# Patient Record
Sex: Male | Born: 1965
Health system: Southern US, Community
[De-identification: ages and names within clinical notes are randomized; demographics above are authoritative.]

## PROBLEM LIST (undated history)

## (undated) ENCOUNTER — Emergency Department (HOSPITAL_BASED_OUTPATIENT_CLINIC_OR_DEPARTMENT_OTHER): Admission: EM | Payer: Self-pay | Source: Home / Self Care

## (undated) DIAGNOSIS — K449 Diaphragmatic hernia without obstruction or gangrene: Secondary | ICD-10-CM

## (undated) DIAGNOSIS — K760 Fatty (change of) liver, not elsewhere classified: Secondary | ICD-10-CM

## (undated) DIAGNOSIS — M722 Plantar fascial fibromatosis: Secondary | ICD-10-CM

## (undated) DIAGNOSIS — E559 Vitamin D deficiency, unspecified: Secondary | ICD-10-CM

## (undated) DIAGNOSIS — U071 COVID-19: Secondary | ICD-10-CM

## (undated) DIAGNOSIS — R600 Localized edema: Secondary | ICD-10-CM

## (undated) DIAGNOSIS — R011 Cardiac murmur, unspecified: Secondary | ICD-10-CM

## (undated) DIAGNOSIS — K219 Gastro-esophageal reflux disease without esophagitis: Secondary | ICD-10-CM

## (undated) DIAGNOSIS — N529 Male erectile dysfunction, unspecified: Secondary | ICD-10-CM

## (undated) DIAGNOSIS — E782 Mixed hyperlipidemia: Secondary | ICD-10-CM

## (undated) DIAGNOSIS — R079 Chest pain, unspecified: Secondary | ICD-10-CM

## (undated) DIAGNOSIS — E669 Obesity, unspecified: Secondary | ICD-10-CM

## (undated) DIAGNOSIS — K635 Polyp of colon: Secondary | ICD-10-CM

## (undated) DIAGNOSIS — M779 Enthesopathy, unspecified: Secondary | ICD-10-CM

## (undated) DIAGNOSIS — G4733 Obstructive sleep apnea (adult) (pediatric): Secondary | ICD-10-CM

## (undated) DIAGNOSIS — R739 Hyperglycemia, unspecified: Secondary | ICD-10-CM

## (undated) DIAGNOSIS — R7303 Prediabetes: Secondary | ICD-10-CM

## (undated) DIAGNOSIS — K2100 Gastro-esophageal reflux disease with esophagitis, without bleeding: Secondary | ICD-10-CM

## (undated) DIAGNOSIS — K5790 Diverticulosis of intestine, part unspecified, without perforation or abscess without bleeding: Secondary | ICD-10-CM

## (undated) DIAGNOSIS — R0600 Dyspnea, unspecified: Secondary | ICD-10-CM

## (undated) DIAGNOSIS — I10 Essential (primary) hypertension: Secondary | ICD-10-CM

## (undated) DIAGNOSIS — K259 Gastric ulcer, unspecified as acute or chronic, without hemorrhage or perforation: Secondary | ICD-10-CM

## (undated) DIAGNOSIS — K5900 Constipation, unspecified: Secondary | ICD-10-CM

## (undated) DIAGNOSIS — E1169 Type 2 diabetes mellitus with other specified complication: Secondary | ICD-10-CM

## (undated) DIAGNOSIS — J1282 Pneumonia due to coronavirus disease 2019: Secondary | ICD-10-CM

## (undated) DIAGNOSIS — I509 Heart failure, unspecified: Secondary | ICD-10-CM

## (undated) DIAGNOSIS — M25519 Pain in unspecified shoulder: Secondary | ICD-10-CM

## (undated) DIAGNOSIS — R3911 Hesitancy of micturition: Secondary | ICD-10-CM

## (undated) DIAGNOSIS — M549 Dorsalgia, unspecified: Secondary | ICD-10-CM

## (undated) DIAGNOSIS — K648 Other hemorrhoids: Secondary | ICD-10-CM

## (undated) HISTORY — DX: Hyperglycemia, unspecified: R73.9

## (undated) HISTORY — DX: Dyspnea, unspecified: R06.00

## (undated) HISTORY — DX: Diaphragmatic hernia without obstruction or gangrene: K44.9

## (undated) HISTORY — DX: Polyp of colon: K63.5

## (undated) HISTORY — DX: Obstructive sleep apnea (adult) (pediatric): G47.33

## (undated) HISTORY — DX: Chest pain, unspecified: R07.9

## (undated) HISTORY — DX: Gastro-esophageal reflux disease without esophagitis: K21.9

## (undated) HISTORY — DX: Hesitancy of micturition: R39.11

## (undated) HISTORY — PX: UPPER GASTROINTESTINAL ENDOSCOPY: SHX188

## (undated) HISTORY — DX: Dorsalgia, unspecified: M54.9

## (undated) HISTORY — DX: Type 2 diabetes mellitus with other specified complication: E11.69

## (undated) HISTORY — DX: Prediabetes: R73.03

## (undated) HISTORY — DX: Fatty (change of) liver, not elsewhere classified: K76.0

## (undated) HISTORY — DX: Essential (primary) hypertension: I10

## (undated) HISTORY — DX: Constipation, unspecified: K59.00

## (undated) HISTORY — DX: Plantar fascial fibromatosis: M72.2

## (undated) HISTORY — DX: Pneumonia due to coronavirus disease 2019: J12.82

## (undated) HISTORY — PX: TONSILLECTOMY: SHX5217

## (undated) HISTORY — PX: FOOT TENDON TRANSFER: SHX1671

## (undated) HISTORY — DX: Vitamin D deficiency, unspecified: E55.9

## (undated) HISTORY — DX: Diverticulosis of intestine, part unspecified, without perforation or abscess without bleeding: K57.90

## (undated) HISTORY — DX: Gastro-esophageal reflux disease with esophagitis: K21.0

## (undated) HISTORY — DX: Localized edema: R60.0

## (undated) HISTORY — DX: COVID-19: U07.1

## (undated) HISTORY — DX: Male erectile dysfunction, unspecified: N52.9

## (undated) HISTORY — DX: Enthesopathy, unspecified: M77.9

## (undated) HISTORY — DX: Mixed hyperlipidemia: E78.2

## (undated) HISTORY — PX: UVULECTOMY: SHX2631

## (undated) HISTORY — PX: UMBILICAL HERNIA REPAIR: SHX196

## (undated) HISTORY — DX: Other hemorrhoids: K64.8

## (undated) HISTORY — DX: Obesity, unspecified: E66.9

## (undated) HISTORY — DX: Gastro-esophageal reflux disease with esophagitis, without bleeding: K21.00

## (undated) HISTORY — DX: Pain in unspecified shoulder: M25.519

## (undated) HISTORY — PX: INGUINAL HERNIA REPAIR: SUR1180

---

## 2005-02-01 ENCOUNTER — Ambulatory Visit: Payer: Self-pay | Admitting: Cardiology

## 2005-03-21 ENCOUNTER — Ambulatory Visit: Payer: Self-pay | Admitting: Cardiology

## 2008-01-13 ENCOUNTER — Ambulatory Visit (HOSPITAL_COMMUNITY): Admission: RE | Admit: 2008-01-13 | Discharge: 2008-01-13 | Payer: Self-pay | Admitting: Internal Medicine

## 2008-07-22 ENCOUNTER — Emergency Department (HOSPITAL_COMMUNITY): Admission: EM | Admit: 2008-07-22 | Discharge: 2008-07-22 | Payer: Self-pay | Admitting: Family Medicine

## 2009-10-04 DIAGNOSIS — I10 Essential (primary) hypertension: Secondary | ICD-10-CM | POA: Insufficient documentation

## 2009-10-04 DIAGNOSIS — E785 Hyperlipidemia, unspecified: Secondary | ICD-10-CM | POA: Insufficient documentation

## 2009-10-08 ENCOUNTER — Ambulatory Visit: Payer: Self-pay

## 2009-10-08 ENCOUNTER — Ambulatory Visit (HOSPITAL_COMMUNITY): Admission: RE | Admit: 2009-10-08 | Discharge: 2009-10-08 | Payer: Self-pay | Admitting: Internal Medicine

## 2009-10-08 ENCOUNTER — Ambulatory Visit: Payer: Self-pay | Admitting: Internal Medicine

## 2009-10-08 ENCOUNTER — Encounter: Payer: Self-pay | Admitting: Internal Medicine

## 2009-10-08 DIAGNOSIS — I10 Essential (primary) hypertension: Secondary | ICD-10-CM | POA: Insufficient documentation

## 2009-11-02 ENCOUNTER — Ambulatory Visit: Payer: Self-pay

## 2009-11-02 ENCOUNTER — Ambulatory Visit: Payer: Self-pay | Admitting: Internal Medicine

## 2010-01-05 ENCOUNTER — Ambulatory Visit: Payer: Self-pay | Admitting: Internal Medicine

## 2010-01-05 DIAGNOSIS — E669 Obesity, unspecified: Secondary | ICD-10-CM | POA: Insufficient documentation

## 2010-01-05 DIAGNOSIS — R0789 Other chest pain: Secondary | ICD-10-CM | POA: Insufficient documentation

## 2010-04-25 ENCOUNTER — Ambulatory Visit: Payer: Self-pay | Admitting: Internal Medicine

## 2010-04-25 DIAGNOSIS — M79671 Pain in right foot: Secondary | ICD-10-CM | POA: Insufficient documentation

## 2010-04-25 DIAGNOSIS — M79672 Pain in left foot: Secondary | ICD-10-CM

## 2010-05-06 ENCOUNTER — Telehealth: Payer: Self-pay | Admitting: Internal Medicine

## 2010-06-21 ENCOUNTER — Telehealth: Payer: Self-pay | Admitting: Internal Medicine

## 2010-10-09 LAB — CONVERTED CEMR LAB
ALT: 30 units/L (ref 0–53)
AST: 27 units/L (ref 0–37)
Alkaline Phosphatase: 61 units/L (ref 39–117)
Bilirubin, Direct: 0.1 mg/dL (ref 0.0–0.3)
Calcium: 9.5 mg/dL (ref 8.4–10.5)
GFR calc non Af Amer: 94.43 mL/min (ref >60)
Glucose, Bld: 92 mg/dL (ref 70–99)
LDL Cholesterol: 92 mg/dL (ref 0–99)
Sodium: 141 meq/L (ref 135–145)
Total CHOL/HDL Ratio: 3
VLDL: 12.6 mg/dL (ref 0.0–40.0)

## 2010-10-11 NOTE — Assessment & Plan Note (Signed)
Summary: new to be est, npx- jr   Vital Signs:  Patient profile:   45 year old male Height:      72 inches Weight:      229.50 pounds BMI:     31.24 O2 Sat:      98 % on Room air Temp:     98.5 degrees F oral Pulse rate:   76 / minute Pulse rhythm:   regular Resp:     16 per minute BP sitting:   136 / 90  (right arm) Cuff size:   large  Vitals Entered By: Glendell Docker CMA (January 05, 2010 9:48 AM)  O2 Flow:  Room air CC: Rm 2- New Patient Is Patient Diabetic? No Pain Assessment Patient in pain? yes     Location: chest Intensity: 6 Type: dull Onset of pain  Intermittent Comments Referral from Dr Anne Hahn, previous pcp out of Dr Carollee Massed of [facility]), co intermittent chest pain comes goes,last ov with cardiology about 2 weeks ago- EKG with Treadmill stress test   Primary Care Provider:  Dondra Spry DO  CC:  Rm 2- New Patient.  History of Present Illness: 45 y/o AA male to est.  he was prev followed by  - Dr. Lyn Hollingshead in [place]. he has hx of atypical chest pain he was seen by Dr. Gala Romney cardiac studies reviewed  Left ventricle: The cavity size was normal. Wall thickness was     normal. Systolic function was normal. The estimated ejection     fraction was in the range of 60% to 65%. Wall motion was normal;     there were no regional wall motion abnormalities. Features are     consistent with a pseudonormal left ventricular filling pattern,     with concomitant abnormal relaxation and increased filling pressure     (grade 2 diastolic dysfunction).      exercise stress test - negative  Preventive Screening-Counseling & Management  Alcohol-Tobacco     Alcohol drinks/day: 0     Smoking Status: quit     Packs/Day: <0.25     Year Started: 1987     Year Quit: 1996  Caffeine-Diet-Exercise     Caffeine use/day: 1 cup coffee every other day     Does Patient Exercise: no  Allergies (verified): No Known Drug  Allergies  Past History:  Past Surgical History: Tonsillectomy with uvula removal 2003  Family History: Mother dementia, DM2, HTN, CHF Father unknown Family History of Diabetes: Family History of Hypertension:  Siblings: Hypertension, GERD  No premature CAD colon ca, breast ca, prostate ca - no  Social History: Tobacco Use - No.  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   Head:  normocephalic and atraumatic.   Eyes:  pupils equal, pupils round, and pupils reactive to light.   Ears:  R ear normal and L ear normal.   Mouth:  prev UPPP,  Neck:  supple, no masses, and no carotid bruits.   Lungs:  normal respiratory effort, normal breath sounds, no crackles, and no wheezes.  Heart:  normal rate, regular rhythm, no murmur, and no gallop.   Abdomen:  soft, non-tender, normal bowel sounds, no masses, no hepatomegaly, and no splenomegaly.   Pulses:  dorsalis pedis and posterior tibial pulses are full and equal bilaterally Extremities:  No lower extremity edema  Neurologic:  cranial nerves II-XII intact and gait normal.   Psych:  normally interactive, good eye contact, not anxious appearing, and not depressed appearing.     Impression & Recommendations:  Problem # 1:  CHEST PAIN, ATYPICAL (ICD-786.59) Pt with atypical chest pain.  2 D Echo and treadmill stress test reassuring.  continue risk factor mgt  Problem # 2:  HYPERTENSION, BENIGN (ICD-401.1) Maintain current medication regimen.  screen for OSA  His updated medication list for this problem includes:    Lisinopril-hydrochlorothiazide 20-12.5 Mg Tabs  (Lisinopril-hydrochlorothiazide) .[name]... Take 1 tablet by mouth once a day    Amlodipine Besylate 10 Mg Tabs (Amlodipine besylate) .[name]... Take one tablet by mouth daily  BP today: 136/90 Prior BP: 133/84 (11/02/2009)  Prior 10 Yr Risk Heart Disease: 4 % (11/02/2009)  Labs Reviewed: K+: 3.6 (10/08/2009) Creat: : 1.1 (10/08/2009)   Chol: 149 (10/08/2009)   HDL: 44.30 (10/08/2009)   LDL: 92 (10/08/2009)   TG: 63.0 (10/08/2009)  Problem # 3:  HYPERLIPIDEMIA-MIXED (ICD-272.4) continue statin.  work on wt loss  His updated medication list for this problem includes:    Simvastatin 40 Mg Tabs (Simvastatin) .[name]... Take one tablet by mouth daily at bedtime  Labs Reviewed: SGOT: 27 (10/08/2009)   SGPT: 30 (10/08/2009)  Prior 10 Yr Risk Heart Disease: 4 % (11/02/2009)   HDL:44.30 (10/08/2009)  LDL:92 (10/08/2009)  Chol:149 (10/08/2009)  Trig:63.0 (10/08/2009)  Complete Medication List: 1)  Simvastatin 40 Mg Tabs (Simvastatin) .... Take one tablet by mouth daily at bedtime 2)  Nexium 40 Mg Cpdr (Esomeprazole magnesium) .... Take 1 capsule by mouth once a day 3)  Lisinopril-hydrochlorothiazide 20-12.5 Mg Tabs (Lisinopril-hydrochlorothiazide) .... Take 1 tablet by mouth once a day 4)  Amlodipine Besylate 10 Mg Tabs (Amlodipine besylate) .... Take one tablet by mouth daily 5)  Cialis 20 Mg Tabs (Tadalafil) .... Take 1 tablet by mouth once a day as needed 6)  Pennsaid 1.5 % Soln (Diclofenac sodium) .... Apply 10 drops two times a day  Patient Instructions: 1)  Please schedule a follow-up appointment in 3 months. 2)  Avoid concentrated sweets 3)  Avoid fruit juices and soft drinks 4)  Limit your carbohydrate intake to 30 grams per meal (100 grams per day) 5)  Take fish oil (omega 3 fatty acid) supplement daily (stop if it makes your heartburn worse) 6)  Start daily walking program if you can 7)  BMP prior to visit, ICD-9: 401.9 8)  HbgA1C prior to visit, ICD-9: 278.00 9)  Please return for lab  work one (1) week before your next appointment.  Prescriptions: CIALIS 20 MG TABS (TADALAFIL) Take 1 tablet by mouth once a day as needed  #10 x 2   Entered and Authorized by:   D. Thomos Lemons DO   Signed by:   D. Thomos Lemons DO on 01/05/2010   Method used:   Electronically to        [facility]* (retail)       [address]       [address] Shipping/mailing       [place], [place]  [address]       Ph: [phone]  Fax: [phone]   RxID:   [id number] AMLODIPINE BESYLATE 10 MG TABS (AMLODIPINE BESYLATE) Take one tablet by mouth daily  #90 x 1   Entered and Authorized by:   D. Thomos Lemons DO   Signed by:   D. Thomos Lemons DO on 01/05/2010   Method used:   Electronically to        [facility]* (retail)       [address]       [address] [place] Shipping/mailing       [place], [place]  [address]       Ph: [phone]       Fax: [phone]   RxID:   [id number] LISINOPRIL-HYDROCHLOROTHIAZIDE 20-12.5 MG TABS (LISINOPRIL-HYDROCHLOROTHIAZIDE) Take 1 tablet by mouth once a day  #90 x 1   Entered and Authorized by:   D. Thomos Lemons DO   Signed by:   D. Thomos Lemons DO on 01/05/2010   Method used:   Electronically to        [facility]* (retail)       [address]       [address] Shipping/mailing       [place], [place]  [address]       Ph: [phone]       Fax: [phone]   RxID:   [id number] NEXIUM 40 MG CPDR (ESOMEPRAZOLE MAGNESIUM) Take 1 capsule by mouth once a day  #90 x 1   Entered and Authorized by:   D. Thomos Lemons DO   Signed by:   D. Thomos Lemons DO on 01/05/2010   Method used:   Electronically to        [facility]* (retail)       [address]       [address] Shipping/mailing       [place], [place]  [address]       Ph: [phone]       Fax: [phone]   RxID:   [id number] SIMVASTATIN 40 MG TABS (SIMVASTATIN) Take one tablet by mouth daily at bedtime  #90 x 1    Entered and Authorized by:   D. Thomos Lemons DO   Signed by:   D. Thomos Lemons DO on 01/05/2010   Method used:   Electronically to        [facility]* (retail)       [address]       [address] [place] Shipping/mailing       [place], [place]  [address]       Ph: [phone]       Fax: [phone]   RxID:   [id number]    Immunization History:  Tetanus/Td Immunization History:    Tetanus/Td:  historical (01/01/2007)  Influenza Immunization History:    Influenza:  declined (01/05/2010)     Current Allergies (reviewed today): No known allergies

## 2010-10-11 NOTE — Progress Notes (Signed)
Summary: Voltaren Clarification  Phone Note From Pharmacy   Caller: [facility]* Call For: Dr Artist Pais  Summary of Call: fax received from [facility] pharmacy for clarification on Voltaren Gel. They are wanting to know if a solution was meant to be prescribed for the patient Initial call taken by: Glendell Docker CMA,  May 06, 2010 8:17 AM  Follow-up for Phone Call        rx was suppose to be for voltaren gel Follow-up by: D. Thomos Lemons DO,  May 06, 2010 5:10 PM  Additional Follow-up for Phone Call Additional follow up Details #1::        Notified pharmacist rx should have been for the gel. Nicki Guadalajara Fergerson CMA Duncan Dull)  May 09, 2010 1:26 PM

## 2010-10-11 NOTE — Progress Notes (Signed)
Summary: Ibuprofen Refill  Phone Note Refill Request Message from:  Fax from Pharmacy on June 21, 2010 11:05 AM  Refills Requested: Medication #1:  ibuprofen 800 mg tablet   Brand Name Necessary? No   Supply Requested: 3 months   Last Refilled: 09/24/2008  Method Requested: Electronic Next Appointment Scheduled: none Initial call taken by: Roselle Locus,  June 21, 2010 11:05 AM  Follow-up for Phone Call        call placed to patient at [phone], voice recording reached stating" the person you are trying to reach has exceeded their voice message boxcapacity, please try your call again later" Glendell Docker [facility]  June 21, 2010 11:33 AM   Additional Follow-up for Phone Call Additional follow up Details #1::        attempted to contact patient at  [phone], no answer voice recording reached with previous message repeated. call placed to pharmacy [phone] at [facility], spoke with pharmacist Thayer Ohm he was advised the rx has been denied to have patient contact office. Additional Follow-up by: Glendell Docker CMA,  June 22, 2010 8:07 AM    Additional Follow-up for Phone Call Additional follow up Details #2::    no return call from patient regardigng rx request Follow-up by: Glendell Docker CMA,  June 23, 2010 8:32 AM

## 2010-10-11 NOTE — Assessment & Plan Note (Signed)
Summary: OK PER HEATHER/D.MILLER   Primary Provider:  Carollee Massed, [place]   History of Present Illness: 45 y/o male with HTN, HL, OSA s/p UPPP. Presents for evaluation of CP.  Very active. No known h/o heart disease. No previous stress test or cath, Over past month has had episdoes of central chest pain/pressure. mostly while driving. no associated symptoms. Not worse with exercise or movement. Has also noticed lump under his left breast but pain not associated with this.  No orthopnea, PND or HF symptoms. Continues to snore. not exercising routinely currently.  Had echo today EF 60% no RWMA. grade 2 diastolic dysfunction. (which I read personally)  Current Medications (verified): 1)  Metoprolol Succinate 50 Mg Xr24h-Tab (Metoprolol Succinate) .... Take One Tablet By Mouth Daily 2)  Vytorin 10-20 Mg Tabs (Ezetimibe-Simvastatin) .... Take One Tablet By Mouth Daily At Bedtime 3)  Nexium 40 Mg Cpdr (Esomeprazole Magnesium) .... Once Daily 4)  Lisinopril-Hydrochlorothiazide 20-12.5 Mg Tabs (Lisinopril-Hydrochlorothiazide) .... Once Daily 5)  Phentermine Hcl 37.5 Mg Tabs (Phentermine Hcl) .... As Needed  Allergies (verified): No Known Drug Allergies  Past History:  Family History: Last updated: 10/08/2009 Mother dementia, DM2, HTN, CHF Father unknown Family History of Diabetes:  Family History of Hypertension:  Siblings: Hypertension, GERD  No premature CAD  Social History: Last updated: 10/04/2009 Tobacco Use - No.  Full Time Married  Alcohol Use - no Regular Exercise - yes Drug Use - no  Risk Factors: Exercise: yes (10/04/2009)  Risk Factors: Smoking Status: never (10/04/2009)  Past Medical History: 1. HYPERLIPIDEMIA-MIXED   2. HYPERTENSION, UNSPECIFIED  3. hernia 4. bone spurs (L foot) 5. OSA s/p UPPP  Family History: Reviewed history from 10/04/2009 and no changes required. Mother dementia, DM2, HTN, CHF Father unknown Family History of  Diabetes:  Family History of Hypertension:  Siblings: Hypertension, GERD  No premature CAD  Social History: Reviewed history from 10/04/2009 and no changes required. Tobacco Use - No.  Full Time Married  Alcohol Use - no Regular Exercise - yes Drug Use - no  Review of Systems       As per HPI and past medical history; otherwise all systems negative.   Vital Signs:  Patient profile:   45 year old male Height:      72 inches Weight:      234 pounds BMI:     31.85 Pulse rate:   54 / minute BP sitting:   162 / 98  (right arm) Cuff size:   large  Vitals Entered By: Hardin Negus, RMA (October 08, 2009 9:15 AM)  Physical Exam  General:  Gen: well appearing. no resp difficulty HEENT: normal Neck: supple. no JVD. Carotids 2+ bilat; no bruits. No lymphadenopathy or thryomegaly appreciated. Cor: PMI nondisplaced. Regular rate & rhythm. No rubs, gallops, murmur. Chest wall: subcutaneous linear mobile nodule on left breast Lungs: clear Abdomen: soft, nontender, nondistended. No hepatosplenomegaly. No bruits or masses. Good bowel sounds. Extremities: no cyanosis, clubbing, rash, edema Neuro: alert & orientedx3, cranial nerves grossly intact. moves all 4 extremities w/o difficulty. affect pleasant    Impression & Recommendations:  Problem # 1:  CHEST TIGHTNESS-PRESSURE-OTHER ([id number]) Typical and atypical features. + risk factors. will proceed with treadmill test,  Problem # 2:  HYPERTENSION, BENIGN (ICD-401.1) Significantly elevated Will add norvasc 5mg  per day. Titrate to 10 in 2 weeks. Keep BP log. Weight loss with diet and exercise. Eventually would like to d/c metoprolol and HCTZ in favor of spironolactone.   Problem #  3:  HYPERLIPIDEMIA-MIXED (ICD-272.4) Wants to stop vytorin due to cost. Will switch to simva 40. Check lipids today.  Problem # 4:  Chest nodule Have discussed with radiology, they suggest CT to further evlauate.  Other Orders: EKG w/  Interpretation (93000) TLB-BMP (Basic Metabolic Panel-BMET) (80048-METABOL) TLB-Hepatic/Liver Function Pnl (80076-HEPATIC) TLB-Lipid Panel (80061-LIPID) CT Scan  (CT Scan) Treadmill (Treadmill)  Patient Instructions: 1)  Stop Vytorin 2)  Start Simvastatin 40mg  daily 3)  Start Amlodipine 5mg  daily 4)  Labs today 5)  Your physician has requested that you have an exercise tolerance test.  For further information please visit [email or web address]  Please also follow instruction sheet, as given. 6)  Non-Cardiac CT scanning, (CAT scanning), is a noninvasive, special x-ray that produces cross-sectional images of the body using x-rays and a computer. CT scans help physicians diagnose and treat medical conditions. For some CT exams, a contrast material is used to enhance visibility in the area of the body being studied. CT scans provide greater clarity and reveal more details than regular x-ray exams. 7)  Follow up in 1 month Prescriptions: AMLODIPINE BESYLATE 5 MG TABS (AMLODIPINE BESYLATE) Take one tablet by mouth daily  #30 x 6   Entered by:   Meredith Staggers, RN   Authorized by:   Dolores Patty, MD, [facility]   Signed by:   Meredith Staggers, RN on 10/08/2009   Method used:   Electronically to        [facility]* (retail)       [address]       [address] Shipping/mailing       [place], [place]  [address]       Ph: [phone]       Fax: [phone]   RxID:   [id number] SIMVASTATIN 40 MG TABS (SIMVASTATIN) Take one tablet by mouth daily at bedtime  #30 x 6   Entered by:   Meredith Staggers, RN   Authorized by:   Dolores Patty, MD, [facility]   Signed by:   Meredith Staggers, RN on 10/08/2009   Method used:   Electronically to        [facility]* (retail)       [address]       [address] Shipping/mailing       [place], [place]  [address]       Ph: [phone]       Fax: [phone]   RxID:   [id number]

## 2010-10-11 NOTE — Assessment & Plan Note (Signed)
Summary: foot pain/mhf   Vital Signs:  Patient profile:   46 year old male Weight:      228.25 pounds BMI:     31.07 O2 Sat:      98 % on Room air Temp:     98.3 degrees F oral Pulse rate:   59 / minute Pulse rhythm:   regular Resp:     16 per minute BP sitting:   122 / 82  (right arm) Cuff size:   large  Vitals Entered By: Glendell Docker CMA (April 25, 2010 1:08 PM)  O2 Flow:  Room air CC: Rt Foot Pain Is Patient Diabetic? No Pain Assessment Patient in pain? yes     Location: foot Intensity: 8 Type: sharp &=Stabbing Comments c/ o rogh tfoot sharp stabbing sensation for the past 2 weeks, requesting a rx for 800 mg Ibuprofen   Primary Care Andra Matsuo:  Dondra Spry DO  CC:  Rt Foot Pain.  History of Present Illness: 45 y/o AA male c/o significant right heel pain 2 yrs of on and off right heel pain.   prev eval at urgent care. no improvement with PT / stretching no recent injury pain with walking  Preventive Screening-Counseling & Management  Alcohol-Tobacco     Smoking Status: quit  Allergies (verified): No Known Drug Allergies  Past History:  Past Medical History: 1. HYPERLIPIDEMIA-MIXED   2. HYPERTENSION, UNSPECIFIED  3. hernia 4. bone spurs (L foot)  5. OSA s/p UPPP  Physical Exam  General:  alert, well-developed, and well-nourished.   Lungs:  normal respiratory effort, normal breath sounds, no crackles, and no wheezes.   Heart:  normal rate, regular rhythm, no murmur, and no gallop.   Msk:  tenderness of achilles tendon - calcaneus no redness   Impression & Recommendations:  Problem # 1:  HEEL PAIN, RIGHT (ICD-729.5) 45 y/o with exacerbation of right heel pain.  prob achilles tendinitis vs partial tear.  refer to Dr Victorino Dike for further eval and tx pt advised to avoid strenous activity use voltaren gel Orders: Orthopedic Referral (Ortho)  Complete Medication List: 1)  Simvastatin 40 Mg Tabs (Simvastatin) .... Take one tablet by mouth  daily at bedtime 2)  Nexium 40 Mg Cpdr (Esomeprazole magnesium) .... Take 1 capsule by mouth once a day 3)  Lisinopril-hydrochlorothiazide 20-12.5 Mg Tabs (Lisinopril-hydrochlorothiazide) .... Take 1 tablet by mouth once a day 4)  Amlodipine Besylate 10 Mg Tabs (Amlodipine besylate) .... Take one tablet by mouth daily 5)  Cialis 20 Mg Tabs (Tadalafil) .... Take 1 tablet by mouth once a day as needed 6)  Voltaren 0.1 % Soln (Diclofenac sodium) .... Apply 2 grams to right ankle qid  Patient Instructions: 1)  Our office will contact you re:  orthopedic referral Prescriptions: VOLTAREN 0.1 % SOLN (DICLOFENAC SODIUM) apply 2 grams to right ankle qid  #3 pk x 1   Entered and Authorized by:   D. Thomos Lemons DO   Signed by:   D. Thomos Lemons DO on 04/25/2010   Method used:   Electronically to        [facility]* (retail)       [address]       [address] Shipping/mailing       [place], [place]  [address]       Ph: [phone]       Fax: [phone]   RxID:   [id number]   Current Allergies (reviewed today): No known allergies

## 2010-12-02 ENCOUNTER — Telehealth: Payer: Self-pay | Admitting: Internal Medicine

## 2010-12-02 MED ORDER — SIMVASTATIN 40 MG PO TABS: 40.0000 mg | ORAL_TABLET | Freq: Every day | ORAL | Status: DC

## 2010-12-02 MED ORDER — LISINOPRIL-HYDROCHLOROTHIAZIDE 20-12.5 MG PO TABS: 1.0000 | ORAL_TABLET | Freq: Every day | ORAL | Status: DC

## 2010-12-02 MED ORDER — ESOMEPRAZOLE MAGNESIUM 40 MG PO CPDR: 40.0000 mg | DELAYED_RELEASE_CAPSULE | Freq: Every day | ORAL | Status: DC

## 2010-12-02 NOTE — Telephone Encounter (Addendum)
OK for 1 refill.   Needs following labs before OV BMET 401.9  FLP, LFTs 272.4

## 2010-12-02 NOTE — Telephone Encounter (Signed)
Attempted to reach pt and schedule appointment but voicemail is full. Unable to leave message. 30 day supply only, sent to pharmacy.

## 2010-12-02 NOTE — Telephone Encounter (Signed)
Pt wife called stating that pt needs refills on nexium, lisinopril, and simvastatin. Please assist.

## 2010-12-02 NOTE — Telephone Encounter (Signed)
Previous refills printed in office and did not go through electronically. Called pharmacy and left refills on voicemail.

## 2010-12-05 ENCOUNTER — Encounter: Payer: Self-pay | Admitting: *Deleted

## 2010-12-05 NOTE — Telephone Encounter (Signed)
Attempted to reach pt re: need for labs and appt. Voice mailbox is full, unable to leave message. Contact letter mailed to pt.

## 2010-12-31 ENCOUNTER — Encounter: Payer: Self-pay | Admitting: Internal Medicine

## 2011-01-04 ENCOUNTER — Encounter: Payer: Self-pay | Admitting: Internal Medicine

## 2011-01-04 ENCOUNTER — Ambulatory Visit (INDEPENDENT_AMBULATORY_CARE_PROVIDER_SITE_OTHER): Payer: 59 | Admitting: Internal Medicine

## 2011-01-04 VITALS — BP 132/82 | HR 74 | Resp 18 | Ht 72.0 in | Wt 240.0 lb

## 2011-01-04 DIAGNOSIS — E785 Hyperlipidemia, unspecified: Secondary | ICD-10-CM

## 2011-01-04 DIAGNOSIS — Z125 Encounter for screening for malignant neoplasm of prostate: Secondary | ICD-10-CM

## 2011-01-04 DIAGNOSIS — Z0000 Encounter for general adult medical examination without abnormal findings: Secondary | ICD-10-CM

## 2011-01-04 DIAGNOSIS — I10 Essential (primary) hypertension: Secondary | ICD-10-CM

## 2011-01-04 MED ORDER — AMLODIPINE BESYLATE 10 MG PO TABS: 10.0000 mg | ORAL_TABLET | Freq: Every day | ORAL | Status: DC

## 2011-01-04 MED ORDER — LISINOPRIL-HYDROCHLOROTHIAZIDE 20-12.5 MG PO TABS: 1.0000 | ORAL_TABLET | Freq: Every day | ORAL | Status: DC

## 2011-01-04 MED ORDER — SIMVASTATIN 10 MG PO TABS: 10.0000 mg | ORAL_TABLET | Freq: Every day | ORAL | Status: DC

## 2011-01-04 NOTE — Assessment & Plan Note (Signed)
BP looks good. If trending up can double lisinopril/HCTZ. Continue exercise program. Agree with weight loss efforts. Discussed [facility] Diet and prtion control.

## 2011-01-04 NOTE — Patient Instructions (Signed)
Goal weight loss 20-30 lbs  [email or web address] Our office will contact you re: blood test results Avoid concentrated sweets and decrease your intake of carbohydrates

## 2011-01-04 NOTE — Progress Notes (Signed)
Subjective:    Patient ID: [name] Foster, male    DOB: 1966-07-09, 45 y.o.   MRN: [id number]  HPI  45 y/o male for routine cpx and follow up No significant interval hx  Hyperlipidemia - pt ran out of cholesterol medication  htn - stable   Review of Systems  Constitutional: weight gain  Eyes: Negative for visual disturbance.  Respiratory: Negative for cough, chest tightness and shortness of breath.   Cardiovascular: Negative for chest pain.  Genitourinary: Negative for difficulty urinating.  Neurological: Negative for headaches.  Gastrointestinal: Negative for abdominal pain, heartburn melena or hematochezia Psych: Negative for depression or anxiety Endo:  No polyuria or polydypsia        Past Medical History  Diagnosis Date  . Mixed hyperlipidemia   . Hypertension   . Bone spur     left foot  . OSA (obstructive sleep apnea)     s/p UPPP    History   Social History  . Marital Status: Married    Spouse Name: N/A    Number of Children: N/A  . Years of Education: N/A   Occupational History  . Not on file.   Social History Main Topics  . Smoking status: Former Games developer  . Smokeless tobacco: Not on file  . Alcohol Use: No  . Drug Use: Not on file  . Sexually Active: Not on file   Other Topics Concern  . Not on file   Social History Narrative   Tobacco Use - No. 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 Tonsillectomy     Family History  Problem Relation Age of Onset  . Dementia Mother   . Diabetes Mother   . Hypertension Mother   . Heart failure Mother   . Other Father     unknown  . Hypertension      siblings  . Other      no premature cad, colon ca, breast ca, or prostate ca    No  Known Allergies  Current Outpatient Prescriptions on File Prior to Visit  Medication Sig Dispense Refill  . amLODipine (NORVASC) 10 MG tablet Take 10 mg by mouth daily.        . diclofenac sodium (VOLTAREN) 1 % GEL Apply 1 application topically. Apply 2 grams to right ankle four times daily       . esomeprazole (NEXIUM) 40 MG capsule Take 1 capsule (40 mg total) by mouth daily.  30 capsule  0  . lisinopril-hydrochlorothiazide (ZESTORETIC) 20-12.5 MG per tablet Take 1 tablet by mouth daily.  30 tablet  0  . simvastatin (ZOCOR) 40 MG tablet Take 1 tablet (40 mg total) by mouth daily.  30 tablet  0  . tadalafil (CIALIS) 20 MG tablet Take 20 mg by mouth daily as needed.          BP 124/90  Pulse 67  Temp(Src) 97.9 F (36.6 C) (Oral)  Resp 18  Ht 6' (1.829 m)  Wt 239 lb (108.41 kg)  BMI 32.41 kg/m2  SpO2 100%    Objective:   Physical Exam     Constitutional: Appears well-developed and well-nourished. No distress.  HENT:  Head: Normocephalic and atraumatic.  Right Ear: External  ear normal.  Left Ear: External ear normal.  Mouth/Throat: Oropharynx is clear and moist. prev UPPP Eyes: Conjunctivae are normal. Pupils are equal, round, and reactive to light.  Neck: Normal range of motion. Neck supple. No thyromegaly present.       No carotid bruit  Cardiovascular: Normal rate, regular rhythm and normal heart sounds.  Exam reveals no gallop and no friction rub.   No murmur heard. Pulmonary/Chest: Effort normal and breath sounds normal.  No wheezes. No rales.  Abdominal: Soft. Bowel sounds are normal. No mass. There is no tenderness.  Neurological: Alert. No cranial nerve deficit.  Skin: Skin is warm and dry.  Psychiatric: Normal mood and affect. Behavior is normal.      Assessment & Plan:

## 2011-01-04 NOTE — Progress Notes (Signed)
HPI:  [name] Foster is a 45 y/o male (husband of Sharonda in our office) with HTN, HL, OSA s/p UPPP.   We saw him last year for evaluation for CP. Echo showed EF 60%. No RWMA. grade 2 diastolic dysfunction. ETT was normal 2/11 (12:00 on Bruce)  Returns for routine f/u.  Doing well. Going to gym regularly 4x/week. Doing 30-60 mins. @ 7-12% grade. HR 145-150. No CP or undue dyspnea.   Saw Dr. Artist Pais this am and weight loss recommended. Lipids checked today.    ROS: All systems negative except as listed in HPI, PMH and Problem List.  Past Medical History  Diagnosis Date  . Mixed hyperlipidemia   . Hypertension   . Bone spur     left foot  . OSA (obstructive sleep apnea)     s/p UPPP    Current Outpatient Prescriptions  Medication Sig Dispense Refill  . amLODipine (NORVASC) 10 MG tablet Take 1 tablet (10 mg total) by mouth daily.  90 tablet  1  . esomeprazole (NEXIUM) 40 MG capsule Take 1 capsule (40 mg total) by mouth daily.  30 capsule  0  . lisinopril-hydrochlorothiazide (ZESTORETIC) 20-12.5 MG per tablet Take 1 tablet by mouth daily.  90 tablet  1  . simvastatin (ZOCOR) 10 MG tablet Take 1 tablet (10 mg total) by mouth daily.  90 tablet  1  . tadalafil (CIALIS) 20 MG tablet Take 20 mg by mouth daily as needed.        [name] DISCONTD: amLODipine (NORVASC) 10 MG tablet Take 10 mg by mouth daily.        [name] DISCONTD: diclofenac sodium (VOLTAREN) 1 % GEL Apply 1 application topically. Apply 2 grams to right ankle four times daily       . DISCONTD: lisinopril-hydrochlorothiazide (ZESTORETIC) 20-12.5 MG per tablet Take 1 tablet by mouth daily.  30 tablet  0  . DISCONTD: simvastatin (ZOCOR) 40 MG tablet Take 1 tablet (40 mg total) by mouth daily.  30 tablet  0     PHYSICAL EXAM: Filed Vitals:   01/04/11 1126  BP: 138/88  Pulse: 74  Resp: 18   General:  Well appearing. Muscular No resp difficulty HEENT: normal Neck: supple. JVP flat. Carotids 2+ bilaterally; no bruits. No lymphadenopathy or  thryomegaly appreciated. Cor: PMI normal. Regular rate & rhythm. No rubs, gallops or murmurs. Lungs: clear Abdomen: soft, nontender, nondistended. No hepatosplenomegaly. No bruits or masses. Good bowel sounds. Extremities: no cyanosis, clubbing, rash, edema Neuro: alert & orientedx3, cranial nerves grossly intact. Moves all 4 extremities w/o difficulty. Affect pleasant.    ECG: Sinus arrhythmia 65 No ST-T wave abnormalities. 1AVB.     ASSESSMENT & PLAN:

## 2011-01-04 NOTE — Assessment & Plan Note (Signed)
Followed by Dr. Artist Pais. Continue simvastatin. Goal LDL < 100.

## 2011-01-05 LAB — BASIC METABOLIC PANEL WITH GFR
Calcium: 10.2 mg/dL (ref 8.4–10.5)
Creat: 0.98 mg/dL (ref 0.40–1.50)
GFR, Est African American: >60 mL/min (ref >60)
GFR, Est Non African American: >60 mL/min (ref >60)

## 2011-01-05 LAB — LIPID PANEL
HDL: 40 mg/dL (ref >39)
LDL Cholesterol: 181 mg/dL — ABNORMAL HIGH (ref 0–99)

## 2011-01-05 LAB — HEMOGLOBIN A1C: Hgb A1c MFr Bld: 5.7 % — ABNORMAL HIGH (ref <5.7)

## 2011-01-05 LAB — PSA: PSA: 2.21 ng/mL (ref <=4.00)

## 2011-01-05 LAB — HIGH SENSITIVITY CRP: CRP, High Sensitivity: 2.6 mg/L

## 2011-01-09 ENCOUNTER — Encounter: Payer: Self-pay | Admitting: *Deleted

## 2011-01-25 DIAGNOSIS — Z0000 Encounter for general adult medical examination without abnormal findings: Secondary | ICD-10-CM | POA: Insufficient documentation

## 2011-01-25 NOTE — Assessment & Plan Note (Signed)
Reviewed adult health maintenance protocols.  We discussed colon cancer screening starting at age 43. Pt uptodate with Tdap

## 2011-01-25 NOTE — Assessment & Plan Note (Signed)
BP: 124/90 mmHg  Stable.  Continue current medication regimen. Lab Results  Component Value Date   CREATININE 0.98 01/04/2011

## 2011-01-25 NOTE — Assessment & Plan Note (Signed)
Deteriorated. I urged medication and dietary compliance Lab Results  Component Value Date   CHOL 247* 01/04/2011   CHOL 149 10/08/2009   Lab Results  Component Value Date   HDL 40 01/04/2011   HDL 44.30 10/08/2009   Lab Results  Component Value Date   LDLCALC 181* 01/04/2011   LDLCALC 92 10/08/2009   Lab Results  Component Value Date   TRIG 131 01/04/2011   TRIG 63.0 10/08/2009   Lab Results  Component Value Date   CHOLHDL 6.2 01/04/2011   CHOLHDL 3 10/08/2009   No results found for this basename: LDLDIRECT

## 2011-04-05 ENCOUNTER — Ambulatory Visit: Payer: 59 | Admitting: Internal Medicine

## 2011-04-29 ENCOUNTER — Emergency Department (HOSPITAL_BASED_OUTPATIENT_CLINIC_OR_DEPARTMENT_OTHER)
Admission: EM | Admit: 2011-04-29 | Discharge: 2011-04-29 | Disposition: A | Discharge status: Home / Self Care | Payer: 59 | Attending: Emergency Medicine | Admitting: Emergency Medicine

## 2011-04-29 ENCOUNTER — Other Ambulatory Visit: Payer: Self-pay

## 2011-04-29 ENCOUNTER — Encounter (HOSPITAL_BASED_OUTPATIENT_CLINIC_OR_DEPARTMENT_OTHER): Payer: Self-pay | Admitting: *Deleted

## 2011-04-29 ENCOUNTER — Emergency Department (INDEPENDENT_AMBULATORY_CARE_PROVIDER_SITE_OTHER): Payer: 59

## 2011-04-29 DIAGNOSIS — I10 Essential (primary) hypertension: Secondary | ICD-10-CM | POA: Insufficient documentation

## 2011-04-29 DIAGNOSIS — M546 Pain in thoracic spine: Secondary | ICD-10-CM | POA: Insufficient documentation

## 2011-04-29 DIAGNOSIS — R079 Chest pain, unspecified: Secondary | ICD-10-CM | POA: Insufficient documentation

## 2011-04-29 DIAGNOSIS — S30860A Insect bite (nonvenomous) of lower back and pelvis, initial encounter: Secondary | ICD-10-CM | POA: Insufficient documentation

## 2011-04-29 DIAGNOSIS — W57XXXA Bitten or stung by nonvenomous insect and other nonvenomous arthropods, initial encounter: Secondary | ICD-10-CM

## 2011-04-29 DIAGNOSIS — K219 Gastro-esophageal reflux disease without esophagitis: Secondary | ICD-10-CM | POA: Insufficient documentation

## 2011-04-29 DIAGNOSIS — E785 Hyperlipidemia, unspecified: Secondary | ICD-10-CM | POA: Insufficient documentation

## 2011-04-29 DIAGNOSIS — T148 Other injury of unspecified body region: Secondary | ICD-10-CM

## 2011-04-29 DIAGNOSIS — G4733 Obstructive sleep apnea (adult) (pediatric): Secondary | ICD-10-CM | POA: Insufficient documentation

## 2011-04-29 HISTORY — DX: Gastric ulcer, unspecified as acute or chronic, without hemorrhage or perforation: K25.9

## 2011-04-29 HISTORY — DX: Cardiac murmur, unspecified: R01.1

## 2011-04-29 LAB — BASIC METABOLIC PANEL
Calcium: 10.4 mg/dL (ref 8.4–10.5)
Chloride: 102 mEq/L (ref 96–112)
Creatinine, Ser: 1 mg/dL (ref 0.50–1.35)
GFR calc Af Amer: >60 mL/min (ref >60)
GFR calc non Af Amer: >60 mL/min (ref >60)

## 2011-04-29 LAB — DIFFERENTIAL
Basophils Absolute: 0 10*3/uL (ref 0.0–0.1)
Basophils Relative: 0 % (ref 0–1)
Eosinophils Absolute: 0 10*3/uL (ref 0.0–0.7)
Monocytes Absolute: 0.4 10*3/uL (ref 0.1–1.0)
Neutro Abs: 2.1 10*3/uL (ref 1.7–7.7)

## 2011-04-29 LAB — CBC
HCT: 43.7 % (ref 39.0–52.0)
MCH: 28.8 pg (ref 26.0–34.0)
MCHC: 35.2 g/dL (ref 30.0–36.0)
RDW: 13.5 % (ref 11.5–15.5)

## 2011-04-29 LAB — TROPONIN I: Troponin I: <0.3 ng/mL (ref <0.30)

## 2011-04-29 LAB — CK TOTAL AND CKMB (NOT AT ARMC): Relative Index: 0.6 (ref 0.0–2.5)

## 2011-04-29 NOTE — ED Notes (Signed)
No old EKG on the patient. NP Rubin Payor was told of this when shown the new EKG.

## 2011-04-29 NOTE — ED Notes (Signed)
Patient states he was bitten by an unknown insect approximately one week ago.  Has treated with rubbing alcohol.  States bites were initially the size of a dime.  Minimal redness at site, itchy.  States approximately 3 days ago he developed chest aching , feels like a chest cold, denies any recent illness except insect bites.

## 2011-04-29 NOTE — ED Provider Notes (Signed)
History     CSN: [id number] Arrival date & time: 04/29/2011 11:27 AM  Chief Complaint  Patient presents with  . Insect Bite    Chest discomfort after being bite by insects.     HPI Comments: Pt state that it all started after he was laying on the ground working on a car and got bitten multiple times by some insect:pt state that the bites were swollen and red, but have gotten better after using rubbing etoh  Patient is a 45 y.o. male presenting with chest pain. The history is provided by the patient. No language interpreter was used.  Chest Pain The chest pain began 3 - 5 days ago. Chest pain occurs constantly. The chest pain is unchanged. At its most intense, the pain is at 4/10. The pain is currently at 1/10. The severity of the pain is mild. The quality of the pain is described as aching. The pain radiates to the upper back. Primary symptoms include shortness of breath and dizziness. Pertinent negatives for primary symptoms include no fever, no fatigue, no syncope, no wheezing, no palpitations, no abdominal pain, no nausea and no vomiting.  Dizziness does not occur with nausea, vomiting, weakness or diaphoresis.   Pertinent negatives for associated symptoms include no diaphoresis, no lower extremity edema, no near-syncope, no orthopnea, no paroxysmal nocturnal dyspnea and no weakness. He tried nothing for the symptoms. Risk factors include male gender.  Pertinent negatives for past medical history include no hyperlipidemia and no hypertension.     Past Medical History  Diagnosis Date  . Mixed hyperlipidemia   . Hypertension   . Bone spur     left foot  . OSA (obstructive sleep apnea)     s/p UPPP  . Murmur   . Acid reflux   . Stomach ulcer     Past Surgical History  Procedure Date  . Tonsillectomy     Family History  Problem Relation Age of Onset  . Dementia Mother   . Diabetes Mother   . Hypertension Mother   . Heart failure Mother   . Other Father     unknown  .  Hypertension      siblings  . Other      no premature cad, colon ca, breast ca, or prostate ca    History  Substance Use Topics  . Smoking status: Never Smoker   . Smokeless tobacco: Not on file  . Alcohol Use: No      Review of Systems  Constitutional: Negative for fever, diaphoresis and fatigue.  Respiratory: Positive for shortness of breath. Negative for wheezing.   Cardiovascular: Positive for chest pain. Negative for palpitations, orthopnea, syncope and near-syncope.  Gastrointestinal: Negative for nausea, vomiting and abdominal pain.  Neurological: Positive for dizziness. Negative for weakness.  All other systems reviewed and are negative.    Physical Exam  BP 126/80  Pulse 57  Temp(Src) 98.3 F (36.8 C) (Oral)  Resp 18  Ht 6' (1.829 m)  Wt 240 lb (108.863 kg)  BMI 32.55 kg/m2  SpO2 100%  Physical Exam  Nursing note and vitals reviewed. Constitutional: He appears well-developed and well-nourished.  Cardiovascular: Normal rate and regular rhythm.   Pulmonary/Chest: Effort normal and breath sounds normal.  Abdominal: Soft. Bowel sounds are normal.  Musculoskeletal: Normal range of motion.  Neurological: He is alert.  Skin:       Pt has multiple small raised areas to back without any vesicles or drainage to the area  ED Course  Procedures Results for orders placed during the hospital encounter of 04/29/11  CBC      Component Value Range   WBC 4.4  4.0 - 10.5 (K/uL)   RBC 5.35  4.22 - 5.81 (MIL/uL)   Hemoglobin 15.4  13.0 - 17.0 (g/dL)   HCT 40.9  81.1 - 91.4 (%)   MCV 81.7  78.0 - 100.0 (fL)   MCH 28.8  26.0 - 34.0 (pg)   MCHC 35.2  30.0 - 36.0 (g/dL)   RDW 78.2  95.6 - 21.3 (%)   Platelets 211  150 - 400 (K/uL)  DIFFERENTIAL      Component Value Range   Neutrophils Relative 48  43 - 77 (%)   Neutro Abs 2.1  1.7 - 7.7 (K/uL)   Lymphocytes Relative 42  12 - 46 (%)   Lymphs Abs 1.9  0.7 - 4.0 (K/uL)   Monocytes Relative 9  3 - 12 (%)   Monocytes  Absolute 0.4  0.1 - 1.0 (K/uL)   Eosinophils Relative 1  0 - 5 (%)   Eosinophils Absolute 0.0  0.0 - 0.7 (K/uL)   Basophils Relative 0  0 - 1 (%)   Basophils Absolute 0.0  0.0 - 0.1 (K/uL)  BASIC METABOLIC PANEL      Component Value Range   Sodium 139  135 - 145 (mEq/L)   Potassium 4.1  3.5 - 5.1 (mEq/L)   Chloride 102  96 - 112 (mEq/L)   CO2 27  19 - 32 (mEq/L)   Glucose, Bld 96  70 - 99 (mg/dL)   BUN 14  6 - 23 (mg/dL)   Creatinine, Ser 0.86  0.50 - 1.35 (mg/dL)   Calcium 57.8  8.4 - 10.5 (mg/dL)   GFR calc non Af Amer >60  >60 (mL/min)   GFR calc Af Amer >60  >60 (mL/min)  TROPONIN I      Component Value Range   Troponin I <0.30  <0.30 (ng/mL)  CK TOTAL AND CKMB      Component Value Range   Total CK 903 (*) 7 - 232 (U/L)   CK, MB 5.3 (*) 0.3 - 4.0 (ng/mL)   Relative Index 0.6  0.0 - 2.5    Dg Chest 2 View  04/29/2011  *RADIOLOGY REPORT*  Clinical Data: Chest pain, insect bite  CHEST - 2 VIEW  Comparison: Chest CT 10/08/2009  Findings: Cardiomediastinal silhouette is within normal limits. The lungs are clear. No pleural effusion.  No pneumothorax.  No acute osseous abnormality.  IMPRESSION: Normal chest.  Original Report Authenticated By: Harrel Lemon, M.D.     Date: 04/29/2011  Rate: 59  Rhythm: sinus bradycardia  QRS Axis: normal  Intervals: normal  ST/T Wave abnormalities: early repolarization  Conduction Disutrbances:none  Narrative Interpretation:   Old EKG Reviewed: none available   MDM Pt has been having constant pain for 3 days since the insect bites which I think are unrelated:insite bites are not inflamed:pt is okay to go home and follow up with Dr. Artist Pais for continued symptoms      Teressa Lower, NP 04/29/11 1441

## 2011-04-30 NOTE — ED Provider Notes (Signed)
History/physical exam/procedure(s) were performed by non-physician practitioner and as supervising physician I was immediately available for consultation/collaboration. I have reviewed all notes and am in agreement with care and plan.   Hilario Quarry, MD 04/30/11 [phone]

## 2011-04-30 NOTE — ED Provider Notes (Addendum)
History     CSN: [id number] Arrival date & time: 04/29/2011 11:27 AM  Chief Complaint  Patient presents with  . Insect Bite    Chest discomfort after being bite by insects.     HPI  Past Medical History  Diagnosis Date  . Mixed hyperlipidemia   . Hypertension   . Bone spur     left foot  . OSA (obstructive sleep apnea)     s/p UPPP  . Murmur   . Acid reflux   . Stomach ulcer     Past Surgical History  Procedure Date  . Tonsillectomy     Family History  Problem Relation Age of Onset  . Dementia Mother   . Diabetes Mother   . Hypertension Mother   . Heart failure Mother   . Other Father     unknown  . Hypertension      siblings  . Other      no premature cad, colon ca, breast ca, or prostate ca    History  Substance Use Topics  . Smoking status: Never Smoker   . Smokeless tobacco: Not on file  . Alcohol Use: No      Review of Systems  Physical Exam  BP 126/80  Pulse 57  Temp(Src) 98.3 F (36.8 C) (Oral)  Resp 18  Ht 6' (1.829 m)  Wt 240 lb (108.863 kg)  BMI 32.55 kg/m2  SpO2 100%  Physical Exam  ED Course  Procedures  MDM     Hilario Quarry, MD 04/30/11 [id number]  Hilario Quarry, MD 04/30/11 1191  Hilario Quarry, MD 04/30/11 [phone]

## 2011-06-21 ENCOUNTER — Ambulatory Visit: Payer: 59 | Admitting: Internal Medicine

## 2011-09-15 ENCOUNTER — Ambulatory Visit (HOSPITAL_BASED_OUTPATIENT_CLINIC_OR_DEPARTMENT_OTHER)
Admission: RE | Admit: 2011-09-15 | Discharge: 2011-09-15 | Disposition: A | Discharge status: Home / Self Care | Payer: 59 | Source: Ambulatory Visit | Attending: Internal Medicine | Admitting: Internal Medicine

## 2011-09-15 ENCOUNTER — Ambulatory Visit (INDEPENDENT_AMBULATORY_CARE_PROVIDER_SITE_OTHER): Payer: 59 | Admitting: Internal Medicine

## 2011-09-15 ENCOUNTER — Encounter: Payer: Self-pay | Admitting: Internal Medicine

## 2011-09-15 DIAGNOSIS — M79609 Pain in unspecified limb: Secondary | ICD-10-CM | POA: Insufficient documentation

## 2011-09-15 DIAGNOSIS — R079 Chest pain, unspecified: Secondary | ICD-10-CM

## 2011-09-15 DIAGNOSIS — M25579 Pain in unspecified ankle and joints of unspecified foot: Secondary | ICD-10-CM

## 2011-09-15 DIAGNOSIS — M79673 Pain in unspecified foot: Secondary | ICD-10-CM

## 2011-09-15 DIAGNOSIS — Z79899 Other long term (current) drug therapy: Secondary | ICD-10-CM

## 2011-09-15 DIAGNOSIS — M79676 Pain in unspecified toe(s): Secondary | ICD-10-CM

## 2011-09-15 DIAGNOSIS — E785 Hyperlipidemia, unspecified: Secondary | ICD-10-CM

## 2011-09-15 DIAGNOSIS — Z23 Encounter for immunization: Secondary | ICD-10-CM

## 2011-09-15 DIAGNOSIS — M948X9 Other specified disorders of cartilage, unspecified sites: Secondary | ICD-10-CM

## 2011-09-15 DIAGNOSIS — R0789 Other chest pain: Secondary | ICD-10-CM

## 2011-09-15 LAB — HEPATIC FUNCTION PANEL
AST: 25 U/L (ref 0–37)
Alkaline Phosphatase: 66 U/L (ref 39–117)
Bilirubin, Direct: 0.1 mg/dL (ref 0.0–0.3)
Indirect Bilirubin: 0.4 mg/dL (ref 0.0–0.9)
Total Bilirubin: 0.5 mg/dL (ref 0.3–1.2)

## 2011-09-15 LAB — LIPID PANEL: LDL Cholesterol: 166 mg/dL — ABNORMAL HIGH (ref 0–99)

## 2011-09-15 LAB — BASIC METABOLIC PANEL
BUN: 12 mg/dL (ref 6–23)
CO2: 23 mEq/L (ref 19–32)
Calcium: 10 mg/dL (ref 8.4–10.5)
Chloride: 100 mEq/L (ref 96–112)
Creat: 1.17 mg/dL (ref 0.50–1.35)
Glucose, Bld: 87 mg/dL (ref 70–99)

## 2011-09-15 MED ORDER — DICLOFENAC SODIUM 75 MG PO TBEC: DELAYED_RELEASE_TABLET | ORAL | Status: AC

## 2011-09-16 DIAGNOSIS — M79673 Pain in unspecified foot: Secondary | ICD-10-CM | POA: Insufficient documentation

## 2011-09-16 NOTE — Assessment & Plan Note (Signed)
EKG obtained demonstrates sb 56 with nl intervals and axis. No evidence of acute ischemic change. Strong suspicion for GERD etiology. Take nexium qd. Followup if no improvement or worsening.

## 2011-09-16 NOTE — Assessment & Plan Note (Signed)
Obtain lipid/lft. 

## 2011-09-16 NOTE — Assessment & Plan Note (Signed)
Obtain plain radiograph of foot. Obtain uric acid and esr. Attempt course of voltaren with food and no other nsaids. Followup if no improvement or worsening.

## 2011-09-16 NOTE — Progress Notes (Signed)
  Subjective:    Patient ID: [name] Foster, male    DOB: 08/03/1966, 46 y.o.   MRN: [id number]  HPI Pt presents to clinic for evaluation of foot pain. Notes one month h/o left foot pain located plantar base of first toe. No  Injury or trauma. Pain has been worse with wt bearing but recently improved. Attempted ibuprofen for 2 days with improvement. No h/o gout, redness, warmth or swelling of the toe or foot. Also notes sternal chest discomfort that he relates to GERD sx's recently worse. Pain is nonexertional and non radiating. No associated dyspnea or diaphoresis. Taking nexium only 5-6 d/wk. No other complaints.  Past Medical History  Diagnosis Date  . Mixed hyperlipidemia   . Hypertension   . Bone spur     left foot  . OSA (obstructive sleep apnea)     s/p UPPP  . Murmur   . Acid reflux   . Stomach ulcer    Past Surgical History  Procedure Date  . Tonsillectomy     reports that he has never smoked. He has never used smokeless tobacco. He reports that he does not drink alcohol or use illicit drugs. family history includes Dementia in his mother; Diabetes in his mother; Heart failure in his mother; Hypertension in his mother and unspecified family member; and Other in his father and unspecified family member. No Known Allergies   Review of Systems see hpi     Objective:   Physical Exam  Nursing note and vitals reviewed. Constitutional: He appears well-developed and well-nourished. No distress.  HENT:  Head: Normocephalic and atraumatic.  Right Ear: External ear normal.  Left Ear: External ear normal.  Eyes: Conjunctivae are normal. No scleral icterus.  Cardiovascular: Normal rate, regular rhythm and normal heart sounds.   Pulmonary/Chest: Effort normal and breath sounds normal.  Musculoskeletal:       FROM left foot and anke. No erythema, warmth or effusion. NT plantar aspect first toe.able to wt bear and ambulate with difficulty  Neurological: He is alert.  Skin: Skin is  warm and dry. He is not diaphoretic.  Psychiatric: He has a normal mood and affect.          Assessment & Plan:

## 2011-09-22 ENCOUNTER — Other Ambulatory Visit: Payer: Self-pay | Admitting: Internal Medicine

## 2011-09-22 DIAGNOSIS — E785 Hyperlipidemia, unspecified: Secondary | ICD-10-CM

## 2011-09-22 NOTE — Telephone Encounter (Signed)
Rx refill sent to pharmacy. 

## 2011-10-25 ENCOUNTER — Encounter: Payer: Self-pay | Admitting: Family

## 2011-10-25 ENCOUNTER — Ambulatory Visit (INDEPENDENT_AMBULATORY_CARE_PROVIDER_SITE_OTHER): Payer: 59 | Admitting: Family

## 2011-10-25 ENCOUNTER — Ambulatory Visit: Payer: 59 | Admitting: Family

## 2011-10-25 DIAGNOSIS — M79673 Pain in unspecified foot: Secondary | ICD-10-CM

## 2011-10-25 DIAGNOSIS — M79609 Pain in unspecified limb: Secondary | ICD-10-CM

## 2011-10-25 MED ORDER — COLCHICINE 0.6 MG PO TABS: ORAL_TABLET | ORAL | Status: DC

## 2011-10-25 MED ORDER — DICLOFENAC SODIUM 75 MG PO TBEC: 75.0000 mg | DELAYED_RELEASE_TABLET | Freq: Two times a day (BID) | ORAL | Status: DC

## 2011-10-25 NOTE — Progress Notes (Signed)
Subjective:    Patient ID: [name] Foster, male    DOB: 1966-05-09, 46 y.o.   MRN: [id number]  HPI  [name] Foster is a 46 yr old male who presents today with chief complaint of foot pain.  Pain is located at the base of the left great toe on the plantar surface.  Notes that he saw Dr. Rodena Medin for same symptoms on 1/4 and was treated with diclofenac.  He noted improvement in his pain with diclofenac.  After diclofenac ran out he noted recurrence of pain.  Notes that his pain is not as severe today as it was on 1/4.  He did have a uric acid level drawn at that time which was normal and an x-ray which noted bony erosion of the plantar aspect at the base of the distal phalanx. This was felt to be due may be due to inflammatory osteoarthritis such as gout or degenerative osteoarthritis.   Review of Systems See HPI  Past Medical History  Diagnosis Date  . Mixed hyperlipidemia   . Hypertension   . Bone spur     left foot  . OSA (obstructive sleep apnea)     s/p UPPP  . Murmur   . Acid reflux   . Stomach ulcer     History   Social History  . Marital Status: Married    Spouse Name: N/A    Number of Children: N/A  . Years of Education: N/A   Occupational History  . Not on file.   Social History Main Topics  . Smoking status: Never Smoker   . Smokeless tobacco: Never Used  . Alcohol Use: No  . Drug Use: No  . Sexually Active: Yes   Other Topics Concern  . Not on file   Social History Narrative   Tobacco Use - No. 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 Tonsillectomy     Family History  Problem Relation Age of Onset  . Dementia Mother   . Diabetes Mother   . Hypertension Mother   . Heart failure Mother   .  Other Father     unknown  . Hypertension      siblings  . Other      no premature cad, colon ca, breast ca, or prostate ca    No Known Allergies  Current Outpatient Prescriptions on File Prior to Visit  Medication Sig Dispense Refill  . amLODipine (NORVASC) 10 MG tablet TAKE 1 TABLET BY MOUTH ONCE DAILY  90 tablet  1  . esomeprazole (NEXIUM) 40 MG capsule Take 1 capsule (40 mg total) by mouth daily.  30 capsule  0  . lisinopril-hydrochlorothiazide (ZESTORETIC) 20-12.5 MG per tablet Take 1 tablet by mouth daily.  90 tablet  1  . simvastatin (ZOCOR) 20 MG tablet Take 1 tablet (20 mg total) by mouth at bedtime.  90 tablet  1  . tadalafil (CIALIS) 20 MG tablet Take 20 mg by mouth daily as needed.          BP 148/90  Pulse 74  Temp(Src) 98.1 F (36.7 C) (Oral)  Resp 16  Wt 242 lb 1.3 oz (109.807 kg)  SpO2 97%       Objective:  Physical Exam  Constitutional: He appears well-developed and well-nourished.  Pulmonary/Chest: Effort normal and breath sounds normal. No respiratory distress. He has no wheezes. He has no rales. He exhibits no tenderness.  Musculoskeletal:       + tenderness to palpation of plantar aspect of left great toe. No erythema or swelling.           Assessment & Plan:

## 2011-10-25 NOTE — Patient Instructions (Signed)
Please call if your symptoms worsen, or if you are not feeling better in 2-3 days.  

## 2011-10-25 NOTE — Assessment & Plan Note (Signed)
Will plan to send refill on voltaren.  Treat empirically with colcrys.  If symptoms worsen or don't improve, plan referral to orthopedics.

## 2011-12-20 ENCOUNTER — Encounter: Payer: Self-pay | Admitting: Family

## 2011-12-20 ENCOUNTER — Ambulatory Visit (INDEPENDENT_AMBULATORY_CARE_PROVIDER_SITE_OTHER): Payer: 59 | Admitting: Family

## 2011-12-20 VITALS — BP 134/94 | HR 67 | Temp 97.9°F | Resp 16 | Ht 72.01 in | Wt 238.1 lb

## 2011-12-20 DIAGNOSIS — M549 Dorsalgia, unspecified: Secondary | ICD-10-CM

## 2011-12-20 DIAGNOSIS — T148XXA Other injury of unspecified body region, initial encounter: Secondary | ICD-10-CM

## 2011-12-20 DIAGNOSIS — M79673 Pain in unspecified foot: Secondary | ICD-10-CM

## 2011-12-20 DIAGNOSIS — M79609 Pain in unspecified limb: Secondary | ICD-10-CM

## 2011-12-20 LAB — POCT URINALYSIS DIPSTICK
Glucose, UA: NEGATIVE
Ketones, UA: NEGATIVE
Leukocytes, UA: NEGATIVE
Spec Grav, UA: 1.02

## 2011-12-20 MED ORDER — MELOXICAM 7.5 MG PO TABS: 7.5000 mg | ORAL_TABLET | Freq: Every day | ORAL | Status: DC

## 2011-12-20 NOTE — Progress Notes (Signed)
Subjective:    Patient ID: [name] Foster, male    DOB: Sep 17, 1965, 46 y.o.   MRN: [id number]  HPI  Mr.  Foster is a 46 yr old male who presents today with chief complaint of back pain.  Pain originates from the left lower back and into the left lower abdomen.  Symptoms started on Sunday.  Reports that symptoms worse with movement.  Denies associated dysuria, frequency or hematuria.  Denies known injury.  L foot pain- notes that he continues to have pain in the left foot intermittently.  Tried colchicine without improvement.     Review of Systems See HPI  Past Medical History  Diagnosis Date  . Mixed hyperlipidemia   . Hypertension   . Bone spur     left foot  . OSA (obstructive sleep apnea)     s/p UPPP  . Murmur   . Acid reflux   . Stomach ulcer     History   Social History  . Marital Status: Married    Spouse Name: N/A    Number of Children: N/A  . Years of Education: N/A   Occupational History  . Not on file.   Social History Main Topics  . Smoking status: Never Smoker   . Smokeless tobacco: Never Used  . Alcohol Use: No  . Drug Use: No  . Sexually Active: Yes   Other Topics Concern  . Not on file   Social History Narrative   Tobacco Use - No. Full Time- Midwife [employer] areaMarried - 13 yearsAlcohol Use - noRegular Exercise - yesDrug Use - no3 girls 3 boysSmoking Status:  quitPacks/Day:  <0.25Caffeine use/day:  1 cup coffee every other dayDoes Patient Exercise:  no    Past Surgical History  Procedure Date  . Tonsillectomy     Family History  Problem Relation Age of Onset  . Dementia Mother   . Diabetes Mother   . Hypertension Mother   . Heart failure Mother   . Other Father     unknown  . Hypertension      siblings  . Other      no premature cad, colon ca, breast ca, or prostate ca    No Known Allergies  Current Outpatient Prescriptions on File Prior to Visit  Medication Sig Dispense Refill  . amLODipine  (NORVASC) 10 MG tablet TAKE 1 TABLET BY MOUTH ONCE DAILY  90 tablet  1  . colchicine 0.6 MG tablet 2 tabs now followed by 1 tablet one hour later.  6 tablet  0  . diclofenac (VOLTAREN) 75 MG EC tablet Take 1 tablet (75 mg total) by mouth 2 (two) times daily.  20 tablet  0  . esomeprazole (NEXIUM) 40 MG capsule Take 40 mg by mouth daily.      [name] lisinopril-hydrochlorothiazide (ZESTORETIC) 20-12.5 MG per tablet Take 1 tablet by mouth daily.  90 tablet  1  . simvastatin (ZOCOR) 20 MG tablet Take 1 tablet (20 mg total) by mouth at bedtime.  90 tablet  1  . tadalafil (CIALIS) 20 MG tablet Take 20 mg by mouth daily as needed.          BP 134/94  Pulse 67  Temp(Src) 97.9 F (36.6 C) (Oral)  Resp 16  Ht 6' 0.01" (1.829 m)  Wt 238 lb 1.9 oz (108.011 kg)  BMI 32.29 kg/m2  SpO2 97%        Objective:   Physical Exam  Constitutional: He appears well-developed and well-nourished. No distress.  Cardiovascular: Normal rate and regular rhythm.   No murmur heard. Pulmonary/Chest: Effort normal and breath sounds normal. No respiratory distress. He has no wheezes. He has no rales. He exhibits no tenderness.  Abdominal: Soft. Bowel sounds are normal. He exhibits no distension. There is no tenderness. There is no rebound and no guarding.  Genitourinary:       Neg cvat bilaterally  Musculoskeletal: He exhibits no edema.  Psychiatric: He has a normal mood and affect. His behavior is normal. Judgment and thought content normal.          Assessment & Plan:

## 2011-12-20 NOTE — Patient Instructions (Signed)
Please call if your symptoms worsen, if you develop nausea, vomitting, fever, constipation/diarrhea, urinary problems, or if you pain does not improve.

## 2011-12-21 DIAGNOSIS — R109 Unspecified abdominal pain: Secondary | ICD-10-CM | POA: Insufficient documentation

## 2011-12-21 NOTE — Assessment & Plan Note (Signed)
Pt had a uric acid level drawn during an episode of foot pain and the level was <5.  X-ray of left foot suggested that joint changes could be due to degenerative osteoarthritis vs gout.  No improvement with colcrys.  Intermittent.  I recommended tylenol prn.  I suspect that his symptoms may be due to osteoarthritis.

## 2011-12-21 NOTE — Assessment & Plan Note (Signed)
I suspect that his back pain is musculoskeletal in nature.  UA is reviewed today is negative for blood- therefore doubt kidney stone. No abdominal tenderness is noted. I have asked him to keep an eye on the skin for any rashes/lesions as early zoster is a possibility as well.  Trial of mobic.  Pt is instructed to contact us if symptoms worsen, or if no improvement. Pt verbalizes understanding.

## 2012-01-05 ENCOUNTER — Other Ambulatory Visit: Payer: Self-pay | Admitting: Internal Medicine

## 2012-01-05 NOTE — Telephone Encounter (Signed)
Rx refill sent to pharmacy. 

## 2012-01-08 ENCOUNTER — Telehealth: Payer: Self-pay | Admitting: Internal Medicine

## 2012-01-08 MED ORDER — LISINOPRIL-HYDROCHLOROTHIAZIDE 20-12.5 MG PO TABS: 1.0000 | ORAL_TABLET | Freq: Every day | ORAL | Status: DC

## 2012-01-08 NOTE — Telephone Encounter (Signed)
Call returned to [facility] [phone] . She was informed of medication refill to pharmacy and appointment clarification of scheduled appointments. She was informed patient is scheduled for follow up on 5/1 and 6/6, and that patient did not need to keep both appointments. The appointment for June was kept to keep on track with scheduled blood work. No further action is required at this time.

## 2012-01-08 NOTE — Telephone Encounter (Signed)
Patients wife Merita Norton wants to know why patient has to be seen before further refills of Lisinopril can be filled. I did tell her that we refilled lisinopril last Friday. She states that patient was just seen a few months ago.

## 2012-01-10 ENCOUNTER — Ambulatory Visit: Payer: 59 | Admitting: Internal Medicine

## 2012-02-06 ENCOUNTER — Encounter: Payer: Self-pay | Admitting: Family

## 2012-02-06 ENCOUNTER — Ambulatory Visit (HOSPITAL_BASED_OUTPATIENT_CLINIC_OR_DEPARTMENT_OTHER)
Admission: RE | Admit: 2012-02-06 | Discharge: 2012-02-06 | Disposition: A | Discharge status: Home / Self Care | Payer: 59 | Source: Ambulatory Visit | Attending: Family | Admitting: Family

## 2012-02-06 ENCOUNTER — Ambulatory Visit (INDEPENDENT_AMBULATORY_CARE_PROVIDER_SITE_OTHER): Payer: 59 | Admitting: Family

## 2012-02-06 ENCOUNTER — Telehealth: Payer: Self-pay | Admitting: Family

## 2012-02-06 VITALS — BP 148/92 | HR 64 | Temp 98.0°F | Resp 16 | Ht 72.0 in | Wt 241.1 lb

## 2012-02-06 DIAGNOSIS — R109 Unspecified abdominal pain: Secondary | ICD-10-CM

## 2012-02-06 DIAGNOSIS — K654 Sclerosing mesenteritis: Secondary | ICD-10-CM | POA: Insufficient documentation

## 2012-02-06 DIAGNOSIS — K5730 Diverticulosis of large intestine without perforation or abscess without bleeding: Secondary | ICD-10-CM | POA: Insufficient documentation

## 2012-02-06 DIAGNOSIS — R3129 Other microscopic hematuria: Secondary | ICD-10-CM | POA: Insufficient documentation

## 2012-02-06 LAB — POCT URINALYSIS DIPSTICK
Bilirubin, UA: NEGATIVE
Ketones, UA: NEGATIVE
Leukocytes, UA: NEGATIVE
Nitrite, UA: NEGATIVE
Protein, UA: NEGATIVE
pH, UA: 6.5

## 2012-02-06 MED ORDER — CEPHALEXIN 500 MG PO CAPS: 500.0000 mg | ORAL_CAPSULE | Freq: Four times a day (QID) | ORAL | Status: AC

## 2012-02-06 NOTE — Progress Notes (Signed)
Addended by: Mervin Kung A on: 02/06/2012 04:41 PM   Modules accepted: Orders

## 2012-02-06 NOTE — Telephone Encounter (Signed)
Message left for pt to return our call.  When he calls back, please let him know that his CT is negative for kidney stone.  I suspect that his flank pain is musculoskeletal.  There was an incidental finding of some mild  inflammation of the abdominal fat which I would like for him to start keflex.  If symptoms worsen, or if not improved in 1 week, pt will need to be seen back in the office.

## 2012-02-06 NOTE — Patient Instructions (Signed)
Please complete your CT scan on the first floor.    

## 2012-02-06 NOTE — Assessment & Plan Note (Signed)
Deteriorated.  Pt has trace blood on urine dip today.  Will refer for CT abd/pelvis to exclude kidney stone.

## 2012-02-06 NOTE — Progress Notes (Signed)
Subjective:    Patient ID: [name] Foster, male    DOB: 1966-05-07, 46 y.o.   MRN: [id number]  HPI  [name] Foster is a 46 yr old male who presents today with chief complaint of left sided back pain.  He was first evaluated for this complaint back in early April and was treated with meloxicam for probable musculoskeletal pain.  He reports that he completed the meloxicam without improvement.  He reports that the left flank pain also radiates anteriorly. He denies associated fever dysuria or gross hematuria.  He does reports some associated urinary frequency. Pain is worsened when laying down and with sitting.  Pain is constant.     Review of Systems See HPI  Past Medical History  Diagnosis Date  . Mixed hyperlipidemia   . Hypertension   . Bone spur     left foot  . OSA (obstructive sleep apnea)     s/p UPPP  . Murmur   . Acid reflux   . Stomach ulcer     History   Social History  . Marital Status: Married    Spouse Name: N/A    Number of Children: N/A  . Years of Education: N/A   Occupational History  . Not on file.   Social History Main Topics  . Smoking status: Never Smoker   . Smokeless tobacco: Never Used  . Alcohol Use: No  . Drug Use: No  . Sexually Active: Yes   Other Topics Concern  . Not on file   Social History Narrative   Tobacco Use - No. 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 Tonsillectomy     Family History  Problem Relation Age of Onset  . Dementia Mother   . Diabetes Mother   . Hypertension Mother   . Heart failure Mother   . Other Father     unknown  . Hypertension      siblings  . Other      no premature cad, colon ca, breast ca, or prostate ca    No Known  Allergies  Current Outpatient Prescriptions on File Prior to Visit  Medication Sig Dispense Refill  . amLODipine (NORVASC) 10 MG tablet TAKE 1 TABLET BY MOUTH ONCE DAILY  90 tablet  1  . esomeprazole (NEXIUM) 40 MG capsule Take 40 mg by mouth daily.      [name] lisinopril-hydrochlorothiazide (PRINZIDE,ZESTORETIC) 20-12.5 MG per tablet Take 1 tablet by mouth daily.  90 tablet  1  . simvastatin (ZOCOR) 20 MG tablet Take 1 tablet (20 mg total) by mouth at bedtime.  90 tablet  1  . tadalafil (CIALIS) 20 MG tablet Take 20 mg by mouth daily as needed.        . colchicine 0.6 MG tablet 2 tabs now followed by 1 tablet one hour later.  6 tablet  0  . diclofenac (VOLTAREN) 75 MG EC tablet Take 1 tablet (75 mg total) by mouth 2 (two) times daily.  20 tablet  0  . meloxicam (MOBIC) 7.5 MG tablet Take 1 tablet (7.5 mg total) by mouth daily.  10 tablet  0    BP 148/92  Pulse 64  Temp(Src) 98 F (36.7 C) (Oral)  Resp 16  Ht 6' (1.829 m)  Wt 241 lb 1.3 oz (109.353 kg)  BMI 32.70 kg/m2  SpO2 98%       Objective:   Physical Exam  Constitutional: He appears well-developed and well-nourished. No distress.  Cardiovascular: Normal rate and regular rhythm.   No murmur heard. Pulmonary/Chest: Effort normal and breath sounds normal. No respiratory distress. He has no wheezes. He has no rales. He exhibits no tenderness.  Abdominal: Soft. Bowel sounds are normal. He exhibits no distension and no mass. There is no tenderness. There is no rebound and no guarding.  Genitourinary:       Neg CVAT bilaterally  Musculoskeletal: He exhibits no edema.  Skin: Skin is warm and dry.  Psychiatric: He has a normal mood and affect. His behavior is normal. Judgment and thought content normal.          Assessment & Plan:

## 2012-02-06 NOTE — Telephone Encounter (Signed)
Notified pt. 

## 2012-02-06 NOTE — Assessment & Plan Note (Signed)
CT results reviewed with Dr. Milinda Cave.  Will plan empiric keflex for incidental finding of mild mesenteric panniculitis.  I doubt, however that this finding is related to pt's complaint of flank pain which is likely musculoskeletal.

## 2012-02-07 ENCOUNTER — Telehealth: Payer: Self-pay | Admitting: Internal Medicine

## 2012-02-07 LAB — URINALYSIS, ROUTINE W REFLEX MICROSCOPIC
Hgb urine dipstick: NEGATIVE
Ketones, ur: NEGATIVE mg/dL
Leukocytes, UA: NEGATIVE
Nitrite: NEGATIVE
Urobilinogen, UA: 0.2 mg/dL (ref 0.0–1.0)
pH: 6.5 (ref 5.0–8.0)

## 2012-02-07 MED ORDER — IBUPROFEN 800 MG PO TABS: 800.0000 mg | ORAL_TABLET | Freq: Three times a day (TID) | ORAL | Status: DC | PRN

## 2012-02-07 NOTE — Telephone Encounter (Signed)
Spoke with pt, he wanted to know why he was taking an antibiotic. Reminded pt that CT showed some inflammation of the abdominal fat and he should take the abx to prevent possible infection. Pt also requested Ibuprofen 800mg  for pain he is still having. Per provider, ok to give #20, 1 tablet every eight hours as needed for pain x no refills. Pt advised & rx sent to pharmacy.

## 2012-02-07 NOTE — Telephone Encounter (Signed)
Patients wife Merita Norton called stating that patient is still in pain. He would like to know if Efraim Kaufmann would call him in something for pain. [facility] on [address]

## 2012-02-15 ENCOUNTER — Encounter: Payer: 59 | Admitting: Internal Medicine

## 2012-02-15 ENCOUNTER — Ambulatory Visit: Payer: 59 | Admitting: Internal Medicine

## 2012-02-15 DIAGNOSIS — Z0289 Encounter for other administrative examinations: Secondary | ICD-10-CM

## 2012-02-21 ENCOUNTER — Telehealth: Payer: Self-pay | Admitting: Internal Medicine

## 2012-02-21 MED ORDER — ESOMEPRAZOLE MAGNESIUM 40 MG PO CPDR: 40.0000 mg | DELAYED_RELEASE_CAPSULE | Freq: Every day | ORAL | Status: DC

## 2012-02-21 NOTE — Telephone Encounter (Signed)
Refill- nexium 40mg  capsule. Take one capsule by mouth daily. Qty 90 last fill 3.12.13

## 2012-02-21 NOTE — Telephone Encounter (Signed)
Refill sent to pharmacy.   

## 2012-02-28 ENCOUNTER — Encounter: Payer: Self-pay | Admitting: Internal Medicine

## 2012-02-28 ENCOUNTER — Telehealth: Payer: Self-pay | Admitting: Internal Medicine

## 2012-02-28 ENCOUNTER — Ambulatory Visit (HOSPITAL_BASED_OUTPATIENT_CLINIC_OR_DEPARTMENT_OTHER)
Admission: RE | Admit: 2012-02-28 | Discharge: 2012-02-28 | Disposition: A | Discharge status: Home / Self Care | Payer: 59 | Source: Ambulatory Visit | Attending: Internal Medicine | Admitting: Internal Medicine

## 2012-02-28 ENCOUNTER — Ambulatory Visit (INDEPENDENT_AMBULATORY_CARE_PROVIDER_SITE_OTHER): Payer: 59 | Admitting: Internal Medicine

## 2012-02-28 VITALS — BP 124/90 | HR 76 | Temp 98.2°F | Resp 18 | Ht 72.0 in | Wt 242.0 lb

## 2012-02-28 DIAGNOSIS — Z79899 Other long term (current) drug therapy: Secondary | ICD-10-CM

## 2012-02-28 DIAGNOSIS — M549 Dorsalgia, unspecified: Secondary | ICD-10-CM | POA: Insufficient documentation

## 2012-02-28 DIAGNOSIS — E785 Hyperlipidemia, unspecified: Secondary | ICD-10-CM

## 2012-02-28 DIAGNOSIS — Z0000 Encounter for general adult medical examination without abnormal findings: Secondary | ICD-10-CM

## 2012-02-28 LAB — CBC WITH DIFFERENTIAL/PLATELET
Basophils Absolute: 0 10*3/uL (ref 0.0–0.1)
Basophils Relative: 0 % (ref 0–1)
Eosinophils Absolute: 0 10*3/uL (ref 0.0–0.7)
MCH: 28 pg (ref 26.0–34.0)
MCHC: 34.1 g/dL (ref 30.0–36.0)
Neutro Abs: 2.3 10*3/uL (ref 1.7–7.7)
Neutrophils Relative %: 56 % (ref 43–77)
Platelets: 239 10*3/uL (ref 150–400)
RDW: 14.4 % (ref 11.5–15.5)

## 2012-02-28 LAB — HEPATIC FUNCTION PANEL
ALT: 38 U/L (ref 0–53)
AST: 27 U/L (ref 0–37)
Albumin: 4.9 g/dL (ref 3.5–5.2)
Bilirubin, Direct: 0.1 mg/dL (ref 0.0–0.3)
Total Protein: 7.9 g/dL (ref 6.0–8.3)

## 2012-02-28 LAB — LIPID PANEL
Cholesterol: 242 mg/dL — ABNORMAL HIGH (ref 0–200)
HDL: 45 mg/dL (ref >39)
Total CHOL/HDL Ratio: 5.4 Ratio
Triglycerides: 102 mg/dL (ref <150)

## 2012-02-28 LAB — BASIC METABOLIC PANEL
Calcium: 10.1 mg/dL (ref 8.4–10.5)
Glucose, Bld: 92 mg/dL (ref 70–99)
Potassium: 3.8 mEq/L (ref 3.5–5.3)
Sodium: 138 mEq/L (ref 135–145)

## 2012-02-28 MED ORDER — PREDNISONE 10 MG PO TABS: ORAL_TABLET | ORAL | Status: AC

## 2012-02-28 MED ORDER — ESOMEPRAZOLE MAGNESIUM 40 MG PO CPDR: 40.0000 mg | DELAYED_RELEASE_CAPSULE | Freq: Every day | ORAL | Status: DC

## 2012-02-28 MED ORDER — SIMVASTATIN 20 MG PO TABS: 20.0000 mg | ORAL_TABLET | Freq: Every day | ORAL | Status: DC

## 2012-02-28 NOTE — Patient Instructions (Signed)
Please schedule fasting labs prior to next visit [id number] and lipid/lft-272.4 

## 2012-02-28 NOTE — Telephone Encounter (Signed)
Lab order entered for November 2013.

## 2012-02-29 LAB — URINALYSIS, ROUTINE W REFLEX MICROSCOPIC
Bilirubin Urine: NEGATIVE
Glucose, UA: NEGATIVE mg/dL
Leukocytes, UA: NEGATIVE
Specific Gravity, Urine: 1.02 (ref 1.005–1.030)
Urobilinogen, UA: 0.2 mg/dL (ref 0.0–1.0)

## 2012-02-29 LAB — PSA: PSA: 2.42 ng/mL (ref <=4.00)

## 2012-03-09 DIAGNOSIS — Z0000 Encounter for general adult medical examination without abnormal findings: Secondary | ICD-10-CM | POA: Insufficient documentation

## 2012-03-09 DIAGNOSIS — M549 Dorsalgia, unspecified: Secondary | ICD-10-CM | POA: Insufficient documentation

## 2012-03-09 NOTE — Assessment & Plan Note (Signed)
Obtain cpe labs

## 2012-03-09 NOTE — Progress Notes (Signed)
  Subjective:    Patient ID: [name] Foster, male    DOB: Dec 08, 1965, 46 y.o.   MRN: [id number]  HPI Pt presents to clinic for annual physical. Notes left low back pressure with left upper leg radiation. mobic helps. Denies leg weakness, numbness or incontinence. No injury. Has chronic intermittent left first toe pain without inflammatory changes or injury. Colchicine no help.   Past Medical History  Diagnosis Date  . Mixed hyperlipidemia   . Hypertension   . Bone spur     left foot  . OSA (obstructive sleep apnea)     s/p UPPP  . Murmur   . Acid reflux   . Stomach ulcer    Past Surgical History  Procedure Date  . Tonsillectomy     reports that he has never smoked. He has never used smokeless tobacco. He reports that he does not drink alcohol or use illicit drugs. family history includes Dementia in his mother; Diabetes in his mother; Heart failure in his mother; Hypertension in his mother and unspecified family member; and Other in his father and unspecified family member. No Known Allergies   Review of Systems see hpi     Objective:   Physical Exam  Physical Exam  Nursing note and vitals reviewed. Constitutional: He appears well-developed and well-nourished. No distress.  HENT:  Head: Normocephalic and atraumatic.  Right Ear: Tympanic membrane and external ear normal.  Left Ear: Tympanic membrane and external ear normal.  Nose: Nose normal.  Mouth/Throat: Uvula is midline, oropharynx is clear and moist and mucous membranes are normal. No oropharyngeal exudate.  Eyes: Conjunctivae and EOM are normal. Pupils are equal, round, and reactive to light. Right eye exhibits no discharge. Left eye exhibits no discharge. No scleral icterus.  Neck: Neck supple. Carotid bruit is not present. No thyromegaly present.  Cardiovascular: Normal rate, regular rhythm and normal heart sounds.  Exam reveals no gallop and no friction rub.   No murmur heard. Pulmonary/Chest: Effort normal and  breath sounds normal. No respiratory distress. He has no wheezes. He has no rales.  Abdominal: Soft. He exhibits no distension and no mass. There is no hepatosplenomegaly. There is no tenderness. There is no rebound. Hernia confirmed negative in the right inguinal area and confirmed negative in the left inguinal area.  Lymphadenopathy:    He has no cervical adenopathy.  MSK: no midline ls tenderness or bony abn. Slight left paraspinal muscle tenderness. Bilateral LE strength 5/5. Gait nl. SLR + left Neurological: He is alert.  Skin: Skin is warm and dry. He is not diaphoretic.  Psychiatric: He has a normal mood and affect.        Assessment & Plan:

## 2012-03-09 NOTE — Assessment & Plan Note (Signed)
Obtain ls radiograph. Attempt prednisone. Schedule PT. Followup if no improvement or worsening.

## 2012-03-12 ENCOUNTER — Telehealth: Payer: Self-pay | Admitting: *Deleted

## 2012-03-12 DIAGNOSIS — E785 Hyperlipidemia, unspecified: Secondary | ICD-10-CM

## 2012-03-12 NOTE — Telephone Encounter (Signed)
Notes Recorded by Edwyna Perfect, MD on 03/10/2012 at 7:21 PM Chol too high. If taking zocor 20mg  every day then increase to 40mg  qd and recheck fasting lipid/lft in 6wks 272.4  Patient informed of MD instructions [has enough Rx to increase until labs repeat], labs ordered; patient understood & agreed/SLS

## 2012-06-20 ENCOUNTER — Other Ambulatory Visit: Payer: Self-pay | Admitting: Family

## 2012-06-21 NOTE — Telephone Encounter (Signed)
Left detailed message on home # to call and let us know why he is needing Rx of Ibuprofen.

## 2012-06-25 ENCOUNTER — Emergency Department (HOSPITAL_COMMUNITY)
Admission: EM | Admit: 2012-06-25 | Discharge: 2012-06-25 | Disposition: A | Discharge status: Home / Self Care | Payer: 59 | Source: Home / Self Care | Attending: Emergency Medicine | Admitting: Emergency Medicine

## 2012-06-25 ENCOUNTER — Encounter (HOSPITAL_COMMUNITY): Payer: Self-pay | Admitting: *Deleted

## 2012-06-25 DIAGNOSIS — K219 Gastro-esophageal reflux disease without esophagitis: Secondary | ICD-10-CM

## 2012-06-25 MED ORDER — SUCRALFATE 1 G PO TABS: 1.0000 g | ORAL_TABLET | Freq: Four times a day (QID) | ORAL | Status: DC

## 2012-06-25 MED ORDER — OMEPRAZOLE 40 MG PO CPDR: 40.0000 mg | DELAYED_RELEASE_CAPSULE | Freq: Every day | ORAL | Status: DC

## 2012-06-25 NOTE — ED Provider Notes (Signed)
History     CSN: [id number]  Arrival date & time 06/25/12  1545   First MD Initiated Contact with Patient 06/25/12 1703      Chief Complaint  Patient presents with  . Chest Pain    (Consider location/radiation/quality/duration/timing/severity/associated sxs/prior treatment) HPI 2 weeks of burning substernal chest discomfort.  Pt reports no inciting event.  Pain has been relatively constant and not particularly worsened or relieved by anything.  It has some radiation to the back.  Patient has history of reflux and reports that this feels similar.  He has no nausea/vomiting, diaphoresis, shortness of breath, pain in arms or jaw, diarrhea, or urinary symptoms.  He is a non-smoker and does not drink alcohol.  He has minimal NSAID use (1x per week).   Past Medical History  Diagnosis Date  . Mixed hyperlipidemia   . Hypertension   . Bone spur     left foot  . OSA (obstructive sleep apnea)     s/p UPPP  . Murmur   . Acid reflux   . Stomach ulcer     Past Surgical History  Procedure Date  . Tonsillectomy     Family History  Problem Relation Age of Onset  . Dementia Mother   . Diabetes Mother   . Hypertension Mother   . Heart failure Mother   . Other Father     unknown  . Hypertension      siblings  . Other      no premature cad, colon ca, breast ca, or prostate ca    History  Substance Use Topics  . Smoking status: Never Smoker   . Smokeless tobacco: Never Used  . Alcohol Use: No      Review of Systems  Cardiovascular: Positive for chest pain.  All other systems reviewed and are negative.   otherwise per HPI.  Allergies  Review of patient's allergies indicates no known allergies.  Home Medications   Current Outpatient Rx  Name Route Sig Dispense Refill  . AMLODIPINE BESYLATE 10 MG PO TABS  TAKE 1 TABLET BY MOUTH ONCE DAILY 90 tablet 1  . ESOMEPRAZOLE MAGNESIUM 40 MG PO CPDR Oral Take 1 capsule (40 mg total) by mouth daily. 90 capsule 1  . IBUPROFEN  800 MG PO TABS Oral Take 1 tablet (800 mg total) by mouth every 8 (eight) hours as needed. 20 tablet 0  . LISINOPRIL-HYDROCHLOROTHIAZIDE 20-12.5 MG PO TABS Oral Take 1 tablet by mouth daily. 90 tablet 1  . OMEPRAZOLE 40 MG PO CPDR Oral Take 1 capsule (40 mg total) by mouth daily. 30 capsule 0  . SIMVASTATIN 20 MG PO TABS Oral Take 1 tablet (20 mg total) by mouth at bedtime. 90 tablet 1  . SUCRALFATE 1 G PO TABS Oral Take 1 tablet (1 g total) by mouth 4 (four) times daily. 80 tablet 0  . TADALAFIL 20 MG PO TABS Oral Take 20 mg by mouth daily as needed.        BP 148/92  Pulse 69  Temp 98.5 F (36.9 C) (Oral)  Resp 18  SpO2 98%  Physical Exam  Constitutional: He is oriented to person, place, and time. He appears well-developed and well-nourished. No distress.  HENT:  Head: Normocephalic and atraumatic.  Right Ear: External ear normal.  Left Ear: External ear normal.  Cardiovascular: Normal rate, regular rhythm and normal heart sounds.   Pulmonary/Chest: Effort normal and breath sounds normal.       Mild discomfort with firm  palpation over sternum  Abdominal: Soft. He exhibits no distension and no mass. There is no tenderness. There is no rebound and no guarding.  Neurological: He is alert and oriented to person, place, and time.    ED Course  Procedures (including critical care time)  Labs Reviewed - No data to display No results found.   1. Acid reflux      Date: 06/25/2012  Rate: 61  Rhythm: normal sinus rhythm  QRS Axis: normal  Intervals: normal  ST/T Wave abnormalities: normal  Conduction Disutrbances:none  Narrative Interpretation: Normal EKG  Old EKG Reviewed: unchanged    MDM  Non-cardiac chest pain, likely reflux related.  Will treat with PPI and Carafate and rec close follow up with PCP.  Patient would likely benefit from exercise stress test.        Brent Bulla, MD 06/25/12 [phone]

## 2012-06-25 NOTE — ED Notes (Signed)
Reviewed instructions and medicines prescribed for patient.  Patient instructed to take carafate and routine medicine, nexium.  Called in medicine to [facility] at [phone].  carafate one gram, take one tablet by mouth 4 times daily.  #30 capsules, no refill

## 2012-06-25 NOTE — ED Notes (Signed)
Pt reports chest pain and burning for the past month. Denies radiating pain, n/v/d

## 2012-06-25 NOTE — ED Provider Notes (Signed)
Medical screening examination/treatment/procedure(s) were performed by a resident physician and as supervising physician I was immediately available for consultation/collaboration.  Additionally, I saw the patient independently, verified the history, examined the patient and discussed the treatment plan with the resident. The pain appears to be due to gastroesophageal reflux and is not typical for cardiac disease. I agree with are explanted to treat for reflux and to have him followup with a cardiologist for stress testing. We did given some red flag symptoms and if he should become worse in any way we suggested that he proceed to the emergency room via ambulance.  [name], M.D.    [name], MD 06/25/12 2158

## 2012-06-26 ENCOUNTER — Encounter (HOSPITAL_COMMUNITY): Payer: Self-pay

## 2012-06-26 ENCOUNTER — Emergency Department (HOSPITAL_BASED_OUTPATIENT_CLINIC_OR_DEPARTMENT_OTHER): Payer: 59

## 2012-06-26 ENCOUNTER — Encounter (HOSPITAL_BASED_OUTPATIENT_CLINIC_OR_DEPARTMENT_OTHER): Payer: Self-pay | Admitting: *Deleted

## 2012-06-26 ENCOUNTER — Emergency Department (HOSPITAL_BASED_OUTPATIENT_CLINIC_OR_DEPARTMENT_OTHER)
Admission: EM | Admit: 2012-06-26 | Discharge: 2012-06-26 | Disposition: A | Discharge status: Home / Self Care | Payer: 59 | Attending: Emergency Medicine | Admitting: Emergency Medicine

## 2012-06-26 ENCOUNTER — Emergency Department (HOSPITAL_COMMUNITY)
Admission: EM | Admit: 2012-06-26 | Discharge: 2012-06-26 | Disposition: A | Discharge status: Home / Self Care | Payer: 59 | Attending: Emergency Medicine | Admitting: Emergency Medicine

## 2012-06-26 DIAGNOSIS — R51 Headache: Secondary | ICD-10-CM | POA: Insufficient documentation

## 2012-06-26 DIAGNOSIS — R6884 Jaw pain: Secondary | ICD-10-CM | POA: Insufficient documentation

## 2012-06-26 DIAGNOSIS — I10 Essential (primary) hypertension: Secondary | ICD-10-CM | POA: Insufficient documentation

## 2012-06-26 DIAGNOSIS — M25519 Pain in unspecified shoulder: Secondary | ICD-10-CM | POA: Insufficient documentation

## 2012-06-26 DIAGNOSIS — Z79899 Other long term (current) drug therapy: Secondary | ICD-10-CM | POA: Insufficient documentation

## 2012-06-26 DIAGNOSIS — R0789 Other chest pain: Secondary | ICD-10-CM | POA: Insufficient documentation

## 2012-06-26 LAB — CBC WITH DIFFERENTIAL/PLATELET
Basophils Absolute: 0 10*3/uL (ref 0.0–0.1)
Eosinophils Absolute: 0.1 10*3/uL (ref 0.0–0.7)
Eosinophils Relative: 1 % (ref 0–5)
HCT: 45.7 % (ref 39.0–52.0)
Lymphocytes Relative: 37 % (ref 12–46)
Lymphs Abs: 2.1 10*3/uL (ref 0.7–4.0)
MCH: 29.3 pg (ref 26.0–34.0)
MCV: 83.1 fL (ref 78.0–100.0)
Monocytes Absolute: 0.5 10*3/uL (ref 0.1–1.0)
Platelets: 236 10*3/uL (ref 150–400)
RDW: 13.3 % (ref 11.5–15.5)
WBC: 5.6 10*3/uL (ref 4.0–10.5)

## 2012-06-26 LAB — COMPREHENSIVE METABOLIC PANEL
CO2: 25 mEq/L (ref 19–32)
Calcium: 9.8 mg/dL (ref 8.4–10.5)
Creatinine, Ser: 0.93 mg/dL (ref 0.50–1.35)
GFR calc Af Amer: >90 mL/min (ref >90)
GFR calc non Af Amer: >90 mL/min (ref >90)
Glucose, Bld: 113 mg/dL — ABNORMAL HIGH (ref 70–99)
Sodium: 138 mEq/L (ref 135–145)
Total Protein: 8.2 g/dL (ref 6.0–8.3)

## 2012-06-26 LAB — POCT I-STAT TROPONIN I

## 2012-06-26 MED ORDER — IBUPROFEN 800 MG PO TABS: 800.0000 mg | ORAL_TABLET | Freq: Three times a day (TID) | ORAL | Status: DC

## 2012-06-26 MED ORDER — KETOROLAC TROMETHAMINE 60 MG/2ML IM SOLN
60.0000 mg | Freq: Once | INTRAMUSCULAR | Status: AC
  Administered 2012-06-26: 60 mg via INTRAMUSCULAR
  Filled 2012-06-26: qty 2

## 2012-06-26 NOTE — ED Notes (Signed)
Per pt family sts they are leaving and she is taking her husband to [place] since the wait is long and they live there anyway. Tried to convince otherwise

## 2012-06-26 NOTE — ED Notes (Signed)
Pt here for headache and jaw pain and shoulder pain. Started today. Seen at [facility] for chest pain yesterday and treated for acid reflux.

## 2012-06-26 NOTE — ED Provider Notes (Signed)
History     CSN: [id number]  Arrival date & time 06/26/12  1707   First MD Initiated Contact with Patient 06/26/12 1755      Chief Complaint  Patient presents with  . Headache    (Consider location/radiation/quality/duration/timing/severity/associated sxs/prior treatment) HPI Comments: Patient presents with sharp pain to the left side of the head and jaw that started earlier today.  He denies any nausea, blurry vision, or stiff neck.  No fevers.  Was seen by pcp yesterday for chest pain and was thought to be GI related.  [facility], however left due to an extended wait time.    Patient is a 46 y.o. male presenting with headaches. The history is provided by the patient.  Headache  This is a new problem. Episode onset: this morning. The problem occurs constantly. The problem has been gradually improving. The headache is associated with nothing. The pain is located in the left unilateral region. The quality of the pain is described as sharp. The pain is severe. Radiates to: left jaw. Associated symptoms include chest pressure. Pertinent negatives include no shortness of breath. He has tried nothing for the symptoms.    Past Medical History  Diagnosis Date  . Mixed hyperlipidemia   . Hypertension   . Bone spur     left foot  . OSA (obstructive sleep apnea)     s/p UPPP  . Murmur   . Acid reflux   . Stomach ulcer     Past Surgical History  Procedure Date  . Tonsillectomy     Family History  Problem Relation Age of Onset  . Dementia Mother   . Diabetes Mother   . Hypertension Mother   . Heart failure Mother   . Other Father     unknown  . Hypertension      siblings  . Other      no premature cad, colon ca, breast ca, or prostate ca    History  Substance Use Topics  . Smoking status: Never Smoker   . Smokeless tobacco: Never Used  . Alcohol Use: No      Review of Systems  Respiratory: Negative for cough and shortness of breath.     Cardiovascular: Positive for chest pain.  Neurological: Positive for headaches.  All other systems reviewed and are negative.    Allergies  Review of patient's allergies indicates no known allergies.  Home Medications   Current Outpatient Rx  Name Route Sig Dispense Refill  . AMLODIPINE BESYLATE 10 MG PO TABS  TAKE 1 TABLET BY MOUTH ONCE DAILY 90 tablet 1  . ESOMEPRAZOLE MAGNESIUM 40 MG PO CPDR Oral Take 1 capsule (40 mg total) by mouth daily. 90 capsule 1  . IBUPROFEN 800 MG PO TABS Oral Take 1 tablet (800 mg total) by mouth every 8 (eight) hours as needed. 20 tablet 0  . LISINOPRIL-HYDROCHLOROTHIAZIDE 20-12.5 MG PO TABS Oral Take 1 tablet by mouth daily. 90 tablet 1  . OMEPRAZOLE 40 MG PO CPDR Oral Take 1 capsule (40 mg total) by mouth daily. 30 capsule 0  . SIMVASTATIN 20 MG PO TABS Oral Take 1 tablet (20 mg total) by mouth at bedtime. 90 tablet 1  . SUCRALFATE 1 G PO TABS Oral Take 1 tablet (1 g total) by mouth 4 (four) times daily. 80 tablet 0  . TADALAFIL 20 MG PO TABS Oral Take 20 mg by mouth daily as needed. For erectile dysfunction  BP 145/96  Pulse 64  Temp 98.4 F (36.9 C) (Oral)  Resp 16  Ht 6' (1.829 m)  Wt 240 lb (108.863 kg)  BMI 32.55 kg/m2  SpO2 100%  Physical Exam  Nursing note and vitals reviewed. Constitutional: He is oriented to person, place, and time. He appears well-developed and well-nourished. No distress.  HENT:  Head: Normocephalic and atraumatic.  Mouth/Throat: Oropharynx is clear and moist.  Eyes: EOM are normal. Pupils are equal, round, and reactive to light.  Neck: Normal range of motion. Neck supple.  Cardiovascular: Normal rate and regular rhythm.   No murmur heard. Pulmonary/Chest: Effort normal and breath sounds normal.  Abdominal: Soft. Bowel sounds are normal. He exhibits no distension. There is no tenderness.  Musculoskeletal: Normal range of motion. He exhibits no edema.  Neurological: He is alert and oriented to person,  place, and time.  Skin: Skin is warm and dry. He is not diaphoretic.    ED Course  Procedures (including critical care time)  Labs Reviewed - No data to display No results found.   No diagnosis found.   Date: 06/26/2012  Rate: 79  Rhythm: normal sinus rhythm  QRS Axis: normal  Intervals: normal  ST/T Wave abnormalities: normal  Conduction Disutrbances:none  Narrative Interpretation:   Old EKG Reviewed: unchanged    MDM  The patient was seen yesterday for chest pain by his pcp, today is headache.  The ct looks okay and the ekg and troponin from [facility] performed earlier today were unremarkable as well.  At this point, the patient will be discharged to home, to follow up with Dr. Teressa Lower (who is the patient's cardiologist) tomorrow to discuss a stress test.        Geoffery Lyons, MD 06/26/12 Barry Brunner

## 2012-06-26 NOTE — ED Notes (Signed)
Pt c/o h/a and left jaw pain x 5 hrs today was  at [facility] early today but not seen labs and ekg done , seen at urgent care yesterday for chest pain

## 2012-06-27 NOTE — Telephone Encounter (Signed)
Current request denied as pt was seen in ER on 06/26/12 and received Rx.

## 2012-07-23 ENCOUNTER — Telehealth: Payer: Self-pay | Admitting: Internal Medicine

## 2012-07-23 ENCOUNTER — Ambulatory Visit (HOSPITAL_COMMUNITY)
Admission: RE | Admit: 2012-07-23 | Discharge: 2012-07-23 | Disposition: A | Discharge status: Home / Self Care | Payer: 59 | Source: Ambulatory Visit | Attending: Internal Medicine | Admitting: Internal Medicine

## 2012-07-23 ENCOUNTER — Encounter (HOSPITAL_COMMUNITY): Payer: Self-pay

## 2012-07-23 VITALS — BP 148/98 | HR 87 | Ht 72.0 in | Wt 248.8 lb

## 2012-07-23 DIAGNOSIS — R079 Chest pain, unspecified: Secondary | ICD-10-CM | POA: Insufficient documentation

## 2012-07-23 DIAGNOSIS — R0789 Other chest pain: Secondary | ICD-10-CM

## 2012-07-23 DIAGNOSIS — I10 Essential (primary) hypertension: Secondary | ICD-10-CM

## 2012-07-23 DIAGNOSIS — E782 Mixed hyperlipidemia: Secondary | ICD-10-CM | POA: Insufficient documentation

## 2012-07-23 DIAGNOSIS — G4733 Obstructive sleep apnea (adult) (pediatric): Secondary | ICD-10-CM | POA: Insufficient documentation

## 2012-07-23 DIAGNOSIS — K219 Gastro-esophageal reflux disease without esophagitis: Secondary | ICD-10-CM | POA: Insufficient documentation

## 2012-07-23 DIAGNOSIS — R072 Precordial pain: Secondary | ICD-10-CM | POA: Insufficient documentation

## 2012-07-23 NOTE — Assessment & Plan Note (Addendum)
I doubt this is cardiac but he has multiple CRFs including very high LDL and CRP. Would favor cardiac CT if we can get approval. If not, will need to schedule ETT. Pain also concerns me for possible duodenal ulcer. Avoid NSAIDs. Schedule f/u with GI. Continue PPI.

## 2012-07-23 NOTE — Progress Notes (Addendum)
Patient ID: [name] Foster, male   DOB: 1966-04-18, 47 y.o.   MRN: [id number] PCP: Dr Rodena Medin  HPI:  [name] Foster is 46 y/o male (husband of [name] Foster in our office) with HTN, HL, OSA s/p UPPP.   2011 ECHO EF 60% grade 2 diastolic dysfunction. Also had normal ETT.   He returns for follow up due to recent chest pain. Evaluated for chest pain 06/25/12 at [facility]. Troponin 0.04. ECG normal (I reviewed personally).  Says pain in middle of chest and goes to back. Lasted for 3 weeks; continuous. Worse with eating. No melena or BRBPR. Less noticeable with walking around. Worse when sitting, driving or if he pushes on it. Denies trauma. Nexium resumed. Added carafate. Now pain much improved.  Takes ibuprofen about 1x/week for back pain. Was taking Goody's Powder 1x/week but ran out 3 weeks ago. BP has been well controlled recently but was elevated in ER.   Had EGD 10 years and found to have ulcer.   Had has LDL > 170 with high hs-CRP. Statin doubled by Dr. Rodena Medin. Not taking routinely b/c he is afraid of memory loss.    ROS: All systems negative except as listed in HPI, PMH and Problem List.  Past Medical History  Diagnosis Date  . Mixed hyperlipidemia   . Hypertension   . Bone spur     left foot  . OSA (obstructive sleep apnea)     s/p UPPP  . Murmur   . Acid reflux   . Stomach ulcer     Current Outpatient Prescriptions  Medication Sig Dispense Refill  . amLODipine (NORVASC) 10 MG tablet TAKE 1 TABLET BY MOUTH ONCE DAILY  90 tablet  1  . esomeprazole (NEXIUM) 40 MG capsule Take 1 capsule (40 mg total) by mouth daily.  90 capsule  1  . ibuprofen (ADVIL,MOTRIN) 800 MG tablet Take 1 tablet (800 mg total) by mouth every 8 (eight) hours as needed.  20 tablet  0  . lisinopril-hydrochlorothiazide (PRINZIDE,ZESTORETIC) 20-12.5 MG per tablet Take 1 tablet by mouth daily.  90 tablet  1  . simvastatin (ZOCOR) 20 MG tablet Take 1 tablet (20 mg total) by mouth at bedtime.  90 tablet  1  .  sucralfate (CARAFATE) 1 G tablet Take 1 tablet (1 g total) by mouth 4 (four) times daily.  80 tablet  0  . tadalafil (CIALIS) 20 MG tablet Take 20 mg by mouth daily as needed. For erectile dysfunction      . omeprazole (PRILOSEC) 40 MG capsule Take 1 capsule (40 mg total) by mouth daily.  30 capsule  0     PHYSICAL EXAM: Filed Vitals:   07/23/12 1133  BP: 148/98  Pulse: 87   General:  Well appearing. No resp difficulty HEENT: normal Neck: supple. JVP flat. Carotids 2+ bilaterally; no bruits. No lymphadenopathy or thryomegaly appreciated. Chest: mild pain to palpation over sternum Cor: PMI normal. Regular rate & rhythm. No rubs, gallops. 2/6 TR murmurs Lungs: clear Abdomen: soft, obese nontender, nondistended.  No bruits or masses. Good bowel sounds. Extremities: no cyanosis, clubbing, rash, edema Neuro: alert & orientedx3, cranial nerves grossly intact. Moves all 4 extremities w/o difficulty. Affect pleasant.    ECG: NSR No ST-T wave abnormalities.     ASSESSMENT & PLAN:

## 2012-07-23 NOTE — Telephone Encounter (Signed)
Patient is scheduled for 07/30/12 11:00.  He is the husband of Merita Norton at Cardiology, and would like to come on this day due to scheduling with his Cardiac CT

## 2012-07-23 NOTE — Addendum Note (Signed)
Encounter addended by: Noralee Space, RN on: 07/23/2012 12:44 PM<BR>     Documentation filed: Patient Instructions Section, Orders

## 2012-07-23 NOTE — Patient Instructions (Addendum)
Your physician has requested that you have cardiac CT. Cardiac computed tomography (CT) is a painless test that uses an x-ray machine to take clear, detailed pictures of your heart. For further information please visit [email or web address] Please follow instruction sheet as given.  You have been referred to GI  We will contact you in 6 months to schedule your next appointment.

## 2012-07-23 NOTE — Assessment & Plan Note (Addendum)
BP remains elevated. Will double Lisinopril-HCTZ to 40/25. Will f/u with Dr. Rodena Medin. Will need BMEt in 2 weeks. Can add spironolactone as next line agent as needed.

## 2012-07-24 ENCOUNTER — Ambulatory Visit: Payer: 59 | Admitting: Internal Medicine

## 2012-07-24 DIAGNOSIS — Z0289 Encounter for other administrative examinations: Secondary | ICD-10-CM

## 2012-07-30 ENCOUNTER — Encounter: Payer: Self-pay | Admitting: Physician Assistant

## 2012-07-30 ENCOUNTER — Ambulatory Visit (INDEPENDENT_AMBULATORY_CARE_PROVIDER_SITE_OTHER): Payer: 59 | Admitting: Physician Assistant

## 2012-07-30 ENCOUNTER — Ambulatory Visit (HOSPITAL_COMMUNITY)
Admission: RE | Admit: 2012-07-30 | Discharge: 2012-07-30 | Disposition: A | Discharge status: Home / Self Care | Payer: 59 | Source: Ambulatory Visit | Attending: Internal Medicine | Admitting: Internal Medicine

## 2012-07-30 VITALS — BP 126/88 | HR 72 | Ht 71.25 in | Wt 248.0 lb

## 2012-07-30 DIAGNOSIS — R079 Chest pain, unspecified: Secondary | ICD-10-CM

## 2012-07-30 DIAGNOSIS — R0789 Other chest pain: Secondary | ICD-10-CM

## 2012-07-30 DIAGNOSIS — K219 Gastro-esophageal reflux disease without esophagitis: Secondary | ICD-10-CM

## 2012-07-30 MED ORDER — IOHEXOL 350 MG/ML SOLN
80.0000 mL | Freq: Once | INTRAVENOUS | Status: AC | PRN
  Administered 2012-07-30: 80 mL via INTRAVENOUS

## 2012-07-30 MED ORDER — METOPROLOL TARTRATE 1 MG/ML IV SOLN
5.0000 mg | INTRAVENOUS | Status: DC | PRN
  Administered 2012-07-30 (×2): 5 mg via INTRAVENOUS

## 2012-07-30 MED ORDER — SUCRALFATE 1 GM/10ML PO SUSP: 1.0000 g | Freq: Four times a day (QID) | ORAL | Status: DC

## 2012-07-30 MED ORDER — NITROGLYCERIN 0.4 MG SL SUBL
SUBLINGUAL_TABLET | SUBLINGUAL | Status: AC
  Administered 2012-07-30: 15:00:00
  Filled 2012-07-30: qty 25

## 2012-07-30 MED ORDER — METOPROLOL TARTRATE 1 MG/ML IV SOLN
INTRAVENOUS | Status: AC
  Filled 2012-07-30: qty 15

## 2012-07-30 NOTE — Patient Instructions (Addendum)
Stay on Nexium 40 mg once daily. We sent a prescription for Carafate liquid to [facility] [place]. Take no Antiinflammatories. Try taking Tylenol 1-2 times daily as needed for chest pain. You can use a heating pad at night. You have been scheduled for an endoscopy with propofol. Please follow written instructions given to you at your visit today. If you use inhalers (even only as needed) or a CPAP machine, please bring them with you on the day of your procedure.

## 2012-07-30 NOTE — Progress Notes (Signed)
I agree with assessment and plan.

## 2012-07-30 NOTE — Progress Notes (Signed)
Discharged home walking, with wife.

## 2012-07-30 NOTE — Progress Notes (Signed)
Subjective:    Patient ID: [name] Foster, male    DOB: Nov 15, 1965, 46 y.o.   MRN: [id number]  HPI  [name] Foster is a pleasant 46 year old African American male new to GI today referred by Dr. Clarise Cruz for evaluation of chest pain and acid reflux. He has recently undergone cardiac evaluation which thus far has been negative, however due to multiple risk factors he is scheduled to undergo stress testing. . Patient states that he was seen by a gastroenterologist in [place] about 10 years ago and had had an endoscopy at that time and was diagnosed with an ulcer. He was treated with Prilosec and says that he has been taking an acid blocker of some sort ever since then. He says he thinks that he is been on Nexium 40 mg daily at least over the past 6 years and says for the most part this has controlled his reflux symptoms. He is no complaint of dysphagia or odynophagia. Within the past couple of months he had been taking ibuprofen on regular basis and also some BCs 4 foot and back pain and then about a month ago started having fairly constant pain in his mid chest radiating through to his back He stopped taking the anti-inflammatories about 2 weeks ago. He really does not have much in the way of complaints of heartburn, and again no dysphagia or odynophagia. He does not feel that his chest pain is necessarily affected by eating, and he has had no nausea vomiting or abdominal pain. He says his chest pain is exacerbated by certain movements taken and he can also feel it when he takes a very deep breath. He works as a Hospital doctor for [employer], he is unaware of any injury straining etc. Recent labs done in 06/26/2012 show a CBC which was unremarkable and is seen at also within normal limits. He was started on Carafate tablets 4 times daily just within the past week but says he doesn't think she's taken and right because he has a hard time timing this to eating especially when he is working. His  chest pain again,  has been constant, and never has completely resolved over the past month, there is no exertional component..     Review of Systems  Constitutional: Negative.   HENT: Negative.   Eyes: Negative.   Respiratory: Negative.   Cardiovascular: Positive for chest pain.  Genitourinary: Negative.   Musculoskeletal: Positive for back pain.  Neurological: Negative.   Hematological: Negative.   Psychiatric/Behavioral: Negative.    Outpatient Prescriptions Prior to Visit  Medication Sig Dispense Refill  . amLODipine (NORVASC) 10 MG tablet TAKE 1 TABLET BY MOUTH ONCE DAILY  90 tablet  1  . esomeprazole (NEXIUM) 40 MG capsule Take 1 capsule (40 mg total) by mouth daily.  90 capsule  1  . ibuprofen (ADVIL,MOTRIN) 800 MG tablet Take 1 tablet (800 mg total) by mouth every 8 (eight) hours as needed.  20 tablet  0  . lisinopril-hydrochlorothiazide (PRINZIDE,ZESTORETIC) 20-12.5 MG per tablet Take 1 tablet by mouth daily.  90 tablet  1  . simvastatin (ZOCOR) 20 MG tablet Take 1 tablet (20 mg total) by mouth at bedtime.  90 tablet  1  . sucralfate (CARAFATE) 1 G tablet Take 1 tablet (1 g total) by mouth 4 (four) times daily.  80 tablet  0  . tadalafil (CIALIS) 20 MG tablet Take 20 mg by mouth daily as needed. For erectile dysfunction      . omeprazole (PRILOSEC)  40 MG capsule Take 1 capsule (40 mg total) by mouth daily.  30 capsule  0      No Known Allergies Patient Active Problem List  Diagnosis  . HYPERLIPIDEMIA-MIXED  . OBESITY  . HYPERTENSION, BENIGN  . HYPERTENSION, UNSPECIFIED  . HEEL PAIN, RIGHT  . CHEST PAIN, ATYPICAL  . Preventative health care  . Foot pain  . Flank pain  . Mesenteric panniculitis  . Routine general medical examination at a health care facility  . Back pain  . Substernal chest pain   History  Substance Use Topics  . Smoking status: Never Smoker   . Smokeless tobacco: Never Used  . Alcohol Use: No    Objective:   Physical Exam well-developed obese Afro-American  male in no acute distress, pleasant blood pressure 126/88 pulse 72 height 5 foot 11 weight 248. HEENT; nontraumatic normocephalic EOMI PERRLA sclera anicteric,Neck; Supple no JVD, Cardiovascular; regular rate and rhythm with S1-S2 no murmur or gallop, he is quite tender to palpation of the chest wall especially along the sternum and anterior ribs laterally. Pulmonary ;clear bilaterally, Abdomen; large soft nontender nondistended bowel sounds are active there is no palpable mass or hepatosplenomegaly, Rectal; exam not done, Extremities; no clubbing cyanosis or edema skin warm and dry, Psych; mood and affect normal and appropriate      Assessment & Plan:  #1  number  #44  46 year old male with one month history of fairly constant mid chest pain radiating to the back of unclear etiology. Initial cardiac evaluation has been negative but due to risk factors of hypertension hyperlipidemia and obesity he is scheduled to undergo further workup, I believe with stress testing Patient does have chronic GERD, but current symptoms are not consistent with poorly controlled acid reflux. He had been taking NSAIDs regularly, and therefore cannot rule out an NSAID-induced gastropathy or ulcer I believe most of his chest pain is musculoskeletal, and he may have a costochondritis type picture.  #2 chronic GERD #3 history of peptic ulcer disease remote #4 obesity #5 hypertension #6 hyperlipidemia  Plan; no NSAIDs for now Continue Nexium 40 mg by mouth every morning Change Carafate to liquid 1 g between meals and at bedtime Schedule for upper endoscopy with Dr. Jarold Motto; procedure discussed in detail with the patient and he is agreeable to proceed. Advised extra strength Tylenol once or twice daily for his musculoskeletal chest pain, and local heat at night. He may benefit from a course of an anti-inflammatory but hasn't and given his history. I explained that costochondritis symptoms can sometimes take a couple  of months to improve

## 2012-08-08 NOTE — Addendum Note (Signed)
Encounter addended by: Dolores Patty, MD on: 08/08/2012  9:16 PM<BR>     Documentation filed: Follow-up Section, LOS Section, Notes Section

## 2012-08-12 ENCOUNTER — Encounter: Payer: 59 | Admitting: Gastroenterology

## 2012-08-15 ENCOUNTER — Telehealth: Payer: Self-pay | Admitting: Gastroenterology

## 2012-08-15 NOTE — Telephone Encounter (Signed)
Lm for wife to call back

## 2012-08-16 ENCOUNTER — Encounter: Payer: Self-pay | Admitting: Gastroenterology

## 2012-08-16 ENCOUNTER — Ambulatory Visit (AMBULATORY_SURGERY_CENTER): Payer: 59 | Admitting: Gastroenterology

## 2012-08-16 VITALS — BP 135/79 | HR 76 | Temp 98.0°F | Resp 20 | Ht 71.0 in | Wt 248.0 lb

## 2012-08-16 DIAGNOSIS — K219 Gastro-esophageal reflux disease without esophagitis: Secondary | ICD-10-CM

## 2012-08-16 DIAGNOSIS — R079 Chest pain, unspecified: Secondary | ICD-10-CM

## 2012-08-16 MED ORDER — SODIUM CHLORIDE 0.9 % IV SOLN: 500.0000 mL | INTRAVENOUS | Status: DC

## 2012-08-16 NOTE — Patient Instructions (Addendum)
YOU HAD AN ENDOSCOPIC PROCEDURE TODAY AT THE [facility]: Refer to the procedure report that was given to you for any specific questions about what was found during the examination.  If the procedure report does not answer your questions, please call your gastroenterologist to clarify.  If you requested that your care partner not be given the details of your procedure findings, then the procedure report has been included in a sealed envelope for you to review at your convenience later.  YOU SHOULD EXPECT: Some feelings of bloating in the abdomen. Passage of more gas than usual.  Walking can help get rid of the air that was put into your GI tract during the procedure and reduce the bloating. If you had a lower endoscopy (such as a colonoscopy or flexible sigmoidoscopy) you may notice spotting of blood in your stool or on the toilet paper. If you underwent a bowel prep for your procedure, then you may not have a normal bowel movement for a few days.  DIET: Your first meal following the procedure should be a light meal and then it is ok to progress to your normal diet.  A half-sandwich or bowl of soup is an example of a good first meal.  Heavy or fried foods are harder to digest and may make you feel nauseous or bloated.  Likewise meals heavy in dairy and vegetables can cause extra gas to form and this can also increase the bloating.  Drink plenty of fluids but you should avoid alcoholic beverages for 24 hours.  ACTIVITY: Your care partner should take you home directly after the procedure.  You should plan to take it easy, moving slowly for the rest of the day.  You can resume normal activity the day after the procedure however you should NOT DRIVE or use heavy machinery for 24 hours (because of the sedation medicines used during the test).    SYMPTOMS TO REPORT IMMEDIATELY: A gastroenterologist can be reached at any hour.  During normal business hours, 8:30 AM to 5:00 PM Monday through Friday,  call [phone].  After hours and on weekends, please call the GI answering service at [phone] who will take a message and have the physician on call contact you.    Following upper endoscopy (EGD)  Vomiting of blood or coffee ground material  New chest pain or pain under the shoulder blades  Painful or persistently difficult swallowing  New shortness of breath  Fever of 100F or higher  Black, tarry-looking stools  FOLLOW UP: If any biopsies were taken you will be contacted by phone or by letter within the next 1-3 weeks.  Call your gastroenterologist if you have not heard about the biopsies in 3 weeks.  Our staff will call the home number listed on your records the next business day following your procedure to check on you and address any questions or concerns that you may have at that time regarding the information given to you following your procedure. This is a courtesy call and so if there is no answer at the home number and we have not heard from you through the emergency physician on call, we will assume that you have returned to your regular daily activities without incident.  SIGNATURES/CONFIDENTIALITY: You and/or your care partner have signed paperwork which will be entered into your electronic medical record.  These signatures attest to the fact that that the information above on your After Visit Summary has been reviewed and is understood.  Full   responsibility of the confidentiality of this discharge information lies with you and/or your care-partner.    Need referral for sleep apnea.  Continue PPI,   Hiatal hernia and GERD information given

## 2012-08-16 NOTE — Telephone Encounter (Signed)
Pt never called back; procedure is today at 4pm.

## 2012-08-16 NOTE — Op Note (Signed)
[facility] [address] [place], [address]   ENDOSCOPY PROCEDURE REPORT  PATIENT: [name] Foster, Samples  MR#: [id number] BIRTHDATE: 03-05-1966 , 46  yrs. old GENDER: Male ENDOSCOPIST:David Hale Bogus, MD, Clementeen Graham REFERRED BY: Mike Gip, PA-C PROCEDURE DATE:  08/16/2012 PROCEDURE:   EGD w/ biopsy for H.pylori ASA CLASS:    Class II INDICATIONS: Epigastric pain and History of esophageal reflux. MEDICATION: Propofol (Diprivan) 180 mg IV and Robinul 0.2 mg IV TOPICAL ANESTHETIC:   Cetacaine Spray  DESCRIPTION OF PROCEDURE:   After the risks and benefits of the procedure were explained, informed consent was obtained.  The LB [id number]-H180 [id number]  endoscope was introduced through the mouth  and advanced to the    .  The instrument was slowly withdrawn as the mucosa was fully examined.    The upper, middle and distal third of the esophagus were carefully inspected and no abnormalities were noted.  The z-line was well seen at the GEJ.  The endoscope was pushed into the fundus which was normal including a retroflexed view.  The antrum, gastric body, first and second part of the duodenum were unremarkable. A 4 cm. HH noted and free reflux noted. A CLO Bx. was done of the antrum. Retroflexed views revealed a hiatal hernia.    The scope was then withdrawn from the patient and the procedure completed.  COMPLICATIONS: There were no complications.   ENDOSCOPIC IMPRESSION: Normal EGD .Marland KitchenMarland KitchenHH and chronic GERD,R/O H.pylori. He has sleep apnea and needs referral and RX. for this [problem....i  RECOMMENDATIONS: 1.  Await pathology results 2.  Continue PPI 3, Cardic W/U in progress,also needs SLEEP APNEA REFERRAL !!!   _______________________________ eSignedMardella Layman, MD, [facility] 08/16/2012 3:43 PM   standard discharge   PATIENT NAME:  [name], [name] MR#: [id number]

## 2012-08-16 NOTE — Progress Notes (Signed)
Patient did not experience any of the following events: a burn prior to discharge; a fall within the facility; wrong site/side/patient/procedure/implant event; or a hospital transfer or hospital admission upon discharge from the facility. (G8907) Patient did not have preoperative order for IV antibiotic SSI prophylaxis. (G8918)  

## 2012-08-16 NOTE — Telephone Encounter (Signed)
Called wife again and she is off today. Left a message on home phone for either wife or pt to call be back with questions; pt has EGD scheduled today.

## 2012-08-19 ENCOUNTER — Telehealth: Payer: Self-pay | Admitting: *Deleted

## 2012-08-19 ENCOUNTER — Other Ambulatory Visit: Payer: Self-pay | Admitting: *Deleted

## 2012-08-19 DIAGNOSIS — R0681 Apnea, not elsewhere classified: Secondary | ICD-10-CM

## 2012-08-19 NOTE — Telephone Encounter (Signed)
Message copied by Florene Glen on Mon Aug 19, 2012  8:57 AM ------      Message from: Jessee Avers      Created: Fri Aug 16, 2012  3:58 PM       Per Dr. Jarold Motto patient needs a sleep apnea study with Pulmonary scheduled. Could not schedule on Friday because patient just had procedure in [facility]. Thanks.

## 2012-08-19 NOTE — Telephone Encounter (Signed)
  Follow up Call-  Call back number 08/16/2012  Post procedure Call Back phone  # [phone]  Permission to leave phone message Yes     Patient questions:  Message left to call if necessary.

## 2012-08-19 NOTE — Telephone Encounter (Signed)
Pt's wife had already scheduled the appt; I just needed to put the order in. Pt is scheduled at [facility] on 08/30/12.

## 2012-08-20 ENCOUNTER — Encounter: Payer: Self-pay | Admitting: Gastroenterology

## 2012-08-20 LAB — HELICOBACTER PYLORI SCREEN-BIOPSY: UREASE: NEGATIVE

## 2012-08-29 ENCOUNTER — Telehealth: Payer: Self-pay | Admitting: Gastroenterology

## 2012-08-29 DIAGNOSIS — R072 Precordial pain: Secondary | ICD-10-CM

## 2012-08-29 MED ORDER — ESOMEPRAZOLE MAGNESIUM 40 MG PO CPDR: 40.0000 mg | DELAYED_RELEASE_CAPSULE | Freq: Two times a day (BID) | ORAL | Status: DC

## 2012-08-29 NOTE — Telephone Encounter (Signed)
Ordered Abd U/S and wife is aware of the appt. She was instructed to tell pt to increase Nexium to BID and if the u/s is negative, we may need to order an EM. He will have the sleep study tomorrow night.

## 2012-08-29 NOTE — Telephone Encounter (Signed)
He needs to be on twice a day Nexium, had a sleep apnea study performed, and have esophageal manometry also.  He has had a negative cardiac workup.  If He has not had gallbladder ultrasound that all she should be performed.

## 2012-08-29 NOTE — Telephone Encounter (Signed)
Wife reports pt still has the mid sternal Chest Pain. He had a negative cardiac CT, basically normal EGD- H. Pylori negative. He is taking Carafate and Nexium as ordered and has changed his diet to avoid greasy and high fat foods.  Please advise. Thanks.

## 2012-08-30 ENCOUNTER — Ambulatory Visit (HOSPITAL_BASED_OUTPATIENT_CLINIC_OR_DEPARTMENT_OTHER): Payer: 59 | Attending: Gastroenterology | Admitting: Radiology

## 2012-08-30 ENCOUNTER — Ambulatory Visit (HOSPITAL_COMMUNITY): Payer: 59

## 2012-08-30 ENCOUNTER — Ambulatory Visit (HOSPITAL_COMMUNITY)
Admission: RE | Admit: 2012-08-30 | Discharge: 2012-08-30 | Disposition: A | Discharge status: Home / Self Care | Payer: 59 | Source: Ambulatory Visit | Attending: Gastroenterology | Admitting: Gastroenterology

## 2012-08-30 ENCOUNTER — Telehealth: Payer: Self-pay | Admitting: *Deleted

## 2012-08-30 VITALS — Ht 71.5 in | Wt 245.0 lb

## 2012-08-30 DIAGNOSIS — R0789 Other chest pain: Secondary | ICD-10-CM | POA: Insufficient documentation

## 2012-08-30 DIAGNOSIS — K219 Gastro-esophageal reflux disease without esophagitis: Secondary | ICD-10-CM | POA: Insufficient documentation

## 2012-08-30 DIAGNOSIS — R0681 Apnea, not elsewhere classified: Secondary | ICD-10-CM | POA: Insufficient documentation

## 2012-08-30 DIAGNOSIS — K7689 Other specified diseases of liver: Secondary | ICD-10-CM | POA: Insufficient documentation

## 2012-08-30 DIAGNOSIS — M549 Dorsalgia, unspecified: Secondary | ICD-10-CM | POA: Insufficient documentation

## 2012-08-30 DIAGNOSIS — R072 Precordial pain: Secondary | ICD-10-CM

## 2012-08-30 NOTE — Telephone Encounter (Signed)
Via fax from [facility] pharmacy Nexium as of 09-2012, will be $25 copay.  Protonix cost is $0.00.  Tried to contact patient but no answer and cannot leave message on vm   Told [place] pharmacy to continue Nexium until we hear from patient

## 2012-09-01 DIAGNOSIS — G4730 Sleep apnea, unspecified: Secondary | ICD-10-CM

## 2012-09-01 DIAGNOSIS — G4710 Hypersomnia, unspecified: Secondary | ICD-10-CM

## 2012-09-02 ENCOUNTER — Ambulatory Visit (HOSPITAL_COMMUNITY): Payer: 59

## 2012-09-02 ENCOUNTER — Institutional Professional Consult (permissible substitution): Payer: 59 | Admitting: Pulmonary Disease

## 2012-09-02 NOTE — Procedures (Signed)
[name], MANS NO.:  [id number]  MEDICAL RECORD NO.:  [id number]          PATIENT TYPE:  OUT  LOCATION:  SLEEP CENTER                 FACILITY:  [facility]  PHYSICIAN:  Barbaraann Share, MD,FCCPDATE OF BIRTH:  Mar [birth year] 1968  DATE OF STUDY:  08/30/2012                           NOCTURNAL POLYSOMNOGRAM  REFERRING PHYSICIAN:  Vania Rea. Jarold Motto, MD, Clementeen Graham, FACP, FAGA  INDICATION FOR STUDY:  Hypersomnia with sleep apnea.  EPWORTH SLEEPINESS SCORE:  5.  MEDICATIONS:  SLEEP ARCHITECTURE:  The patient had a total sleep time of 340 minutes with no slow-wave sleep and decreased quantity of REM.  Sleep onset latency was normal at 19 minutes and REM onset was normal at 61 minutes. Sleep efficiency was 66% during the diagnostic portion of the study, and 92% during the titration portion of the study.  RESPIRATORY DATA:  The patient underwent a split-night study, where he was found to have 46 obstructive events in the 1st 164 minutes of sleep. This gave him an apnea-hypopnea index of 17 events per hour during the diagnostic portion of the study.  The events occurred in all body positions, but were more prominent in the supine position.  There was loud snoring noted throughout.  By protocol, the patient was fitted with a medium ResMed Mirage Quattro full face mask, and CPAP titration was initiated.  He was found to have an optimal pressure of 9 cm of water to control both his obstructive events and snoring.  OXYGEN DATA:  There was O2 desaturation as low as 86% with the patient's obstructive events.  CARDIAC DATA:  No clinically significant arrhythmias were noted.  MOVEMENT-PARASOMNIA:  The patient had no significant leg jerks or other abnormal behaviors seen.  IMPRESSIONS-RECOMMENDATIONS:  Split night study reveals mild-to-moderate obstructive sleep apnea/hypopnea syndrome, with an AHI of 17 events per hour and oxygen desaturation as low as 86% during the diagnostic  portion of the study.  The patient was then fitted with a medium ResMed Mirage Quattro full face mask, and found to have an optimal CPAP pressure of 9 cm of water.  He should also be encouraged to work aggressively on weight loss.     Barbaraann Share, MD,FCCP Diplomate, American Board of Sleep Medicine    [facility]/MEDQ  D:  09/01/2012 16:59:26  T:  09/02/2012 01:46:17  Job:  [id number]

## 2012-09-18 ENCOUNTER — Telehealth: Payer: Self-pay | Admitting: Gastroenterology

## 2012-09-18 NOTE — Telephone Encounter (Signed)
Gave wife results of basically a normal [place]; she stated understanding.

## 2012-09-20 ENCOUNTER — Institutional Professional Consult (permissible substitution): Payer: 59 | Admitting: Pulmonary Disease

## 2012-10-07 ENCOUNTER — Ambulatory Visit (INDEPENDENT_AMBULATORY_CARE_PROVIDER_SITE_OTHER): Payer: 59 | Admitting: Pulmonary Disease

## 2012-10-07 ENCOUNTER — Encounter: Payer: Self-pay | Admitting: Pulmonary Disease

## 2012-10-07 VITALS — BP 118/82 | HR 82 | Temp 98.0°F | Ht 72.0 in | Wt 252.0 lb

## 2012-10-07 DIAGNOSIS — G4733 Obstructive sleep apnea (adult) (pediatric): Secondary | ICD-10-CM

## 2012-10-07 NOTE — Patient Instructions (Addendum)
Will set up for cpap at a moderate pressure level.  Please call if having issues with tolerance Work on weight loss followup with me in 6 weeks.

## 2012-10-07 NOTE — Progress Notes (Signed)
  Subjective:    Patient ID: [name] Foster, male    DOB: 09-20-1965, 47 y.o.   MRN: [id number]  HPI The patient is a 47 year old male who I have been asked to see for management of obstructive sleep apnea.  He recently underwent a sleep study last month that showed mild to moderate OSA.  He had an AHI of 17 events per hour, and ultimately was titrated to a pressure of 9 cm of water with some breakthrough.  The patient was noted to have apnea during a recent endoscopy, and this started the above workup.  The patient has been noted to have loud snoring by his wife, but she has not mentioned an abnormal breathing pattern during sleep.  He has frequent awakenings during the night, and is not rested in the mornings upon arising.  He does note some sleep pressure during the day with inactivity, but it is not overly significant.  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  He can also have dozing during the evenings watching television.  The patient states that his weight is up 15 pounds over the last 2 years, and his Epworth score today is only 4.  Sleep Questionnaire: What time do you typically go to bed?( Between what hours) 10-11pm How long does it take you to fall asleep? How many times during the night do you wake up? 3 What time do you get out of bed to start your day? 0500 Do you drive or operate heavy machinery in your occupation? Yes How much has your weight changed (up or down) over the past two years? (In pounds) 15 lb (6.804 kg) Have you ever had a sleep study before? Yes If yes, location of study? [place] If yes, date of study? 08/30/12 Do you currently use CPAP? No Do you wear oxygen at any time? No    Review of Systems  Constitutional: Negative for fever and unexpected weight change.  HENT: Negative for ear pain, nosebleeds, congestion, sore throat, rhinorrhea, sneezing, trouble swallowing, dental problem, postnasal drip and sinus pressure.     Eyes: Negative for redness and itching.  Respiratory: Negative for cough, chest tightness, shortness of breath and wheezing.   Cardiovascular: Negative for palpitations and leg swelling.  Gastrointestinal: Negative for nausea and vomiting.  Genitourinary: Negative for dysuria.  Musculoskeletal: Negative for joint swelling.  Skin: Negative for rash.  Neurological: Positive for headaches.  Hematological: Does not bruise/bleed easily.  Psychiatric/Behavioral: Negative for dysphoric mood. The patient is not nervous/anxious.        Objective:   Physical Exam Constitutional:  Overweight male, no acute distress  HENT:  Nares patent without discharge, but narrowed with large turbs.   Oropharynx without exudate, trimmed palate and uvula.   Eyes:  Perrla, eomi, no scleral icterus  Neck:  No JVD, no TMG  Cardiovascular:  Normal rate, regular rhythm, no rubs or gallops.  No murmurs        Intact distal pulses  Pulmonary :  Normal breath sounds, no stridor or respiratory distress   No rales, rhonchi, or wheezing  Abdominal:  Soft, nondistended, bowel sounds present.  No tenderness noted.   Musculoskeletal:  No lower extremity edema noted.  Lymph Nodes:  No cervical lymphadenopathy noted  Skin:  No cyanosis noted  Neurologic:  Alert, appropriate, moves all 4 extremities without obvious deficit.         Assessment & Plan:

## 2012-10-07 NOTE — Assessment & Plan Note (Signed)
The patient has mild to moderate obstructive sleep apnea by his recent studies, and clearly does have symptoms during the day and in the evening.  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  I think CPAP coupled with weight loss is his best treatment, and the patient is agreeable.  If for some reason he does not tolerate CPAP, he could consider a dental appliance. I will set the patient up on cpap at a moderate pressure level to allow for desensitization, and will troubleshoot the device over the next 4-6weeks if needed.  The pt is to call me if having issues with tolerance.  Will then optimize the pressure once patient is able to wear cpap on a consistent basis.

## 2012-10-14 ENCOUNTER — Encounter: Payer: Self-pay | Admitting: Family Medicine

## 2012-10-14 ENCOUNTER — Ambulatory Visit (INDEPENDENT_AMBULATORY_CARE_PROVIDER_SITE_OTHER): Payer: 59 | Admitting: Family Medicine

## 2012-10-14 VITALS — BP 142/92 | HR 68 | Temp 98.5°F | Ht 72.0 in | Wt 249.4 lb

## 2012-10-14 DIAGNOSIS — E785 Hyperlipidemia, unspecified: Secondary | ICD-10-CM

## 2012-10-14 DIAGNOSIS — R0789 Other chest pain: Secondary | ICD-10-CM

## 2012-10-14 DIAGNOSIS — M549 Dorsalgia, unspecified: Secondary | ICD-10-CM

## 2012-10-14 DIAGNOSIS — R0681 Apnea, not elsewhere classified: Secondary | ICD-10-CM

## 2012-10-14 DIAGNOSIS — E669 Obesity, unspecified: Secondary | ICD-10-CM

## 2012-10-14 DIAGNOSIS — R079 Chest pain, unspecified: Secondary | ICD-10-CM

## 2012-10-14 DIAGNOSIS — IMO0001 Reserved for inherently not codable concepts without codable children: Secondary | ICD-10-CM

## 2012-10-14 DIAGNOSIS — K219 Gastro-esophageal reflux disease without esophagitis: Secondary | ICD-10-CM

## 2012-10-14 DIAGNOSIS — I10 Essential (primary) hypertension: Secondary | ICD-10-CM

## 2012-10-14 DIAGNOSIS — G4733 Obstructive sleep apnea (adult) (pediatric): Secondary | ICD-10-CM

## 2012-10-14 MED ORDER — RANITIDINE HCL 300 MG PO TABS: 300.0000 mg | ORAL_TABLET | Freq: Every day | ORAL | Status: DC

## 2012-10-14 MED ORDER — CYCLOBENZAPRINE HCL 10 MG PO TABS: 10.0000 mg | ORAL_TABLET | Freq: Every evening | ORAL | Status: DC | PRN

## 2012-10-14 MED ORDER — ALPRAZOLAM 0.25 MG PO TABS: 0.2500 mg | ORAL_TABLET | Freq: Two times a day (BID) | ORAL | Status: DC | PRN

## 2012-10-14 NOTE — Patient Instructions (Addendum)

## 2012-10-15 ENCOUNTER — Telehealth: Payer: Self-pay | Admitting: *Deleted

## 2012-10-15 ENCOUNTER — Ambulatory Visit: Payer: 59 | Admitting: Family Medicine

## 2012-10-15 ENCOUNTER — Encounter: Payer: Self-pay | Admitting: Family Medicine

## 2012-10-15 NOTE — Assessment & Plan Note (Signed)
Mild elevation no concerns today

## 2012-10-15 NOTE — Assessment & Plan Note (Signed)
Tolerating Simvastain, recheck lipids prior to next visit, avoid trans fats

## 2012-10-15 NOTE — Assessment & Plan Note (Signed)
Patient awaiting CPAP machine delivery

## 2012-10-15 NOTE — Assessment & Plan Note (Signed)
Unclear etiology. He describes it as pressure from his whole chest which lasts for hours when it occurs. He has symptoms more days and he doesn't. So far cardiac and GI workup had been unremarkable. He is describing sometimes a sensation of needing to cough and difficulty getting a good breath at times. Will have him start a new acid suppressant for possible atypical reflux check a 2-D echo at this time he will report worsening or concerning symptoms.

## 2012-10-15 NOTE — Assessment & Plan Note (Signed)
Encouraged DASH and decreased po intake

## 2012-10-15 NOTE — Telephone Encounter (Signed)
FYI:  Patient has chosen to D/C CPAP d/t cost of machine. Letter from [facility] is in your folder for review.

## 2012-10-15 NOTE — Telephone Encounter (Signed)
Noted  

## 2012-10-15 NOTE — Progress Notes (Signed)
Patient ID: [name] Foster, male   DOB: 05-20-1966, 47 y.o.   MRN: [id number] [name] Foster [id number] 11-01-1965 10/15/2012      Progress Note-Follow Up  Subjective  Chief Complaint  Chief Complaint  Patient presents with  . Follow-up    HPI  Patient is a 46 year old male who is in today for followup. His largest complaint is of persistent chest pain. He describes the chest pain is having more days and it does not. Quite often when it comes it sticks around for a while sometimes hours. Smokeless pressure throughout his chest. This was visualized down. He denies any associated symptoms such as palpitations, diaphoresis, nausea or shortness of breath. He does occasionally have some dyspepsia but no sour fluid in his mouth or choking. No fevers or chills. No GI or GU complaints noted today appear  Past Medical History  Diagnosis Date  . Mixed hyperlipidemia   . Hypertension   . Bone spur     left foot  . OSA (obstructive sleep apnea)     s/p UPPP  . Murmur   . Acid reflux   . Stomach ulcer     from PCP    Past Surgical History  Procedure Date  . Tonsillectomy   . Uvulectomy     Family History  Problem Relation Age of Onset  . Dementia Mother   . Diabetes Mother   . Hypertension Mother   . Heart failure Mother   . Hypertension      siblings  . Colon cancer Neg Hx     History   Social History  . Marital Status: Married    Spouse Name: N/A    Number of Children: N/A  . Years of Education: N/A   Occupational History  . Engineer, mining    Social History Main Topics  . Smoking status: Never Smoker   . Smokeless tobacco: Never Used  . Alcohol Use: No  . Drug Use: No  . Sexually Active: Yes   Other Topics Concern  . Not on file   Social History Narrative   Tobacco Use - No. Full Time- Midwife [employer] [place] areaMarried - 13 yearsAlcohol Use - noRegular Exercise - yesDrug Use - no3 girls 3 boysSmoking Status:  quitPacks/Day:   <0.25Caffeine use/day:  1 cup coffee every other dayDoes Patient Exercise:  no    Current Outpatient Prescriptions on File Prior to Visit  Medication Sig Dispense Refill  . amLODipine (NORVASC) 10 MG tablet TAKE 1 TABLET BY MOUTH ONCE DAILY  90 tablet  1  . esomeprazole (NEXIUM) 40 MG capsule Take 1 capsule (40 mg total) by mouth 2 (two) times daily.  60 capsule  1  . ibuprofen (ADVIL,MOTRIN) 800 MG tablet Take 1 tablet (800 mg total) by mouth every 8 (eight) hours as needed.  20 tablet  0  . lisinopril-hydrochlorothiazide (PRINZIDE,ZESTORETIC) 20-12.5 MG per tablet Take 1 tablet by mouth daily.  90 tablet  1  . simvastatin (ZOCOR) 20 MG tablet Take 1 tablet (20 mg total) by mouth at bedtime.  90 tablet  1  . sucralfate (CARAFATE) 1 G tablet Take 1 tablet (1 g total) by mouth 4 (four) times daily.  80 tablet  0  . tadalafil (CIALIS) 20 MG tablet Take 20 mg by mouth daily as needed. For erectile dysfunction      . ranitidine (ZANTAC) 300 MG tablet Take 1 tablet (300 mg total) by mouth at bedtime.  30  tablet  2    No Known Allergies  Review of Systems  Review of Systems  Constitutional: Negative for fever and malaise/fatigue.  HENT: Negative for congestion.   Eyes: Negative for discharge.  Respiratory: Negative for shortness of breath.   Cardiovascular: Positive for chest pain. Negative for palpitations and leg swelling.  Gastrointestinal: Positive for heartburn. Negative for nausea, abdominal pain and diarrhea.  Genitourinary: Negative for dysuria.  Musculoskeletal: Negative for falls.  Skin: Negative for rash.  Neurological: Negative for loss of consciousness and headaches.  Endo/Heme/Allergies: Negative for polydipsia.  Psychiatric/Behavioral: Negative for depression and suicidal ideas. The patient is not nervous/anxious and does not have insomnia.     Objective  BP 142/92  Pulse 68  Temp 98.5 F (36.9 C) (Oral)  Ht 6' (1.829 m)  Wt 249 lb 6.4 oz (113.127 kg)  BMI 33.82  kg/m2  SpO2 96%  Physical Exam  Physical Exam  Constitutional: He is oriented to person, place, and time and well-developed, well-nourished, and in no distress. No distress.  HENT:  Head: Normocephalic and atraumatic.  Eyes: Conjunctivae normal are normal.  Neck: Neck supple. No thyromegaly present.  Cardiovascular: Normal rate, regular rhythm and normal heart sounds.   No murmur heard. Pulmonary/Chest: Effort normal and breath sounds normal. No respiratory distress.  Abdominal: He exhibits no distension and no mass. There is no tenderness.  Musculoskeletal: He exhibits no edema.  Neurological: He is alert and oriented to person, place, and time.  Skin: Skin is warm.  Psychiatric: Memory, affect and judgment normal.    Lab Results  Component Value Date   TSH 1.641 02/28/2012   Lab Results  Component Value Date   WBC 5.6 06/26/2012   HGB 16.1 06/26/2012   HCT 45.7 06/26/2012   MCV 83.1 06/26/2012   PLT 236 06/26/2012   Lab Results  Component Value Date   CREATININE 0.93 06/26/2012   BUN 12 06/26/2012   NA 138 06/26/2012   K 3.6 06/26/2012   CL 101 06/26/2012   CO2 25 06/26/2012   Lab Results  Component Value Date   ALT 34 06/26/2012   AST 26 06/26/2012   ALKPHOS 70 06/26/2012   BILITOT 0.3 06/26/2012   Lab Results  Component Value Date   CHOL 242* 02/28/2012   Lab Results  Component Value Date   HDL 45 02/28/2012   Lab Results  Component Value Date   LDLCALC 177* 02/28/2012   Lab Results  Component Value Date   TRIG 102 02/28/2012   Lab Results  Component Value Date   CHOLHDL 5.4 02/28/2012     Assessment & Plan  CHEST PAIN, ATYPICAL Unclear etiology. He describes it as pressure from his whole chest which lasts for hours when it occurs. He has symptoms more days and he doesn't. So far cardiac and GI workup had been unremarkable. He is describing sometimes a sensation of needing to cough and difficulty getting a good breath at times. Will have him  start a new acid suppressant for possible atypical reflux check a 2-D echo at this time he will report worsening or concerning symptoms.  HYPERTENSION, UNSPECIFIED Mild elevation no concerns today  HYPERLIPIDEMIA-MIXED Tolerating Simvastain, recheck lipids prior to next visit, avoid trans fats  OSA (obstructive sleep apnea) Patient awaiting CPAP machine delivery  OBESITY Encouraged DASH and decreased po intake

## 2012-10-18 ENCOUNTER — Telehealth: Payer: Self-pay | Admitting: *Deleted

## 2012-10-18 MED ORDER — AMOXICILLIN 500 MG PO CAPS: ORAL_CAPSULE | ORAL | Status: DC

## 2012-10-18 NOTE — Telephone Encounter (Signed)
So not all murmurs require antibiotic prophylaxis in fact most do not. We can investigate with an echo later. For now since we are just getting to know him He can have Amoxicillin 500 mg tabs 4 tabs 1 hour prior to dental work, disp #4 with 1 rf

## 2012-10-18 NOTE — Telephone Encounter (Signed)
Received call from pt's wife stating pt has dental cleaning on Monday with [facility] on [address]. She states they told pt they want him to have prophylactic antibiotics prior to the cleaning. She requests antibiotic be sent to [facility] pharmacy. I did not see murmur listed on his historical diagnosis but pt's wife states Dr Abner Greenspan mentioned hearing it at his last exam. Please advise.

## 2012-10-18 NOTE — Telephone Encounter (Signed)
Merita Norton notified and pharmacy changed to [facility] as she was told [facility] pharmacy closing early.  Verified with Romeo Apple at [facility] they are open and computer is working fine.  RX sent.

## 2012-10-23 ENCOUNTER — Other Ambulatory Visit (HOSPITAL_COMMUNITY): Payer: 59

## 2012-10-28 ENCOUNTER — Ambulatory Visit (HOSPITAL_COMMUNITY)
Admission: RE | Admit: 2012-10-28 | Discharge: 2012-10-28 | Disposition: A | Discharge status: Home / Self Care | Payer: 59 | Source: Ambulatory Visit | Attending: Family Medicine | Admitting: Family Medicine

## 2012-10-28 ENCOUNTER — Other Ambulatory Visit (HOSPITAL_COMMUNITY): Payer: 59

## 2012-10-28 DIAGNOSIS — E7800 Pure hypercholesterolemia, unspecified: Secondary | ICD-10-CM | POA: Insufficient documentation

## 2012-10-28 DIAGNOSIS — I517 Cardiomegaly: Secondary | ICD-10-CM

## 2012-10-28 DIAGNOSIS — I10 Essential (primary) hypertension: Secondary | ICD-10-CM | POA: Insufficient documentation

## 2012-10-28 DIAGNOSIS — Z87891 Personal history of nicotine dependence: Secondary | ICD-10-CM | POA: Insufficient documentation

## 2012-10-28 DIAGNOSIS — I379 Nonrheumatic pulmonary valve disorder, unspecified: Secondary | ICD-10-CM | POA: Insufficient documentation

## 2012-10-28 DIAGNOSIS — R079 Chest pain, unspecified: Secondary | ICD-10-CM | POA: Insufficient documentation

## 2012-10-28 NOTE — Progress Notes (Signed)
  Echocardiogram 2D Echocardiogram has been performed.  [name] Foster 10/28/2012, 11:11 AM

## 2012-10-29 NOTE — Progress Notes (Signed)
Quick Note:  Patients spouse informed and voiced understanding. Pts spouse also states she works at [employer] and will check on cpap cost because the one that contacted them was to much. Pts wife also stated pt sees a cardiologist at her work and she will set an appt up for him ______

## 2012-10-30 ENCOUNTER — Telehealth: Payer: Self-pay | Admitting: Family Medicine

## 2012-10-30 NOTE — Telephone Encounter (Signed)
Left a detailed message on cell phone.   I did state for pt to return my call if he did want [place] to continue with making the appt due to his wife stating yesterday not to that pt already saw a cardiologist at her work and she would make an appt.

## 2012-10-30 NOTE — Telephone Encounter (Signed)
Patient is requesting a CB explaining echocardiogram results. He is requesting a CB as long as it is not between 11:50 & 1:20. He also said it is okay to leave a detailed mess on his cell phone. He is also requesting [place] to make a cardiology appt.

## 2012-11-11 ENCOUNTER — Ambulatory Visit: Payer: 59 | Admitting: Family Medicine

## 2012-11-16 ENCOUNTER — Ambulatory Visit (INDEPENDENT_AMBULATORY_CARE_PROVIDER_SITE_OTHER): Payer: 59 | Admitting: Internal Medicine

## 2012-11-16 VITALS — BP 130/100 | HR 77 | Temp 98.2°F | Resp 18 | Wt 257.0 lb

## 2012-11-16 DIAGNOSIS — J029 Acute pharyngitis, unspecified: Secondary | ICD-10-CM

## 2012-11-16 DIAGNOSIS — J020 Streptococcal pharyngitis: Secondary | ICD-10-CM

## 2012-11-16 LAB — POCT RAPID STREP A (OFFICE): Rapid Strep A Screen: NEGATIVE

## 2012-11-16 MED ORDER — ACETAMINOPHEN 500 MG PO TABS: 500.0000 mg | ORAL_TABLET | Freq: Four times a day (QID) | ORAL | Status: DC | PRN

## 2012-11-16 NOTE — Progress Notes (Signed)
  Subjective:    Patient ID: [name] Foster, male    DOB: 05-04-1966, 47 y.o.   MRN: [id number]  HPI Caught illness from wife Sore throat for 3 days [name] No runny nose/no cough/no fever  Past medical history includes hypertension and sleep apnea  Review of Systems     Objective:   Physical Exam TMs clear Nares clear Throat red/status post surgery and including  uvuloplasty No nodes  Results for orders placed in visit on 11/16/12  POCT RAPID STREP A (OFFICE)      Result Value Range   Rapid Strep A Screen Negative  Negative       Assessment & Plan:  Viral pharyngitis  OTC treatment

## 2012-11-18 ENCOUNTER — Ambulatory Visit: Payer: 59 | Admitting: Pulmonary Disease

## 2012-11-21 ENCOUNTER — Telehealth: Payer: Self-pay | Admitting: Pulmonary Disease

## 2012-11-22 NOTE — Telephone Encounter (Signed)
Order refaxed to [facility] per wife's request Tobe Sos

## 2012-11-29 ENCOUNTER — Telehealth: Payer: Self-pay | Admitting: Family Medicine

## 2012-11-29 ENCOUNTER — Telehealth: Payer: Self-pay | Admitting: Pulmonary Disease

## 2012-11-29 MED ORDER — AMLODIPINE BESYLATE 10 MG PO TABS: ORAL_TABLET | ORAL | Status: DC

## 2012-11-29 NOTE — Telephone Encounter (Signed)
Refill sent. Pt is due for follow up. Please call pt to arrange appt.

## 2012-11-29 NOTE — Telephone Encounter (Signed)
I spoke with Angelica Chessman and she states to disregard the message, they have the info needed. Carron Curie, CMA

## 2012-11-29 NOTE — Telephone Encounter (Signed)
Refill- amlodipine besylate 10mg  tablet. Take one tablet by mouth once daily. Qty 90 last fill 4.24.13

## 2012-12-04 NOTE — Telephone Encounter (Signed)
Informed patient of med refill and he scheduled an appointment for 01/14/13

## 2012-12-19 ENCOUNTER — Encounter (HOSPITAL_COMMUNITY): Payer: Self-pay | Admitting: Cardiology

## 2012-12-25 ENCOUNTER — Other Ambulatory Visit: Payer: Self-pay

## 2012-12-25 MED ORDER — TADALAFIL 20 MG PO TABS
20.0000 mg | ORAL_TABLET | Freq: Every day | ORAL | Status: DC | PRN
Start: 2012-12-25 — End: 2013-01-14

## 2012-12-25 NOTE — Telephone Encounter (Signed)
Please advise Cialis refill?

## 2013-01-14 ENCOUNTER — Ambulatory Visit (INDEPENDENT_AMBULATORY_CARE_PROVIDER_SITE_OTHER): Payer: 59 | Admitting: Family Medicine

## 2013-01-14 ENCOUNTER — Encounter: Payer: Self-pay | Admitting: Family Medicine

## 2013-01-14 VITALS — BP 116/90 | HR 75 | Temp 98.1°F | Ht 72.0 in | Wt 249.1 lb

## 2013-01-14 DIAGNOSIS — K219 Gastro-esophageal reflux disease without esophagitis: Secondary | ICD-10-CM

## 2013-01-14 DIAGNOSIS — E669 Obesity, unspecified: Secondary | ICD-10-CM

## 2013-01-14 DIAGNOSIS — I10 Essential (primary) hypertension: Secondary | ICD-10-CM

## 2013-01-14 DIAGNOSIS — E785 Hyperlipidemia, unspecified: Secondary | ICD-10-CM

## 2013-01-14 DIAGNOSIS — R079 Chest pain, unspecified: Secondary | ICD-10-CM

## 2013-01-14 DIAGNOSIS — G4733 Obstructive sleep apnea (adult) (pediatric): Secondary | ICD-10-CM

## 2013-01-14 DIAGNOSIS — R1013 Epigastric pain: Secondary | ICD-10-CM

## 2013-01-14 DIAGNOSIS — N529 Male erectile dysfunction, unspecified: Secondary | ICD-10-CM

## 2013-01-14 MED ORDER — HYOSCYAMINE SULFATE 0.125 MG SL SUBL: 0.1250 mg | SUBLINGUAL_TABLET | SUBLINGUAL | Status: DC | PRN

## 2013-01-14 MED ORDER — TADALAFIL 5 MG PO TABS: 5.0000 mg | ORAL_TABLET | Freq: Every day | ORAL | Status: DC | PRN

## 2013-01-14 MED ORDER — OMEPRAZOLE-SODIUM BICARBONATE 40-1100 MG PO CAPS: 1.0000 | ORAL_CAPSULE | Freq: Every day | ORAL | Status: DC

## 2013-01-14 NOTE — Patient Instructions (Addendum)
Next visit annual labs prior lipid, renal, cbc, hgba1c, psa, hepatic, tsh  DASH Diet The DASH diet stands for "Dietary Approaches to Stop Hypertension." It is a healthy eating plan that has been shown to reduce high blood pressure (hypertension) in as little as 14 days, while also possibly providing other significant health benefits. These other health benefits include reducing the risk of breast cancer after menopause and reducing the risk of type 2 diabetes, heart disease, colon cancer, and stroke. Health benefits also include weight loss and slowing kidney failure in patients with chronic kidney disease.  DIET GUIDELINES  Limit salt (sodium). Your diet should contain less than 1500 mg of sodium daily.  Limit refined or processed carbohydrates. Your diet should include mostly whole grains. Desserts and added sugars should be used sparingly.  Include small amounts of heart-healthy fats. These types of fats include nuts, oils, and tub margarine. Limit saturated and trans fats. These fats have been shown to be harmful in the body. CHOOSING FOODS  The following food groups are based on a 2000 calorie diet. See your Registered Dietitian for individual calorie needs. Grains and Grain Products (6 to 8 servings daily)  Eat More Often: Whole-wheat bread, brown rice, whole-grain or wheat pasta, quinoa, popcorn without added fat or salt (air popped).  Eat Less Often: White bread, white pasta, white rice, cornbread. Vegetables (4 to 5 servings daily)  Eat More Often: Fresh, frozen, and canned vegetables. Vegetables may be raw, steamed, roasted, or grilled with a minimal amount of fat.  Eat Less Often/Avoid: Creamed or fried vegetables. Vegetables in a cheese sauce. Fruit (4 to 5 servings daily)  Eat More Often: All fresh, canned (in natural juice), or frozen fruits. Dried fruits without added sugar. One hundred percent fruit juice ( cup [237 mL] daily).  Eat Less Often: Dried fruits with added  sugar. Canned fruit in light or heavy syrup. [employer], Fish, and Poultry (2 servings or less daily. One serving is 3 to 4 oz [85-114 g]).  Eat More Often: Ninety percent or leaner ground beef, tenderloin, sirloin. Round cuts of beef, chicken breast, [place] breast. All fish. Grill, bake, or broil your meat. Nothing should be fried.  Eat Less Often/Avoid: Fatty cuts of meat, [place], or chicken leg, thigh, or wing. Fried cuts of meat or fish. Dairy (2 to 3 servings)  Eat More Often: Low-fat or fat-free milk, low-fat plain or light yogurt, reduced-fat or part-skim cheese.  Eat Less Often/Avoid: Milk (whole, 2%).Whole milk yogurt. Full-fat cheeses. Nuts, Seeds, and Legumes (4 to 5 servings per week)  Eat More Often: All without added salt.  Eat Less Often/Avoid: Salted nuts and seeds, canned beans with added salt. Fats and Sweets (limited)  Eat More Often: Vegetable oils, tub margarines without trans fats, sugar-free gelatin. Mayonnaise and salad dressings.  Eat Less Often/Avoid: Coconut oils, palm oils, butter, stick margarine, cream, half and half, cookies, candy, pie. FOR MORE INFORMATION The Dash Diet Eating Plan: www.dashdiet.org Document Released: 08/17/2011 Document Revised: 11/20/2011 Document Reviewed: 08/17/2011 [facility] Patient Information 2013 [place], [place].

## 2013-01-15 ENCOUNTER — Encounter: Payer: Self-pay | Admitting: Family Medicine

## 2013-01-15 DIAGNOSIS — K219 Gastro-esophageal reflux disease without esophagitis: Secondary | ICD-10-CM

## 2013-01-15 DIAGNOSIS — N529 Male erectile dysfunction, unspecified: Secondary | ICD-10-CM

## 2013-01-15 HISTORY — DX: Gastro-esophageal reflux disease without esophagitis: K21.9

## 2013-01-15 HISTORY — DX: Male erectile dysfunction, unspecified: N52.9

## 2013-01-15 NOTE — Progress Notes (Signed)
Patient ID: [name] Foster, male   DOB: 1966/03/09, 47 y.o.   MRN: [id number] Shaquille Janes Snee [id number] December 05, 1965 01/15/2013      Progress Note-Follow Up  Subjective  Chief Complaint  Chief Complaint  Patient presents with  . Medication Refill    HPI  Patient is a 47 year old African American male is here today in followup. His reflux has not fully controlled. He has frequent dyspepsia and substernal chest discomfort when he is experiencing this. His bowels are moving well. He denies anorexia. He denies vomiting or diarrhea. No recent illness fevers congestion or GU complaints.  Past Medical History  Diagnosis Date  . Mixed hyperlipidemia   . Hypertension   . Bone spur     left foot  . OSA (obstructive sleep apnea)     s/p UPPP  . Murmur   . Acid reflux   . Stomach ulcer     from PCP  . Erectile dysfunction 01/15/2013  . Esophageal reflux 01/15/2013    Past Surgical History  Procedure Laterality Date  . Tonsillectomy    . Uvulectomy      Family History  Problem Relation Age of Onset  . Dementia Mother   . Diabetes Mother   . Hypertension Mother   . Heart failure Mother   . Hypertension      siblings  . Colon cancer Neg Hx     History   Social History  . Marital Status: Married    Spouse Name: N/A    Number of Children: N/A  . Years of Education: N/A   Occupational History  . Engineer, mining    Social History Main Topics  . Smoking status: Never Smoker   . Smokeless tobacco: Never Used  . Alcohol Use: No  . Drug Use: No  . Sexually Active: Yes   Other Topics Concern  . Not on file   Social History Narrative   Tobacco Use - No.    Full Time- [employer])   grew up in [place] area   Married - 13 years   Alcohol Use - no   Regular Exercise - yes   Drug Use - no   3 girls    3 boys   Smoking Status:  quit   Packs/Day:  <0.25   Caffeine use/day:  1 cup coffee every other day   Does Patient Exercise:  no    Current Outpatient  Prescriptions on File Prior to Visit  Medication Sig Dispense Refill  . acetaminophen (TYLENOL) 500 MG tablet Take 1 tablet (500 mg total) by mouth every 6 (six) hours as needed for pain.  60 tablet  0  . ALPRAZolam (XANAX) 0.25 MG tablet Take 1 tablet (0.25 mg total) by mouth 2 (two) times daily as needed for sleep or anxiety. Or chest pain  20 tablet  0  . amLODipine (NORVASC) 10 MG tablet TAKE 1 TABLET BY MOUTH ONCE DAILY  90 tablet  0  . cyclobenzaprine (FLEXERIL) 10 MG tablet Take 1 tablet (10 mg total) by mouth at bedtime as needed for muscle spasms.  30 tablet  0  . lisinopril-hydrochlorothiazide (PRINZIDE,ZESTORETIC) 20-12.5 MG per tablet Take 1 tablet by mouth daily.  90 tablet  1  . ranitidine (ZANTAC) 300 MG tablet Take 1 tablet (300 mg total) by mouth at bedtime.  30 tablet  2  . simvastatin (ZOCOR) 20 MG tablet Take 40 mg by mouth at bedtime.      [name]  sucralfate (CARAFATE) 1 G tablet Take 1 tablet (1 g total) by mouth 4 (four) times daily.  80 tablet  0   No current facility-administered medications on file prior to visit.    No Known Allergies  Review of Systems  Review of Systems  Constitutional: Negative for fever and malaise/fatigue.  HENT: Negative for congestion.   Eyes: Negative for discharge.  Respiratory: Negative for shortness of breath.   Cardiovascular: Negative for chest pain, palpitations and leg swelling.  Gastrointestinal: Positive for heartburn and abdominal pain. Negative for nausea and diarrhea.  Genitourinary: Negative for dysuria.  Musculoskeletal: Negative for falls.  Skin: Negative for rash.  Neurological: Negative for loss of consciousness and headaches.  Endo/Heme/Allergies: Negative for polydipsia.  Psychiatric/Behavioral: Negative for depression and suicidal ideas. The patient is not nervous/anxious and does not have insomnia.     Objective  BP 116/90  Pulse 75  Temp(Src) 98.1 F (36.7 C) (Oral)  Ht 6' (1.829 m)  Wt 249 lb 1.3 oz (112.982  kg)  BMI 33.77 kg/m2  SpO2 97%  Physical Exam  Physical Exam  Constitutional: He is oriented to person, place, and time and well-developed, well-nourished, and in no distress. No distress.  HENT:  Head: Normocephalic and atraumatic.  Eyes: Conjunctivae are normal.  Neck: Neck supple. No thyromegaly present.  Cardiovascular: Normal rate, regular rhythm and normal heart sounds.   No murmur heard. Pulmonary/Chest: Effort normal and breath sounds normal. No respiratory distress.  Abdominal: He exhibits no distension and no mass. There is no tenderness.  Musculoskeletal: He exhibits no edema.  Neurological: He is alert and oriented to person, place, and time.  Skin: Skin is warm.  Psychiatric: Memory, affect and judgment normal.    Lab Results  Component Value Date   TSH 1.641 02/28/2012   Lab Results  Component Value Date   WBC 5.6 06/26/2012   HGB 16.1 06/26/2012   HCT 45.7 06/26/2012   MCV 83.1 06/26/2012   PLT 236 06/26/2012   Lab Results  Component Value Date   CREATININE 0.93 06/26/2012   BUN 12 06/26/2012   NA 138 06/26/2012   K 3.6 06/26/2012   CL 101 06/26/2012   CO2 25 06/26/2012   Lab Results  Component Value Date   ALT 34 06/26/2012   AST 26 06/26/2012   ALKPHOS 70 06/26/2012   BILITOT 0.3 06/26/2012   Lab Results  Component Value Date   CHOL 242* 02/28/2012   Lab Results  Component Value Date   HDL 45 02/28/2012   Lab Results  Component Value Date   LDLCALC 177* 02/28/2012   Lab Results  Component Value Date   TRIG 102 02/28/2012   Lab Results  Component Value Date   CHOLHDL 5.4 02/28/2012     Assessment & Plan  HYPERTENSION, UNSPECIFIED Good control, no changes.  OBESITY Discussed at length need for diet changes and weight loss, encouraged DASH diet and increased exercise.   HYPERLIPIDEMIA-MIXED Tolerating Simvastatin encouraged to avoid trans fats, increase exercise recheck lipid panel prior to next visit.  Erectile  dysfunction Given rx for Cialis 5 mg daily at his request  OSA (obstructive sleep apnea) Using CPAP machine routinely  Esophageal reflux Avoid offending foods and continue Ranitidine, inadequate control so will try to change from nexium to Zegerid

## 2013-01-15 NOTE — Assessment & Plan Note (Signed)
Discussed at length need for diet changes and weight loss, encouraged DASH diet and increased exercise.

## 2013-01-15 NOTE — Assessment & Plan Note (Signed)
Good control, no changes. 

## 2013-01-15 NOTE — Assessment & Plan Note (Signed)
Given rx for Cialis 5 mg daily at his request

## 2013-01-15 NOTE — Assessment & Plan Note (Signed)
Tolerating Simvastatin encouraged to avoid trans fats, increase exercise recheck lipid panel prior to next visit.

## 2013-01-15 NOTE — Assessment & Plan Note (Signed)
Avoid offending foods and continue Ranitidine, inadequate control so will try to change from nexium to Zegerid

## 2013-01-15 NOTE — Assessment & Plan Note (Signed)
Using CPAP machine routinely

## 2013-02-04 ENCOUNTER — Encounter (HOSPITAL_COMMUNITY): Payer: Self-pay

## 2013-02-04 ENCOUNTER — Ambulatory Visit (HOSPITAL_COMMUNITY)
Admission: RE | Admit: 2013-02-04 | Discharge: 2013-02-04 | Disposition: A | Discharge status: Home / Self Care | Payer: 59 | Source: Ambulatory Visit | Attending: Internal Medicine | Admitting: Internal Medicine

## 2013-02-04 VITALS — BP 130/92 | HR 82 | Wt 245.8 lb

## 2013-02-04 DIAGNOSIS — I10 Essential (primary) hypertension: Secondary | ICD-10-CM | POA: Insufficient documentation

## 2013-02-04 DIAGNOSIS — R0789 Other chest pain: Secondary | ICD-10-CM

## 2013-02-04 DIAGNOSIS — E782 Mixed hyperlipidemia: Secondary | ICD-10-CM | POA: Insufficient documentation

## 2013-02-04 DIAGNOSIS — R079 Chest pain, unspecified: Secondary | ICD-10-CM | POA: Insufficient documentation

## 2013-02-04 DIAGNOSIS — E785 Hyperlipidemia, unspecified: Secondary | ICD-10-CM

## 2013-02-04 MED ORDER — ATORVASTATIN CALCIUM 20 MG PO TABS: 20.0000 mg | ORAL_TABLET | Freq: Every day | ORAL | Status: DC

## 2013-02-04 NOTE — Assessment & Plan Note (Signed)
Due for recheck. Will check today. Given markedly elevated LDL will try to start atorva 20 to see if he can tolerate. F/u PCP.

## 2013-02-04 NOTE — Progress Notes (Signed)
Patient ID: [name] Foster, male   DOB: 29-Sep-1965, 47 y.o.   MRN: [id number]  PCP: [name] Courts, MD  HPI:  [name] Foster is 47 y/o male (husband of [name] Foster in our office) with HTN, HL, OSA s/p UPPP here for f/u on his CP. Had EGD 10 years and found to have ulcer.   2011 ECHO EF 60% grade 2 diastolic dysfunction. Also had normal ETT.  11/13: Cardiac CT. Normal cors. Ca++ score = 0.   He returns for follow up. Continues to have CP several times per day. Usually lasts for 5-6 hours.  Has tried muscle relaxants and benzos. Says they take the pain away but make him very fatigued. Also takes Zegredid. Often comes on as heartburn and says it just throbs. Feels it is coming up and going into lungs. + acid brash. Can come on at any time but worse at night not related to exercise. No ab pain. No melena or BRBPR. BP has been relatively well controlled. Wife tells him he is snoring less but some nights it is worse.   Had EGD and RUQ u/s in 12/13. Both normal except for fatty liver.   Had has LDL > 170 with high hs-CRP. Simvastatin doubled by PCP. Not taking b/c he is afraid of memory loss.    ROS: All systems negative except as listed in HPI, PMH and Problem List.  Past Medical History  Diagnosis Date  . Mixed hyperlipidemia   . Hypertension   . Bone spur     left foot  . OSA (obstructive sleep apnea)     s/p UPPP  . Murmur   . Acid reflux   . Stomach ulcer     from PCP  . Erectile dysfunction 01/15/2013  . Esophageal reflux 01/15/2013    Current Outpatient Prescriptions  Medication Sig Dispense Refill  . acetaminophen (TYLENOL) 500 MG tablet Take 1 tablet (500 mg total) by mouth every 6 (six) hours as needed for pain.  60 tablet  0  . ALPRAZolam (XANAX) 0.25 MG tablet Take 1 tablet (0.25 mg total) by mouth 2 (two) times daily as needed for sleep or anxiety. Or chest pain  20 tablet  0  . amLODipine (NORVASC) 10 MG tablet TAKE 1 TABLET BY MOUTH ONCE DAILY  90 tablet  0  . cyclobenzaprine  (FLEXERIL) 10 MG tablet Take 1 tablet (10 mg total) by mouth at bedtime as needed for muscle spasms.  30 tablet  0  . hyoscyamine (LEVSIN SL) 0.125 MG SL tablet Place 1 tablet (0.125 mg total) under the tongue every 4 (four) hours as needed for cramping.  30 tablet  1  . lisinopril-hydrochlorothiazide (PRINZIDE,ZESTORETIC) 20-12.5 MG per tablet Take 1 tablet by mouth daily.  90 tablet  1  . omeprazole-sodium bicarbonate (ZEGERID) 40-1100 MG per capsule Take 1 capsule by mouth daily before breakfast.  30 capsule  5  . ranitidine (ZANTAC) 300 MG tablet Take 1 tablet (300 mg total) by mouth at bedtime.  30 tablet  2  . simvastatin (ZOCOR) 20 MG tablet Take 40 mg by mouth at bedtime.      . sucralfate (CARAFATE) 1 G tablet Take 1 tablet (1 g total) by mouth 4 (four) times daily.  80 tablet  0  . tadalafil (CIALIS) 5 MG tablet Take 1 tablet (5 mg total) by mouth daily as needed for erectile dysfunction.  30 tablet  5   No current facility-administered medications for this encounter.     PHYSICAL  EXAM: Filed Vitals:   02/04/13 1122  BP: 130/92  Pulse: 82   General:  Well appearing. No resp difficulty HEENT: normal Neck: supple. JVP flat. Carotids 2+ bilaterally; no bruits. No lymphadenopathy or thryomegaly appreciated. Cor: PMI normal. Regular rate & rhythm. No rubs, gallops or murmur Lungs: clear Abdomen: soft, obese nontender, nondistended.  No bruits or masses. Good bowel sounds. Extremities: no cyanosis, clubbing, rash, edema Neuro: alert & orientedx3, cranial nerves grossly intact. Moves all 4 extremities w/o difficulty. Affect pleasant.    ECG: NSR No ST-T wave abnormalities.     ASSESSMENT & PLAN:

## 2013-02-04 NOTE — Addendum Note (Signed)
Encounter addended by: Noralee Space, RN on: 02/04/2013 11:50 AM<BR>     Documentation filed: Patient Instructions Section, Orders

## 2013-02-04 NOTE — Addendum Note (Signed)
Encounter addended by: Noralee Space, RN on: 02/04/2013 11:54 AM<BR>     Documentation filed: Orders

## 2013-02-04 NOTE — Assessment & Plan Note (Signed)
SBP is well controlled. DBP still mildly elevated. Continue current meds. Stressed need for weight loss and more exercise.

## 2013-02-04 NOTE — Patient Instructions (Addendum)
Start Atorvastatin 20 mg daily  Follow up with Dr Jarold Motto  We will contact you in 1 year to schedule your next appointment.

## 2013-02-04 NOTE — Assessment & Plan Note (Signed)
Given pattern of CP and normal coronaries on cardiac CT this is not cardiac in nature. Will have him f/u with GI to get further thoughts on control of his reflux symptoms.

## 2013-02-11 ENCOUNTER — Ambulatory Visit: Payer: 59 | Admitting: Gastroenterology

## 2013-02-11 ENCOUNTER — Telehealth: Payer: Self-pay | Admitting: Gastroenterology

## 2013-02-11 NOTE — Telephone Encounter (Signed)
Please charge  

## 2013-03-25 NOTE — Telephone Encounter (Signed)
This is a [employer] that truly was not able to leave work for an appoinment

## 2013-04-29 ENCOUNTER — Other Ambulatory Visit: Payer: Self-pay | Admitting: Family Medicine

## 2013-04-29 ENCOUNTER — Other Ambulatory Visit: Payer: Self-pay | Admitting: Internal Medicine

## 2013-04-29 ENCOUNTER — Other Ambulatory Visit: Payer: Self-pay | Admitting: Gastroenterology

## 2013-04-30 ENCOUNTER — Other Ambulatory Visit: Payer: Self-pay | Admitting: Family Medicine

## 2013-04-30 NOTE — Telephone Encounter (Signed)
Rx request to pharmacy; *PATIENT DUE FOR FOLLOW-UP OFFICE VISIT*/SLS    

## 2013-05-08 ENCOUNTER — Other Ambulatory Visit: Payer: Self-pay | Admitting: Family Medicine

## 2013-05-08 NOTE — Telephone Encounter (Signed)
Please advise refill? Last RX was done on 10-14-12 quantity 20 with 0 refills.  If ok fax to [phone]

## 2013-07-08 ENCOUNTER — Ambulatory Visit (INDEPENDENT_AMBULATORY_CARE_PROVIDER_SITE_OTHER): Payer: 59 | Admitting: Family Medicine

## 2013-07-08 ENCOUNTER — Telehealth: Payer: Self-pay | Admitting: Family Medicine

## 2013-07-08 ENCOUNTER — Encounter: Payer: Self-pay | Admitting: Family Medicine

## 2013-07-08 VITALS — BP 144/92 | HR 79 | Temp 98.1°F | Ht 72.0 in | Wt 251.0 lb

## 2013-07-08 DIAGNOSIS — IMO0001 Reserved for inherently not codable concepts without codable children: Secondary | ICD-10-CM

## 2013-07-08 DIAGNOSIS — K449 Diaphragmatic hernia without obstruction or gangrene: Secondary | ICD-10-CM

## 2013-07-08 DIAGNOSIS — K219 Gastro-esophageal reflux disease without esophagitis: Secondary | ICD-10-CM

## 2013-07-08 DIAGNOSIS — F411 Generalized anxiety disorder: Secondary | ICD-10-CM

## 2013-07-08 DIAGNOSIS — R7309 Other abnormal glucose: Secondary | ICD-10-CM

## 2013-07-08 DIAGNOSIS — I10 Essential (primary) hypertension: Secondary | ICD-10-CM

## 2013-07-08 DIAGNOSIS — R109 Unspecified abdominal pain: Secondary | ICD-10-CM

## 2013-07-08 DIAGNOSIS — R739 Hyperglycemia, unspecified: Secondary | ICD-10-CM

## 2013-07-08 DIAGNOSIS — E785 Hyperlipidemia, unspecified: Secondary | ICD-10-CM

## 2013-07-08 MED ORDER — RANITIDINE HCL 300 MG PO TABS: 300.0000 mg | ORAL_TABLET | Freq: Every day | ORAL | Status: DC

## 2013-07-08 MED ORDER — NEXIUM 40 MG PO CPDR: 40.0000 mg | DELAYED_RELEASE_CAPSULE | Freq: Two times a day (BID) | ORAL | Status: DC

## 2013-07-08 MED ORDER — ALPRAZOLAM 0.25 MG PO TABS: 0.2500 mg | ORAL_TABLET | Freq: Two times a day (BID) | ORAL | Status: DC | PRN

## 2013-07-08 MED ORDER — SUCRALFATE 1 GM/10ML PO SUSP: 1.0000 g | Freq: Four times a day (QID) | ORAL | Status: DC

## 2013-07-08 NOTE — Telephone Encounter (Signed)
Patient Information:  Caller Name: [name] Foster  Phone: [phone]  Patient: Foster, [name]  Gender: Male  DOB: [birth year]  Age: 47 Years  PCP: Danise Edge [facility])  Office Follow Up:  Does the office need to follow up with this patient?: No  Instructions For The Office: N/A   Symptoms  Reason For Call & Symptoms: Chest pain onset estimated 06/24/13.  Intermittent aching that lasts up to a few hours. Pain rated up to  8 of 10; currenly 4 of 10.  Emergent symptoms ruled out.  See Today in Office per Chest Pain protocol due to INtermittent chest pains persist > 3 days.  He also voiced concerns about bilateral lower back pain and that he may have something going on with his kidneys.  Reviewed Health History In EMR: Yes  Reviewed Medications In EMR: Yes  Reviewed Allergies In EMR: Yes  Reviewed Surgeries / Procedures: Yes  Date of Onset of Symptoms: 06/24/2013  Treatments Tried: Ibuprofen 600 mg, Tylenol prn  Treatments Tried Worked: Yes  Guideline(s) Used:  Chest Pain  Disposition Per Guideline:   See Today in Office  Reason For Disposition Reached:   Intermittent chest pains persist > 3 days  Advice Given:  N/A  Patient Will Follow Care Advice:  YES  Appointment Scheduled:  07/08/2013 16:30:00 Appointment Scheduled Provider:  Danise Edge [facility])

## 2013-07-08 NOTE — Patient Instructions (Signed)
Gastroesophageal Reflux Disease, Adult  Gastroesophageal reflux disease (GERD) happens when acid from your stomach flows up into the esophagus. When acid comes in contact with the esophagus, the acid causes soreness (inflammation) in the esophagus. Over time, GERD may create small holes (ulcers) in the lining of the esophagus.  CAUSES   · Increased body weight. This puts pressure on the stomach, making acid rise from the stomach into the esophagus.  · Smoking. This increases acid production in the stomach.  · Drinking alcohol. This causes decreased pressure in the lower esophageal sphincter (valve or ring of muscle between the esophagus and stomach), allowing acid from the stomach into the esophagus.  · Late evening meals and a full stomach. This increases pressure and acid production in the stomach.  · A malformed lower esophageal sphincter.  Sometimes, no cause is found.  SYMPTOMS   · Burning pain in the lower part of the mid-chest behind the breastbone and in the mid-stomach area. This may occur twice a week or more often.  · Trouble swallowing.  · Sore throat.  · Dry cough.  · Asthma-like symptoms including chest tightness, shortness of breath, or wheezing.  DIAGNOSIS   Your caregiver may be able to diagnose GERD based on your symptoms. In some cases, X-rays and other tests may be done to check for complications or to check the condition of your stomach and esophagus.  TREATMENT   Your caregiver may recommend over-the-counter or prescription medicines to help decrease acid production. Ask your caregiver before starting or adding any new medicines.   HOME CARE INSTRUCTIONS   · Change the factors that you can control. Ask your caregiver for guidance concerning weight loss, quitting smoking, and alcohol consumption.  · Avoid foods and drinks that make your symptoms worse, such as:  · Caffeine or alcoholic drinks.  · Chocolate.  · Peppermint or mint flavorings.  · Garlic and onions.  · Spicy foods.  · Citrus fruits,  such as oranges, lemons, or limes.  · Tomato-based foods such as sauce, chili, salsa, and pizza.  · Fried and fatty foods.  · Avoid lying down for the 3 hours prior to your bedtime or prior to taking a nap.  · Eat small, frequent meals instead of large meals.  · Wear loose-fitting clothing. Do not wear anything tight around your waist that causes pressure on your stomach.  · Raise the head of your bed 6 to 8 inches with wood blocks to help you sleep. Extra pillows will not help.  · Only take over-the-counter or prescription medicines for pain, discomfort, or fever as directed by your caregiver.  · Do not take aspirin, ibuprofen, or other nonsteroidal anti-inflammatory drugs (NSAIDs).  SEEK IMMEDIATE MEDICAL CARE IF:   · You have pain in your arms, neck, jaw, teeth, or back.  · Your pain increases or changes in intensity or duration.  · You develop nausea, vomiting, or sweating (diaphoresis).  · You develop shortness of breath, or you faint.  · Your vomit is green, yellow, black, or looks like coffee grounds or blood.  · Your stool is red, bloody, or black.  These symptoms could be signs of other problems, such as heart disease, gastric bleeding, or esophageal bleeding.  MAKE SURE YOU:   · Understand these instructions.  · Will watch your condition.  · Will get help right away if you are not doing well or get worse.  Document Released: 06/07/2005 Document Revised: 11/20/2011 Document Reviewed: 03/17/2011  [employer]® Patient   Information ©2014 [employer].

## 2013-07-08 NOTE — Telephone Encounter (Signed)
Seen in office.

## 2013-07-10 ENCOUNTER — Telehealth: Payer: Self-pay | Admitting: Family Medicine

## 2013-07-10 LAB — LIPID PANEL
HDL: 39 mg/dL — ABNORMAL LOW (ref >39)
LDL Cholesterol: 153 mg/dL — ABNORMAL HIGH (ref 0–99)
Total CHOL/HDL Ratio: 5.7 Ratio
Triglycerides: 155 mg/dL — ABNORMAL HIGH (ref <150)
VLDL: 31 mg/dL (ref 0–40)

## 2013-07-10 LAB — HEPATIC FUNCTION PANEL
AST: 25 U/L (ref 0–37)
Albumin: 4.4 g/dL (ref 3.5–5.2)
Alkaline Phosphatase: 60 U/L (ref 39–117)
Indirect Bilirubin: 0.4 mg/dL (ref 0.0–0.9)
Total Protein: 7.3 g/dL (ref 6.0–8.3)

## 2013-07-10 LAB — RENAL FUNCTION PANEL
Albumin: 4.4 g/dL (ref 3.5–5.2)
BUN: 13 mg/dL (ref 6–23)
Chloride: 104 mEq/L (ref 96–112)
Creat: 1.02 mg/dL (ref 0.50–1.35)
Phosphorus: 3.5 mg/dL (ref 2.3–4.6)

## 2013-07-10 LAB — LIPASE: Lipase: 12 U/L (ref 0–75)

## 2013-07-10 LAB — H. PYLORI ANTIBODY, IGG: H Pylori IgG: 0.83 {ISR}

## 2013-07-10 LAB — TSH: TSH: 1.761 u[IU]/mL (ref 0.350–4.500)

## 2013-07-10 NOTE — Telephone Encounter (Signed)
He was just seen so he can have a note covering 10/28 and 10/29 from work

## 2013-07-10 NOTE — Telephone Encounter (Signed)
Patient is requesting a work note for dates 07/08/13 and 07/09/13. He says that he will be going back to work today.

## 2013-07-10 NOTE — Telephone Encounter (Signed)
Please advise 

## 2013-07-11 ENCOUNTER — Telehealth: Payer: Self-pay

## 2013-07-11 NOTE — Telephone Encounter (Signed)
Message copied by Eulis Manly on Fri Jul 11, 2013  4:10 PM ------      Message from: Danise Edge A      Created: Thu Jul 10, 2013  9:50 PM       Notify labs pretty good, cholesterol better but still too high, recommend he increase his Lipitor to 40 mg daily and we will check it again in 3 months. Disp #90 ------

## 2013-07-11 NOTE — Telephone Encounter (Signed)
Left Patient a voice mail concerning lab results. Instructed him to call back if he had any questions or concerns.

## 2013-07-13 ENCOUNTER — Encounter: Payer: Self-pay | Admitting: Family Medicine

## 2013-07-13 DIAGNOSIS — E669 Obesity, unspecified: Secondary | ICD-10-CM | POA: Insufficient documentation

## 2013-07-13 DIAGNOSIS — E1169 Type 2 diabetes mellitus with other specified complication: Secondary | ICD-10-CM

## 2013-07-13 DIAGNOSIS — R739 Hyperglycemia, unspecified: Secondary | ICD-10-CM

## 2013-07-13 DIAGNOSIS — F411 Generalized anxiety disorder: Secondary | ICD-10-CM | POA: Insufficient documentation

## 2013-07-13 HISTORY — DX: Type 2 diabetes mellitus with other specified complication: E11.69

## 2013-07-13 HISTORY — DX: Type 2 diabetes mellitus with other specified complication: E66.9

## 2013-07-13 HISTORY — DX: Hyperglycemia, unspecified: R73.9

## 2013-07-13 NOTE — Assessment & Plan Note (Signed)
Tolerating Atorvastatin, avoid trans fats. Increase exercise.

## 2013-07-13 NOTE — Assessment & Plan Note (Signed)
Mild elevation with acute pain, no changes 

## 2013-07-13 NOTE — Progress Notes (Signed)
Patient ID: [name] Foster, male   DOB: 03/14/1966, 47 y.o.   MRN: [id number] [name] Foster [id number] 31-Aug-1966 07/13/2013      Progress Note-Follow Up  Subjective  Chief Complaint  Chief Complaint  Patient presents with  . Chest Pain    intermittent since 06-24-13- throbbing- aching pain center of chest radiates to back, pain in R. shoulder- pt states the omeprazole doesn't work as well as the Nexium did    HPI  Patient is a 47 year old African American male who is in today to discuss reflux and chest pain. He was doing better on Nexium but he reports on omeprazole he has frequent dyspepsia. He has run out of Carafate he also found that helpful. 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 Has some strain in his right shoulder in particular. Symptoms aren't different times and reflux. No shortness of breath or diaphoresis. No recent fevers or chills. No vomiting, constipation or diarrhea.  Past Medical History  Diagnosis Date  . Mixed hyperlipidemia   . Hypertension   . Bone spur     left foot  . OSA (obstructive sleep apnea)     s/p UPPP  . Murmur   . Acid reflux   . Stomach ulcer     from PCP  . Erectile dysfunction 01/15/2013  . Esophageal reflux 01/15/2013  . Hyperglycemia 07/13/2013    Past Surgical History  Procedure Laterality Date  . Tonsillectomy    . Uvulectomy      Family History  Problem Relation Age of Onset  . Dementia Mother   . Diabetes Mother   . Hypertension Mother   . Heart failure Mother   . Hypertension      siblings  . Colon cancer Neg Hx     History   Social History  . Marital Status: Married    Spouse Name: N/A    Number of Children: N/A  . Years of Education: N/A   Occupational History  . Engineer, mining    Social History Main Topics  . Smoking status: Never Smoker   . Smokeless tobacco: Never Used  . Alcohol Use: No  . Drug Use: No  . Sexual Activity: Yes   Other Topics Concern  .  Not on file   Social History Narrative   Tobacco Use - No.    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 acetaminophen (TYLENOL) 500 MG tablet Take 1 tablet (500 mg total) by mouth every 6 (six) hours as needed for pain.  60 tablet  0  . amLODipine (NORVASC) 10 MG tablet TAKE 1 TABLET BY MOUTH ONCE DAILY  90 tablet  0  . atorvastatin (LIPITOR) 20 MG tablet Take 1 tablet (20 mg total) by mouth daily.  90 tablet  3  . cyclobenzaprine (FLEXERIL) 10 MG tablet Take 1 tablet (10 mg total) by mouth at bedtime as needed for muscle spasms.  30 tablet  0  .  hyoscyamine (LEVSIN SL) 0.125 MG SL tablet Place 1 tablet (0.125 mg total) under the tongue every 4 (four) hours as needed for cramping.  30 tablet  1  . lisinopril-hydrochlorothiazide (PRINZIDE,ZESTORETIC) 20-12.5 MG per tablet Take 1 tablet by mouth daily. PATIENT DUE FOR FOLLOW-UP OFFICE VISIT  90 tablet  0  . tadalafil (CIALIS) 5 MG tablet Take 1 tablet (5 mg total) by mouth daily as needed for erectile dysfunction.  30 tablet  5   No current facility-administered medications on file prior to visit.    No Known Allergies  Review of Systems  Review of Systems  Constitutional: Negative for fever and malaise/fatigue.  HENT: Negative for congestion.   Eyes: Negative for discharge.  Respiratory: Negative for shortness of breath.   Cardiovascular: Positive for chest pain. Negative for palpitations and leg swelling.  Gastrointestinal: Positive for heartburn and nausea. Negative for abdominal pain and diarrhea.  Genitourinary: Negative for dysuria.  Musculoskeletal: Positive for joint pain.  Negative for falls.       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  Neurological: Negative for loss of consciousness and headaches.  Endo/Heme/Allergies: Negative for polydipsia.  Psychiatric/Behavioral: Negative for depression and suicidal ideas. The patient is nervous/anxious. The patient does not have insomnia.     Objective  BP 144/92  Pulse 79  Temp(Src) 98.1 F (36.7 C) (Oral)  Ht 6' (1.829 m)  Wt 251 lb (113.853 kg)  BMI 34.03 kg/m2  SpO2 97%  Physical Exam  Physical Exam  Constitutional: He is oriented to person, place, and time and well-developed, well-nourished, and in no distress. No distress.  HENT:  Head: Normocephalic and atraumatic.  Eyes: Conjunctivae are normal.  Neck: Neck supple. No thyromegaly present.  Cardiovascular: Normal rate, regular rhythm and normal heart sounds.   No murmur heard. Pulmonary/Chest: Effort normal and breath sounds normal. No respiratory distress.  Abdominal: He exhibits no distension and no mass. There is no tenderness.  Musculoskeletal: He exhibits no edema.  Neurological: He is alert and oriented to person, place, and time.  Skin: Skin is warm.  Psychiatric: Memory, affect and judgment normal.    Lab Results  Component Value Date   TSH 1.761 07/08/2013   Lab Results  Component Value Date   WBC 5.6 06/26/2012   HGB 16.1 06/26/2012   HCT 45.7 06/26/2012   MCV 83.1 06/26/2012   PLT 236 06/26/2012   Lab Results  Component Value Date   CREATININE 1.02 07/08/2013   BUN 13 07/08/2013   NA 140 07/08/2013   K 4.1 07/08/2013   CL 104 07/08/2013   CO2 27 07/08/2013   Lab Results  Component Value Date   ALT 36 07/08/2013   AST 25 07/08/2013   ALKPHOS 60 07/08/2013   BILITOT 0.5 07/08/2013   Lab Results  Component Value Date   CHOL 223* 07/08/2013   Lab Results  Component Value Date   HDL 39* 07/08/2013   Lab Results  Component Value Date   LDLCALC 153*  07/08/2013   Lab Results  Component Value Date   TRIG 155* 07/08/2013   Lab Results  Component Value Date   CHOLHDL 5.7 07/08/2013     Assessment & Plan  HYPERTENSION, UNSPECIFIED Mild elevation with acute pain, no changes.   Esophageal reflux Not responding well to Omeprazole, will restart Nexium, restart Carafate, avoid offending foods, start probiotics.   HYPERLIPIDEMIA-MIXED Tolerating Atorvastatin, avoid trans fats. Increase exercise.  Hyperglycemia hgba1c 6.0  minimize simple carbs and increase activity as tolerated.   Anxiety state, unspecified Allowed refill of Alprazolam to use prn

## 2013-07-13 NOTE — Assessment & Plan Note (Signed)
Allowed refill of Alprazolam to use prn

## 2013-07-13 NOTE — Assessment & Plan Note (Signed)
hgba1c 6.0 minimize simple carbs and increase activity as tolerated.

## 2013-07-13 NOTE — Assessment & Plan Note (Addendum)
Not responding well to Omeprazole, will restart Nexium, restart Carafate, avoid offending foods, start probiotics.

## 2013-07-14 NOTE — Telephone Encounter (Signed)
Printed and stephanie gave to pt

## 2013-07-19 ENCOUNTER — Encounter (HOSPITAL_COMMUNITY): Payer: Self-pay | Admitting: Emergency Medicine

## 2013-07-19 ENCOUNTER — Emergency Department (HOSPITAL_COMMUNITY): Payer: Worker's Compensation

## 2013-07-19 ENCOUNTER — Emergency Department (HOSPITAL_COMMUNITY)
Admission: EM | Admit: 2013-07-19 | Discharge: 2013-07-20 | Disposition: A | Discharge status: Home / Self Care | Payer: Worker's Compensation | Attending: Emergency Medicine | Admitting: Emergency Medicine

## 2013-07-19 DIAGNOSIS — Z87448 Personal history of other diseases of urinary system: Secondary | ICD-10-CM | POA: Insufficient documentation

## 2013-07-19 DIAGNOSIS — Y9389 Activity, other specified: Secondary | ICD-10-CM | POA: Insufficient documentation

## 2013-07-19 DIAGNOSIS — Z8669 Personal history of other diseases of the nervous system and sense organs: Secondary | ICD-10-CM | POA: Insufficient documentation

## 2013-07-19 DIAGNOSIS — Z8711 Personal history of peptic ulcer disease: Secondary | ICD-10-CM | POA: Insufficient documentation

## 2013-07-19 DIAGNOSIS — Z8739 Personal history of other diseases of the musculoskeletal system and connective tissue: Secondary | ICD-10-CM | POA: Insufficient documentation

## 2013-07-19 DIAGNOSIS — I10 Essential (primary) hypertension: Secondary | ICD-10-CM | POA: Insufficient documentation

## 2013-07-19 DIAGNOSIS — S339XXA Sprain of unspecified parts of lumbar spine and pelvis, initial encounter: Secondary | ICD-10-CM | POA: Insufficient documentation

## 2013-07-19 DIAGNOSIS — R011 Cardiac murmur, unspecified: Secondary | ICD-10-CM | POA: Insufficient documentation

## 2013-07-19 DIAGNOSIS — S39012A Strain of muscle, fascia and tendon of lower back, initial encounter: Secondary | ICD-10-CM

## 2013-07-19 DIAGNOSIS — K219 Gastro-esophageal reflux disease without esophagitis: Secondary | ICD-10-CM | POA: Insufficient documentation

## 2013-07-19 DIAGNOSIS — E782 Mixed hyperlipidemia: Secondary | ICD-10-CM | POA: Insufficient documentation

## 2013-07-19 DIAGNOSIS — Y92410 Unspecified street and highway as the place of occurrence of the external cause: Secondary | ICD-10-CM | POA: Insufficient documentation

## 2013-07-19 DIAGNOSIS — Z79899 Other long term (current) drug therapy: Secondary | ICD-10-CM | POA: Insufficient documentation

## 2013-07-19 MED ORDER — HYDROCODONE-ACETAMINOPHEN 5-325 MG PO TABS
1.0000 | ORAL_TABLET | Freq: Once | ORAL | Status: AC
  Administered 2013-07-19: 1 via ORAL
  Filled 2013-07-19: qty 1

## 2013-07-19 MED ORDER — CYCLOBENZAPRINE HCL 10 MG PO TABS
5.0000 mg | ORAL_TABLET | Freq: Once | ORAL | Status: AC
  Administered 2013-07-19: 5 mg via ORAL
  Filled 2013-07-19: qty 1
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
## 2013-07-20 MED ORDER — OXYCODONE-ACETAMINOPHEN 5-325 MG PO TABS: 1.0000 | ORAL_TABLET | Freq: Four times a day (QID) | ORAL | Status: DC | PRN

## 2013-07-20 MED ORDER — OXYCODONE-ACETAMINOPHEN 5-325 MG PO TABS
2.0000 | ORAL_TABLET | Freq: Once | ORAL | Status: AC
  Administered 2013-07-20: 2 via ORAL
  Filled 2013-07-20: qty 2

## 2013-07-20 MED ORDER — CYCLOBENZAPRINE HCL 10 MG PO TABS: 10.0000 mg | ORAL_TABLET | Freq: Three times a day (TID) | ORAL | Status: DC | PRN

## 2013-07-21 NOTE — ED Provider Notes (Signed)
Medical screening examination/treatment/procedure(s) were performed by non-physician practitioner and as supervising physician I was immediately available for consultation/collaboration.  EKG Interpretation   None         Darlys Gales, MD 07/21/13 [phone]

## 2013-08-06 ENCOUNTER — Telehealth: Payer: Self-pay | Admitting: *Deleted

## 2013-08-06 NOTE — Telephone Encounter (Signed)
Patient requesting refill for Ibuprofen 600 mg; EMR shows that pt previously took Ibuprofen 800 mg, but last Rx was 10.13.2013 and this medication was D/C on 03.08.14. This medication was also not listed by patient and/or provider as taking on last OV on 10.28.14. LMOM with contact name and number [for return call, if needed] RE: non-active Rx and to inform that provider out of office until Monday, 12.01.14; and information will be addressed at that time/SLS

## 2013-08-08 NOTE — Telephone Encounter (Signed)
Patient wife Merita Norton called regarding this. Best # [phone]

## 2013-08-09 NOTE — Telephone Encounter (Signed)
OK to rx Ibuprofen 600 mg po tid prn pain with food. Disp #90 with 1 rf

## 2013-08-11 MED ORDER — IBUPROFEN 600 MG PO TABS: 600.0000 mg | ORAL_TABLET | Freq: Three times a day (TID) | ORAL | Status: DC | PRN

## 2013-08-12 ENCOUNTER — Ambulatory Visit (INDEPENDENT_AMBULATORY_CARE_PROVIDER_SITE_OTHER): Payer: 59 | Admitting: Family Medicine

## 2013-08-12 ENCOUNTER — Encounter: Payer: Self-pay | Admitting: Family Medicine

## 2013-08-12 VITALS — BP 130/90 | HR 69 | Temp 98.0°F | Ht 72.0 in | Wt 249.1 lb

## 2013-08-12 DIAGNOSIS — K219 Gastro-esophageal reflux disease without esophagitis: Secondary | ICD-10-CM

## 2013-08-12 DIAGNOSIS — I10 Essential (primary) hypertension: Secondary | ICD-10-CM

## 2013-08-12 DIAGNOSIS — M549 Dorsalgia, unspecified: Secondary | ICD-10-CM

## 2013-08-12 DIAGNOSIS — R110 Nausea: Secondary | ICD-10-CM

## 2013-08-12 MED ORDER — PROMETHAZINE HCL 25 MG PO TABS: 25.0000 mg | ORAL_TABLET | Freq: Three times a day (TID) | ORAL | Status: DC | PRN
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
## 2013-08-12 NOTE — Patient Instructions (Addendum)
Salon Pas Patches, or cream or Icy Hot or Aspercreme. Tylenol 500 mg 2 tabs up to 3 x days as needed for pain Probiotic daily a generic is fine or Digestive Advantage  Gastroesophageal Reflux Disease, Adult Gastroesophageal reflux disease (GERD) happens when acid from your stomach flows up into the esophagus. When acid comes in contact with the esophagus, the acid causes soreness (inflammation) in the esophagus. Over time, GERD may create small holes (ulcers) in the lining of the esophagus. CAUSES   Increased body weight. This puts pressure on the stomach, making acid rise from the stomach into the esophagus.  Smoking. This increases acid production in the stomach.  Drinking alcohol. This causes decreased pressure in the lower esophageal sphincter (valve or ring of muscle between the esophagus and stomach), allowing acid from the stomach into the esophagus.  Late evening meals and a full stomach. This increases pressure and acid production in the stomach.  A malformed lower esophageal sphincter. Sometimes, no cause is found. SYMPTOMS   Burning pain in the lower part of the mid-chest behind the breastbone and in the mid-stomach area. This may occur twice a week or more often.  Trouble swallowing.  Sore throat.  Dry cough.  Asthma-like symptoms including chest tightness, shortness of breath, or wheezing. DIAGNOSIS  Your caregiver may be able to diagnose GERD based on your symptoms. In some cases, X-rays and other tests may be done to check for complications or to check the condition of your stomach and esophagus. TREATMENT  Your caregiver may recommend over-the-counter or prescription medicines to help decrease acid production. Ask your caregiver before starting or adding any new medicines.  HOME CARE INSTRUCTIONS   Change the factors that you can control. Ask your caregiver for guidance concerning weight loss, quitting smoking, and alcohol consumption.  Avoid foods and drinks that  make your symptoms worse, such as:  Caffeine or alcoholic drinks.  Chocolate.  Peppermint or mint flavorings.  Garlic and onions.  Spicy foods.  Citrus fruits, such as oranges, lemons, or limes.  Tomato-based foods such as sauce, chili, salsa, and pizza.  Fried and fatty foods.  Avoid lying down for the 3 hours prior to your bedtime or prior to taking a nap.  Eat small, frequent meals instead of large meals.  Wear loose-fitting clothing. Do not wear anything tight around your waist that causes pressure on your stomach.  Raise the head of your bed 6 to 8 inches with wood blocks to help you sleep. Extra pillows will not help.  Only take over-the-counter or prescription medicines for pain, discomfort, or fever as directed by your caregiver.  Do not take aspirin, ibuprofen, or other nonsteroidal anti-inflammatory drugs (NSAIDs). SEEK IMMEDIATE MEDICAL CARE IF:   You have pain in your arms, neck, jaw, teeth, or back.  Your pain increases or changes in intensity or duration.  You develop nausea, vomiting, or sweating (diaphoresis).  You develop shortness of breath, or you faint.  Your vomit is green, yellow, black, or looks like coffee grounds or blood.  Your stool is red, bloody, or black. These symptoms could be signs of other problems, such as heart disease, gastric bleeding, or esophageal bleeding. MAKE SURE YOU:   Understand these instructions.  Will watch your condition.  Will get help right away if you are not doing well or get worse. Document Released: 06/07/2005 Document Revised: 11/20/2011 Document Reviewed: 03/17/2011 [facility] Patient Information 2014 [place], [place].   DASH Diet The DASH diet stands for "Dietary Approaches  to Stop Hypertension." It is a healthy eating plan that has been shown to reduce high blood pressure (hypertension) in as little as 14 days, while also possibly providing other significant health benefits. These other health benefits  include reducing the risk of breast cancer after menopause and reducing the risk of type 2 diabetes, heart disease, colon cancer, and stroke. Health benefits also include weight loss and slowing kidney failure in patients with chronic kidney disease.  DIET GUIDELINES  Limit salt (sodium). Your diet should contain less than 1500 mg of sodium daily.  Limit refined or processed carbohydrates. Your diet should include mostly whole grains. Desserts and added sugars should be used sparingly.  Include small amounts of heart-healthy fats. These types of fats include nuts, oils, and tub margarine. Limit saturated and trans fats. These fats have been shown to be harmful in the body. CHOOSING FOODS  The following food groups are based on a 2000 calorie diet. See your Registered Dietitian for individual calorie needs. Grains and Grain Products (6 to 8 servings daily)  Eat More Often: Whole-wheat bread, brown rice, whole-grain or wheat pasta, quinoa, popcorn without added fat or salt (air popped).  Eat Less Often: White bread, white pasta, white rice, cornbread. Vegetables (4 to 5 servings daily)  Eat More Often: Fresh, frozen, and canned vegetables. Vegetables may be raw, steamed, roasted, or grilled with a minimal amount of fat.  Eat Less Often/Avoid: Creamed or fried vegetables. Vegetables in a cheese sauce. Fruit (4 to 5 servings daily)  Eat More Often: All fresh, canned (in natural juice), or frozen fruits. Dried fruits without added sugar. One hundred percent fruit juice ( cup [237 mL] daily).  Eat Less Often: Dried fruits with added sugar. Canned fruit in light or heavy syrup. [employer], Fish, and Poultry (2 servings or less daily. One serving is 3 to 4 oz [85-114 g]).  Eat More Often: Ninety percent or leaner ground beef, tenderloin, sirloin. Round cuts of beef, chicken breast, [place] breast. All fish. Grill, bake, or broil your meat. Nothing should be fried.  Eat Less Often/Avoid: Fatty  cuts of meat, [place], or chicken leg, thigh, or wing. Fried cuts of meat or fish. Dairy (2 to 3 servings)  Eat More Often: Low-fat or fat-free milk, low-fat plain or light yogurt, reduced-fat or part-skim cheese.  Eat Less Often/Avoid: Milk (whole, 2%).Whole milk yogurt. Full-fat cheeses. Nuts, Seeds, and Legumes (4 to 5 servings per week)  Eat More Often: All without added salt.  Eat Less Often/Avoid: Salted nuts and seeds, canned beans with added salt. Fats and Sweets (limited)  Eat More Often: Vegetable oils, tub margarines without trans fats, sugar-free gelatin. Mayonnaise and salad dressings.  Eat Less Often/Avoid: Coconut oils, palm oils, butter, stick margarine, cream, half and half, cookies, candy, pie. FOR MORE INFORMATION The Dash Diet Eating Plan: www.dashdiet.org Document Released: 08/17/2011 Document Revised: 11/20/2011 Document Reviewed: 08/17/2011 [facility] Patient Information 2014 [place], [place].

## 2013-08-12 NOTE — Progress Notes (Signed)
Pre visit review using our clinic review tool, if applicable. No additional management support is needed unless otherwise documented below in the visit note. 

## 2013-08-13 ENCOUNTER — Encounter: Payer: Self-pay | Admitting: *Deleted

## 2013-08-13 NOTE — Assessment & Plan Note (Signed)
Add a probiotic and avoid offending foods, may use Phenergan prn
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
## 2013-08-13 NOTE — Assessment & Plan Note (Signed)
adquately controlled no changes

## 2013-08-14 ENCOUNTER — Ambulatory Visit (INDEPENDENT_AMBULATORY_CARE_PROVIDER_SITE_OTHER): Payer: 59 | Admitting: Gastroenterology

## 2013-08-14 ENCOUNTER — Other Ambulatory Visit: Payer: Self-pay | Admitting: *Deleted

## 2013-08-14 ENCOUNTER — Encounter: Payer: Self-pay | Admitting: Gastroenterology

## 2013-08-14 VITALS — BP 118/82 | HR 94 | Ht 71.0 in | Wt 250.1 lb

## 2013-08-14 DIAGNOSIS — R739 Hyperglycemia, unspecified: Secondary | ICD-10-CM

## 2013-08-14 DIAGNOSIS — E785 Hyperlipidemia, unspecified: Secondary | ICD-10-CM

## 2013-08-14 DIAGNOSIS — F411 Generalized anxiety disorder: Secondary | ICD-10-CM

## 2013-08-14 DIAGNOSIS — R7309 Other abnormal glucose: Secondary | ICD-10-CM

## 2013-08-14 DIAGNOSIS — R109 Unspecified abdominal pain: Secondary | ICD-10-CM

## 2013-08-14 DIAGNOSIS — K219 Gastro-esophageal reflux disease without esophagitis: Secondary | ICD-10-CM

## 2013-08-14 DIAGNOSIS — IMO0001 Reserved for inherently not codable concepts without codable children: Secondary | ICD-10-CM

## 2013-08-14 DIAGNOSIS — K449 Diaphragmatic hernia without obstruction or gangrene: Secondary | ICD-10-CM

## 2013-08-14 DIAGNOSIS — I10 Essential (primary) hypertension: Secondary | ICD-10-CM

## 2013-08-14 MED ORDER — METOCLOPRAMIDE HCL 10 MG PO TABS: 10.0000 mg | ORAL_TABLET | Freq: Every day | ORAL | Status: DC

## 2013-08-14 MED ORDER — SUCRALFATE 1 GM/10ML PO SUSP: 1.0000 g | Freq: Four times a day (QID) | ORAL | Status: DC

## 2013-08-14 MED ORDER — NEXIUM 40 MG PO CPDR: 40.0000 mg | DELAYED_RELEASE_CAPSULE | Freq: Two times a day (BID) | ORAL | Status: DC

## 2013-08-14 NOTE — Progress Notes (Signed)
This is a 47 year old black male who I did endoscopy on 2 years ago which showed acid reflux problems.  He is been on Nexium, liquid Carafate, and H2 blockers and continues with severe burning and substernal area both day and nighttime.  He denies dysphagia or any hepatobiliary symptoms.  Previous upper abdominal ultrasound exam did show fatty liver, liver function tests were normal.  He does not smoke or abuse ethanol, and has been staying with his weight.  He has no other cardiovascular or pulmonary symptoms or history of chronic allergies.  These endoscopy showed a 4 cm hiatal hernia and biopsies for H. pylori were negative.  The patient does have sleep apnea and uses a CPAP machine.  Current Medications, Allergies, Past Medical History, Past Surgical History, Family History and Social History were reviewed in [employer] record.  ROS: All systems were reviewed and are negative unless otherwise stated in the HPI.        PE: Blood Pressure 118/82, pulse 94 and weight 250 with a BMI of 34.90.  Cannot appreciate stigmata of chronic liver disease.  Chest is clear percussion endoscopy patient.  He is in a regular rhythm without murmurs gallops or rubs.  There is no organomegaly, abdominal masses or tenderness.  Bowel sounds are normal.  Mental status is normal    Assessment and Plan: Refractory acid reflux perhaps related to his taking his PPI medications inappropriately.  I've asked him to take his Nexium 40 mg 30 minutes before breakfast and then her with an H2 blocker at bedtime, also we will add Reglan 10 mg at bedtime.  I've scheduled followup endoscopy and also esophageal manometry.  He has some liquid Carafate prescribed [place] primary care physician which he continues on a when necessary basis.  He may need fundoplication surgery.  At the time of endoscopy I will do biopsies for eosinophilic esophagitis.  He had a sleep apnea study done, but I cannot see the report.  He does  use a CPAP machine at bedtime.

## 2013-08-14 NOTE — Patient Instructions (Addendum)
You watched a movie today on reflux and information is below.  New prescription for Nexium was sent to your pharmacy please take one capsule by mouth thirty minutes before breakfast and thirty minutes before dinner. Take Carafate before breakfast and before dinner.  We have sent the following medications to your pharmacy for you to pick up at your convenience: Reglan 10 mg, please take one tablet by mouth at bedtime   You have been scheduled for an endoscopy with propofol. Please follow written instructions given to you at your visit today. If you use inhalers (even only as needed), please bring them with you on the day of your procedure. Your physician has requested that you go to www.startemmi.com and enter the access code given to you at your visit today. This web site gives a general overview about your procedure. However, you should still follow specific instructions given to you by our office regarding your preparation for the procedure. _________________________________________________________________________________________________________________________________________________________________________________________  You have been scheduled for an esophageal manometry at [facility] on 08-25-13 at 8 am. Please arrive 30 minutes prior to your procedure for registration. You will need to go to outpatient registration (1st floor of the hospital) first. Make certain to bring your insurance cards as well as a complete list of medications.  Please remember the following:  1) Nothing to eat or drink after 12:00 midnight on the night before your test.  2) Hold all diabetic medications/insulin the morning of the test. You may eat and take your medications after the test.  3) For 3 days prior to your test do not take: Dexilant, Prevacid, Nexium, Protonix, Aciphex, Zegerid, Pantoprazole, Prilosec or omeprazole.  4) For 2 days prior to your test, do not take: Reglan, Tagamet, Zantac,  Axid or Pepcid.  5) You MAY use an antacid such as Rolaids or Tums up to 12 hours prior to your test.  It will take at least 2 weeks to receive the results of this test from your physician. ------------------------------------------ ABOUT ESOPHAGEAL MANOMETRY Esophageal manometry (muh-NOM-uh-tree) is a test that gauges how well your esophagus works. Your esophagus is the long, muscular tube that connects your throat to your stomach. Esophageal manometry measures the rhythmic muscle contractions (peristalsis) that occur in your esophagus when you swallow. Esophageal manometry also measures the coordination and force exerted by the muscles of your esophagus.  During esophageal manometry, a thin, flexible tube (catheter) that contains sensors is passed through your nose, down your esophagus and into your stomach. Esophageal manometry can be helpful in diagnosing some mostly uncommon disorders that affect your esophagus.  Why it's done Esophageal manometry is used to evaluate the movement (motility) of food through the esophagus and into the stomach. The test measures how well the circular bands of muscle (sphincters) at the top and bottom of your esophagus open and close, as well as the pressure, strength and pattern of the wave of esophageal muscle contractions that moves food along.  What you can expect Esophageal manometry is an outpatient procedure done without sedation. Most people tolerate it well. You may be asked to change into a hospital gown before the test starts.  During esophageal manometry  While you are sitting up, a member of your health care team sprays your throat with a numbing medication or puts numbing gel in your nose or both.  A catheter is guided through your nose into your esophagus. The catheter may be sheathed in a water-filled sleeve. It doesn't interfere with your breathing. However, your  eyes may water, and you may gag. You may have a slight nosebleed from irritation.   After the catheter is in place, you may be asked to lie on your back on an exam table, or you may be asked to remain seated.  You then swallow small sips of water. As you do, a computer connected to the catheter records the pressure, strength and pattern of your esophageal muscle contractions.  During the test, you'll be asked to breathe slowly and smoothly, remain as still as possible, and swallow only when you're asked to do so.  A member of your health care team may move the catheter down into your stomach while the catheter continues its measurements.  The catheter then is slowly withdrawn. The test usually lasts 20 to 30 minutes.  After esophageal manometry  When your esophageal manometry is complete, you may return to your normal activities  This test typically takes 30-45 minutes to complete. ________________________________________________________________________________  Gastroesophageal Reflux Disease, Adult Gastroesophageal reflux disease (GERD) happens when acid from your stomach flows up into the esophagus. When acid comes in contact with the esophagus, the acid causes soreness (inflammation) in the esophagus. Over time, GERD may create small holes (ulcers) in the lining of the esophagus. CAUSES   Increased body weight. This puts pressure on the stomach, making acid rise from the stomach into the esophagus.  Smoking. This increases acid production in the stomach.  Drinking alcohol. This causes decreased pressure in the lower esophageal sphincter (valve or ring of muscle between the esophagus and stomach), allowing acid from the stomach into the esophagus.  Late evening meals and a full stomach. This increases pressure and acid production in the stomach.  A malformed lower esophageal sphincter. Sometimes, no cause is found. SYMPTOMS   Burning pain in the lower part of the mid-chest behind the breastbone and in the mid-stomach area. This may occur twice a week or more  often.  Trouble swallowing.  Sore throat.  Dry cough.  Asthma-like symptoms including chest tightness, shortness of breath, or wheezing. DIAGNOSIS  Your caregiver may be able to diagnose GERD based on your symptoms. In some cases, X-rays and other tests may be done to check for complications or to check the condition of your stomach and esophagus. TREATMENT  Your caregiver may recommend over-the-counter or prescription medicines to help decrease acid production. Ask your caregiver before starting or adding any new medicines.  HOME CARE INSTRUCTIONS   Change the factors that you can control. Ask your caregiver for guidance concerning weight loss, quitting smoking, and alcohol consumption.  Avoid foods and drinks that make your symptoms worse, such as:  Caffeine or alcoholic drinks.  Chocolate.  Peppermint or mint flavorings.  Garlic and onions.  Spicy foods.  Citrus fruits, such as oranges, lemons, or limes.  Tomato-based foods such as sauce, chili, salsa, and pizza.  Fried and fatty foods.  Avoid lying down for the 3 hours prior to your bedtime or prior to taking a nap.  Eat small, frequent meals instead of large meals.  Wear loose-fitting clothing. Do not wear anything tight around your waist that causes pressure on your stomach.  Raise the head of your bed 6 to 8 inches with wood blocks to help you sleep. Extra pillows will not help.  Only take over-the-counter or prescription medicines for pain, discomfort, or fever as directed by your caregiver.  Do not take aspirin, ibuprofen, or other nonsteroidal anti-inflammatory drugs (NSAIDs). SEEK IMMEDIATE MEDICAL CARE IF:   You  have pain in your arms, neck, jaw, teeth, or back.  Your pain increases or changes in intensity or duration.  You develop nausea, vomiting, or sweating (diaphoresis).  You develop shortness of breath, or you faint.  Your vomit is green, yellow, black, or looks like coffee grounds or  blood.  Your stool is red, bloody, or black. These symptoms could be signs of other problems, such as heart disease, gastric bleeding, or esophageal bleeding. MAKE SURE YOU:   Understand these instructions.  Will watch your condition.  Will get help right away if you are not doing well or get worse. Document Released: 06/07/2005 Document Revised: 11/20/2011 Document Reviewed: 03/17/2011 [facility] Patient Information 2014 [place], [place].

## 2013-08-14 NOTE — Addendum Note (Signed)
Addended by: Ok Anis A on: 08/14/2013 09:41 AM   Modules accepted: Orders

## 2013-08-20 ENCOUNTER — Ambulatory Visit (AMBULATORY_SURGERY_CENTER): Payer: 59 | Admitting: Gastroenterology

## 2013-08-20 ENCOUNTER — Encounter: Payer: Self-pay | Admitting: Gastroenterology

## 2013-08-20 VITALS — BP 137/94 | HR 72 | Temp 98.1°F | Resp 9 | Ht 71.0 in | Wt 250.0 lb

## 2013-08-20 DIAGNOSIS — R109 Unspecified abdominal pain: Secondary | ICD-10-CM

## 2013-08-20 DIAGNOSIS — K219 Gastro-esophageal reflux disease without esophagitis: Secondary | ICD-10-CM

## 2013-08-20 DIAGNOSIS — R079 Chest pain, unspecified: Secondary | ICD-10-CM

## 2013-08-20 DIAGNOSIS — D130 Benign neoplasm of esophagus: Secondary | ICD-10-CM

## 2013-08-20 MED ORDER — SODIUM CHLORIDE 0.9 % IV SOLN: 500.0000 mL | INTRAVENOUS | Status: DC

## 2013-08-20 NOTE — Progress Notes (Signed)
Called to room to assist after endoscopic procedure.  Patient ID and intended procedure confirmed with present staff. Received instructions for my participation in the procedure from the performing endo. tech.

## 2013-08-20 NOTE — Progress Notes (Signed)
Report to pacu rn, vss, bbs=clear 

## 2013-08-20 NOTE — Progress Notes (Signed)
Patient did not have preoperative order for IV antibiotic SSI prophylaxis. (G8918)  Patient did not experience any of the following events: a burn prior to discharge; a fall within the facility; wrong site/side/patient/procedure/implant event; or a hospital transfer or hospital admission upon discharge from the facility. (G8907)  

## 2013-08-20 NOTE — Patient Instructions (Signed)
YOU HAD AN ENDOSCOPIC PROCEDURE TODAY AT THE [facility]: Refer to the procedure report that was given to you for any specific questions about what was found during the examination.  If the procedure report does not answer your questions, please call your gastroenterologist to clarify.  If you requested that your care partner not be given the details of your procedure findings, then the procedure report has been included in a sealed envelope for you to review at your convenience later.  YOU SHOULD EXPECT: Some feelings of bloating in the abdomen. Passage of more gas than usual.  Walking can help get rid of the air that was put into your GI tract during the procedure and reduce the bloating. If you had a lower endoscopy (such as a colonoscopy or flexible sigmoidoscopy) you may notice spotting of blood in your stool or on the toilet paper. If you underwent a bowel prep for your procedure, then you may not have a normal bowel movement for a few days.  DIET: Your first meal following the procedure should be a light meal and then it is ok to progress to your normal diet.  A half-sandwich or bowl of soup is an example of a good first meal.  Heavy or fried foods are harder to digest and may make you feel nauseous or bloated.  Likewise meals heavy in dairy and vegetables can cause extra gas to form and this can also increase the bloating.  Drink plenty of fluids but you should avoid alcoholic beverages for 24 hours.  ACTIVITY: Your care partner should take you home directly after the procedure.  You should plan to take it easy, moving slowly for the rest of the day.  You can resume normal activity the day after the procedure however you should NOT DRIVE or use heavy machinery for 24 hours (because of the sedation medicines used during the test).    SYMPTOMS TO REPORT IMMEDIATELY: A gastroenterologist can be reached at any hour.  During normal business hours, 8:30 AM to 5:00 PM Monday through Friday,  call [phone].  After hours and on weekends, please call the GI answering service at [phone] who will take a message and have the physician on call contact you.   Following upper endoscopy (EGD)  Vomiting of blood or coffee ground material  New chest pain or pain under the shoulder blades  Painful or persistently difficult swallowing  New shortness of breath  Fever of 100F or higher  Black, tarry-looking stools  FOLLOW UP: If any biopsies were taken you will be contacted by phone or by letter within the next 1-3 weeks.  Call your gastroenterologist if you have not heard about the biopsies in 3 weeks.  Our staff will call the home number listed on your records the next business day following your procedure to check on you and address any questions or concerns that you may have at that time regarding the information given to you following your procedure. This is a courtesy call and so if there is no answer at the home number and we have not heard from you through the emergency physician on call, we will assume that you have returned to your regular daily activities without incident.  SIGNATURES/CONFIDENTIALITY: You and/or your care partner have signed paperwork which will be entered into your electronic medical record.  These signatures attest to the fact that that the information above on your After Visit Summary has been reviewed and is understood.  Full responsibility   of the confidentiality of this discharge information lies with you and/or your care-partner.  Recommendations See procedure report 

## 2013-08-20 NOTE — Op Note (Signed)
[facility] [address] [place], [address]   ENDOSCOPY PROCEDURE REPORT  PATIENT: Jolan, Upchurch  MR#: [id number] BIRTHDATE: 30-Jun-1966 , 47  yrs. old GENDER: Male ENDOSCOPIST:Britteny Fiebelkorn Hale Bogus, MD, [facility] REFERRED BY: PROCEDURE DATE:  08/20/2013 PROCEDURE:   EGD w/ biopsy and EGD w/ biopsy for H.pylori ASA CLASS:    Class II INDICATIONS: Chest pain. MEDICATION: propofol (Diprivan) 200mg  IV TOPICAL ANESTHETIC:   Cetacaine Spray  DESCRIPTION OF PROCEDURE:   After the risks and benefits of the procedure were explained, informed consent was obtained.  The LB [id number]  endoscope was introduced through the mouth  and advanced to the second portion of the duodenum .  The instrument was slowly withdrawn as the mucosa was fully examined.      DUODENUM: The duodenal mucosa showed no abnormalities in the bulb and second portion of the duodenum.  STOMACH: The mucosa of the stomach appeared normal. CLO Bx. done....   ESOPHAGUS: The mucosa of the esophagus appeared normal. Random biopsies done.   Retroflexed views revealed a small hiatal hernia. The scope was then withdrawn from the patient and the procedure completed.  COMPLICATIONS: There were no complications.   ENDOSCOPIC IMPRESSION: 1.   The duodenal mucosa showed no abnormalities in the bulb and second portion of the duodenum 2.   The mucosa of the stomach appeared normal 3.   The mucosa of the esophagus appeared normal 4.  Noncardiac chest pain and normal endoscopy,r/o eosinophilic esophagitis vs esophageal motility disorder vs. functional problems.  RECOMMENDATIONS: 1.  Await pathology results 2.  Continue current medications 3 Esophageal manometry needed 4.Consider pain clinic referral    _______________________________ eSigned:  Mardella Layman, MD, [facility] 08/20/2013 9:43 AM   standard discharge   PATIENT NAME:  Hunter, Bachar MR#: [id number]

## 2013-08-21 ENCOUNTER — Telehealth: Payer: Self-pay | Admitting: *Deleted

## 2013-08-21 NOTE — Telephone Encounter (Signed)
  Follow up Call-  Call back number 08/20/2013 08/16/2012  Post procedure Call Back phone  # [phone]  Permission to leave phone message Yes Yes     Patient questions:  Do you have a fever, pain , or abdominal swelling? no Pain Score  0 *  Have you tolerated food without any problems? yes  Have you been able to return to your normal activities? yes  Do you have any questions about your discharge instructions: Diet   no Medications  no Follow up visit  no  Do you have questions or concerns about your Care? no  Actions: * If pain score is 4 or above: No action needed, pain <4.

## 2013-08-22 ENCOUNTER — Encounter: Payer: Self-pay | Admitting: Gastroenterology

## 2013-08-22 LAB — HELICOBACTER PYLORI SCREEN-BIOPSY: UREASE: NEGATIVE

## 2013-08-25 ENCOUNTER — Ambulatory Visit (HOSPITAL_COMMUNITY)
Admission: RE | Admit: 2013-08-25 | Discharge: 2013-08-25 | Disposition: A | Discharge status: Home / Self Care | Payer: 59 | Source: Ambulatory Visit | Attending: Gastroenterology | Admitting: Gastroenterology

## 2013-08-26 ENCOUNTER — Encounter: Payer: Self-pay | Admitting: Gastroenterology

## 2013-09-01 ENCOUNTER — Ambulatory Visit (HOSPITAL_COMMUNITY)
Admission: RE | Admit: 2013-09-01 | Discharge: 2013-09-01 | Disposition: A | Discharge status: Home / Self Care | Payer: 59 | Source: Ambulatory Visit | Attending: Gastroenterology | Admitting: Gastroenterology

## 2013-09-01 ENCOUNTER — Encounter (HOSPITAL_COMMUNITY)
Admission: RE | Disposition: A | Discharge status: Home / Self Care | Payer: Self-pay | Source: Ambulatory Visit | Attending: Gastroenterology

## 2013-09-01 DIAGNOSIS — K219 Gastro-esophageal reflux disease without esophagitis: Secondary | ICD-10-CM

## 2013-09-01 DIAGNOSIS — K220 Achalasia of cardia: Secondary | ICD-10-CM | POA: Insufficient documentation

## 2013-09-01 HISTORY — PX: ESOPHAGEAL MANOMETRY: SHX5429

## 2013-09-01 SURGERY — MANOMETRY, ESOPHAGUS

## 2013-09-01 MED ORDER — LIDOCAINE VISCOUS 2 % MT SOLN
OROMUCOSAL | Status: AC
  Filled 2013-09-01: qty 15

## 2013-09-02 ENCOUNTER — Encounter (HOSPITAL_COMMUNITY): Payer: Self-pay | Admitting: Gastroenterology

## 2013-09-02 ENCOUNTER — Telehealth: Payer: Self-pay | Admitting: *Deleted

## 2013-09-02 MED ORDER — METOCLOPRAMIDE HCL 5 MG PO TABS: 5.0000 mg | ORAL_TABLET | Freq: Three times a day (TID) | ORAL | Status: DC

## 2013-09-02 NOTE — Telephone Encounter (Signed)
Informed pt of incompetent sphincter on EM. Dr Jarold Motto would like for him to begin Reglan AC TID and schedule a f/u appt. Pt will f/u on 09/25/13. Pt had 10mg  Reglan ordered for bedtime use. He states he never started it because he read the side effects. He will discuss this with his wife and he may take it short term until he sees Dr Jarold Motto.

## 2013-09-25 ENCOUNTER — Ambulatory Visit: Payer: Self-pay | Admitting: Gastroenterology

## 2013-10-10 ENCOUNTER — Encounter: Payer: Self-pay | Admitting: Gastroenterology

## 2013-10-10 ENCOUNTER — Ambulatory Visit (INDEPENDENT_AMBULATORY_CARE_PROVIDER_SITE_OTHER): Payer: BC Managed Care – PPO | Admitting: Gastroenterology

## 2013-10-10 VITALS — BP 142/100 | HR 68 | Ht 71.5 in | Wt 254.0 lb

## 2013-10-10 DIAGNOSIS — R7309 Other abnormal glucose: Secondary | ICD-10-CM

## 2013-10-10 DIAGNOSIS — E785 Hyperlipidemia, unspecified: Secondary | ICD-10-CM

## 2013-10-10 DIAGNOSIS — R109 Unspecified abdominal pain: Secondary | ICD-10-CM

## 2013-10-10 DIAGNOSIS — K219 Gastro-esophageal reflux disease without esophagitis: Secondary | ICD-10-CM

## 2013-10-10 DIAGNOSIS — R739 Hyperglycemia, unspecified: Secondary | ICD-10-CM

## 2013-10-10 DIAGNOSIS — IMO0001 Reserved for inherently not codable concepts without codable children: Secondary | ICD-10-CM

## 2013-10-10 DIAGNOSIS — K449 Diaphragmatic hernia without obstruction or gangrene: Secondary | ICD-10-CM

## 2013-10-10 DIAGNOSIS — I10 Essential (primary) hypertension: Secondary | ICD-10-CM

## 2013-10-10 DIAGNOSIS — F411 Generalized anxiety disorder: Secondary | ICD-10-CM

## 2013-10-10 MED ORDER — NEXIUM 40 MG PO CPDR: 40.0000 mg | DELAYED_RELEASE_CAPSULE | Freq: Two times a day (BID) | ORAL | Status: DC

## 2013-10-10 MED ORDER — METOCLOPRAMIDE HCL 5 MG PO TABS: 5.0000 mg | ORAL_TABLET | Freq: Three times a day (TID) | ORAL | Status: DC

## 2013-10-10 NOTE — Patient Instructions (Signed)
We have sent the following medications to your pharmacy for you to pick up at your convenience: Reglan 10 mg, please take one tablet by mouth before meals and at bedtime Nexium  Please follow up with Dr. Hilarie Fredrickson in one month

## 2013-10-10 NOTE — Progress Notes (Signed)
This is a 48-year-old [place] American male with refractory GERD consisting mostly of sharp substernal chest pain it does radiate into his back.  He's had endoscopy, an esophageal manometry.  Endoscopy was fairly unremarkable as were mucosal biopsies of the esophagus.  His manometry shown increased mucosal breaks, low amplitude propulsion with 20% probably progressive waves, and large number of mucosal breaks, all consistent with a nonspecific esophageal motility disorder.  His LES pressure was borderline at 15.  He is somewhat better on Reglan 5 mg 3 times a day before meals he is taking Nexium 40 mg twice a day.  When necessary Carafate also helps.  He denies any cardiovascular pulmonary complaints.  Previous ultrasound the abdomen is been negative.  Current Medications, Allergies, Past Medical History, Past Surgical History, Family History and Social History were reviewed in [employer] record.  ROS: All systems were reviewed and are negative unless otherwise stated in the HPI.          Physical Exam: Heavyset patient in no distress.  Blood pressure 142/100, pulse 68 and regular and weight 254 with a BMI of 34.94.  I cannot appreciate stigmata of chronic liver disease.  Chest is clear he appears to be in a regular rhythm without murmurs gallops or rubs.  His abdomen shows no organomegaly, masses or tenderness.  Bowel sounds are normal.  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  20% of esophageal waves were nonpropulsive.  I've increased his Reglan to 10 mg 3 times a day before meals and will continue twice a day Nexium.  He is to see Dr. Hilarie Fredrickson in followup in 4-6 weeks' time to see how he is doing symptomatically.  Standard antireflux maneuvers reviewed.  He appears stable from a cardiovascular standpoint but does  have borderline hypertension.  He is on antihypertensive medications.DR Gwenette Greet in pulmonary manages his obstructive sleep apnea.  Cc Dr. Penni Homans and DR. Danton Sewer

## 2013-10-14 ENCOUNTER — Encounter: Payer: Self-pay | Admitting: Pulmonary Disease

## 2013-10-14 ENCOUNTER — Ambulatory Visit (INDEPENDENT_AMBULATORY_CARE_PROVIDER_SITE_OTHER): Payer: BC Managed Care – PPO | Admitting: Pulmonary Disease

## 2013-10-14 VITALS — BP 142/90 | HR 72 | Temp 97.7°F | Ht 72.0 in | Wt 254.0 lb

## 2013-10-14 DIAGNOSIS — G4733 Obstructive sleep apnea (adult) (pediatric): Secondary | ICD-10-CM

## 2013-10-14 NOTE — Progress Notes (Signed)
   Subjective:    Patient ID: [name] Foster, male    DOB: [birth year], 48 y.o.   MRN: [id number]  HPI The patient comes in today for followup of his obstructive sleep apnea. He was started on CPAP a year ago, and never followed up and never called our office to reschedule. He currently is not wearing CPAP, and is complaining the device is not working properly. He feels that it shuts off during the night, and he feels that he is smothering. His download shows that he is not wearing the device compliantly. He denies any issues with the CPAP mask.   Review of Systems  Constitutional: Negative for fever and unexpected weight change.  HENT: Negative for congestion, dental problem, ear pain, nosebleeds, postnasal drip, rhinorrhea, sinus pressure, sneezing, sore throat and trouble swallowing.   Eyes: Negative for redness and itching.  Respiratory: Positive for shortness of breath. Negative for cough, chest tightness and wheezing.   Cardiovascular: Positive for chest pain. Negative for palpitations and leg swelling.  Gastrointestinal: Negative for nausea and vomiting.  Genitourinary: Negative for dysuria.  Musculoskeletal: Negative for joint swelling.  Skin: Negative for rash.  Neurological: Negative for headaches.  Hematological: Does not bruise/bleed easily.  Psychiatric/Behavioral: Negative for dysphoric mood. The patient is not nervous/anxious.        Objective:   Physical Exam Overweight male in no acute distress Nose without purulence or discharge noted No skin breakdown or pressure necrosis from the CPAP mask Neck without lymphadenopathy or thyromegaly Lower extremities without edema, no cyanosis Alert and oriented, moves all 4 extremities        Assessment & Plan:

## 2013-10-14 NOTE — Assessment & Plan Note (Signed)
The patient has a history of mild to moderate OSA, but has not been compliant with his device. He thinks there is something wrong with his CPAP machine, and we will have it tested by his home care company. We will also set his device on a fixed pressure with no gradient, and get a download in a few weeks to see how he is doing with this. I've asked him to call me if he is not doing well, and I've also asked him to be compliant with his followup visit in 8 weeks.

## 2013-10-14 NOTE — Patient Instructions (Signed)
Will have your cpap device checked by [facility], and will set on 10cm with no ramp. Will have them download your machine in 2 weeks. Will see you back here in 8 weeks.  Please call if you continue to have issues with tolerance.

## 2013-10-21 ENCOUNTER — Encounter: Payer: Self-pay | Admitting: *Deleted

## 2013-10-21 ENCOUNTER — Telehealth: Payer: Self-pay | Admitting: Pulmonary Disease

## 2013-10-21 NOTE — Telephone Encounter (Signed)
Spoke w/ [facility]. Was advised they did not receive the order on pt. I advised will refax this over. Received confirmation this did go through to [phone]. Pt is aware and needed nothing further

## 2013-10-27 ENCOUNTER — Other Ambulatory Visit: Payer: Self-pay | Admitting: Family Medicine

## 2013-10-30 ENCOUNTER — Telehealth: Payer: Self-pay | Admitting: Family Medicine

## 2013-10-30 ENCOUNTER — Encounter: Payer: Self-pay | Admitting: Family Medicine

## 2013-10-30 ENCOUNTER — Ambulatory Visit (INDEPENDENT_AMBULATORY_CARE_PROVIDER_SITE_OTHER): Payer: BC Managed Care – PPO | Admitting: Family Medicine

## 2013-10-30 VITALS — BP 138/84 | HR 63 | Temp 98.1°F | Ht 72.0 in | Wt 256.1 lb

## 2013-10-30 DIAGNOSIS — R7309 Other abnormal glucose: Secondary | ICD-10-CM

## 2013-10-30 DIAGNOSIS — R739 Hyperglycemia, unspecified: Secondary | ICD-10-CM

## 2013-10-30 DIAGNOSIS — E785 Hyperlipidemia, unspecified: Secondary | ICD-10-CM

## 2013-10-30 DIAGNOSIS — R072 Precordial pain: Secondary | ICD-10-CM

## 2013-10-30 DIAGNOSIS — K219 Gastro-esophageal reflux disease without esophagitis: Secondary | ICD-10-CM

## 2013-10-30 DIAGNOSIS — I10 Essential (primary) hypertension: Secondary | ICD-10-CM

## 2013-10-30 DIAGNOSIS — E669 Obesity, unspecified: Secondary | ICD-10-CM

## 2013-10-30 LAB — RENAL FUNCTION PANEL
Albumin: 4.6 g/dL (ref 3.5–5.2)
BUN: 14 mg/dL (ref 6–23)
CALCIUM: 9.5 mg/dL (ref 8.4–10.5)
CO2: 26 mEq/L (ref 19–32)
CREATININE: 0.93 mg/dL (ref 0.50–1.35)
Chloride: 103 mEq/L (ref 96–112)
Glucose, Bld: 89 mg/dL (ref 70–99)
Phosphorus: 3.3 mg/dL (ref 2.3–4.6)
Potassium: 3.7 mEq/L (ref 3.5–5.3)
SODIUM: 138 meq/L (ref 135–145)

## 2013-10-30 LAB — TSH: TSH: 2.656 u[IU]/mL (ref 0.350–4.500)

## 2013-10-30 LAB — HEPATIC FUNCTION PANEL
ALBUMIN: 4.6 g/dL (ref 3.5–5.2)
ALK PHOS: 71 U/L (ref 39–117)
ALT: 46 U/L (ref 0–53)
AST: 29 U/L (ref 0–37)
Bilirubin, Direct: 0.1 mg/dL (ref 0.0–0.3)
Indirect Bilirubin: 0.3 mg/dL (ref 0.2–1.2)
Total Bilirubin: 0.4 mg/dL (ref 0.2–1.2)
Total Protein: 7.5 g/dL (ref 6.0–8.3)

## 2013-10-30 LAB — CBC
HCT: 43.8 % (ref 39.0–52.0)
Hemoglobin: 15.1 g/dL (ref 13.0–17.0)
MCH: 28.5 pg (ref 26.0–34.0)
MCHC: 34.5 g/dL (ref 30.0–36.0)
MCV: 82.6 fL (ref 78.0–100.0)
PLATELETS: 227 10*3/uL (ref 150–400)
RBC: 5.3 MIL/uL (ref 4.22–5.81)
RDW: 14.6 % (ref 11.5–15.5)
WBC: 4.3 10*3/uL (ref 4.0–10.5)

## 2013-10-30 LAB — LIPID PANEL
Cholesterol: 186 mg/dL (ref 0–200)
HDL: 42 mg/dL (ref >39)
LDL Cholesterol: 115 mg/dL — ABNORMAL HIGH (ref 0–99)
Total CHOL/HDL Ratio: 4.4 Ratio
Triglycerides: 143 mg/dL (ref <150)
VLDL: 29 mg/dL (ref 0–40)

## 2013-10-30 LAB — HEMOGLOBIN A1C
HEMOGLOBIN A1C: 6.1 % — AB (ref <5.7)
MEAN PLASMA GLUCOSE: 128 mg/dL — AB (ref <117)

## 2013-10-30 MED ORDER — ATORVASTATIN CALCIUM 20 MG PO TABS: 40.0000 mg | ORAL_TABLET | Freq: Every day | ORAL | Status: DC

## 2013-10-30 MED ORDER — AMLODIPINE BESYLATE 10 MG PO TABS: 10.0000 mg | ORAL_TABLET | Freq: Every day | ORAL | Status: DC

## 2013-10-30 NOTE — Patient Instructions (Signed)
Add fiber such as Benefiber or Metamucil Call gastroenterology in a month for appt if no word   Basic Carbohydrate Counting Basic carbohydrate counting is a way to plan meals. It is done by counting the amount of carbohydrate in foods. Foods that have carbohydrates are starches (grains, beans, starchy vegetables) and sweets. Eating carbohydrates increases blood glucose (sugar) levels. People with diabetes use carbohydrate counting to help keep their blood glucose at a normal level.  COUNTING CARBOHYDRATES IN FOODS The first step in counting carbohydrates is to learn how many carbohydrate servings you should have in every meal. A dietitian can plan this for you. After learning the amount of carbohydrates to include in your meal plan, you can start to choose the carbohydrate-containing foods you want to eat.  There are 2 ways to identify the amount of carbohydrates in the foods you eat.  Read the Nutrition Facts panel on food labels. You need 2 pieces of information from the Nutrition Facts panel to count carbohydrates this way:  Serving size.  Total carbohydrate (in grams). Decide how many servings you will be eating. If it is 1 serving, you will be eating the amount of carbohydrate listed on the panel. If you will be eating 2 servings, you will be eating double the amount of carbohydrate listed on the panel.   Learn serving sizes. A serving size of most carbohydrate-containing foods is about 15 grams (g). Listed below are single serving sizes of common carbohydrate-containing foods:  1 slice bread.   cup unsweetened, dry cereal.   cup hot cereal.   cup rice.   cup mashed potatoes.   cup pasta.  1 cup fresh fruit.   cup canned fruit.  1 cup milk (whole, 2%, or skim).   cup starchy vegetables (peas, corn, or potatoes). Counting carbohydrates this way is similar to looking on the Nutrition Facts panel. Decide how many servings you will eat first. Multiply the number of  servings you eat by 15 g. For example, if you have 2 cups of strawberries, you had 2 servings. That means you had 30 g of carbohydrate (2 servings x 15 g = 30 g). CALCULATING CARBOHYDRATES IN A MEAL Sample dinner  3 oz chicken breast.   cup brown rice.   cup corn.  1 cup fat-free milk.  1 cup strawberries with sugar-free whipped topping. Carbohydrate calculation First, identify the foods that contain carbohydrate:  Rice.  Corn.  Milk.  Strawberries. Calculate the number of servings eaten:  2 servings rice.  1 serving corn.  1 serving milk.  1 serving strawberries. Multiply the number of servings by 15 g:  2 servings rice x 15 g = 30 g.  1 serving corn x 15 g = 15 g.  1 serving milk x 15 g = 15 g.  1 serving strawberries x 15 g = 15 g. Add the amounts to find the total carbohydrates eaten: 30 g + 15 g + 15 g + 15 g = 75 g carbohydrate eaten at dinner. Document Released: 08/28/2005 Document Revised: 11/20/2011 Document Reviewed: 07/14/2011 [facility] Patient Information 2014 [place], [place].

## 2013-10-30 NOTE — Telephone Encounter (Signed)
LAB ORDER WEEK OF 02-23-2014 Labs at or prior lipid, renal, cbc, tsh, hepatic, hgba1c

## 2013-10-30 NOTE — Progress Notes (Signed)
Pre visit review using our clinic review tool, if applicable. No additional management support is needed unless otherwise documented below in the visit note. 

## 2013-10-31 ENCOUNTER — Telehealth: Payer: Self-pay | Admitting: Family Medicine

## 2013-10-31 NOTE — Telephone Encounter (Signed)
Relevant patient education assigned to patient using Emmi. ° °

## 2013-11-01 NOTE — Telephone Encounter (Signed)
Orders entered as below. 

## 2013-11-02 ENCOUNTER — Encounter: Payer: Self-pay | Admitting: Family Medicine

## 2013-11-02 NOTE — Assessment & Plan Note (Signed)
Tolerating Atorvastatin, avoid trans fats, increase exercise

## 2013-11-02 NOTE — Assessment & Plan Note (Signed)
Avoid offending foods, small, frequent meals, continue current meds including Reglan and follow up with GI

## 2013-11-02 NOTE — Progress Notes (Signed)
Patient ID: [name] Foster, male   DOB: 08/27/1966, 48 y.o.   MRN: [id number] [name] Foster Mom [id number] Dec 13, 1965 11/02/2013      Progress Note-Follow Up  Subjective  Chief Complaint  Chief Complaint  Patient presents with  . Follow-up    2 month    HPI  Patient is a 48 rolled African American male who is in today for well. She's feeling somewhat better. He's been followed by gastroenterology now and is doing better with the addition of Reglan. With the improvement in his TM motility he's had less chest pain. Continues to take his other medications for reflux with good results. No recent episodes of chest pain, palpitations or shortness of breath. No fevers or chills. Is taking medications as prescribed.  Past Medical History  Diagnosis Date  . Mixed hyperlipidemia   . Hypertension   . Bone spur     left foot  . OSA (obstructive sleep apnea)     s/p UPPP  . Murmur   . Stomach ulcer     from PCP  . Erectile dysfunction 01/15/2013  . Esophageal reflux 01/15/2013  . Hyperglycemia 07/13/2013  . Hiatal hernia     Past Surgical History  Procedure Laterality Date  . Tonsillectomy    . Uvulectomy    . Esophageal manometry N/A 09/01/2013    Procedure: ESOPHAGEAL MANOMETRY (EM);  Surgeon: Sable Feil, MD;  Location: [facility];  Service: Endoscopy;  Laterality: N/A;    Family History  Problem Relation Age of Onset  . Dementia Mother   . Diabetes Mother   . Hypertension Mother   . Heart failure Mother   . Hypertension      siblings  . Colon cancer Neg Hx       Current Outpatient Prescriptions on File Prior to Visit  Medication Sig Dispense Refill  . acetaminophen (TYLENOL) 500 MG tablet Take 1 tablet (500 mg total) by mouth every 6 (six) hours as needed for pain.  60 tablet  0  . aspirin EC 81 MG tablet Take 81 mg by mouth daily.      . cyclobenzaprine (FLEXERIL) 10 MG tablet Take 1 tablet (10 mg total) by mouth 3 (three) times daily as needed for muscle spasms.  30  tablet  0  . ibuprofen (ADVIL,MOTRIN) 600 MG tablet Take 1 tablet (600 mg total) by mouth 3 (three) times daily as needed.  90 tablet  1  . lisinopril-hydrochlorothiazide (PRINZIDE,ZESTORETIC) 20-12.5 MG per tablet TAKE 1 TABLET BY MOUTH DAILY. *DUE FOR FOLLOW-UP OFFICE VISIT*  90 tablet  3  . metoCLOPramide (REGLAN) 5 MG tablet Take 1 tablet (5 mg total) by mouth 4 (four) times daily -  before meals and at bedtime.  120 tablet  3  . NEXIUM 40 MG capsule Take 1 capsule (40 mg total) by mouth 2 (two) times daily.  60 capsule  3  . promethazine (PHENERGAN) 25 MG tablet Take 1 tablet (25 mg total) by mouth every 8 (eight) hours as needed for nausea or vomiting.  20 tablet  1  . sucralfate (CARAFATE) 1 GM/10ML suspension Take 10 mLs (1 g total) by mouth 4 (four) times daily.  420 mL  3  . tadalafil (CIALIS) 5 MG tablet Take 1 tablet (5 mg total) by mouth daily as needed for erectile dysfunction.  30 tablet  5   No current facility-administered medications on file prior to visit.    No Known Allergies  Review of Systems  Review of  Systems  Constitutional: Negative for fever and malaise/fatigue.  HENT: Negative for congestion.   Eyes: Negative for discharge.  Respiratory: Negative for shortness of breath.   Cardiovascular: Negative for chest pain, palpitations and leg swelling.  Gastrointestinal: Negative for nausea, abdominal pain and diarrhea.  Genitourinary: Negative for dysuria.  Musculoskeletal: Negative for falls.  Skin: Negative for rash.  Neurological: Negative for loss of consciousness and headaches.  Endo/Heme/Allergies: Negative for polydipsia.  Psychiatric/Behavioral: Negative for depression and suicidal ideas. The patient is not nervous/anxious and does not have insomnia.     Objective  BP 138/84  Pulse 63  Temp(Src) 98.1 F (36.7 C) (Oral)  Ht 6' (1.829 m)  Wt 256 lb 1.3 oz (116.157 kg)  BMI 34.72 kg/m2  SpO2 97%  Physical Exam  Physical Exam  Constitutional: He  is oriented to person, place, and time and well-developed, well-nourished, and in no distress. No distress.  HENT:  Head: Normocephalic and atraumatic.  Eyes: Conjunctivae are normal.  Neck: Neck supple. No thyromegaly present.  Cardiovascular: Normal rate, regular rhythm and normal heart sounds.   No murmur heard. Pulmonary/Chest: Effort normal and breath sounds normal. No respiratory distress.  Abdominal: He exhibits no distension and no mass. There is no tenderness.  Musculoskeletal: He exhibits no edema.  Neurological: He is alert and oriented to person, place, and time.  Skin: Skin is warm.  Psychiatric: Memory, affect and judgment normal.    Lab Results  Component Value Date   TSH 2.656 10/30/2013   Lab Results  Component Value Date   WBC 4.3 10/30/2013   HGB 15.1 10/30/2013   HCT 43.8 10/30/2013   MCV 82.6 10/30/2013   PLT 227 10/30/2013   Lab Results  Component Value Date   CREATININE 0.93 10/30/2013   BUN 14 10/30/2013   NA 138 10/30/2013   K 3.7 10/30/2013   CL 103 10/30/2013   CO2 26 10/30/2013   Lab Results  Component Value Date   ALT 46 10/30/2013   AST 29 10/30/2013   ALKPHOS 71 10/30/2013   BILITOT 0.4 10/30/2013   Lab Results  Component Value Date   CHOL 186 10/30/2013   Lab Results  Component Value Date   HDL 42 10/30/2013   Lab Results  Component Value Date   LDLCALC 115* 10/30/2013   Lab Results  Component Value Date   TRIG 143 10/30/2013   Lab Results  Component Value Date   CHOLHDL 4.4 10/30/2013     Assessment & Plan  HYPERTENSION, UNSPECIFIED Improved control on recheck, no changes  HYPERLIPIDEMIA-MIXED Tolerating Atorvastatin, avoid trans fats, increase exercise  OBESITY Encouraged DASH diet and exercise  Substernal chest pain Improved with treatment of GU dysmotility  Hyperglycemia Mild simple carbs and increase exercise  Esophageal reflux Avoid offending foods, small, frequent meals, continue current meds including Reglan and  follow up with GI

## 2013-11-02 NOTE — Assessment & Plan Note (Signed)
Improved with treatment of GU dysmotility

## 2013-11-02 NOTE — Assessment & Plan Note (Signed)
Improved control on recheck, no changes

## 2013-11-02 NOTE — Assessment & Plan Note (Signed)
Encouraged DASH diet and exercise

## 2013-11-02 NOTE — Assessment & Plan Note (Signed)
Mild simple carbs and increase exercise

## 2013-12-09 ENCOUNTER — Ambulatory Visit: Payer: Self-pay | Admitting: Pulmonary Disease

## 2013-12-24 ENCOUNTER — Telehealth: Payer: Self-pay | Admitting: Pulmonary Disease

## 2013-12-24 NOTE — Telephone Encounter (Signed)
I spoke with pt. Appt r/s. He will bring machine w/ cord to next OV. Nothing further needed

## 2013-12-24 NOTE — Telephone Encounter (Signed)
Per order 10/14/13: Note [facility] Check cpap machine and make sure in working order. Set on 10cm with NO ramp Need download off device in 2-3 weeks. ---  Spoke with pt. He reports his machine was not set on 10 cm until today. D/t his schedule he had hard time getting over to them. He has pending appt tomorrow and wants to know should he r/s this since pressure was not changed. Please advise [place] thanks

## 2013-12-24 NOTE — Telephone Encounter (Signed)
Would reschedule for 4 weeks from now, and have him bring his machine with him with power cord.

## 2013-12-25 ENCOUNTER — Ambulatory Visit: Payer: Self-pay | Admitting: Pulmonary Disease

## 2013-12-29 ENCOUNTER — Telehealth: Payer: Self-pay | Admitting: *Deleted

## 2013-12-29 ENCOUNTER — Telehealth: Payer: Self-pay | Admitting: Gastroenterology

## 2013-12-29 NOTE — Telephone Encounter (Signed)
[name] Foster note and recommendation is below. I called pharmacy back and spoke with [name] Foster. I explained to [name] Foster that I faxed back the request for prior auth with a note attached that patient needs to contact the office to make a follow up appointment with [name] Foster because he was suppose to follow up in one month from office visit in January. 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 20% of esophageal waves were nonpropulsive. I've increased his Reglan to 10 mg 3 times a day before meals and will continue twice a day Nexium. He is to see [name] Foster in followup in 4-6 weeks' time to see how he is doing symptomatically. Standard antireflux maneuvers reviewed. He appears stable from a cardiovascular standpoint but does have borderline hypertension. He is on antihypertensive medications.[name] Foster in pulmonary manages his obstructive sleep apnea.

## 2013-12-29 NOTE — Telephone Encounter (Signed)
Message copied by Carlyle Dolly on Mon Dec 29, 2013 12:47 PM ------      Message from: Kathline Magic      Created: Mon Dec 29, 2013 11:54 AM       Pt returned your call.  ------

## 2013-12-29 NOTE — Telephone Encounter (Signed)
Called patient back. Made patient follow up visit with Dr. Hilarie Fredrickson for 02-16-2014 I advised patient I left samples up front for him and Nexium can be purchased over the counter they come in 20 mg, he can take two to equal 40 mg and do twice daily if needed I advised patient that he was suppose to follow up with Dr. Hilarie Fredrickson in February or March I adivsed patient for further refills appointment must be kept Patient verbalized understanding

## 2014-01-07 ENCOUNTER — Ambulatory Visit: Payer: Self-pay | Admitting: Pulmonary Disease

## 2014-01-16 ENCOUNTER — Encounter (HOSPITAL_COMMUNITY): Payer: Self-pay

## 2014-01-26 ENCOUNTER — Ambulatory Visit: Payer: Self-pay | Admitting: Pulmonary Disease

## 2014-02-13 ENCOUNTER — Encounter: Payer: Self-pay | Admitting: Internal Medicine

## 2014-02-13 ENCOUNTER — Ambulatory Visit (HOSPITAL_COMMUNITY)
Admission: RE | Admit: 2014-02-13 | Discharge: 2014-02-13 | Disposition: A | Discharge status: Home / Self Care | Payer: BC Managed Care – PPO | Source: Ambulatory Visit | Attending: Internal Medicine | Admitting: Internal Medicine

## 2014-02-13 VITALS — BP 142/72 | HR 73 | Wt 254.8 lb

## 2014-02-13 DIAGNOSIS — E785 Hyperlipidemia, unspecified: Secondary | ICD-10-CM

## 2014-02-13 DIAGNOSIS — R072 Precordial pain: Secondary | ICD-10-CM

## 2014-02-13 DIAGNOSIS — I509 Heart failure, unspecified: Secondary | ICD-10-CM | POA: Insufficient documentation

## 2014-02-13 DIAGNOSIS — I5032 Chronic diastolic (congestive) heart failure: Secondary | ICD-10-CM | POA: Insufficient documentation

## 2014-02-13 DIAGNOSIS — E782 Mixed hyperlipidemia: Secondary | ICD-10-CM | POA: Insufficient documentation

## 2014-02-13 DIAGNOSIS — G4733 Obstructive sleep apnea (adult) (pediatric): Secondary | ICD-10-CM | POA: Insufficient documentation

## 2014-02-13 DIAGNOSIS — I10 Essential (primary) hypertension: Secondary | ICD-10-CM

## 2014-02-13 NOTE — Progress Notes (Signed)
Patient ID: [name] Foster, male   DOB: [birth year], 48 y.o.   MRN: [id number]  PCP: Penni Homans, MD  HPI:  [name] Foster is 48 y/o male (husband of [place] Beckom) with HTN, HL, OSA here for f/u on chest pain and cardiac risk factors. He has been found to have GERD with hypomotile esophagus and lower esophageal sphincter incompetence.    2011 ECHO EF 27% grade 2 diastolic dysfunction. Also had normal ETT.  11/13: Cardiac CT. Normal cors. Ca++ score = 0.   Had EGD and RUQ u/s in 12/13. Both normal except for fatty liver.   Since last appointment, has had stable pattern of chest pain.  It occurs at night when lying in bed and after meals.  This has been going on for a long time.  No exertional chest pain.  He is followed by GI for severe GERD and esophageal dysmotility.  BP is mildly elevated but he did not take his medicine yet today.  SBP typically in the 120s.   Labs (2/15): LDL 115, HDL 42, K 3.7, creatinine 0.93  ECG: NSR, normal  ROS: All systems negative except as listed in HPI, PMH and Problem List.  Past Medical History  Diagnosis Date  . Mixed hyperlipidemia   . Hypertension   . Bone spur     left foot  . OSA (obstructive sleep apnea)     s/p UPPP  . Murmur   . Stomach ulcer     from PCP  . Erectile dysfunction 01/15/2013  . Esophageal reflux 01/15/2013  . Hyperglycemia 07/13/2013  . Hiatal hernia     Current Outpatient Prescriptions  Medication Sig Dispense Refill  . acetaminophen (TYLENOL) 500 MG tablet Take 1 tablet (500 mg total) by mouth every 6 (six) hours as needed for pain.  60 tablet  0  . amLODipine (NORVASC) 10 MG tablet Take 1 tablet (10 mg total) by mouth daily.  90 tablet  1  . aspirin EC 81 MG tablet Take 81 mg by mouth daily.      [name] atorvastatin (LIPITOR) 20 MG tablet Take 2 tablets (40 mg total) by mouth daily.  180 tablet  1  . cyclobenzaprine (FLEXERIL) 10 MG tablet Take 1 tablet (10 mg total) by mouth 3 (three) times daily as needed for muscle spasms.  30  tablet  0  . ibuprofen (ADVIL,MOTRIN) 600 MG tablet Take 1 tablet (600 mg total) by mouth 3 (three) times daily as needed.  90 tablet  1  . lisinopril-hydrochlorothiazide (PRINZIDE,ZESTORETIC) 20-12.5 MG per tablet TAKE 1 TABLET BY MOUTH DAILY. *DUE FOR FOLLOW-UP OFFICE VISIT*  90 tablet  3  . metoCLOPramide (REGLAN) 5 MG tablet Take 1 tablet (5 mg total) by mouth 4 (four) times daily -  before meals and at bedtime.  120 tablet  3  . NEXIUM 40 MG capsule Take 1 capsule (40 mg total) by mouth 2 (two) times daily.  60 capsule  3  . promethazine (PHENERGAN) 25 MG tablet Take 1 tablet (25 mg total) by mouth every 8 (eight) hours as needed for nausea or vomiting.  20 tablet  1  . sucralfate (CARAFATE) 1 GM/10ML suspension Take 10 mLs (1 g total) by mouth 4 (four) times daily.  420 mL  3  . tadalafil (CIALIS) 5 MG tablet Take 1 tablet (5 mg total) by mouth daily as needed for erectile dysfunction.  30 tablet  5   No current facility-administered medications for this encounter.   PHYSICAL EXAM: Filed  Vitals:   02/13/14 0911  BP: 142/72  Pulse: 73   General:  Well appearing. No resp difficulty HEENT: normal Neck: supple. JVP flat. Carotids 2+ bilaterally; no bruits. No lymphadenopathy or thryomegaly appreciated. Cor: PMI normal. Regular rate & rhythm. No rubs, gallops or murmur Lungs: clear Abdomen: soft, obese nontender, nondistended.  No bruits or masses. Good bowel sounds. Extremities: no cyanosis, clubbing, rash, edema Neuro: alert & orientedx3, cranial nerves grossly intact. Moves all 4 extremities w/o difficulty. Affect pleasant.  ASSESSMENT & PLAN: 1. Chest pain: Patient had normal coronary CT in 11/13.  I suspect that his chronic chest pain pattern is due to GERD.  He has severe GERD with esophageal dysmotility and is followed closely by GI.  He is on Nexium.  2. OSA: Patient has a hard time with CPAP, he is going to schedule a followup with pulmonary.  3. HTN: BP is mildly elevated but  he has not taken his BP med.  At home, SBP 120s when he checks.  4. Hyperlipidemia: Work on diet/exercise.  I will get a [employer] profile in 8/15.    Larey Dresser 02/13/2014

## 2014-02-13 NOTE — Patient Instructions (Signed)
Fasting labs in August to check cholesterol   We will contact you in 1 year to schedule your next appointment.

## 2014-02-14 NOTE — Addendum Note (Signed)
Encounter addended by: Georga Kaufmann, CCT on: 02/14/2014  9:56 AM<BR>     Documentation filed: Charges VN

## 2014-02-16 ENCOUNTER — Ambulatory Visit (INDEPENDENT_AMBULATORY_CARE_PROVIDER_SITE_OTHER): Payer: BC Managed Care – PPO | Admitting: Internal Medicine

## 2014-02-16 ENCOUNTER — Encounter: Payer: Self-pay | Admitting: Internal Medicine

## 2014-02-16 ENCOUNTER — Ambulatory Visit (INDEPENDENT_AMBULATORY_CARE_PROVIDER_SITE_OTHER)
Admission: RE | Admit: 2014-02-16 | Discharge: 2014-02-16 | Disposition: A | Discharge status: Home / Self Care | Payer: BC Managed Care – PPO | Source: Ambulatory Visit | Attending: Internal Medicine | Admitting: Internal Medicine

## 2014-02-16 VITALS — BP 126/72 | HR 80 | Ht 72.0 in | Wt 256.0 lb

## 2014-02-16 DIAGNOSIS — K219 Gastro-esophageal reflux disease without esophagitis: Secondary | ICD-10-CM

## 2014-02-16 DIAGNOSIS — R0789 Other chest pain: Secondary | ICD-10-CM

## 2014-02-16 DIAGNOSIS — R079 Chest pain, unspecified: Secondary | ICD-10-CM

## 2014-02-16 MED ORDER — DEXLANSOPRAZOLE 60 MG PO CPDR: 60.0000 mg | DELAYED_RELEASE_CAPSULE | Freq: Every day | ORAL | Status: DC

## 2014-02-16 MED ORDER — SUCRALFATE 1 GM/10ML PO SUSP: 1.0000 g | Freq: Four times a day (QID) | ORAL | Status: DC

## 2014-02-16 NOTE — Progress Notes (Signed)
Subjective:    Patient ID: [name] Foster, male    DOB: 1965/11/20, 48 y.o.   MRN: [id number]  HPI [name] Foster is a 48 yo male, under the care of Dr. Sharlett Iles before his retirement, with a past medical history of GERD and atypical chest pain, hypertension, hyperlipidemia, OSA, and erectile dysfunction who is seen in followup. He is alone today and this is our first visit. He was followed by Dr. Sharlett Iles for GERD symptoms and atypical chest pain. He had an upper endoscopy in December 2014 which showed a small hiatal hernia but was otherwise normal. Random esophageal biopsies were performed and normal without evidence of eosinophilic esophagitis. His PPI was increased to twice daily with Nexium 40 mg, and he was on a prescription of liquid Carafate and also Reglan. He was using Reglan at midday and at bedtime. He is no longer using Carafate or Reglan. He reports that the liquid Carafate seemed to help but he didn't find much benefit from Reglan. He had an esophageal manometry which showed mucosal breaks but no evidence of hypertensive contraction with normal LES relaxation.  Today he reports he continues to have substernal chest pain which is worse at night and also after eating. He reports food goes down normally and thus denies dysphagia or odynophagia. The pain in his chest can be burning but also throbbing. He reports that it is currently bothering him. He is using Nexium 40 mg twice daily and still occasionally has some breakthrough heartburn and regurgitation. The pain has been significant for the last 4-5 months. He reports cardiology evaluation which was unremarkable. He reports he has sleep apnea but has not been wearing his CPAP recently. He thinks his CPAP may have caused bronchitis.  He denies abdominal pain. No nausea or vomiting. Early satiety. No change in bowel habit. He denies blood in his stool or melena. Denies diarrhea or constipation  Review of Systems As per history of present  illness, otherwise negative  Current Medications, Allergies, Past Medical History, Past Surgical History, Family History and Social History were reviewed in [employer] record.     Objective:   Physical Exam BP 126/72  Pulse 80  Ht 6' (1.829 m)  Wt 256 lb (116.121 kg)  BMI 34.71 kg/m2 Constitutional: Well-developed and well-nourished. No distress. HEENT: Normocephalic and atraumatic. Oropharynx is clear and moist. No oropharyngeal exudate. Conjunctivae are normal.  No scleral icterus. Neck: Neck supple. Trachea midline. Cardiovascular: Normal rate, regular rhythm and intact distal pulses. No M/R/G Pulmonary/chest: Effort normal and breath sounds normal. No wheezing, rales or rhonchi. No pain to firm pressure over ribs or sternum anteriorly Abdominal: Soft, nontender, nondistended. Bowel sounds active throughout. There are no masses palpable. No hepatosplenomegaly. Extremities: no clubbing, cyanosis, or edema Lymphadenopathy: No cervical adenopathy noted. Neurological: Alert and oriented to person place and time. Skin: Skin is warm and dry. No rashes noted. Psychiatric: Normal mood and affect. Behavior is normal.  CBC    Component Value Date/Time   WBC 4.3 10/30/2013 1200   RBC 5.30 10/30/2013 1200   HGB 15.1 10/30/2013 1200   HCT 43.8 10/30/2013 1200   PLT 227 10/30/2013 1200   MCV 82.6 10/30/2013 1200   MCH 28.5 10/30/2013 1200   MCHC 34.5 10/30/2013 1200   RDW 14.6 10/30/2013 1200   LYMPHSABS 2.1 06/26/2012 1350   MONOABS 0.5 06/26/2012 1350   EOSABS 0.1 06/26/2012 1350   BASOSABS 0.0 06/26/2012 1350    CMP  Component Value Date/Time   NA 138 10/30/2013 1200   K 3.7 10/30/2013 1200   CL 103 10/30/2013 1200   CO2 26 10/30/2013 1200   GLUCOSE 89 10/30/2013 1200   BUN 14 10/30/2013 1200   CREATININE 0.93 10/30/2013 1200   CREATININE 0.93 06/26/2012 1350   CALCIUM 9.5 10/30/2013 1200   PROT 7.5 10/30/2013 1200   ALBUMIN 4.6 10/30/2013 1200   ALBUMIN 4.6  10/30/2013 1200   AST 29 10/30/2013 1200   ALT 46 10/30/2013 1200   ALKPHOS 71 10/30/2013 1200   BILITOT 0.4 10/30/2013 1200   GFRNONAA >90 06/26/2012 1350   GFRNONAA >60 01/04/2011 0911   GFRAA >90 06/26/2012 1350   GFRAA >60 01/04/2011 0911    EGD and esophageal manometry reviewed    Assessment & Plan:  48 yo male, under the care of Dr. Sharlett Iles before his retirement, with a past medical history of GERD and atypical chest pain, hypertension, hyperlipidemia, OSA, and erectile dysfunction who is seen in followup  1.  Atypical CP/GERD -- he has seen cardiology and had a cardiac CT which did not reveal CAD.  Dr. Haroldine Laws felt this was noncardiac CP. Certainly he could have uncontrolled GERD and esophageal dysmotility is often associated with uncontrolled reflux. With this in mind I will change his Nexium to Dexilant 60 mg daily. As we take this 30 minutes before breakfast daily. He can use famotidine or ranitidine over-the-counter per box instructions in the evening for breakthrough if necessary. I would like him to resume liquid Carafate before meals and at bedtime. We will discontinue Reglan. Chest x-ray today. He has followup with PCP and pulmonology upcoming.  No definite source for chest pain has been elucidated. We'll see if changing PPI benefits him. We could give a trial to a long-acting nitrate to see if this helps for possible esophageal spasm, though no spasm was found on his manometry. Will assess his response to Dexilant and followup with him in clinic in 8 weeks. Sooner if necessary. He is happy with this plan

## 2014-02-16 NOTE — Patient Instructions (Signed)
Please stop down to the basement and check in with Radiology for a chest X-ray  We have sent the following medications to your pharmacy for you to pick up at your convenience: Carafate, Dexilant; please take as directed  Discontinue taking Nexium

## 2014-02-23 ENCOUNTER — Ambulatory Visit: Payer: Self-pay | Admitting: Pulmonary Disease

## 2014-03-06 ENCOUNTER — Encounter: Payer: Self-pay | Admitting: Family Medicine

## 2014-03-06 ENCOUNTER — Ambulatory Visit (INDEPENDENT_AMBULATORY_CARE_PROVIDER_SITE_OTHER): Payer: BC Managed Care – PPO | Admitting: Family Medicine

## 2014-03-06 VITALS — BP 128/90 | HR 67 | Temp 98.2°F | Ht 72.0 in | Wt 253.1 lb

## 2014-03-06 DIAGNOSIS — E785 Hyperlipidemia, unspecified: Secondary | ICD-10-CM

## 2014-03-06 DIAGNOSIS — E669 Obesity, unspecified: Secondary | ICD-10-CM

## 2014-03-06 DIAGNOSIS — K219 Gastro-esophageal reflux disease without esophagitis: Secondary | ICD-10-CM

## 2014-03-06 DIAGNOSIS — R072 Precordial pain: Secondary | ICD-10-CM

## 2014-03-06 DIAGNOSIS — R7309 Other abnormal glucose: Secondary | ICD-10-CM

## 2014-03-06 DIAGNOSIS — I10 Essential (primary) hypertension: Secondary | ICD-10-CM

## 2014-03-06 DIAGNOSIS — R739 Hyperglycemia, unspecified: Secondary | ICD-10-CM

## 2014-03-06 DIAGNOSIS — G4700 Insomnia, unspecified: Secondary | ICD-10-CM

## 2014-03-06 MED ORDER — ISOSORBIDE MONONITRATE ER 30 MG PO TB24: 30.0000 mg | ORAL_TABLET | Freq: Every day | ORAL | Status: DC

## 2014-03-06 MED ORDER — ATORVASTATIN CALCIUM 20 MG PO TABS: 40.0000 mg | ORAL_TABLET | Freq: Every day | ORAL | Status: DC

## 2014-03-06 MED ORDER — ZOLPIDEM TARTRATE 5 MG PO TABS: ORAL_TABLET | ORAL | Status: DC

## 2014-03-06 MED ORDER — DEXLANSOPRAZOLE 60 MG PO CPDR: 60.0000 mg | DELAYED_RELEASE_CAPSULE | Freq: Every day | ORAL | Status: DC

## 2014-03-06 MED ORDER — AMLODIPINE BESYLATE 10 MG PO TABS: 10.0000 mg | ORAL_TABLET | Freq: Every day | ORAL | Status: DC

## 2014-03-06 MED ORDER — LISINOPRIL-HYDROCHLOROTHIAZIDE 20-12.5 MG PO TABS: 1.0000 | ORAL_TABLET | Freq: Every day | ORAL | Status: DC

## 2014-03-06 NOTE — Patient Instructions (Signed)
If light headed, low blood pressure, etc then drop Amlodipine 10 mg tab to 1/2 daily and if still have symptoms stop  Hypertension Hypertension, commonly called high blood pressure, is when the force of blood pumping through your arteries is too strong. Your arteries are the blood vessels that carry blood from your heart throughout your body. A blood pressure reading consists of a higher number over a lower number, such as 110/72. The higher number (systolic) is the pressure inside your arteries when your heart pumps. The lower number (diastolic) is the pressure inside your arteries when your heart relaxes. Ideally you want your blood pressure below 120/80. Hypertension forces your heart to work harder to pump blood. Your arteries may become narrow or stiff. Having hypertension puts you at risk for heart disease, stroke, and other problems.  RISK FACTORS Some risk factors for high blood pressure are controllable. Others are not.  Risk factors you cannot control include:   Race. You may be at higher risk if you are African American.  Age. Risk increases with age.  Gender. Men are at higher risk than women before age 102 years. After age 38, women are at higher risk than men. Risk factors you can control include:  Not getting enough exercise or physical activity.  Being overweight.  Getting too much fat, sugar, calories, or salt in your diet.  Drinking too much alcohol. SIGNS AND SYMPTOMS Hypertension does not usually cause signs or symptoms. Extremely high blood pressure (hypertensive crisis) may cause headache, anxiety, shortness of breath, and nosebleed. DIAGNOSIS  To check if you have hypertension, your health care Ota Ebersole will measure your blood pressure while you are seated, with your arm held at the level of your heart. It should be measured at least twice using the same arm. Certain conditions can cause a difference in blood pressure between your right and left arms. A blood pressure  reading that is higher than normal on one occasion does not mean that you need treatment. If one blood pressure reading is high, ask your health care Chrishaun Sasso about having it checked again. TREATMENT  Treating high blood pressure includes making lifestyle changes and possibly taking medication. Living a healthy lifestyle can help lower high blood pressure. You may need to change some of your habits. Lifestyle changes may include:  Following the DASH diet. This diet is high in fruits, vegetables, and whole grains. It is low in salt, red meat, and added sugars.  Getting at least 2 1/2 hours of brisk physical activity every week.  Losing weight if necessary.  Not smoking.  Limiting alcoholic beverages.  Learning ways to reduce stress. If lifestyle changes are not enough to get your blood pressure under control, your health care Lillard Bailon may prescribe medicine. You may need to take more than one. Work closely with your health care Meeyah Ovitt to understand the risks and benefits. HOME CARE INSTRUCTIONS  Have your blood pressure rechecked as directed by your health care Norvella Loscalzo.   Only take medicine as directed by your health care Baillie Mohammad. Follow the directions carefully. Blood pressure medicines must be taken as prescribed. The medicine does not work as well when you skip doses. Skipping doses also puts you at risk for problems.   Do not smoke.   Monitor your blood pressure at home as directed by your health care Rachelanne Whidby. SEEK MEDICAL CARE IF:   You think you are having a reaction to medicines taken.  You have recurrent headaches or feel dizzy.  You have  swelling in your ankles.  You have trouble with your vision. SEEK IMMEDIATE MEDICAL CARE IF:  You develop a severe headache or confusion.  You have unusual weakness, numbness, or feel faint.  You have severe chest or abdominal pain.  You vomit repeatedly.  You have trouble breathing. MAKE SURE YOU:   Understand these  instructions.  Will watch your condition.  Will get help right away if you are not doing well or get worse. Document Released: 08/28/2005 Document Revised: 09/02/2013 Document Reviewed: 06/20/2013 [facility] Patient Information 2015 [place], [place]. This information is not intended to replace advice given to you by your health care Nyelle Wolfson. Make sure you discuss any questions you have with your health care Damonta Cossey.

## 2014-03-06 NOTE — Progress Notes (Signed)
Pre visit review using our clinic review tool, if applicable. No additional management support is needed unless otherwise documented below in the visit note. 

## 2014-03-08 ENCOUNTER — Encounter: Payer: Self-pay | Admitting: Family Medicine

## 2014-03-08 NOTE — Assessment & Plan Note (Signed)
Encouraged DASH diet, decrease po intake and increase exercise as tolerated. Needs 7-8 hours of sleep nightly. Avoid trans fats, eat small, frequent meals every 4-5 hours with lean proteins, complex carbs and healthy fats. Minimize simple carbs, GMO foods. 

## 2014-03-08 NOTE — Assessment & Plan Note (Addendum)
Avoid offending foods, start probiotics. Do not eat large meals in late evening and consider raising head of bed. Try Carafate or Mylanta, good response to Dexilant.

## 2014-03-08 NOTE — Progress Notes (Signed)
Patient ID: [name] Foster, male   DOB: 12/11/1965, 48 y.o.   MRN: [id number] [name] Foster [id number] 1965/10/26 03/08/2014      Progress Note-Follow Up  Subjective  Chief Complaint  Chief Complaint  Patient presents with  . Follow-up    4 month    HPI  Patient is a 48 year old male in today for routine medical care. He is in today for followup. He is doing somewhat better. He now technologist chest pain correlate with eating. Near burning squeezing. Gets relief with Gas-X and Carafate. Has been switched from Nexium to death 1 and his symptoms were occurring less often but still occur. No palpitations or shortness of breath and diaphoresis or recent illness. Denies CP/palp/SOB/HA/congestion/fevers/GI or GU c/o. Taking meds as prescribed  Past Medical History  Diagnosis Date  . Mixed hyperlipidemia   . Hypertension   . Bone spur     left foot  . OSA (obstructive sleep apnea)     s/p UPPP  . Murmur   . Stomach ulcer     from PCP  . Erectile dysfunction 01/15/2013  . Esophageal reflux 01/15/2013  . Hyperglycemia 07/13/2013  . Hiatal hernia     Past Surgical History  Procedure Laterality Date  . Tonsillectomy    . Uvulectomy    . Esophageal manometry N/A 09/01/2013    Procedure: ESOPHAGEAL MANOMETRY (EM);  Surgeon: Sable Feil, MD;  Location: [facility];  Service: Endoscopy;  Laterality: N/A;    Family History  Problem Relation Age of Onset  . Dementia Mother   . Diabetes Mother   . Hypertension Mother   . Heart failure Mother   . Hypertension      siblings  . Colon cancer Neg Hx     History   Social History  . Marital Status: Married    Spouse Name: N/A    Number of Children: N/A  . Years of Education: N/A   Occupational History  . Scientist, forensic    Social History Main Topics  . Smoking status: Never Smoker   . Smokeless tobacco: Never Used  . Alcohol Use: No  . Drug Use: No  . Sexual Activity: Yes   Other Topics Concern  . Not on file   Social  History Narrative   Tobacco Use - No.    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 acetaminophen (TYLENOL) 500 MG tablet Take 1 tablet (500 mg total) by mouth every 6 (six) hours as needed for pain.  60 tablet  0  . aspirin EC 81 MG tablet Take 81 mg by mouth daily.      [name] ibuprofen (ADVIL,MOTRIN) 600 MG tablet Take 1 tablet (600 mg total) by mouth 3 (three) times daily as needed.  90 tablet  1  . sucralfate (CARAFATE) 1 GM/10ML suspension Take 10 mLs (1 g total) by mouth 4 (four) times daily.  420 mL  1  . tadalafil (CIALIS) 5 MG tablet Take 1  tablet (5 mg total) by mouth daily as needed for erectile dysfunction.  30 tablet  5   No current facility-administered medications on file prior to visit.    No Known Allergies  Review of Systems  Review of Systems  Constitutional: Negative for fever and malaise/fatigue.  HENT: Negative for congestion.   Eyes: Negative for discharge.  Respiratory: Negative for shortness of breath.   Cardiovascular: Positive for chest pain. Negative for palpitations and leg swelling.  Gastrointestinal: Positive for heartburn. Negative for nausea, abdominal pain and diarrhea.  Genitourinary: Negative for dysuria.  Musculoskeletal: Negative for falls.  Skin: Negative for rash.  Neurological: Negative for loss of consciousness and headaches.  Endo/Heme/Allergies: Negative for polydipsia.  Psychiatric/Behavioral: Negative for depression and suicidal ideas. The patient is not nervous/anxious and does not have insomnia.     Objective  BP 128/90  Pulse 67  Temp(Src) 98.2 F (36.8 C) (Oral)  Ht 6' (1.829 m)   Wt 253 lb 1.3 oz (114.796 kg)  BMI 34.32 kg/m2  SpO2 97%  Physical Exam  Physical Exam  Constitutional: He is oriented to person, place, and time and well-developed, well-nourished, and in no distress. No distress.  HENT:  Head: Normocephalic and atraumatic.  Eyes: Conjunctivae are normal.  Neck: Neck supple. No thyromegaly present.  Cardiovascular: Normal rate, regular rhythm and normal heart sounds.   No murmur heard. Pulmonary/Chest: Effort normal and breath sounds normal. No respiratory distress.  Abdominal: He exhibits no distension and no mass. There is no tenderness.  Musculoskeletal: He exhibits no edema.  Neurological: He is alert and oriented to person, place, and time.  Skin: Skin is warm.  Psychiatric: Memory, affect and judgment normal.    Lab Results  Component Value Date   TSH 2.656 10/30/2013   Lab Results  Component Value Date   WBC 4.3 10/30/2013   HGB 15.1 10/30/2013   HCT 43.8 10/30/2013   MCV 82.6 10/30/2013   PLT 227 10/30/2013   Lab Results  Component Value Date   CREATININE 0.93 10/30/2013   BUN 14 10/30/2013   NA 138 10/30/2013   K 3.7 10/30/2013   CL 103 10/30/2013   CO2 26 10/30/2013   Lab Results  Component Value Date   ALT 46 10/30/2013   AST 29 10/30/2013   ALKPHOS 71 10/30/2013   BILITOT 0.4 10/30/2013   Lab Results  Component Value Date   CHOL 186 10/30/2013   Lab Results  Component Value Date   HDL 42 10/30/2013   Lab Results  Component Value Date   LDLCALC 115* 10/30/2013   Lab Results  Component Value Date   TRIG 143 10/30/2013   Lab Results  Component Value Date   CHOLHDL 4.4 10/30/2013     Assessment & Plan  HYPERTENSION, UNSPECIFIED Well controlled, no changes to meds. Encouraged heart healthy diet such as the DASH diet and exercise as tolerated.   HYPERLIPIDEMIA-MIXED Tolerating statin, encouraged heart healthy diet, avoid trans fats, minimize simple carbs and saturated fats. Increase exercise as  tolerated  OBESITY Encouraged DASH diet, decrease po intake and increase exercise as tolerated. Needs 7-8 hours of sleep nightly. Avoid trans fats, eat small, frequent meals every 4-5 hours with lean proteins, complex carbs and healthy fats. Minimize simple carbs, GMO foods.  Esophageal reflux Avoid offending foods, start probiotics. Do not eat large meals in late evening and consider raising head of bed. Try Carafate or Mylanta, good response to Dexilant.  Substernal chest pain Cardiac work up negative  and symptoms correlate with eating. Avoid offending foods

## 2014-03-08 NOTE — Assessment & Plan Note (Signed)
Cardiac work up negative and symptoms correlate with eating. Avoid offending foods

## 2014-03-08 NOTE — Assessment & Plan Note (Signed)
Tolerating statin, encouraged heart healthy diet, avoid trans fats, minimize simple carbs and saturated fats. Increase exercise as tolerated 

## 2014-03-08 NOTE — Assessment & Plan Note (Signed)
Well controlled, no changes to meds. Encouraged heart healthy diet such as the DASH diet and exercise as tolerated.  °

## 2014-03-20 ENCOUNTER — Encounter: Payer: Self-pay | Admitting: Pulmonary Disease

## 2014-03-20 ENCOUNTER — Telehealth: Payer: Self-pay | Admitting: Family Medicine

## 2014-03-20 ENCOUNTER — Ambulatory Visit (INDEPENDENT_AMBULATORY_CARE_PROVIDER_SITE_OTHER): Payer: BC Managed Care – PPO | Admitting: Pulmonary Disease

## 2014-03-20 ENCOUNTER — Telehealth: Payer: Self-pay | Admitting: Internal Medicine

## 2014-03-20 VITALS — BP 118/72 | HR 66 | Temp 98.2°F | Ht 72.0 in | Wt 258.0 lb

## 2014-03-20 DIAGNOSIS — K449 Diaphragmatic hernia without obstruction or gangrene: Secondary | ICD-10-CM

## 2014-03-20 DIAGNOSIS — G4733 Obstructive sleep apnea (adult) (pediatric): Secondary | ICD-10-CM

## 2014-03-20 DIAGNOSIS — K219 Gastro-esophageal reflux disease without esophagitis: Secondary | ICD-10-CM | POA: Insufficient documentation

## 2014-03-20 DIAGNOSIS — M341 CR(E)ST syndrome: Secondary | ICD-10-CM

## 2014-03-20 HISTORY — DX: Gastro-esophageal reflux disease without esophagitis: K21.9

## 2014-03-20 HISTORY — DX: Diaphragmatic hernia without obstruction or gangrene: K44.9

## 2014-03-20 NOTE — Telephone Encounter (Signed)
Prior [name] Foster has been faxed and pt is aware that it takes up to 48 hours for a decision

## 2014-03-20 NOTE — Telephone Encounter (Signed)
Opened in error

## 2014-03-20 NOTE — Progress Notes (Signed)
   Subjective:    Patient ID: [name] Foster, male    DOB: May 06, 1966, 48 y.o.   MRN: [id number]  HPI Patient comes in today for followup of his obstructive sleep apnea. He has been trying to wear CPAP as much as he can, but is having issues with a feeling of claustrophobia. He is currently on a fixed pressure of 10 cm of water, and his download shows this is adequately controlling his apnea for the time that he has used the device. He is having no issues with mask fit or leaking.   Review of Systems  Constitutional: Negative for fever and unexpected weight change.  HENT: Negative for congestion, dental problem, ear pain, nosebleeds, postnasal drip, rhinorrhea, sinus pressure, sneezing, sore throat and trouble swallowing.   Eyes: Negative for redness and itching.  Respiratory: Negative for cough, chest tightness, shortness of breath and wheezing.   Cardiovascular: Negative for palpitations and leg swelling.  Gastrointestinal: Negative for nausea and vomiting.  Genitourinary: Negative for dysuria.  Musculoskeletal: Negative for joint swelling.  Skin: Negative for rash.  Neurological: Negative for headaches.  Hematological: Does not bruise/bleed easily.  Psychiatric/Behavioral: Negative for dysphoric mood. The patient is not nervous/anxious.        Objective:   Physical Exam Overweight male in no acute distress Nose without purulence or discharge noted Neck without lymphadenopathy or thyromegaly No skin breakdown or pressure necrosis from the CPAP Lower extremities without edema, no cyanosis Alert and oriented, moves all 4 extremities.       Assessment & Plan:

## 2014-03-20 NOTE — Assessment & Plan Note (Signed)
The patient is struggling with CPAP usage at this time because of a feeling of claustrophobia. He is also having issues with humidity, but has not turned up the heat to try and improve this. At this point, I would like to try him on the auto setting to see if he can better tolerate CPAP. If he cannot, I would consider trying bilevel. I've also encouraged him to work aggressively on weight loss.

## 2014-03-20 NOTE — Patient Instructions (Signed)
Gradually turn up the heat on the humidifier until you feel your dryness is better. Will have your machine set on an automatic setting to see if this is more comfortable for you. If you continue to have issues with tolerance, let us know so that we can work with you on further adjustments. Work on weight loss followup with me again in 76mos.

## 2014-04-07 ENCOUNTER — Telehealth: Payer: Self-pay | Admitting: Internal Medicine

## 2014-04-07 MED ORDER — PANTOPRAZOLE SODIUM 40 MG PO TBEC: 40.0000 mg | DELAYED_RELEASE_TABLET | Freq: Every day | ORAL | Status: DC

## 2014-04-07 NOTE — Telephone Encounter (Signed)
I have spoken to [name] Foster. @ [employer] to get status update on [name] Foster's prior authorization request sent on 03/20/14. [name] Foster states that they never received a prior authorization request. I asked if I could do a request by phone and was told that they have no record of [name] Foster ever having tried any other PPI's other than Dexilant. I advised that [name] Foster has tried zantac, Nexium, Zegerid, and omeprazole. She states that [name] Foster must call their insurance plan to tell what medications they have tried in the past. The physician's office cannot do this. She states that pantoprazole would not need a prior authorization though so [name] Foster may try this. I have spoken to [name] Foster and advised him of the above information. He verbalizes understanding. I have also spoken to [name] Bogus, PA-C who states that [name] Foster may be switched to pantoprazole. [name] Foster is agreeable to this. Rx sent.

## 2014-04-08 NOTE — Telephone Encounter (Signed)
I received a call back from Ida stating that patient called to advise of medications he has tried in the past. She states that we will need to fill out a prior authorization with medications tried and failed and send it back in. I explained that Janett Billow with their company had just advised that patient was to call with this information and would not let me do a prior authorization. The staff member states that this is not the policy. I filled out a prior authorization form and got a response from [facility]. They have denied the medication since there is no history of therapeutic failure to lansoprazole, pantoprazole or rabeprazole (non certification #[id number]). I have already sent pantoprazole to the pharmacy to be filled in place of [facility].

## 2014-04-17 ENCOUNTER — Telehealth: Payer: Self-pay | Admitting: Family Medicine

## 2014-04-17 ENCOUNTER — Telehealth: Payer: Self-pay | Admitting: Internal Medicine

## 2014-04-17 NOTE — Telephone Encounter (Signed)
Patient states that he had a DOT physical done and that there was protein in his urine. I offered patient appointment with either Einar Pheasant or Melissa but he wants to see Dr. Charlett Blake. Scheduled appointment for 04/23/14 with Dr. Charlett Blake. Patient would like to know if this is something that he needs to come in sooner or can he wait to be seen next Thursday?

## 2014-04-17 NOTE — Telephone Encounter (Signed)
This can wait til next Thursday as long as he does not have any symptoms of illness (ie fever, sore throat, dysuria, etc)

## 2014-04-17 NOTE — Telephone Encounter (Signed)
Please advise 

## 2014-04-17 NOTE — Telephone Encounter (Signed)
Left message for patient to call back  

## 2014-04-17 NOTE — Telephone Encounter (Signed)
Please inform

## 2014-04-17 NOTE — Telephone Encounter (Signed)
Left message for patient to return my call.

## 2014-04-20 NOTE — Telephone Encounter (Signed)
Patient reports that generic protonix is not helping.  He is required to try pantoprazole, aciphex, and lansoprazole and fail before they will approve Dexilant.  He wants to talk with his insurance company again then he will call back and let us know what they have to say.  He will call back with the PPI he needs to try according to the insurance company

## 2014-04-22 NOTE — Telephone Encounter (Signed)
Left message for patient to return my call.

## 2014-04-23 ENCOUNTER — Ambulatory Visit: Payer: Self-pay | Admitting: Family Medicine

## 2014-04-23 DIAGNOSIS — Z0289 Encounter for other administrative examinations: Secondary | ICD-10-CM

## 2014-05-04 ENCOUNTER — Telehealth: Payer: Self-pay | Admitting: Internal Medicine

## 2014-05-04 NOTE — Telephone Encounter (Signed)
I have spoken to patient. He states that insurance company told him they would approve his Dexilant and will send the forms to me. I have reiterated our conversations from 04/07/14 and after. I also explained that at times, insurance companies tell the patient one thing and the doctors office another. I advised that I will work on a form.

## 2014-05-05 ENCOUNTER — Encounter: Payer: Self-pay | Admitting: Family

## 2014-05-05 ENCOUNTER — Ambulatory Visit (INDEPENDENT_AMBULATORY_CARE_PROVIDER_SITE_OTHER): Payer: BC Managed Care – PPO | Admitting: Family

## 2014-05-05 VITALS — BP 124/84 | HR 71 | Temp 98.3°F | Resp 16 | Ht 72.0 in | Wt 257.0 lb

## 2014-05-05 DIAGNOSIS — I10 Essential (primary) hypertension: Secondary | ICD-10-CM

## 2014-05-05 DIAGNOSIS — R809 Proteinuria, unspecified: Secondary | ICD-10-CM

## 2014-05-05 DIAGNOSIS — E785 Hyperlipidemia, unspecified: Secondary | ICD-10-CM

## 2014-05-05 DIAGNOSIS — R609 Edema, unspecified: Secondary | ICD-10-CM

## 2014-05-05 LAB — CBC WITH DIFFERENTIAL/PLATELET
BASOS PCT: 0 % (ref 0–1)
Basophils Absolute: 0 10*3/uL (ref 0.0–0.1)
Eosinophils Absolute: 0.1 10*3/uL (ref 0.0–0.7)
Eosinophils Relative: 2 % (ref 0–5)
HCT: 43.2 % (ref 39.0–52.0)
HEMOGLOBIN: 14.8 g/dL (ref 13.0–17.0)
LYMPHS PCT: 41 % (ref 12–46)
Lymphs Abs: 2.3 10*3/uL (ref 0.7–4.0)
MCH: 28.7 pg (ref 26.0–34.0)
MCHC: 34.3 g/dL (ref 30.0–36.0)
MCV: 83.7 fL (ref 78.0–100.0)
MONOS PCT: 8 % (ref 3–12)
Monocytes Absolute: 0.4 10*3/uL (ref 0.1–1.0)
NEUTROS ABS: 2.7 10*3/uL (ref 1.7–7.7)
NEUTROS PCT: 49 % (ref 43–77)
Platelets: 235 10*3/uL (ref 150–400)
RBC: 5.16 MIL/uL (ref 4.22–5.81)
RDW: 14 % (ref 11.5–15.5)
WBC: 5.6 10*3/uL (ref 4.0–10.5)

## 2014-05-05 NOTE — Progress Notes (Signed)
Subjective:    Patient ID: [name] Foster, male    DOB: 1965-12-27, 48 y.o.   MRN: [id number]  HPI  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   He also presents today with complaint of recent LE edema which is worse in the evening.  He denies associated CP or sob.  Swelling is improved in the morning.    Review of Systems See HPI  Past Medical History  Diagnosis Date  . Mixed hyperlipidemia   . Hypertension   . Bone spur     left foot  . OSA (obstructive sleep apnea)     s/p UPPP  . Murmur   . Stomach ulcer     from PCP  . Erectile dysfunction 01/15/2013  . Esophageal reflux 01/15/2013  . Hyperglycemia 07/13/2013  . Hiatal hernia     History   Social History  . Marital Status: Married    Spouse Name: N/A    Number of Children: N/A  . Years of Education: N/A   Occupational History  . Scientist, forensic    Social History Main Topics  . Smoking status: Never Smoker   . Smokeless tobacco: Never Used  . Alcohol Use: No  . Drug Use: No  . Sexual Activity: Yes   Other Topics Concern  . Not on file   Social History Narrative   Tobacco Use - No.    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 Tonsillectomy    . Uvulectomy    . Esophageal manometry N/A 09/01/2013    Procedure: ESOPHAGEAL MANOMETRY (EM);  Surgeon: Sable Feil, MD;  Location: [facility];  Service: Endoscopy;  Laterality: N/A;    Family History  Problem Relation Age of Onset  . Dementia Mother   . Diabetes Mother   . Hypertension Mother   . Heart failure Mother   .  Hypertension      siblings  . Colon cancer Neg Hx     No Known Allergies  Current Outpatient Prescriptions on File Prior to Visit  Medication Sig Dispense Refill  . acetaminophen (TYLENOL) 500 MG tablet Take 1 tablet (500 mg total) by mouth every 6 (six) hours as needed for pain.  60 tablet  0  . amLODipine (NORVASC) 10 MG tablet Take 1 tablet (10 mg total) by mouth daily.  90 tablet  2  . aspirin EC 81 MG tablet Take 81 mg by mouth daily.      [name] atorvastatin (LIPITOR) 20 MG tablet Take 2 tablets (40 mg total) by mouth daily.  180 tablet  2  . ibuprofen (ADVIL,MOTRIN) 600 MG tablet Take 1 tablet (600 mg total) by mouth 3 (three) times daily as needed.  90 tablet  1  . isosorbide mononitrate (IMDUR) 30 MG 24 hr tablet Take 1 tablet (30 mg total) by mouth daily.  30 tablet  2  . lisinopril-hydrochlorothiazide (  PRINZIDE,ZESTORETIC) 20-12.5 MG per tablet Take 1 tablet by mouth daily.  90 tablet  2  . pantoprazole (PROTONIX) 40 MG tablet Take 1 tablet (40 mg total) by mouth daily.  90 tablet  1  . tadalafil (CIALIS) 5 MG tablet Take 1 tablet (5 mg total) by mouth daily as needed for erectile dysfunction.  30 tablet  5  . zolpidem (AMBIEN) 5 MG tablet 1/2 to 2 tabs po qhs as needed for insomnia  15 tablet  1   No current facility-administered medications on file prior to visit.    BP 124/84  Pulse 71  Temp(Src) 98.3 F (36.8 C) (Oral)  Resp 16  Ht 6' (1.829 m)  Wt 257 lb (116.574 kg)  BMI 34.85 kg/m2  SpO2 97%       Objective:   Physical Exam  Constitutional: He is oriented to person, place, and time. He appears well-developed and well-nourished. No distress.  HENT:  Head: Normocephalic and atraumatic.  Cardiovascular: Normal rate and regular rhythm.   No murmur heard. Pulmonary/Chest: Effort normal and breath sounds normal. No respiratory distress. He has no wheezes. He has no rales. He exhibits no tenderness.  Musculoskeletal:  Trace bilateral LE edema  Neurological: He is  alert and oriented to person, place, and time.  Psychiatric: He has a normal mood and affect. His behavior is normal. Judgment and thought content normal.          Assessment & Plan:

## 2014-05-05 NOTE — Patient Instructions (Signed)
Please complete your lab work prior to leaving. Call if you develop worsening swelling, >5 pound weight gain in 1 week, or shortness of breath. Follow up in 3 months.

## 2014-05-05 NOTE — Progress Notes (Signed)
Pre visit review using our clinic review tool, if applicable. No additional management support is needed unless otherwise documented below in the visit note. 

## 2014-05-06 LAB — RENAL FUNCTION PANEL
Albumin: 4.6 g/dL (ref 3.5–5.2)
BUN: 13 mg/dL (ref 6–23)
CHLORIDE: 105 meq/L (ref 96–112)
CO2: 24 mEq/L (ref 19–32)
Calcium: 9.5 mg/dL (ref 8.4–10.5)
Creat: 1.08 mg/dL (ref 0.50–1.35)
Glucose, Bld: 106 mg/dL — ABNORMAL HIGH (ref 70–99)
PHOSPHORUS: 3.4 mg/dL (ref 2.3–4.6)
POTASSIUM: 3.8 meq/L (ref 3.5–5.3)
Sodium: 140 mEq/L (ref 135–145)

## 2014-05-06 LAB — URINALYSIS, ROUTINE W REFLEX MICROSCOPIC
BILIRUBIN URINE: NEGATIVE
GLUCOSE, UA: NEGATIVE mg/dL
Hgb urine dipstick: NEGATIVE
KETONES UR: NEGATIVE mg/dL
Leukocytes, UA: NEGATIVE
Nitrite: NEGATIVE
Protein, ur: NEGATIVE mg/dL
SPECIFIC GRAVITY, URINE: 1.025 (ref 1.005–1.030)
Urobilinogen, UA: 1 mg/dL (ref 0.0–1.0)
pH: 6.5 (ref 5.0–8.0)

## 2014-05-06 LAB — LIPID PANEL
Cholesterol: 171 mg/dL (ref 0–200)
HDL: 36 mg/dL — ABNORMAL LOW (ref >39)
LDL CALC: 101 mg/dL — AB (ref 0–99)
Total CHOL/HDL Ratio: 4.8 Ratio
Triglycerides: 169 mg/dL — ABNORMAL HIGH (ref <150)
VLDL: 34 mg/dL (ref 0–40)

## 2014-05-06 LAB — HEPATIC FUNCTION PANEL
ALBUMIN: 4.6 g/dL (ref 3.5–5.2)
ALT: 34 U/L (ref 0–53)
AST: 23 U/L (ref 0–37)
Alkaline Phosphatase: 71 U/L (ref 39–117)
Bilirubin, Direct: 0.1 mg/dL (ref 0.0–0.3)
Indirect Bilirubin: 0.3 mg/dL (ref 0.2–1.2)
TOTAL PROTEIN: 7.6 g/dL (ref 6.0–8.3)
Total Bilirubin: 0.4 mg/dL (ref 0.2–1.2)

## 2014-05-06 LAB — TSH: TSH: 1.467 u[IU]/mL (ref 0.350–4.500)

## 2014-05-07 DIAGNOSIS — R609 Edema, unspecified: Secondary | ICD-10-CM | POA: Insufficient documentation

## 2014-05-07 DIAGNOSIS — R809 Proteinuria, unspecified: Secondary | ICD-10-CM | POA: Insufficient documentation

## 2014-05-07 NOTE — Assessment & Plan Note (Signed)
Follow up urinalysis is negative for protein.

## 2014-05-07 NOTE — Assessment & Plan Note (Signed)
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 No clinical sign of volume overload otherwise.  Will try to avoid additional diuretics due to his occupation and lack of rest room opportunity.  He is advised to contact me if he develops shortness of breath or if swelling worsens. He is comfortable with this plan.

## 2014-05-07 NOTE — Telephone Encounter (Signed)
Patient's Dexilant was approved this time! Case #[id number]. (clinical information given: pt has tried and failed zantac, Nexium, Zegerid, omeprazole and pantoprazole for GERD). Medication is approved through 05/06/15.

## 2014-05-08 ENCOUNTER — Ambulatory Visit: Payer: Self-pay | Admitting: Family Medicine

## 2014-05-08 DIAGNOSIS — Z0289 Encounter for other administrative examinations: Secondary | ICD-10-CM

## 2014-05-12 ENCOUNTER — Telehealth: Payer: Self-pay | Admitting: *Deleted

## 2014-05-12 NOTE — Telephone Encounter (Signed)
Yes

## 2014-05-12 NOTE — Telephone Encounter (Signed)
Unable to add test as specimen was discarded today per CSR at [facility]. Can this be addressed at his follow up in 3 months?

## 2014-05-12 NOTE — Telephone Encounter (Signed)
Message copied by Ronny Flurry on Tue May 12, 2014  2:03 PM ------      Message from: O'SULLIVAN, MELISSA      Created: Mon May 11, 2014  8:22 AM       Pleas ask lab to add on A1C. ------

## 2014-05-13 ENCOUNTER — Encounter: Payer: Self-pay | Admitting: Family

## 2014-05-19 ENCOUNTER — Telehealth: Payer: Self-pay | Admitting: *Deleted

## 2014-05-19 NOTE — Telephone Encounter (Signed)
Pharmacy mail order called and stated they needed authorization on patients dexilant Explained to them that this medication was approved by D Rocky Morel in new script for the Dexilant 90 day supply Spoke with the pharmacist

## 2014-06-19 ENCOUNTER — Ambulatory Visit: Payer: Self-pay | Admitting: Family Medicine

## 2014-06-26 ENCOUNTER — Encounter: Payer: Self-pay | Admitting: Family Medicine

## 2014-06-26 ENCOUNTER — Ambulatory Visit (INDEPENDENT_AMBULATORY_CARE_PROVIDER_SITE_OTHER): Payer: BC Managed Care – PPO | Admitting: Family Medicine

## 2014-06-26 VITALS — BP 150/95 | HR 59 | Temp 98.3°F | Ht 72.0 in | Wt 257.6 lb

## 2014-06-26 DIAGNOSIS — Z23 Encounter for immunization: Secondary | ICD-10-CM

## 2014-06-26 DIAGNOSIS — R3589 Other polyuria: Secondary | ICD-10-CM

## 2014-06-26 DIAGNOSIS — R739 Hyperglycemia, unspecified: Secondary | ICD-10-CM

## 2014-06-26 DIAGNOSIS — E785 Hyperlipidemia, unspecified: Secondary | ICD-10-CM

## 2014-06-26 DIAGNOSIS — K219 Gastro-esophageal reflux disease without esophagitis: Secondary | ICD-10-CM

## 2014-06-26 DIAGNOSIS — I10 Essential (primary) hypertension: Secondary | ICD-10-CM

## 2014-06-26 DIAGNOSIS — G4733 Obstructive sleep apnea (adult) (pediatric): Secondary | ICD-10-CM

## 2014-06-26 DIAGNOSIS — R358 Other polyuria: Secondary | ICD-10-CM

## 2014-06-26 DIAGNOSIS — R609 Edema, unspecified: Secondary | ICD-10-CM

## 2014-06-26 DIAGNOSIS — E669 Obesity, unspecified: Secondary | ICD-10-CM

## 2014-06-26 LAB — HEMOGLOBIN A1C
HEMOGLOBIN A1C: 6.3 % — AB (ref <5.7)
MEAN PLASMA GLUCOSE: 134 mg/dL — AB (ref <117)

## 2014-06-26 MED ORDER — FLUTICASONE PROPIONATE 50 MCG/ACT NA SUSP: 2.0000 | Freq: Every day | NASAL | Status: DC

## 2014-06-26 MED ORDER — LISINOPRIL-HYDROCHLOROTHIAZIDE 20-12.5 MG PO TABS: 1.0000 | ORAL_TABLET | Freq: Two times a day (BID) | ORAL | Status: DC

## 2014-06-26 MED ORDER — FUROSEMIDE 20 MG PO TABS: 20.0000 mg | ORAL_TABLET | Freq: Every day | ORAL | Status: DC | PRN

## 2014-06-26 MED ORDER — DEXLANSOPRAZOLE 60 MG PO CPDR: 60.0000 mg | DELAYED_RELEASE_CAPSULE | Freq: Every day | ORAL | Status: DC

## 2014-06-26 NOTE — Patient Instructions (Addendum)
mylanta for reflux, chest pain  Tums at bedtime Gastroesophageal Reflux Disease, Adult Gastroesophageal reflux disease (GERD) happens when acid from your stomach flows up into the esophagus. When acid comes in contact with the esophagus, the acid causes soreness (inflammation) in the esophagus. Over time, GERD may create small holes (ulcers) in the lining of the esophagus. CAUSES   Increased body weight. This puts pressure on the stomach, making acid rise from the stomach into the esophagus.  Smoking. This increases acid production in the stomach.  Drinking alcohol. This causes decreased pressure in the lower esophageal sphincter (valve or ring of muscle between the esophagus and stomach), allowing acid from the stomach into the esophagus.  Late evening meals and a full stomach. This increases pressure and acid production in the stomach.  A malformed lower esophageal sphincter. Sometimes, no cause is found. SYMPTOMS   Burning pain in the lower part of the mid-chest behind the breastbone and in the mid-stomach area. This may occur twice a week or more often.  Trouble swallowing.  Sore throat.  Dry cough.  Asthma-like symptoms including chest tightness, shortness of breath, or wheezing. DIAGNOSIS  Your caregiver may be able to diagnose GERD based on your symptoms. In some cases, X-rays and other tests may be done to check for complications or to check the condition of your stomach and esophagus. TREATMENT  Your caregiver may recommend over-the-counter or prescription medicines to help decrease acid production. Ask your caregiver before starting or adding any new medicines.  HOME CARE INSTRUCTIONS   Change the factors that you can control. Ask your caregiver for guidance concerning weight loss, quitting smoking, and alcohol consumption.  Avoid foods and drinks that make your symptoms worse, such as:  Caffeine or alcoholic drinks.  Chocolate.  Peppermint or mint  flavorings.  Garlic and onions.  Spicy foods.  Citrus fruits, such as oranges, lemons, or limes.  Tomato-based foods such as sauce, chili, salsa, and pizza.  Fried and fatty foods.  Avoid lying down for the 3 hours prior to your bedtime or prior to taking a nap.  Eat small, frequent meals instead of large meals.  Wear loose-fitting clothing. Do not wear anything tight around your waist that causes pressure on your stomach.  Raise the head of your bed 6 to 8 inches with wood blocks to help you sleep. Extra pillows will not help.  Only take over-the-counter or prescription medicines for pain, discomfort, or fever as directed by your caregiver.  Do not take aspirin, ibuprofen, or other nonsteroidal anti-inflammatory drugs (NSAIDs). SEEK IMMEDIATE MEDICAL CARE IF:   You have pain in your arms, neck, jaw, teeth, or back.  Your pain increases or changes in intensity or duration.  You develop nausea, vomiting, or sweating (diaphoresis).  You develop shortness of breath, or you faint.  Your vomit is green, yellow, black, or looks like coffee grounds or blood.  Your stool is red, bloody, or black. These symptoms could be signs of other problems, such as heart disease, gastric bleeding, or esophageal bleeding. MAKE SURE YOU:   Understand these instructions.  Will watch your condition.  Will get help right away if you are not doing well or get worse. Document Released: 06/07/2005 Document Revised: 11/20/2011 Document Reviewed: 03/17/2011 [facility] Patient Information 2015 [place], [place]. This information is not intended to replace advice given to you by your health care provider. Make sure you discuss any questions you have with your health care provider.

## 2014-06-26 NOTE — Progress Notes (Signed)
Pre visit review using our clinic review tool, if applicable. No additional management support is needed unless otherwise documented below in the visit note. 

## 2014-06-27 LAB — URINALYSIS
BILIRUBIN URINE: NEGATIVE
Glucose, UA: NEGATIVE mg/dL
Hgb urine dipstick: NEGATIVE
Ketones, ur: NEGATIVE mg/dL
Leukocytes, UA: NEGATIVE
NITRITE: NEGATIVE
PROTEIN: NEGATIVE mg/dL
Specific Gravity, Urine: 1.021 (ref 1.005–1.030)
UROBILINOGEN UA: 0.2 mg/dL (ref 0.0–1.0)
pH: 6 (ref 5.0–8.0)

## 2014-06-28 ENCOUNTER — Encounter: Payer: Self-pay | Admitting: Family Medicine

## 2014-06-28 NOTE — Progress Notes (Signed)
Patient ID: [name] Foster, male   DOB: 1965/10/03, 48 y.o.   MRN: [id number] [name] Foster [id number] 15-Mar-1966 06/28/2014      Progress Note-Follow Up  Subjective  Chief Complaint  Chief Complaint  Patient presents with  . Follow-up  . Injections    flu and tdap    HPI  Patient is a 48 year old male in today for routine medical care. . Agrees to flu shot and she A. Does continue to have some occasional atypical chest pain or heartburn and describes a sharp pain which resolved spontaneously. There is no associated shortness of breath, palpitations, anxiety and diaphoresis. Does acknowledge some recent polyuria but denies dysuria. Denies CP/palp/SOB/HA/congestion/ or GU c/o. Taking meds as prescribed  Past Medical History  Diagnosis Date  . Mixed hyperlipidemia   . Hypertension   . Bone spur     left foot  . OSA (obstructive sleep apnea)     s/p UPPP  . Murmur   . Stomach ulcer     from PCP  . Erectile dysfunction 01/15/2013  . Esophageal reflux 01/15/2013  . Hyperglycemia 07/13/2013  . Hiatal hernia     Past Surgical History  Procedure Laterality Date  . Tonsillectomy    . Uvulectomy    . Esophageal manometry N/A 09/01/2013    Procedure: ESOPHAGEAL MANOMETRY (EM);  Surgeon: Sable Feil, MD;  Location: [facility];  Service: Endoscopy;  Laterality: N/A;    Family History  Problem Relation Age of Onset  . Dementia Mother   . Diabetes Mother   . Hypertension Mother   . Heart failure Mother   . Hypertension      siblings  . Colon cancer Neg Hx     History   Social History  . Marital Status: Married    Spouse Name: N/A    Number of Children: N/A  . Years of Education: N/A   Occupational History  . Scientist, forensic    Social History Main Topics  . Smoking status: Never Smoker   . Smokeless tobacco: Never Used  . Alcohol Use: No  . Drug Use: No  . Sexual Activity: Yes   Other Topics Concern  . Not on file   Social History Narrative   Tobacco Use -  No.    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 acetaminophen (TYLENOL) 500 MG tablet Take 1 tablet (500 mg total) by mouth every 6 (six) hours as needed for pain.  60 tablet  0  . aspirin EC 81 MG tablet Take 81 mg by mouth daily.      [name] atorvastatin (LIPITOR) 20 MG tablet Take 2 tablets (40 mg total) by mouth daily.  180 tablet  2  . ibuprofen (ADVIL,MOTRIN) 600 MG tablet Take 1 tablet (600 mg total) by mouth 3 (three) times daily as needed.  90 tablet  1  . isosorbide mononitrate (IMDUR) 30 MG 24 hr tablet Take 1 tablet (30 mg total) by mouth daily.  30 tablet  2  . tadalafil (CIALIS) 5 MG tablet Take 1 tablet (5 mg total) by mouth daily as needed for erectile dysfunction.  30 tablet  5  . zolpidem (AMBIEN) 5 MG tablet 1/2 to 2 tabs po qhs as needed for insomnia  15 tablet  1   No current facility-administered medications on file prior to visit.    No Known Allergies  Review of Systems  Review of Systems  Constitutional: Negative for fever and malaise/fatigue.  HENT: Negative for congestion.   Eyes: Negative for discharge.  Respiratory: Negative for shortness of breath.   Cardiovascular: Negative for chest pain, palpitations and leg swelling.  Gastrointestinal: Negative for nausea, abdominal pain and diarrhea.  Genitourinary: Negative for dysuria.  Musculoskeletal: Negative for falls.  Skin: Negative for rash.  Neurological: Negative for loss of consciousness and headaches.  Endo/Heme/Allergies: Negative for polydipsia.  Psychiatric/Behavioral: Negative for depression and suicidal ideas. The patient is not nervous/anxious and  does not have insomnia.     Objective  BP 150/95  Pulse 59  Temp(Src) 98.3 F (36.8 C) (Oral)  Ht 6' (1.829 m)  Wt 257 lb 9.6 oz (116.847 kg)  BMI 34.93 kg/m2  SpO2 98%  Physical Exam  Physical Exam  Constitutional: He is oriented to person, place, and time and well-developed, well-nourished, and in no distress. No distress.  HENT:  Head: Normocephalic and atraumatic.  Eyes: Conjunctivae are normal.  Neck: Neck supple. No thyromegaly present.  Cardiovascular: Normal rate, regular rhythm and normal heart sounds.   No murmur heard. Pulmonary/Chest: Effort normal and breath sounds normal. No respiratory distress.  Abdominal: He exhibits no distension and no mass. There is no tenderness.  Musculoskeletal: He exhibits no edema.  Neurological: He is alert and oriented to person, place, and time.  Skin: Skin is warm.  Psychiatric: Memory, affect and judgment normal.    Lab Results  Component Value Date   TSH 1.467 05/05/2014   Lab Results  Component Value Date   WBC 5.6 05/05/2014   HGB 14.8 05/05/2014   HCT 43.2 05/05/2014   MCV 83.7 05/05/2014   PLT 235 05/05/2014   Lab Results  Component Value Date   CREATININE 1.08 05/05/2014   BUN 13 05/05/2014   NA 140 05/05/2014   K 3.8 05/05/2014   CL 105 05/05/2014   CO2 24 05/05/2014   Lab Results  Component Value Date   ALT 34 05/05/2014   AST 23 05/05/2014   ALKPHOS 71 05/05/2014   BILITOT 0.4 05/05/2014   Lab Results  Component Value Date   CHOL 171 05/05/2014   Lab Results  Component Value Date   HDL 36* 05/05/2014   Lab Results  Component Value Date   LDLCALC 101* 05/05/2014   Lab Results  Component Value Date   TRIG 169* 05/05/2014   Lab Results  Component Value Date   CHOLHDL 4.8 05/05/2014     Assessment & Plan  Obesity Encouraged DASH diet, decrease po intake and increase exercise as tolerated. Needs 7-8 hours of sleep nightly. Avoid trans fats, eat small, frequent meals every 4-5 hours with lean proteins,  complex carbs and healthy fats. Minimize simple carbs, GMO foods.  Esophageal reflux Avoid offending foods, start probiotics. Do not eat large meals in late evening and consider raising head of bed.   Essential hypertension Poor control will increase lisinopril hct to bid and Encouraged heart healthy diet such as the DASH diet and exercise as tolerated.   Hyperlipidemia Tolerating  statin, encouraged heart healthy diet, avoid trans fats, minimize simple carbs and saturated fats. Increase exercise as tolerated  OSA (obstructive sleep apnea) Uses CPAP routinely and feels better with it

## 2014-06-28 NOTE — Assessment & Plan Note (Signed)
Uses CPAP routinely and feels better with it

## 2014-06-28 NOTE — Assessment & Plan Note (Signed)
Poor control will increase lisinopril hct to bid and Encouraged heart healthy diet such as the DASH diet and exercise as tolerated.

## 2014-06-28 NOTE — Assessment & Plan Note (Signed)
Tolerating statin, encouraged heart healthy diet, avoid trans fats, minimize simple carbs and saturated fats. Increase exercise as tolerated 

## 2014-06-28 NOTE — Assessment & Plan Note (Signed)
Avoid offending foods, start probiotics. Do not eat large meals in late evening and consider raising head of bed.  

## 2014-06-28 NOTE — Assessment & Plan Note (Signed)
Encouraged DASH diet, decrease po intake and increase exercise as tolerated. Needs 7-8 hours of sleep nightly. Avoid trans fats, eat small, frequent meals every 4-5 hours with lean proteins, complex carbs and healthy fats. Minimize simple carbs, GMO foods. 

## 2014-07-01 ENCOUNTER — Ambulatory Visit (HOSPITAL_BASED_OUTPATIENT_CLINIC_OR_DEPARTMENT_OTHER): Payer: Self-pay

## 2014-07-08 ENCOUNTER — Ambulatory Visit (HOSPITAL_BASED_OUTPATIENT_CLINIC_OR_DEPARTMENT_OTHER): Payer: Self-pay

## 2014-07-09 ENCOUNTER — Encounter (HOSPITAL_COMMUNITY): Payer: Self-pay

## 2014-07-20 ENCOUNTER — Ambulatory Visit: Payer: 59 | Admitting: Podiatry

## 2014-07-23 ENCOUNTER — Encounter (HOSPITAL_BASED_OUTPATIENT_CLINIC_OR_DEPARTMENT_OTHER): Payer: Self-pay | Admitting: *Deleted

## 2014-07-23 ENCOUNTER — Emergency Department (HOSPITAL_BASED_OUTPATIENT_CLINIC_OR_DEPARTMENT_OTHER): Payer: BC Managed Care – PPO

## 2014-07-23 ENCOUNTER — Emergency Department (HOSPITAL_BASED_OUTPATIENT_CLINIC_OR_DEPARTMENT_OTHER)
Admission: EM | Admit: 2014-07-23 | Discharge: 2014-07-24 | Disposition: A | Discharge status: Home / Self Care | Payer: BC Managed Care – PPO | Attending: Emergency Medicine | Admitting: Emergency Medicine

## 2014-07-23 DIAGNOSIS — Z79899 Other long term (current) drug therapy: Secondary | ICD-10-CM | POA: Insufficient documentation

## 2014-07-23 DIAGNOSIS — E782 Mixed hyperlipidemia: Secondary | ICD-10-CM | POA: Diagnosis not present

## 2014-07-23 DIAGNOSIS — Z7951 Long term (current) use of inhaled steroids: Secondary | ICD-10-CM | POA: Insufficient documentation

## 2014-07-23 DIAGNOSIS — K219 Gastro-esophageal reflux disease without esophagitis: Secondary | ICD-10-CM | POA: Diagnosis not present

## 2014-07-23 DIAGNOSIS — R011 Cardiac murmur, unspecified: Secondary | ICD-10-CM | POA: Insufficient documentation

## 2014-07-23 DIAGNOSIS — K5900 Constipation, unspecified: Secondary | ICD-10-CM | POA: Diagnosis not present

## 2014-07-23 DIAGNOSIS — K5904 Chronic idiopathic constipation: Secondary | ICD-10-CM

## 2014-07-23 DIAGNOSIS — Z791 Long term (current) use of non-steroidal anti-inflammatories (NSAID): Secondary | ICD-10-CM | POA: Insufficient documentation

## 2014-07-23 DIAGNOSIS — Z8669 Personal history of other diseases of the nervous system and sense organs: Secondary | ICD-10-CM | POA: Diagnosis not present

## 2014-07-23 DIAGNOSIS — K648 Other hemorrhoids: Secondary | ICD-10-CM | POA: Insufficient documentation

## 2014-07-23 DIAGNOSIS — K6289 Other specified diseases of anus and rectum: Secondary | ICD-10-CM | POA: Diagnosis present

## 2014-07-23 DIAGNOSIS — Z7982 Long term (current) use of aspirin: Secondary | ICD-10-CM | POA: Diagnosis not present

## 2014-07-23 DIAGNOSIS — R52 Pain, unspecified: Secondary | ICD-10-CM

## 2014-07-23 DIAGNOSIS — I10 Essential (primary) hypertension: Secondary | ICD-10-CM | POA: Insufficient documentation

## 2014-07-23 DIAGNOSIS — Z8739 Personal history of other diseases of the musculoskeletal system and connective tissue: Secondary | ICD-10-CM | POA: Insufficient documentation

## 2014-07-23 DIAGNOSIS — Z87448 Personal history of other diseases of urinary system: Secondary | ICD-10-CM | POA: Insufficient documentation

## 2014-07-23 LAB — URINALYSIS, ROUTINE W REFLEX MICROSCOPIC
Bilirubin Urine: NEGATIVE
GLUCOSE, UA: NEGATIVE mg/dL
Hgb urine dipstick: NEGATIVE
KETONES UR: NEGATIVE mg/dL
Leukocytes, UA: NEGATIVE
Nitrite: NEGATIVE
Protein, ur: 30 mg/dL — AB
Specific Gravity, Urine: 1.028 (ref 1.005–1.030)
Urobilinogen, UA: 1 mg/dL (ref 0.0–1.0)
pH: 6 (ref 5.0–8.0)

## 2014-07-23 LAB — URINE MICROSCOPIC-ADD ON

## 2014-07-23 MED ORDER — IBUPROFEN 800 MG PO TABS
800.0000 mg | ORAL_TABLET | Freq: Once | ORAL | Status: AC
  Administered 2014-07-23: 800 mg via ORAL
  Filled 2014-07-23: qty 1

## 2014-07-23 NOTE — ED Notes (Signed)
MD at bedside. 

## 2014-07-23 NOTE — ED Notes (Signed)
Pt c/o rectal and groin pain x 1 week

## 2014-07-23 NOTE — ED Provider Notes (Signed)
CSN: [id number]     Arrival date & time 07/23/14  2127 History   First MD Initiated Contact with Patient 07/23/14 2304     This chart was scribed for [name], MD by Forrestine Him, ED Scribe. This patient was seen in room MH02/MH02 and the patient's care was started 11:48 PM.   Chief Complaint  Patient presents with  . Rectal Pain   Patient is a 48 y.o. male presenting with groin pain. The history is provided by the patient. No language interpreter was used.  Groin Pain This is a recurrent problem. The current episode started more than 1 week ago. The problem occurs rarely. The problem has not changed since onset.Pertinent negatives include no chest pain, no abdominal pain, no headaches and no shortness of breath. Nothing aggravates the symptoms. Nothing relieves the symptoms. He has tried nothing for the symptoms. The treatment provided no relief.    HPI Comments: [name] Foster is a 48 y.o. male who presents to the Emergency Department complaining of constant, moderate rectal pain x 1 week that has progressively worsened. Pt also admits to a hernia to the groin onset several months it comes and goes. Discomfort from hernia is worsened with each bowel movements. Pt is not currently on any stool softeners or remedies to help with bowel movements. He denies any know hemorrhoids. No recent fever, chills, SOB, or CP.  No known allergies to medications. No other concerns his visit.  Past Medical History  Diagnosis Date  . Mixed hyperlipidemia   . Hypertension   . Bone spur     left foot  . OSA (obstructive sleep apnea)     s/p UPPP  . Murmur   . Stomach ulcer     from PCP  . Erectile dysfunction 01/15/2013  . Esophageal reflux 01/15/2013  . Hyperglycemia 07/13/2013  . Hiatal hernia    Past Surgical History  Procedure Laterality Date  . Tonsillectomy    . Uvulectomy    . Esophageal manometry N/A 09/01/2013    Procedure: ESOPHAGEAL MANOMETRY (EM);  Surgeon: Sable Feil,  MD;  Location: [facility];  Service: Endoscopy;  Laterality: N/A;   Family History  Problem Relation Age of Onset  . Dementia Mother   . Diabetes Mother   . Hypertension Mother   . Heart failure Mother   . Hypertension      siblings  . Colon cancer Neg Hx    History  Substance Use Topics  . Smoking status: Never Smoker   . Smokeless tobacco: Never Used  . Alcohol Use: No    Review of Systems  Constitutional: Negative for fever and chills.  Respiratory: Negative for shortness of breath.   Cardiovascular: Negative for chest pain.  Gastrointestinal: Positive for rectal pain. Negative for vomiting, abdominal pain, diarrhea, blood in stool, abdominal distention and anal bleeding.  Genitourinary: Negative for dysuria, urgency, frequency, flank pain, penile swelling, scrotal swelling and testicular pain.  Neurological: Negative for headaches.  All other systems reviewed and are negative.     Allergies  Review of patient's allergies indicates no known allergies.  Home Medications   Prior to Admission medications   Medication Sig Start Date End Date Taking? Authorizing Provider  acetaminophen (TYLENOL) 500 MG tablet Take 1 tablet (500 mg total) by mouth every 6 (six) hours as needed for pain. 11/16/12   Leandrew Koyanagi, MD  aspirin EC 81 MG tablet Take 81 mg by mouth daily.    Historical Provider, MD  atorvastatin (LIPITOR) 20 MG tablet Take 2 tablets (40 mg total) by mouth daily. 03/06/14   [name], MD  dexlansoprazole (DEXILANT) 60 MG capsule Take 1 capsule (60 mg total) by mouth daily. 06/26/14   [name], MD  fluticasone (FLONASE) 50 MCG/ACT nasal spray Place 2 sprays into both nostrils daily. 06/26/14   [name], MD  furosemide (LASIX) 20 MG tablet Take 1 tablet (20 mg total) by mouth daily as needed for fluid or edema. 06/26/14   [name], MD  ibuprofen (ADVIL,MOTRIN) 600 MG tablet Take 1 tablet (600 mg total) by mouth 3 (three) times daily as  needed. 08/11/13   [name], MD  isosorbide mononitrate (IMDUR) 30 MG 24 hr tablet Take 1 tablet (30 mg total) by mouth daily. 03/06/14   [name], MD  lisinopril-hydrochlorothiazide (PRINZIDE,ZESTORETIC) 20-12.5 MG per tablet Take 1 tablet by mouth 2 (two) times daily. 06/26/14   [name], MD  tadalafil (CIALIS) 5 MG tablet Take 1 tablet (5 mg total) by mouth daily as needed for erectile dysfunction. 01/14/13   [name], MD  zolpidem (AMBIEN) 5 MG tablet 1/2 to 2 tabs po qhs as needed for insomnia 03/06/14   [name], MD   Triage Vitals: BP 130/69 mmHg  Pulse 58  Temp(Src) 98.1 F (36.7 C) (Oral)  Resp 16  Ht 6' (1.829 m)  Wt 250 lb (113.399 kg)  BMI 33.90 kg/m2  SpO2 100%   Physical Exam  Constitutional: He is oriented to person, place, and time. He appears well-developed and well-nourished.  HENT:  Head: Normocephalic and atraumatic.  Mouth/Throat: Oropharynx is clear and moist.  Eyes: Conjunctivae and EOM are normal. Pupils are equal, round, and reactive to light.  Neck: Normal range of motion. Neck supple.  Cardiovascular: Normal rate, regular rhythm and normal heart sounds.   Pulmonary/Chest: Effort normal and breath sounds normal. He has no wheezes. He has no rales.  Abdominal: Soft. Bowel sounds are normal. He exhibits no mass. There is no tenderness. There is no rebound and no guarding.  Genitourinary: Prostate normal.   No hernia noted during examination Internal hemorrhoid noted, copious brown stool in vault soft  Musculoskeletal: Normal range of motion.  Neurological: He is alert and oriented to person, place, and time.  Skin: Skin is warm and dry.  Psychiatric: He has a normal mood and affect. His behavior is normal.  Nursing note and vitals reviewed.   ED Course  Procedures (including critical care time)  DIAGNOSTIC STUDIES: Oxygen Saturation is 100% on RA, Normal by my interpretation.    COORDINATION OF CARE: 11:24 PM-Discussed  treatment plan with pt at bedside and pt agreed to plan.     Labs Review Labs Reviewed  URINALYSIS, ROUTINE W REFLEX MICROSCOPIC - Abnormal; Notable for the following:    Protein, ur 30 (*)    All other components within normal limits  URINE MICROSCOPIC-ADD ON    Imaging Review No results found.   EKG Interpretation None      MDM   Final diagnoses:  None    No hernias at this time.  Suspect constipation is source of problems as copious stool in rectum and internal hemorrhoid no gross blood.  Follow up with PMD for ongoing care.  Miralax and colace and prn follow up with surgery for hernias  I personally performed the services described in this documentation, which was scribed in my presence. The recorded information has been  reviewed and is accurate.    Carlisle Beers, MD 07/24/14 [phone]

## 2014-07-24 ENCOUNTER — Encounter (HOSPITAL_BASED_OUTPATIENT_CLINIC_OR_DEPARTMENT_OTHER): Payer: Self-pay | Admitting: Emergency Medicine

## 2014-07-24 MED ORDER — POLYETHYLENE GLYCOL 3350 17 GM/SCOOP PO POWD: 1.0000 | Freq: Once | ORAL | Status: DC

## 2014-07-24 MED ORDER — DOCUSATE SODIUM 100 MG PO CAPS: 100.0000 mg | ORAL_CAPSULE | Freq: Two times a day (BID) | ORAL | Status: DC

## 2014-07-24 NOTE — ED Notes (Signed)
MD at bedside discussing test results and dispo plan of care. 

## 2014-08-13 ENCOUNTER — Other Ambulatory Visit: Payer: Self-pay

## 2014-08-13 DIAGNOSIS — I10 Essential (primary) hypertension: Secondary | ICD-10-CM

## 2014-08-13 MED ORDER — LISINOPRIL-HYDROCHLOROTHIAZIDE 20-12.5 MG PO TABS: 1.0000 | ORAL_TABLET | Freq: Two times a day (BID) | ORAL | Status: DC

## 2014-09-18 ENCOUNTER — Ambulatory Visit: Payer: Self-pay | Admitting: Pulmonary Disease

## 2014-11-26 ENCOUNTER — Telehealth: Payer: Self-pay | Admitting: Family Medicine

## 2014-11-26 ENCOUNTER — Telehealth: Payer: Self-pay | Admitting: Pulmonary Disease

## 2014-11-26 NOTE — Telephone Encounter (Signed)
disreguard message already taken care of.[name] Foster

## 2014-11-26 NOTE — Telephone Encounter (Signed)
He should stop his lisinoprilhct. We should send him in Maxzide 37.5/25 tabs and he should take one daily and then he should come in for a bp check on the new med in 2-4 weeks.

## 2014-11-26 NOTE — Telephone Encounter (Signed)
Caller name:Meinders, Daxson Relation to OO:JZBF Call back number:[phone] Pharmacy:  Reason for call: pt would like to know if he can be worked in to see Dr. Charlett Blake, states dr. Charlett Blake had made him aware to let her know if he experienced any dry cough with his bp meds rx pt states he is experiencing a dry cough and wanted to know if he should be seen or if dr. Charlett Blake wants him to discontinue the meds.

## 2014-11-27 MED ORDER — TRIAMTERENE-HCTZ 37.5-25 MG PO TABS: 1.0000 | ORAL_TABLET | Freq: Every day | ORAL | Status: DC

## 2014-11-27 NOTE — Telephone Encounter (Signed)
Patient informed and transferred to front desk to scheduled appt.

## 2014-11-27 NOTE — Telephone Encounter (Signed)
D/c lisinopril/hct.  Called the patient left msg. To call back

## 2015-01-29 ENCOUNTER — Ambulatory Visit: Payer: Self-pay | Admitting: Pulmonary Disease

## 2015-02-05 ENCOUNTER — Ambulatory Visit: Payer: Self-pay | Admitting: Pulmonary Disease

## 2015-03-17 ENCOUNTER — Telehealth: Payer: Self-pay | Admitting: Family Medicine

## 2015-03-17 NOTE — Telephone Encounter (Signed)
I can do any Wednesday at 4:30 that's my last CPE slot.  See if that will work for them.

## 2015-03-17 NOTE — Telephone Encounter (Signed)
Caller name: schronda Relationship to patient:wife Can be reached:[phone] Pharmacy:  Reason for call: he needs a cpe and Dr Charlett Blake is full for months.  Would you be willing to do a CPE on any Wednesday around 5

## 2015-03-22 ENCOUNTER — Ambulatory Visit (INDEPENDENT_AMBULATORY_CARE_PROVIDER_SITE_OTHER): Payer: BLUE CROSS/BLUE SHIELD | Admitting: Family Medicine

## 2015-03-22 ENCOUNTER — Encounter: Payer: Self-pay | Admitting: Family Medicine

## 2015-03-22 VITALS — BP 132/88 | HR 90 | Temp 98.3°F | Ht 72.0 in | Wt 256.1 lb

## 2015-03-22 DIAGNOSIS — I10 Essential (primary) hypertension: Secondary | ICD-10-CM | POA: Diagnosis not present

## 2015-03-22 DIAGNOSIS — R1013 Epigastric pain: Secondary | ICD-10-CM

## 2015-03-22 DIAGNOSIS — R739 Hyperglycemia, unspecified: Secondary | ICD-10-CM

## 2015-03-22 DIAGNOSIS — E785 Hyperlipidemia, unspecified: Secondary | ICD-10-CM

## 2015-03-22 DIAGNOSIS — M545 Low back pain: Secondary | ICD-10-CM | POA: Diagnosis not present

## 2015-03-22 DIAGNOSIS — R079 Chest pain, unspecified: Secondary | ICD-10-CM | POA: Diagnosis not present

## 2015-03-22 DIAGNOSIS — E782 Mixed hyperlipidemia: Secondary | ICD-10-CM

## 2015-03-22 DIAGNOSIS — K219 Gastro-esophageal reflux disease without esophagitis: Secondary | ICD-10-CM

## 2015-03-22 MED ORDER — HYOSCYAMINE SULFATE 0.125 MG SL SUBL: 0.1250 mg | SUBLINGUAL_TABLET | SUBLINGUAL | Status: DC | PRN

## 2015-03-22 NOTE — Patient Instructions (Addendum)
Consider pain patches such as Salon Pas or Aspercreme with Lidocaine    Caffeine and alcohol are diuretics, means you loose more water than you take in so you have to drink extra water plus 64 oz daily   Back Pain, Adult Low back pain is very common. About 1 in 5 people have back pain.The cause of low back pain is rarely dangerous. The pain often gets better over time.About half of people with a sudden onset of back pain feel better in just 2 weeks. About 8 in 10 people feel better by 6 weeks.  CAUSES Some common causes of back pain include:  Strain of the muscles or ligaments supporting the spine.  Wear and tear (degeneration) of the spinal discs.  Arthritis.  Direct injury to the back. DIAGNOSIS Most of the time, the direct cause of low back pain is not known.However, back pain can be treated effectively even when the exact cause of the pain is unknown.Answering your caregiver's questions about your overall health and symptoms is one of the most accurate ways to make sure the cause of your pain is not dangerous. If your caregiver needs more information, he or she may order lab work or imaging tests (X-rays or MRIs).However, even if imaging tests show changes in your back, this usually does not require surgery. HOME CARE INSTRUCTIONS For many people, back pain returns.Since low back pain is rarely dangerous, it is often a condition that people can learn to [facility] their own.   Remain active. It is stressful on the back to sit or stand in one place. Do not sit, drive, or stand in one place for more than 30 minutes at a time. Take short walks on level surfaces as soon as pain allows.Try to increase the length of time you walk each day.  Do not stay in bed.Resting more than 1 or 2 days can delay your recovery.  Do not avoid exercise or work.Your body is made to move.It is not dangerous to be active, even though your back may hurt.Your back will likely heal faster if you return  to being active before your pain is gone.  Pay attention to your body when you bend and lift. Many people have less discomfortwhen lifting if they bend their knees, keep the load close to their bodies,and avoid twisting. Often, the most comfortable positions are those that put less stress on your recovering back.  Find a comfortable position to sleep. Use a firm mattress and lie on your side with your knees slightly bent. If you lie on your back, put a pillow under your knees.  Only take over-the-counter or prescription medicines as directed by your caregiver. Over-the-counter medicines to reduce pain and inflammation are often the most helpful.Your caregiver may prescribe muscle relaxant drugs.These medicines help dull your pain so you can more quickly return to your normal activities and healthy exercise.  Put ice on the injured area.  Put ice in a plastic bag.  Place a towel between your skin and the bag.  Leave the ice on for 15-20 minutes, 03-04 times a day for the first 2 to 3 days. After that, ice and heat may be alternated to reduce pain and spasms.  Ask your caregiver about trying back exercises and gentle massage. This may be of some benefit.  Avoid feeling anxious or stressed.Stress increases muscle tension and can worsen back pain.It is important to recognize when you are anxious or stressed and learn ways to manage it.Exercise is a great  option. SEEK MEDICAL CARE IF:  You have pain that is not relieved with rest or medicine.  You have pain that does not improve in 1 week.  You have new symptoms.  You are generally not feeling well. SEEK IMMEDIATE MEDICAL CARE IF:   You have pain that radiates from your back into your legs.  You develop new bowel or bladder control problems.  You have unusual weakness or numbness in your arms or legs.  You develop nausea or vomiting.  You develop abdominal pain.  You feel faint. Document Released: 08/28/2005 Document  Revised: 02/27/2012 Document Reviewed: 12/30/2013 [facility] Patient Information 2015 [place], [place]. This information is not intended to replace advice given to you by your health care provider. Make sure you discuss any questions you have with your health care provider.

## 2015-03-22 NOTE — Progress Notes (Signed)
Pre visit review using our clinic review tool, if applicable. No additional management support is needed unless otherwise documented below in the visit note. 

## 2015-03-23 LAB — CBC WITH DIFFERENTIAL/PLATELET
BASOS PCT: 0.3 % (ref 0.0–3.0)
Basophils Absolute: 0 10*3/uL (ref 0.0–0.1)
EOS ABS: 0.1 10*3/uL (ref 0.0–0.7)
Eosinophils Relative: 1.9 % (ref 0.0–5.0)
HCT: 46.1 % (ref 39.0–52.0)
Hemoglobin: 15.5 g/dL (ref 13.0–17.0)
LYMPHS PCT: 39.8 % (ref 12.0–46.0)
Lymphs Abs: 2.3 10*3/uL (ref 0.7–4.0)
MCHC: 33.6 g/dL (ref 30.0–36.0)
MCV: 84.6 fl (ref 78.0–100.0)
Monocytes Absolute: 0.4 10*3/uL (ref 0.1–1.0)
Monocytes Relative: 6.1 % (ref 3.0–12.0)
NEUTROS ABS: 3 10*3/uL (ref 1.4–7.7)
NEUTROS PCT: 51.9 % (ref 43.0–77.0)
Platelets: 228 10*3/uL (ref 150.0–400.0)
RBC: 5.45 Mil/uL (ref 4.22–5.81)
RDW: 14 % (ref 11.5–15.5)
WBC: 5.9 10*3/uL (ref 4.0–10.5)

## 2015-03-23 LAB — LIPID PANEL
Cholesterol: 224 mg/dL — ABNORMAL HIGH (ref 0–200)
HDL: 38.3 mg/dL — ABNORMAL LOW (ref >39.00)
LDL Cholesterol: 151 mg/dL — ABNORMAL HIGH (ref 0–99)
NonHDL: 185.7
Total CHOL/HDL Ratio: 6
Triglycerides: 173 mg/dL — ABNORMAL HIGH (ref 0.0–149.0)
VLDL: 34.6 mg/dL (ref 0.0–40.0)

## 2015-03-23 LAB — COMPREHENSIVE METABOLIC PANEL
ALT: 37 U/L (ref 0–53)
AST: 30 U/L (ref 0–37)
Albumin: 4.5 g/dL (ref 3.5–5.2)
Alkaline Phosphatase: 67 U/L (ref 39–117)
BUN: 14 mg/dL (ref 6–23)
CALCIUM: 10 mg/dL (ref 8.4–10.5)
CO2: 28 mEq/L (ref 19–32)
Chloride: 102 mEq/L (ref 96–112)
Creatinine, Ser: 1.1 mg/dL (ref 0.40–1.50)
GFR: 92.11 mL/min (ref >60.00)
GLUCOSE: 83 mg/dL (ref 70–99)
Potassium: 4 mEq/L (ref 3.5–5.1)
Sodium: 138 mEq/L (ref 135–145)
Total Bilirubin: 0.3 mg/dL (ref 0.2–1.2)
Total Protein: 8 g/dL (ref 6.0–8.3)

## 2015-03-23 LAB — URINALYSIS
Bilirubin Urine: NEGATIVE
Hgb urine dipstick: NEGATIVE
KETONES UR: NEGATIVE
Leukocytes, UA: NEGATIVE
NITRITE: NEGATIVE
SPECIFIC GRAVITY, URINE: 1.015 (ref 1.000–1.030)
Total Protein, Urine: NEGATIVE
URINE GLUCOSE: NEGATIVE
Urobilinogen, UA: 0.2 (ref 0.0–1.0)
pH: 7 (ref 5.0–8.0)

## 2015-03-23 LAB — MAGNESIUM: Magnesium: 2 mg/dL (ref 1.5–2.5)

## 2015-03-23 LAB — TSH: TSH: 2.31 u[IU]/mL (ref 0.35–4.50)

## 2015-03-23 LAB — HEMOGLOBIN A1C: Hgb A1c MFr Bld: 6.2 % (ref 4.6–6.5)

## 2015-03-24 ENCOUNTER — Telehealth: Payer: Self-pay

## 2015-03-24 MED ORDER — PRAVASTATIN SODIUM 10 MG PO TABS: 10.0000 mg | ORAL_TABLET | Freq: Every day | ORAL | Status: DC

## 2015-03-24 MED ORDER — TRIAMTERENE-HCTZ 37.5-25 MG PO TABS: 1.0000 | ORAL_TABLET | Freq: Every day | ORAL | Status: DC

## 2015-03-24 NOTE — Telephone Encounter (Signed)
-----   Message from [name], MD sent at 03/23/2015 11:08 PM EDT ----- Notify all labs normal except lipids up, have him start Pravastatin 10 mg po qhs disp #30 with 4 rf and have him return in 3 months for visit and labs

## 2015-03-24 NOTE — Telephone Encounter (Signed)
Patient returned phone call. Best # [phone]

## 2015-03-24 NOTE — Telephone Encounter (Signed)
Pt notified and verbalized understanding. No questions or concerns at this time.

## 2015-03-24 NOTE — Telephone Encounter (Signed)
LMOVM

## 2015-03-25 ENCOUNTER — Other Ambulatory Visit: Payer: Self-pay | Admitting: Family Medicine

## 2015-03-25 MED ORDER — PRAVASTATIN SODIUM 10 MG PO TABS: 10.0000 mg | ORAL_TABLET | Freq: Every day | ORAL | Status: DC

## 2015-03-25 MED ORDER — TRIAMTERENE-HCTZ 37.5-25 MG PO TABS: 1.0000 | ORAL_TABLET | Freq: Every day | ORAL | Status: DC

## 2015-04-04 ENCOUNTER — Encounter: Payer: Self-pay | Admitting: Family Medicine

## 2015-04-04 NOTE — Assessment & Plan Note (Signed)
Tolerating statin, encouraged heart healthy diet, avoid trans fats, minimize simple carbs and saturated fats. Increase exercise as tolerated 

## 2015-04-04 NOTE — Assessment & Plan Note (Signed)
hgba1c acceptable, minimize simple carbs. Increase exercise as tolerated.  

## 2015-04-04 NOTE — Assessment & Plan Note (Signed)
Avoid offending foods, start probiotics. Do not eat large meals in late evening and consider raising head of bed.  

## 2015-04-04 NOTE — Progress Notes (Signed)
[name] Foster  [id number] 03-26-1966 04/04/2015      Progress Note-Follow Up  Subjective  Chief Complaint  Chief Complaint  Patient presents with  . Follow-up    HPI  Patient is a 49 y.o. male in today for routine medical care. Patient is in today for follow up and is noting low back pain with b/l lower extremity radicular symptoms at times. Pain can radiate to knees at times. No incontinence or injury. Notes some urinary frequency but no dysuria or hematuria. Denies CP/palp/SOB/HA/congestion/fevers/GI or c/o. Taking meds as prescribed  Past Medical History  Diagnosis Date  . Mixed hyperlipidemia   . Hypertension   . Bone spur     left foot  . OSA (obstructive sleep apnea)     s/p UPPP  . Murmur   . Stomach ulcer     from PCP  . Erectile dysfunction 01/15/2013  . Esophageal reflux 01/15/2013  . Hyperglycemia 07/13/2013  . Hiatal hernia     Past Surgical History  Procedure Laterality Date  . Tonsillectomy    . Uvulectomy    . Esophageal manometry N/A 09/01/2013    Procedure: ESOPHAGEAL MANOMETRY (EM);  Surgeon: Sable Feil, MD;  Location: [facility];  Service: Endoscopy;  Laterality: N/A;    Family History  Problem Relation Age of Onset  . Dementia Mother   . Diabetes Mother   . Hypertension Mother   . Heart failure Mother   . Hypertension      siblings  . Colon cancer Neg Hx     History   Social History  . Marital Status: Married    Spouse Name: N/A  . Number of Children: N/A  . Years of Education: N/A   Occupational History  . Scientist, forensic    Social History Main Topics  . Smoking status: Never Smoker   . Smokeless tobacco: Never Used  . Alcohol Use: No  . Drug Use: No  . Sexual Activity: Yes   Other Topics Concern  . Not on file   Social History Narrative   Tobacco Use - No.    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 acetaminophen (TYLENOL) 500 MG tablet Take 1 tablet (500 mg total) by mouth every 6 (six) hours as needed for pain. 60 tablet 0  . aspirin EC 81 MG tablet Take 81 mg by mouth daily.    [name] dexlansoprazole (DEXILANT) 60 MG capsule Take 1 capsule (60 mg total) by mouth daily. 30 capsule   . docusate sodium (COLACE) 100 MG capsule Take 1 capsule (100 mg total) by mouth every 12 (twelve) hours. 60 capsule 0  . fluticasone (FLONASE) 50 MCG/ACT nasal spray Place 2 sprays into both nostrils daily. (Patient not taking: Reported on 03/22/2015) 16 g 6  . ibuprofen (ADVIL,MOTRIN) 600 MG tablet Take 1 tablet (600 mg total) by mouth 3 (three) times daily as needed. (Patient not taking: Reported on 03/22/2015) 90 tablet 1  . isosorbide mononitrate (  IMDUR) 30 MG 24 hr tablet Take 1 tablet (30 mg total) by mouth daily. (Patient not taking: Reported on 03/22/2015) 30 tablet 2  . polyethylene glycol powder (MIRALAX) powder Take 255 g by mouth once. (Patient not taking: Reported on 03/22/2015) 255 g 0  . tadalafil (CIALIS) 5 MG tablet Take 1 tablet (5 mg total) by mouth daily as needed for erectile dysfunction. (Patient not taking: Reported on 03/22/2015) 30 tablet 5   No current facility-administered medications on file prior to visit.    No Known Allergies  Review of Systems  Review of Systems  Constitutional: Negative for fever and malaise/fatigue.  HENT: Negative for congestion.   Eyes: Negative for discharge.  Respiratory: Negative for shortness of breath.   Cardiovascular: Negative for chest pain, palpitations and leg swelling.  Gastrointestinal: Negative for nausea, abdominal pain and diarrhea.  Genitourinary: Negative for dysuria.  Musculoskeletal: Negative for  falls.  Skin: Negative for rash.  Neurological: Negative for loss of consciousness and headaches.  Endo/Heme/Allergies: Negative for polydipsia.  Psychiatric/Behavioral: Negative for depression and suicidal ideas. The patient is not nervous/anxious and does not have insomnia.     Objective  BP 132/88 mmHg  Pulse 90  Temp(Src) 98.3 F (36.8 C) (Oral)  Ht 6' (1.829 m)  Wt 256 lb 2 oz (116.178 kg)  BMI 34.73 kg/m2  SpO2 98%  Physical Exam  Physical Exam  Constitutional: He is oriented to person, place, and time and well-developed, well-nourished, and in no distress. No distress.  HENT:  Head: Normocephalic and atraumatic.  Eyes: Conjunctivae are normal.  Neck: Neck supple. No thyromegaly present.  Cardiovascular: Normal rate, regular rhythm and normal heart sounds.   No murmur heard. Pulmonary/Chest: Effort normal and breath sounds normal. No respiratory distress.  Abdominal: He exhibits no distension and no mass. There is no tenderness.  Musculoskeletal: He exhibits no edema.  Neurological: He is alert and oriented to person, place, and time.  Skin: Skin is warm.  Psychiatric: Memory, affect and judgment normal.    Lab Results  Component Value Date   TSH 2.31 03/23/2015   Lab Results  Component Value Date   WBC 5.9 03/23/2015   HGB 15.5 03/23/2015   HCT 46.1 03/23/2015   MCV 84.6 03/23/2015   PLT 228.0 03/23/2015   Lab Results  Component Value Date   CREATININE 1.10 03/23/2015   BUN 14 03/23/2015   NA 138 03/23/2015   K 4.0 03/23/2015   CL 102 03/23/2015   CO2 28 03/23/2015   Lab Results  Component Value Date   ALT 37 03/23/2015   AST 30 03/23/2015   ALKPHOS 67 03/23/2015   BILITOT 0.3 03/23/2015   Lab Results  Component Value Date   CHOL 224* 03/23/2015   Lab Results  Component Value Date   HDL 38.30* 03/23/2015   Lab Results  Component Value Date   LDLCALC 151* 03/23/2015   Lab Results  Component Value Date   TRIG 173.0* 03/23/2015   Lab  Results  Component Value Date   CHOLHDL 6 03/23/2015     Assessment & Plan  Hyperglycemia hgba1c acceptable, minimize simple carbs. Increase exercise as tolerated.   Esophageal reflux Avoid offending foods, start probiotics. Do not eat large meals in late evening and consider raising head of bed.   Back pain With radiculopathy, referred to physical therapy for further intervention. Encouraged moist heat and gentle stretching as tolerated. May try NSAIDs and prescription meds as directed and report if symptoms worsen or  seek immediate care  Hyperlipidemia Tolerating statin, encouraged heart healthy diet, avoid trans fats, minimize simple carbs and saturated fats. Increase exercise as tolerated  Essential hypertension Well controlled, no changes to meds. Encouraged heart healthy diet such as the DASH diet and exercise as tolerated.

## 2015-04-04 NOTE — Assessment & Plan Note (Signed)
With radiculopathy, referred to physical therapy for further intervention. Encouraged moist heat and gentle stretching as tolerated. May try NSAIDs and prescription meds as directed and report if symptoms worsen or seek immediate care

## 2015-04-04 NOTE — Assessment & Plan Note (Signed)
Well controlled, no changes to meds. Encouraged heart healthy diet such as the DASH diet and exercise as tolerated.  °

## 2015-05-10 ENCOUNTER — Encounter: Payer: Self-pay | Admitting: Internal Medicine

## 2015-05-10 ENCOUNTER — Ambulatory Visit (INDEPENDENT_AMBULATORY_CARE_PROVIDER_SITE_OTHER): Payer: BLUE CROSS/BLUE SHIELD | Admitting: Internal Medicine

## 2015-05-10 VITALS — BP 124/74 | HR 73 | Ht 72.0 in | Wt 259.8 lb

## 2015-05-10 DIAGNOSIS — G4733 Obstructive sleep apnea (adult) (pediatric): Secondary | ICD-10-CM

## 2015-05-10 DIAGNOSIS — I10 Essential (primary) hypertension: Secondary | ICD-10-CM

## 2015-05-10 NOTE — Assessment & Plan Note (Signed)
Discussed interaction of obstructive sleep apnea and hypertension

## 2015-05-10 NOTE — Patient Instructions (Signed)
Order - DME [facility]- We are sending copy of sleep study from 08/30/12- AHI 16.9/ hr   Dx OSA                                       Change CPAP to auto 8-15                                      Mask of choice, humidifier, supplies                                              Enroll [employer]   Please call as needed

## 2015-05-10 NOTE — Progress Notes (Signed)
Subjective:    Patient ID: [name] Foster, male    DOB: 06/01/1966, 49 y.o.   MRN: [id number]  HPI  03/20/14- Dr Gwenette Greet Patient comes in today for followup of his obstructive sleep apnea. He has been trying to wear CPAP as much as he can, but is having issues with a feeling of claustrophobia. He is currently on a fixed pressure of 10 cm of water, and his download shows this is adequately controlling his apnea for the time that he has used the device. He is having no issues with mask fit or leaking.  05/10/15- 89 yoM never smoker following for OSA,UPPP, complicated by obesity, HBP,  NPSG 08/2012:  AHI 17/hr, cpap to 9cm but some breakthru CPAP 10/ [facility] Former [place] patient; Pt states he wears CPAP occasionally-DME is [facility]. Pt states he to [facility] to get different mask but needed to have sleep study.  Pt also has noted trouble with breathing and chest tightness through out.  Had tonsils out, Had uvulopalatoplasty in 2003 He panics after about 3 hours his CPAP. Sleeps better in a recliner but not clear that this is positional. No known heart or lung problem. Told he had a heart murmur in the past.  ROS-see HPI   Negative unless "+" Constitutional:    weight loss, night sweats, fevers, chills, fatigue, lassitude. HEENT:    headaches, difficulty swallowing, tooth/dental problems, sore throat,       sneezing, itching, ear ache, nasal congestion, post nasal drip, snoring CV:    chest pain, orthopnea, PND, swelling in lower extremities, anasarca,                                                    dizziness, palpitations Resp:   shortness of breath with exertion or at rest.                productive cough,   non-productive cough, coughing up of blood.              change in color of mucus.  wheezing.   Skin:    rash or lesions. GI:  No-   heartburn, indigestion, abdominal pain, nausea, vomiting, diarrhea,                 change in bowel habits, loss of appetite GU: dysuria, change in color of  urine, no urgency or frequency.   flank pain. MS:   joint pain, stiffness, decreased range of motion, back pain. Neuro-     nothing unusual Psych:  change in mood or affect.  depression or anxiety.   memory loss.     Objective:  OBJ- Physical Exam General- Alert, Oriented, Affect-appropriate, Distress- none acute Skin- rash-none, lesions- none, excoriation- none Lymphadenopathy- none Head- atraumatic            Eyes- Gross vision intact, PERRLA, conjunctivae and secretions clear, + strabismus            Ears- Hearing, canals-normal            Nose- Clear, no-Septal dev, mucus, polyps, erosion, perforation             Throat-+UPPP , mucosa clear , drainage- none, tonsils- atrophic Neck- flexible , trachea midline, no stridor , thyroid nl, carotid no bruit Chest - symmetrical excursion , unlabored  Heart/CV- RRR , no murmur heard , no gallop  , no rub, nl s1 s2                           - JVD- none , edema- none, stasis changes- none, varices- none           Lung- clear to P&A, wheeze- none, cough- none , dullness-none, rub- none           Chest wall-  Abd-  Br/ Gen/ Rectal- Not done, not indicated Extrem- cyanosis- none, clubbing, none, atrophy- none, strength- nl Neuro- grossly intact to observation     Assessment & Plan:

## 2015-05-10 NOTE — Assessment & Plan Note (Signed)
Not sure if he is "panic" episodes with CPAP removal during the night reflect an underlying cardiopulmonary disorder or simply discomfort with mask pressure. Plan-new order to [facility], change to auto Pap 8-15 with mask of choice and download

## 2015-05-27 ENCOUNTER — Telehealth: Payer: Self-pay | Admitting: Family Medicine

## 2015-05-27 NOTE — Telephone Encounter (Signed)
Pt would like results mailed to his home address from 03/23/15.

## 2015-05-28 NOTE — Telephone Encounter (Signed)
Labs mailed

## 2015-06-04 ENCOUNTER — Encounter: Payer: Self-pay | Admitting: *Deleted

## 2015-06-24 ENCOUNTER — Encounter: Payer: Self-pay | Admitting: Family Medicine

## 2015-06-24 ENCOUNTER — Ambulatory Visit (INDEPENDENT_AMBULATORY_CARE_PROVIDER_SITE_OTHER): Payer: BLUE CROSS/BLUE SHIELD | Admitting: Family Medicine

## 2015-06-24 VITALS — BP 114/72 | HR 80 | Temp 98.0°F | Ht 72.0 in | Wt 259.1 lb

## 2015-06-24 DIAGNOSIS — R1013 Epigastric pain: Secondary | ICD-10-CM

## 2015-06-24 DIAGNOSIS — L309 Dermatitis, unspecified: Secondary | ICD-10-CM

## 2015-06-24 DIAGNOSIS — R079 Chest pain, unspecified: Secondary | ICD-10-CM | POA: Diagnosis not present

## 2015-06-24 DIAGNOSIS — R739 Hyperglycemia, unspecified: Secondary | ICD-10-CM | POA: Diagnosis not present

## 2015-06-24 DIAGNOSIS — K760 Fatty (change of) liver, not elsewhere classified: Secondary | ICD-10-CM

## 2015-06-24 DIAGNOSIS — E669 Obesity, unspecified: Secondary | ICD-10-CM

## 2015-06-24 DIAGNOSIS — G4733 Obstructive sleep apnea (adult) (pediatric): Secondary | ICD-10-CM

## 2015-06-24 DIAGNOSIS — K5900 Constipation, unspecified: Secondary | ICD-10-CM | POA: Diagnosis not present

## 2015-06-24 DIAGNOSIS — K449 Diaphragmatic hernia without obstruction or gangrene: Secondary | ICD-10-CM

## 2015-06-24 DIAGNOSIS — Z0000 Encounter for general adult medical examination without abnormal findings: Secondary | ICD-10-CM

## 2015-06-24 DIAGNOSIS — E785 Hyperlipidemia, unspecified: Secondary | ICD-10-CM

## 2015-06-24 DIAGNOSIS — K219 Gastro-esophageal reflux disease without esophagitis: Secondary | ICD-10-CM

## 2015-06-24 DIAGNOSIS — I10 Essential (primary) hypertension: Secondary | ICD-10-CM

## 2015-06-24 MED ORDER — RANITIDINE HCL 300 MG PO TABS: 300.0000 mg | ORAL_TABLET | Freq: Every day | ORAL | Status: DC

## 2015-06-24 MED ORDER — DEXLANSOPRAZOLE 60 MG PO CPDR
60.0000 mg | DELAYED_RELEASE_CAPSULE | Freq: Every day | ORAL | Status: DC
Start: 2015-06-24 — End: 2016-01-31

## 2015-06-24 NOTE — Patient Instructions (Signed)

## 2015-06-24 NOTE — Progress Notes (Signed)
Pre visit review using our clinic review tool, if applicable. No additional management support is needed unless otherwise documented below in the visit note. 

## 2015-06-25 ENCOUNTER — Encounter: Payer: Self-pay | Admitting: Family Medicine

## 2015-06-25 ENCOUNTER — Other Ambulatory Visit (HOSPITAL_BASED_OUTPATIENT_CLINIC_OR_DEPARTMENT_OTHER): Payer: Self-pay

## 2015-06-25 ENCOUNTER — Ambulatory Visit (HOSPITAL_BASED_OUTPATIENT_CLINIC_OR_DEPARTMENT_OTHER): Admission: RE | Admit: 2015-06-25 | Payer: BLUE CROSS/BLUE SHIELD | Source: Ambulatory Visit

## 2015-06-25 ENCOUNTER — Ambulatory Visit (HOSPITAL_BASED_OUTPATIENT_CLINIC_OR_DEPARTMENT_OTHER)
Admission: RE | Admit: 2015-06-25 | Discharge: 2015-06-25 | Disposition: A | Discharge status: Home / Self Care | Payer: BLUE CROSS/BLUE SHIELD | Source: Ambulatory Visit | Attending: Family Medicine | Admitting: Family Medicine

## 2015-06-25 DIAGNOSIS — R079 Chest pain, unspecified: Secondary | ICD-10-CM | POA: Insufficient documentation

## 2015-06-25 DIAGNOSIS — R1013 Epigastric pain: Secondary | ICD-10-CM

## 2015-06-25 DIAGNOSIS — K5900 Constipation, unspecified: Secondary | ICD-10-CM

## 2015-06-25 DIAGNOSIS — I10 Essential (primary) hypertension: Secondary | ICD-10-CM | POA: Diagnosis not present

## 2015-06-25 LAB — COMPREHENSIVE METABOLIC PANEL
ALT: 39 U/L (ref 0–53)
AST: 30 U/L (ref 0–37)
Albumin: 4.6 g/dL (ref 3.5–5.2)
Alkaline Phosphatase: 62 U/L (ref 39–117)
BUN: 20 mg/dL (ref 6–23)
CHLORIDE: 99 meq/L (ref 96–112)
CO2: 27 meq/L (ref 19–32)
CREATININE: 1.3 mg/dL (ref 0.40–1.50)
Calcium: 10.2 mg/dL (ref 8.4–10.5)
GFR: 75.88 mL/min (ref >60.00)
GLUCOSE: 80 mg/dL (ref 70–99)
Potassium: 3.8 mEq/L (ref 3.5–5.1)
SODIUM: 136 meq/L (ref 135–145)
Total Bilirubin: 0.5 mg/dL (ref 0.2–1.2)
Total Protein: 8.3 g/dL (ref 6.0–8.3)

## 2015-06-25 LAB — HIV ANTIBODY (ROUTINE TESTING W REFLEX): HIV 1&2 Ab, 4th Generation: NONREACTIVE

## 2015-06-25 LAB — CBC
HEMATOCRIT: 50.2 % (ref 39.0–52.0)
Hemoglobin: 16.9 g/dL (ref 13.0–17.0)
MCHC: 33.7 g/dL (ref 30.0–36.0)
MCV: 84.6 fl (ref 78.0–100.0)
Platelets: 247 10*3/uL (ref 150.0–400.0)
RBC: 5.93 Mil/uL — AB (ref 4.22–5.81)
RDW: 14.7 % (ref 11.5–15.5)
WBC: 5.9 10*3/uL (ref 4.0–10.5)

## 2015-06-25 LAB — LIPID PANEL
CHOL/HDL RATIO: 5
Cholesterol: 239 mg/dL — ABNORMAL HIGH (ref 0–200)
HDL: 43.5 mg/dL (ref >39.00)
LDL CALC: 168 mg/dL — AB (ref 0–99)
NONHDL: 195.29
Triglycerides: 135 mg/dL (ref 0.0–149.0)
VLDL: 27 mg/dL (ref 0.0–40.0)

## 2015-06-25 LAB — TSH: TSH: 2.08 u[IU]/mL (ref 0.35–4.50)

## 2015-06-25 LAB — H. PYLORI ANTIBODY, IGG: H Pylori IgG: NEGATIVE

## 2015-06-25 LAB — HEMOGLOBIN A1C: Hgb A1c MFr Bld: 6.2 % (ref 4.6–6.5)

## 2015-06-28 ENCOUNTER — Telehealth: Payer: Self-pay

## 2015-06-28 DIAGNOSIS — E785 Hyperlipidemia, unspecified: Secondary | ICD-10-CM

## 2015-06-28 DIAGNOSIS — R079 Chest pain, unspecified: Secondary | ICD-10-CM

## 2015-06-28 DIAGNOSIS — I10 Essential (primary) hypertension: Secondary | ICD-10-CM

## 2015-06-28 DIAGNOSIS — E669 Obesity, unspecified: Secondary | ICD-10-CM

## 2015-06-28 NOTE — Telephone Encounter (Signed)
-----   Message from [name], MD sent at 06/24/2015  9:14 PM EDT ----- Notify ekg shows no acute findings but after reviewing his symptoms and risk factor I think I would like to refer him to cardiology for further work up please confirm he is OK with this

## 2015-06-28 NOTE — Telephone Encounter (Signed)
Pt aware.  Order for referral placed.

## 2015-07-04 ENCOUNTER — Encounter: Payer: Self-pay | Admitting: Family Medicine

## 2015-07-04 DIAGNOSIS — K760 Fatty (change of) liver, not elsewhere classified: Secondary | ICD-10-CM

## 2015-07-04 HISTORY — DX: Fatty (change of) liver, not elsewhere classified: K76.0

## 2015-07-04 NOTE — Assessment & Plan Note (Signed)
Seen on ultrasound done for epigastric discomfort in October 2016. Encouraged to minimize simple carbs

## 2015-07-04 NOTE — Assessment & Plan Note (Signed)
Encouraged treatment which he has previously declined due to cost. He is advised that some of the change seen on his echocardiogram are c/w untreated sleep apnea.

## 2015-07-04 NOTE — Assessment & Plan Note (Signed)
Good response to Dexilant. Continue same. Avoid offending foods, start probiotics. Do not eat large meals in late evening and consider raising head of bed.

## 2015-07-04 NOTE — Assessment & Plan Note (Signed)
Encouraged to minimize sodium, elevated feet above heart. Stay active, keep weight down, compression hose prn

## 2015-07-04 NOTE — Assessment & Plan Note (Signed)
Well controlled, no changes to meds. Encouraged heart healthy diet such as the DASH diet and exercise as tolerated.  °

## 2015-07-04 NOTE — Assessment & Plan Note (Signed)
Tolerating statin, encouraged heart healthy diet, avoid trans fats, minimize simple carbs and saturated fats. Increase exercise as tolerated 

## 2015-07-04 NOTE — Assessment & Plan Note (Signed)
hgba1c acceptable, minimize simple carbs. Increase exercise as tolerated.  

## 2015-07-04 NOTE — Assessment & Plan Note (Signed)
Encouraged DASH diet, decrease po intake and increase exercise as tolerated. Needs 7-8 hours of sleep nightly. Avoid trans fats, eat small, frequent meals every 4-5 hours with lean proteins, complex carbs and healthy fats. Minimize simple carbs, GMO foods. 

## 2015-07-04 NOTE — Progress Notes (Signed)
Subjective:    Patient ID: [name] Foster, male    DOB: 12/18/1965, 49 y.o.   MRN: [id number]  Chief Complaint  Patient presents with  . Follow-up    HPI Patient is in today for for follow-up. He continues to complain of some trouble with pedal edema in both legs. Worse at the end of the day. Has had some mild trouble with heartburn but is generally well treated with excellent. Does occasionally note a cough as a result. Worse with liquid intake. No fevers or chills. Does have some epigastric discomfort at times. Worse with spicy foods. Has occasional skin rash no other new or acute complaints. Denies CP/palp/SOB/HA/congestion/fevers or GU c/o. Taking meds as prescribed  Past Medical History  Diagnosis Date  . Mixed hyperlipidemia   . Hypertension   . Bone spur     left foot  . OSA (obstructive sleep apnea)     s/p UPPP  . Murmur   . Stomach ulcer     from PCP  . Erectile dysfunction 01/15/2013  . Esophageal reflux 01/15/2013  . Hyperglycemia 07/13/2013  . Hiatal hernia   . GERD (gastroesophageal reflux disease)   . Hiatal hernia with gastroesophageal reflux 03/20/2014  . Fatty liver 07/04/2015    Past Surgical History  Procedure Laterality Date  . Tonsillectomy    . Uvulectomy    . Esophageal manometry N/A 09/01/2013    Procedure: ESOPHAGEAL MANOMETRY (EM);  Surgeon: Sable Feil, MD;  Location: [facility];  Service: Endoscopy;  Laterality: N/A;    Family History  Problem Relation Age of Onset  . Dementia Mother   . Diabetes Mother   . Hypertension Mother   . Heart failure Mother   . Hypertension      siblings  . Colon cancer Neg Hx     Social History   Social History  . Marital Status: Married    Spouse Name: N/A  . Number of Children: N/A  . Years of Education: N/A   Occupational History  . Scientist, forensic    Social History Main Topics  . Smoking status: Never Smoker   . Smokeless tobacco: Never Used  . Alcohol Use: No  . Drug Use: No  . Sexual  Activity: Yes   Other Topics Concern  . Not on file   Social History Narrative   Tobacco Use - No.    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 acetaminophen (TYLENOL) 500 MG tablet Take 1 tablet (500 mg total) by mouth every 6 (six) hours as needed for pain. 60 tablet 0  . aspirin EC 81 MG tablet Take 81 mg by mouth daily.    . fluticasone (FLONASE) 50 MCG/ACT nasal spray Place 2 sprays into both nostrils daily. 16 g 6  . hyoscyamine (LEVSIN SL) 0.125 MG SL tablet Place 1 tablet (0.125 mg total) under the tongue every 4 (four) hours as needed for cramping. 40 tablet 1  . ibuprofen (ADVIL,MOTRIN) 600 MG tablet Take 1 tablet (  600 mg total) by mouth 3 (three) times daily as needed. 90 tablet 1  . Multiple Vitamin (MULTIVITAMIN) tablet Take 1 tablet by mouth daily.    . pravastatin (PRAVACHOL) 10 MG tablet Take 1 tablet (10 mg total) by mouth at bedtime. 90 tablet 1  . tadalafil (CIALIS) 5 MG tablet Take 1 tablet (5 mg total) by mouth daily as needed for erectile dysfunction. 30 tablet 5  . triamterene-hydrochlorothiazide (MAXZIDE-25) 37.5-25 MG per tablet Take 1 tablet by mouth daily. 90 tablet 1  . dexlansoprazole (DEXILANT) 60 MG capsule Take 1 capsule (60 mg total) by mouth daily. 30 capsule    No facility-administered medications prior to visit.    No Known Allergies  Review of Systems  Constitutional: Negative for fever, chills and malaise/fatigue.  HENT: Negative for congestion and hearing loss.   Eyes: Negative for discharge.  Respiratory: Positive for cough. Negative for sputum production and shortness of breath.   Cardiovascular: Positive  for leg swelling. Negative for chest pain and palpitations.  Gastrointestinal: Positive for heartburn. Negative for nausea, vomiting, abdominal pain, diarrhea, constipation and blood in stool.  Genitourinary: Negative for dysuria, urgency, frequency and hematuria.  Musculoskeletal: Negative for myalgias, back pain and falls.  Skin: Positive for rash.  Neurological: Negative for dizziness, sensory change, loss of consciousness, weakness and headaches.  Endo/Heme/Allergies: Negative for environmental allergies. Does not bruise/bleed easily.  Psychiatric/Behavioral: Negative for depression and suicidal ideas. The patient is not nervous/anxious and does not have insomnia.        Objective:    Physical Exam  Constitutional: He is oriented to person, place, and time. He appears well-developed and well-nourished. No distress.  HENT:  Head: Normocephalic and atraumatic.  Eyes: Conjunctivae are normal.  Neck: Neck supple. No thyromegaly present.  Cardiovascular: Normal rate, regular rhythm and normal heart sounds.   No murmur heard. Pulmonary/Chest: Effort normal and breath sounds normal. No respiratory distress. He has no wheezes.  Abdominal: Soft. Bowel sounds are normal. He exhibits no mass. There is no tenderness.  Musculoskeletal: He exhibits no edema.  Lymphadenopathy:    He has no cervical adenopathy.  Neurological: He is alert and oriented to person, place, and time.  Skin: Skin is warm and dry.  Psychiatric: He has a normal mood and affect. His behavior is normal.    BP 114/72 mmHg  Pulse 80  Temp(Src) 98 F (36.7 C) (Oral)  Ht 6' (1.829 m)  Wt 259 lb 2 oz (117.538 kg)  BMI 35.14 kg/m2  SpO2 94% Wt Readings from Last 3 Encounters:  06/24/15 259 lb 2 oz (117.538 kg)  05/10/15 259 lb 12.8 oz (117.845 kg)  03/22/15 256 lb 2 oz (116.178 kg)     Lab Results  Component Value Date   WBC 5.9 06/24/2015   HGB 16.9 06/24/2015   HCT 50.2 06/24/2015   PLT 247.0 06/24/2015    GLUCOSE 80 06/24/2015   CHOL 239* 06/24/2015   TRIG 135.0 06/24/2015   HDL 43.50 06/24/2015   LDLCALC 168* 06/24/2015   ALT 39 06/24/2015   AST 30 06/24/2015   NA 136 06/24/2015   K 3.8 06/24/2015   CL 99 06/24/2015   CREATININE 1.30 06/24/2015   BUN 20 06/24/2015   CO2 27 06/24/2015   TSH 2.08 06/24/2015   PSA 2.42 02/28/2012   HGBA1C 6.2 06/24/2015    Lab Results  Component Value Date   TSH 2.08 06/24/2015   Lab Results  Component Value Date   WBC 5.9  06/24/2015   HGB 16.9 06/24/2015   HCT 50.2 06/24/2015   MCV 84.6 06/24/2015   PLT 247.0 06/24/2015   Lab Results  Component Value Date   NA 136 06/24/2015   K 3.8 06/24/2015   CO2 27 06/24/2015   GLUCOSE 80 06/24/2015   BUN 20 06/24/2015   CREATININE 1.30 06/24/2015   BILITOT 0.5 06/24/2015   ALKPHOS 62 06/24/2015   AST 30 06/24/2015   ALT 39 06/24/2015   PROT 8.3 06/24/2015   ALBUMIN 4.6 06/24/2015   CALCIUM 10.2 06/24/2015   GFR 75.88 06/24/2015   Lab Results  Component Value Date   CHOL 239* 06/24/2015   Lab Results  Component Value Date   HDL 43.50 06/24/2015   Lab Results  Component Value Date   LDLCALC 168* 06/24/2015   Lab Results  Component Value Date   TRIG 135.0 06/24/2015   Lab Results  Component Value Date   CHOLHDL 5 06/24/2015   Lab Results  Component Value Date   HGBA1C 6.2 06/24/2015       Assessment & Plan:   Problem List Items Addressed This Visit    Preventative health care   Relevant Orders   HIV antibody (with reflex) (Completed)   OSA (obstructive sleep apnea)    Encouraged treatment which he has previously declined due to cost. He is advised that some of the change seen on his echocardiogram are c/w untreated sleep apnea.      Obesity    Encouraged DASH diet, decrease po intake and increase exercise as tolerated. Needs 7-8 hours of sleep nightly. Avoid trans fats, eat small, frequent meals every 4-5 hours with lean proteins, complex carbs and healthy fats.  Minimize simple carbs, GMO foods.      Hyperlipidemia    Tolerating statin, encouraged heart healthy diet, avoid trans fats, minimize simple carbs and saturated fats. Increase exercise as tolerated      Hyperglycemia    hgba1c acceptable, minimize simple carbs. Increase exercise as tolerated.       Relevant Orders   Lipid panel (Completed)   Comprehensive metabolic panel (Completed)   CBC (Completed)   TSH (Completed)   H. pylori antibody, IgG (Completed)   Hiatal hernia with gastroesophageal reflux    Good response to Dexilant. Continue same. Avoid offending foods, start probiotics. Do not eat large meals in late evening and consider raising head of bed.       Relevant Medications   dexlansoprazole (DEXILANT) 60 MG capsule   ranitidine (ZANTAC) 300 MG tablet   Fatty liver    Seen on ultrasound done for epigastric discomfort in October 2016. Encouraged to minimize simple carbs      Essential hypertension    Well controlled, no changes to meds. Encouraged heart healthy diet such as the DASH diet and exercise as tolerated.        Other Visit Diagnoses    Abdominal pain, epigastric    -  Primary    Relevant Medications    ranitidine (ZANTAC) 300 MG tablet    Other Relevant Orders    Echocardiogram    US Abdomen Complete (Completed)    EKG 12-Lead (Completed)    DG Chest 2 View (Completed)    Hemoglobin A1c (Completed)    Lipid panel (Completed)    Comprehensive metabolic panel (Completed)    CBC (Completed)    TSH (Completed)    H. pylori antibody, IgG (Completed)    Chest pain, unspecified chest pain type  Relevant Medications    ranitidine (ZANTAC) 300 MG tablet    Other Relevant Orders    Echocardiogram    US Abdomen Complete (Completed)    EKG 12-Lead (Completed)    DG Chest 2 View (Completed)    Hemoglobin A1c (Completed)    Lipid panel (Completed)    Comprehensive metabolic panel (Completed)    CBC (Completed)    TSH (Completed)    H. pylori  antibody, IgG (Completed)    Constipation, unspecified constipation type        Relevant Medications    ranitidine (ZANTAC) 300 MG tablet    Other Relevant Orders    Echocardiogram    US Abdomen Complete (Completed)    EKG 12-Lead (Completed)    DG Chest 2 View (Completed)    Hemoglobin A1c (Completed)    Lipid panel (Completed)    Comprehensive metabolic panel (Completed)    CBC (Completed)    TSH (Completed)    H. pylori antibody, IgG (Completed)    Eczema        Relevant Orders    Lipid panel (Completed)    Comprehensive metabolic panel (Completed)    CBC (Completed)    TSH (Completed)    H. pylori antibody, IgG (Completed)       I am having Mr. Veilleux start on ranitidine. I am also having him maintain his acetaminophen, tadalafil, ibuprofen, aspirin EC, fluticasone, hyoscyamine, pravastatin, triamterene-hydrochlorothiazide, multivitamin, and dexlansoprazole.  Meds ordered this encounter  Medications  . dexlansoprazole (DEXILANT) 60 MG capsule    Sig: Take 1 capsule (60 mg total) by mouth daily.    Dispense:  90 capsule    Refill:  2  . ranitidine (ZANTAC) 300 MG tablet    Sig: Take 1 tablet (300 mg total) by mouth at bedtime.    Dispense:  90 tablet    Refill:  1     Penni Homans, MD

## 2015-07-06 ENCOUNTER — Ambulatory Visit (INDEPENDENT_AMBULATORY_CARE_PROVIDER_SITE_OTHER): Payer: BLUE CROSS/BLUE SHIELD | Admitting: Internal Medicine

## 2015-07-06 ENCOUNTER — Encounter: Payer: Self-pay | Admitting: Internal Medicine

## 2015-07-06 VITALS — BP 146/88 | HR 68 | Ht 71.0 in | Wt 262.4 lb

## 2015-07-06 DIAGNOSIS — Z1211 Encounter for screening for malignant neoplasm of colon: Secondary | ICD-10-CM | POA: Diagnosis not present

## 2015-07-06 DIAGNOSIS — R0789 Other chest pain: Secondary | ICD-10-CM

## 2015-07-06 DIAGNOSIS — K219 Gastro-esophageal reflux disease without esophagitis: Secondary | ICD-10-CM

## 2015-07-06 MED ORDER — NA SULFATE-K SULFATE-MG SULF 17.5-3.13-1.6 GM/177ML PO SOLN: ORAL | Status: DC

## 2015-07-06 NOTE — Patient Instructions (Signed)
You have been scheduled for an endoscopy and colonoscopy. Please follow the written instructions given to you at your visit today. Please pick up your prep supplies at the pharmacy within the next 1-3 days. If you use inhalers (even only as needed), please bring them with you on the day of your procedure. Your physician has requested that you go to [email or web address] and enter the access code given to you at your visit today. This web site gives a general overview about your procedure. However, you should still follow specific instructions given to you by our office regarding your preparation for the procedure.  Please continue Dexilant every morning and ranitidine every evening.  Please purchase the following medications over the counter and take as directed: Gaviscon-Use as per box instructions.

## 2015-07-06 NOTE — Progress Notes (Signed)
   Subjective:    Patient ID: [name] Foster, male    DOB: 1966/01/21, 49 y.o.   MRN: [id number]  HPI [name] Foster is a 49 year old male with history of GERD, atypical chest pain, hypotensive esophageal contractions with incomplete bolus clearance on previous manometry, hypertension, hyperlipidemia and OSA who seen for follow-up. He was seen in June 2015 and is PPI was changed to Mead. This worked considerably better for him than Nexium. He has had breakthrough heartburn in the evening and also burning atypical type chest pain. He was started recently on ranitidine 300 mg by primary care. He's only taken this 1 day. He is using over-the-counter Mylanta which works quickly to relieve his chest discomfort. He also has a pending cardiology workup though this was unremarkable several years ago. Pain does not seem to be exertional. Worse in the evening and after eating. Denies dysphagia. Denies nausea and vomiting. Bowel movements have been somewhat erratic but denies rectal bleeding or melena. Can be prone to constipation. Denies family history of colorectal cancer.   Review of Systems As per history of present illness, otherwise negative  Current Medications, Allergies, Past Medical History, Past Surgical History, Family History and Social History were reviewed in [facility] record.     Objective:   Physical Exam BP 146/88 mmHg  Pulse 68  Ht 5\' 11"  (1.803 m)  Wt 262 lb 6 oz (119.013 kg)  BMI 36.61 kg/m2 Constitutional: Well-developed and well-nourished. No distress. HEENT: Normocephalic and atraumatic. Oropharynx is clear and moist. No oropharyngeal exudate. Conjunctivae are normal.  No scleral icterus. Neck: Neck supple. Trachea midline. Cardiovascular: Normal rate, regular rhythm and intact distal pulses. No M/R/G Pulmonary/chest: Effort normal and breath sounds normal. No wheezing, rales or rhonchi. Abdominal: Soft, nontender, nondistended. Bowel sounds active  throughout. There are no masses palpable. No hepatosplenomegaly. Extremities: no clubbing, cyanosis, or edema Lymphadenopathy: No cervical adenopathy noted. Neurological: Alert and oriented to person place and time. Skin: Skin is warm and dry. No rashes noted. Psychiatric: Normal mood and affect. Behavior is normal.  EGD December 2014 by Dr. Sharlett Iles -- small hiatal hernia, and biopsies negative for EoE.      Assessment & Plan:  49 year old male with history of GERD, atypical chest pain, hypotensive esophageal contractions with incomplete bolus clearance on previous manometry, hypertension, hyperlipidemia and OSA who seen for follow-up.  1. GERD, atypical chest pain -- some of his symptoms may be secondary to reflux. Continue Dexilant 60 mg daily. Recently started ranitidine 300 mg each evening. Hopefully this will provide benefit. I recommended over-the-counter Gaviscon per bottle instruction for as needed breakthrough symptoms. He is worried that there is more wrong with his esophagus and requesting upper endoscopy. I have tried to allay fears, but he would prefer direct inspection. We discussed the risks, benefits and alternatives, he is agreeable to proceed and we will schedule upper endoscopy. I briefly discussed addition of amitriptyline for esophageal hypersensitivity. Will make decisions pending endoscopy results  2. Colorectal cancer screening -- colonoscopy recommended for average risk screening in this 49 year old F can American male.  Risks benefits and alternatives discussed and he is agreeable to proceed

## 2015-07-07 ENCOUNTER — Ambulatory Visit (HOSPITAL_BASED_OUTPATIENT_CLINIC_OR_DEPARTMENT_OTHER): Payer: BLUE CROSS/BLUE SHIELD

## 2015-07-14 ENCOUNTER — Ambulatory Visit (HOSPITAL_BASED_OUTPATIENT_CLINIC_OR_DEPARTMENT_OTHER)
Admission: RE | Admit: 2015-07-14 | Discharge: 2015-07-14 | Disposition: A | Discharge status: Home / Self Care | Payer: BLUE CROSS/BLUE SHIELD | Source: Ambulatory Visit | Attending: Family Medicine | Admitting: Family Medicine

## 2015-07-14 DIAGNOSIS — R1013 Epigastric pain: Secondary | ICD-10-CM

## 2015-07-14 DIAGNOSIS — R079 Chest pain, unspecified: Secondary | ICD-10-CM | POA: Diagnosis not present

## 2015-07-14 DIAGNOSIS — I5189 Other ill-defined heart diseases: Secondary | ICD-10-CM | POA: Insufficient documentation

## 2015-07-14 DIAGNOSIS — K5900 Constipation, unspecified: Secondary | ICD-10-CM

## 2015-07-14 DIAGNOSIS — I10 Essential (primary) hypertension: Secondary | ICD-10-CM | POA: Diagnosis not present

## 2015-07-14 NOTE — Progress Notes (Signed)
Echocardiogram 2D Echocardiogram has been performed.  Tresa Res 07/14/2015, 11:47 AM

## 2015-08-18 ENCOUNTER — Encounter: Payer: Self-pay | Admitting: Internal Medicine

## 2015-08-25 ENCOUNTER — Ambulatory Visit: Payer: BLUE CROSS/BLUE SHIELD | Admitting: Physician Assistant

## 2015-08-25 ENCOUNTER — Telehealth: Payer: Self-pay | Admitting: Physician Assistant

## 2015-08-26 ENCOUNTER — Ambulatory Visit (INDEPENDENT_AMBULATORY_CARE_PROVIDER_SITE_OTHER): Payer: BLUE CROSS/BLUE SHIELD | Admitting: Medical

## 2015-08-26 ENCOUNTER — Ambulatory Visit (HOSPITAL_BASED_OUTPATIENT_CLINIC_OR_DEPARTMENT_OTHER)
Admission: RE | Admit: 2015-08-26 | Discharge: 2015-08-26 | Disposition: A | Discharge status: Home / Self Care | Payer: BLUE CROSS/BLUE SHIELD | Source: Ambulatory Visit | Attending: Medical | Admitting: Medical

## 2015-08-26 ENCOUNTER — Encounter: Payer: Self-pay | Admitting: Medical

## 2015-08-26 ENCOUNTER — Encounter: Payer: Self-pay | Admitting: Internal Medicine

## 2015-08-26 VITALS — BP 130/88 | HR 99 | Temp 98.3°F | Ht 71.0 in | Wt 266.0 lb

## 2015-08-26 DIAGNOSIS — J309 Allergic rhinitis, unspecified: Secondary | ICD-10-CM

## 2015-08-26 DIAGNOSIS — M5441 Lumbago with sciatica, right side: Secondary | ICD-10-CM | POA: Diagnosis present

## 2015-08-26 DIAGNOSIS — J3489 Other specified disorders of nose and nasal sinuses: Secondary | ICD-10-CM

## 2015-08-26 MED ORDER — FLUTICASONE PROPIONATE 50 MCG/ACT NA SUSP: 2.0000 | Freq: Every day | NASAL | Status: DC

## 2015-08-26 MED ORDER — CYCLOBENZAPRINE HCL 5 MG PO TABS: 5.0000 mg | ORAL_TABLET | Freq: Every day | ORAL | Status: DC

## 2015-08-26 MED ORDER — AZITHROMYCIN 250 MG PO TABS: ORAL_TABLET | ORAL | Status: DC

## 2015-08-26 MED ORDER — DICLOFENAC SODIUM 75 MG PO TBEC: 75.0000 mg | DELAYED_RELEASE_TABLET | Freq: Two times a day (BID) | ORAL | Status: DC

## 2015-08-26 MED ORDER — HYDROCODONE-ACETAMINOPHEN 5-325 MG PO TABS: 1.0000 | ORAL_TABLET | Freq: Four times a day (QID) | ORAL | Status: DC | PRN

## 2015-08-26 NOTE — Patient Instructions (Addendum)
For back pain/sciatica. Diclofenac rx for inflammation. Cyclobenzaprine muscle relaxant. For severe pain norco(but not to use when driving/working etc)  Back stretches.  Xray today lspine.  For allergic rhintis. Will rx flonase and advise take your claritin.  If sinus pressure worsens/sinusitis like azithromycin  Follow up in 10 days or as needed
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
## 2015-08-26 NOTE — Progress Notes (Signed)
Pre visit review using our clinic review tool, if applicable. No additional management support is needed unless otherwise documented below in the visit note. 

## 2015-08-30 NOTE — Telephone Encounter (Signed)
No charge. 

## 2015-08-30 NOTE — Telephone Encounter (Signed)
Pt came in at 11:00am for appt scheduled for 08/25/15 10:00am. Pt was rescheduled to 12/15 with Percell Miller. Charge or no charge?

## 2015-09-09 ENCOUNTER — Ambulatory Visit: Payer: Self-pay | Admitting: Internal Medicine

## 2015-09-24 ENCOUNTER — Ambulatory Visit: Payer: Self-pay | Admitting: Family Medicine

## 2015-09-30 ENCOUNTER — Ambulatory Visit (INDEPENDENT_AMBULATORY_CARE_PROVIDER_SITE_OTHER): Payer: BLUE CROSS/BLUE SHIELD | Admitting: Family Medicine

## 2015-09-30 ENCOUNTER — Encounter: Payer: Self-pay | Admitting: Family Medicine

## 2015-09-30 VITALS — BP 120/80 | HR 76 | Temp 97.0°F | Ht 72.0 in | Wt 264.0 lb

## 2015-09-30 DIAGNOSIS — R3911 Hesitancy of micturition: Secondary | ICD-10-CM

## 2015-09-30 DIAGNOSIS — Z0000 Encounter for general adult medical examination without abnormal findings: Secondary | ICD-10-CM

## 2015-09-30 DIAGNOSIS — R739 Hyperglycemia, unspecified: Secondary | ICD-10-CM | POA: Diagnosis not present

## 2015-09-30 DIAGNOSIS — I10 Essential (primary) hypertension: Secondary | ICD-10-CM

## 2015-09-30 DIAGNOSIS — K449 Diaphragmatic hernia without obstruction or gangrene: Secondary | ICD-10-CM

## 2015-09-30 DIAGNOSIS — E669 Obesity, unspecified: Secondary | ICD-10-CM

## 2015-09-30 DIAGNOSIS — K760 Fatty (change of) liver, not elsewhere classified: Secondary | ICD-10-CM

## 2015-09-30 DIAGNOSIS — G4733 Obstructive sleep apnea (adult) (pediatric): Secondary | ICD-10-CM

## 2015-09-30 DIAGNOSIS — E785 Hyperlipidemia, unspecified: Secondary | ICD-10-CM | POA: Diagnosis not present

## 2015-09-30 DIAGNOSIS — K219 Gastro-esophageal reflux disease without esophagitis: Secondary | ICD-10-CM | POA: Diagnosis not present

## 2015-09-30 DIAGNOSIS — Z23 Encounter for immunization: Secondary | ICD-10-CM

## 2015-09-30 HISTORY — DX: Hesitancy of micturition: R39.11

## 2015-09-30 LAB — LIPID PANEL
CHOL/HDL RATIO: 7
CHOLESTEROL: 250 mg/dL — AB (ref 0–200)
HDL: 38.1 mg/dL — ABNORMAL LOW (ref >39.00)
LDL CALC: 174 mg/dL — AB (ref 0–99)
NonHDL: 212.18
TRIGLYCERIDES: 190 mg/dL — AB (ref 0.0–149.0)
VLDL: 38 mg/dL (ref 0.0–40.0)

## 2015-09-30 LAB — COMPREHENSIVE METABOLIC PANEL
ALBUMIN: 4.5 g/dL (ref 3.5–5.2)
ALT: 46 U/L (ref 0–53)
AST: 29 U/L (ref 0–37)
Alkaline Phosphatase: 72 U/L (ref 39–117)
BILIRUBIN TOTAL: 0.4 mg/dL (ref 0.2–1.2)
BUN: 16 mg/dL (ref 6–23)
CALCIUM: 9.4 mg/dL (ref 8.4–10.5)
CHLORIDE: 104 meq/L (ref 96–112)
CO2: 24 mEq/L (ref 19–32)
CREATININE: 1 mg/dL (ref 0.40–1.50)
GFR: 102.6 mL/min (ref >60.00)
Glucose, Bld: 93 mg/dL (ref 70–99)
Potassium: 3.9 mEq/L (ref 3.5–5.1)
SODIUM: 139 meq/L (ref 135–145)
Total Protein: 7.5 g/dL (ref 6.0–8.3)

## 2015-09-30 LAB — CBC
HCT: 50.5 % (ref 39.0–52.0)
Hemoglobin: 16.7 g/dL (ref 13.0–17.0)
MCHC: 33.1 g/dL (ref 30.0–36.0)
MCV: 84.7 fl (ref 78.0–100.0)
PLATELETS: 210 10*3/uL (ref 150.0–400.0)
RBC: 5.96 Mil/uL — AB (ref 4.22–5.81)
RDW: 14.5 % (ref 11.5–15.5)
WBC: 4.7 10*3/uL (ref 4.0–10.5)

## 2015-09-30 LAB — HEMOGLOBIN A1C: HEMOGLOBIN A1C: 6.4 % (ref 4.6–6.5)

## 2015-09-30 LAB — PSA: PSA: 1.97 ng/mL (ref 0.10–4.00)

## 2015-09-30 LAB — TSH: TSH: 1.79 u[IU]/mL (ref 0.35–4.50)

## 2015-09-30 MED ORDER — PRAVASTATIN SODIUM 20 MG PO TABS: 20.0000 mg | ORAL_TABLET | Freq: Every day | ORAL | Status: DC

## 2015-09-30 NOTE — Patient Instructions (Signed)
Preventive Care for Adults, Male A healthy lifestyle and preventive care can promote health and wellness. Preventive health guidelines for men include the following key practices:  A routine yearly physical is a good way to check with your health care provider about your health and preventative screening. It is a chance to share any concerns and updates on your health and to receive a thorough exam.  Visit your dentist for a routine exam and preventative care every 6 months. Brush your teeth twice a day and floss once a day. Good oral hygiene prevents tooth decay and gum disease.  The frequency of eye exams is based on your age, health, family medical history, use of contact lenses, and other factors. Follow your health care provider's recommendations for frequency of eye exams.  Eat a healthy diet. Foods such as vegetables, fruits, whole grains, low-fat dairy products, and lean protein foods contain the nutrients you need without too many calories. Decrease your intake of foods high in solid fats, added sugars, and salt. Eat the right amount of calories for you.Get information about a proper diet from your health care provider, if necessary.  Regular physical exercise is one of the most important things you can do for your health. Most adults should get at least 150 minutes of moderate-intensity exercise (any activity that increases your heart rate and causes you to sweat) each week. In addition, most adults need muscle-strengthening exercises on 2 or more days a week.  Maintain a healthy weight. The body mass index (BMI) is a screening tool to identify possible weight problems. It provides an estimate of body fat based on height and weight. Your health care provider can find your BMI and can help you achieve or maintain a healthy weight.For adults 20 years and older:  A BMI below 18.5 is considered underweight.  A BMI of 18.5 to 24.9 is normal.  A BMI of 25 to 29.9 is considered  overweight.  A BMI of 30 and above is considered obese.  Maintain normal blood lipids and cholesterol levels by exercising and minimizing your intake of saturated fat. Eat a balanced diet with plenty of fruit and vegetables. Blood tests for lipids and cholesterol should begin at age 20 and be repeated every 5 years. If your lipid or cholesterol levels are high, you are over 50, or you are at high risk for heart disease, you may need your cholesterol levels checked more frequently.Ongoing high lipid and cholesterol levels should be treated with medicines if diet and exercise are not working.  If you smoke, find out from your health care provider how to quit. If you do not use tobacco, do not start.  Lung cancer screening is recommended for adults aged 55-80 years who are at high risk for developing lung cancer because of a history of smoking. A yearly low-dose CT scan of the lungs is recommended for people who have at least a 30-pack-year history of smoking and are a current smoker or have quit within the past 15 years. A pack year of smoking is smoking an average of 1 pack of cigarettes a day for 1 year (for example: 1 pack a day for 30 years or 2 packs a day for 15 years). Yearly screening should continue until the smoker has stopped smoking for at least 15 years. Yearly screening should be stopped for people who develop a health problem that would prevent them from having lung cancer treatment.  If you choose to drink alcohol, do not have more   than 2 drinks per day. One drink is considered to be 12 ounces (355 mL) of beer, 5 ounces (148 mL) of wine, or 1.5 ounces (44 mL) of liquor.  Avoid use of street drugs. Do not share needles with anyone. Ask for help if you need support or instructions about stopping the use of drugs.  High blood pressure causes heart disease and increases the risk of stroke. Your blood pressure should be checked at least every 1-2 years. Ongoing high blood pressure should be  treated with medicines, if weight loss and exercise are not effective.  If you are 7-62 years old, ask your health care provider if you should take aspirin to prevent heart disease.  Diabetes screening is done by taking a blood sample to check your blood glucose level after you have not eaten for a certain period of time (fasting). If you are not overweight and you do not have risk factors for diabetes, you should be screened once every 3 years starting at age 22. If you are overweight or obese and you are 58-64 years of age, you should be screened for diabetes every year as part of your cardiovascular risk assessment.  Colorectal cancer can be detected and often prevented. Most routine colorectal cancer screening begins at the age of 57 and continues through age 17. However, your health care provider may recommend screening at an earlier age if you have risk factors for colon cancer. On a yearly basis, your health care provider may provide home test kits to check for hidden blood in the stool. Use of a small camera at the end of a tube to directly examine the colon (sigmoidoscopy or colonoscopy) can detect the earliest forms of colorectal cancer. Talk to your health care provider about this at age 73, when routine screening begins. Direct exam of the colon should be repeated every 5-10 years through age 36, unless early forms of precancerous polyps or small growths are found.  People who are at an increased risk for hepatitis B should be screened for this virus. You are considered at high risk for hepatitis B if:  You were born in a country where hepatitis B occurs often. Talk with your health care provider about which countries are considered high risk.  Your parents were born in a high-risk country and you have not received a shot to protect against hepatitis B (hepatitis B vaccine).  You have HIV or AIDS.  You use needles to inject street drugs.  You live with, or have sex with, someone who  has hepatitis B.  You are a man who has sex with other men (MSM).  You get hemodialysis treatment.  You take certain medicines for conditions such as cancer, organ transplantation, and autoimmune conditions.  Hepatitis C blood testing is recommended for all people born from [birth year] through 1965 and any individual with known risks for hepatitis C.  Practice safe sex. Use condoms and avoid high-risk sexual practices to reduce the spread of sexually transmitted infections (STIs). STIs include gonorrhea, chlamydia, syphilis, trichomonas, herpes, HPV, and human immunodeficiency virus (HIV). Herpes, HIV, and HPV are viral illnesses that have no cure. They can result in disability, cancer, and death.  If you are a man who has sex with other men, you should be screened at least once per year for:  HIV.  Urethral, rectal, and pharyngeal infection of gonorrhea, chlamydia, or both.  If you are at risk of being infected with HIV, it is recommended that you take a  prescription medicine daily to prevent HIV infection. This is called preexposure prophylaxis (PrEP). You are considered at risk if:  You are a man who has sex with other men (MSM) and have other risk factors.  You are a heterosexual man, are sexually active, and are at increased risk for HIV infection.  You take drugs by injection.  You are sexually active with a partner who has HIV.  Talk with your health care provider about whether you are at high risk of being infected with HIV. If you choose to begin PrEP, you should first be tested for HIV. You should then be tested every 3 months for as long as you are taking PrEP.  A one-time screening for abdominal aortic aneurysm (AAA) and surgical repair of large AAAs by ultrasound are recommended for men ages 44 to 66 years who are current or former smokers.  Healthy men should no longer receive prostate-specific antigen (PSA) blood tests as part of routine cancer screening. Talk with your health  care provider about prostate cancer screening.  Testicular cancer screening is not recommended for adult males who have no symptoms. Screening includes self-exam, a health care provider exam, and other screening tests. Consult with your health care provider about any symptoms you have or any concerns you have about testicular cancer.  Use sunscreen. Apply sunscreen liberally and repeatedly throughout the day. You should seek shade when your shadow is shorter than you. Protect yourself by wearing long sleeves, pants, a wide-brimmed hat, and sunglasses year round, whenever you are outdoors.  Once a month, do a whole-body skin exam, using a mirror to look at the skin on your back. Tell your health care provider about new moles, moles that have irregular borders, moles that are larger than a pencil eraser, or moles that have changed in shape or color.  Stay current with required vaccines (immunizations).  Influenza vaccine. All adults should be immunized every year.  Tetanus, diphtheria, and acellular pertussis (Td, Tdap) vaccine. An adult who has not previously received Tdap or who does not know his vaccine status should receive 1 dose of Tdap. This initial dose should be followed by tetanus and diphtheria toxoids (Td) booster doses every 10 years. Adults with an unknown or incomplete history of completing a 3-dose immunization series with Td-containing vaccines should begin or complete a primary immunization series including a Tdap dose. Adults should receive a Td booster every 10 years.  Varicella vaccine. An adult without evidence of immunity to varicella should receive 2 doses or a second dose if he has previously received 1 dose.  Human papillomavirus (HPV) vaccine. Males aged 11-21 years who have not received the vaccine previously should receive the 3-dose series. Males aged 22-26 years may be immunized. Immunization is recommended through the age of 23 years for any male who has sex with males  and did not get any or all doses earlier. Immunization is recommended for any person with an immunocompromised condition through the age of 72 years if he did not get any or all doses earlier. During the 3-dose series, the second dose should be obtained 4-8 weeks after the first dose. The third dose should be obtained 24 weeks after the first dose and 16 weeks after the second dose.  Zoster vaccine. One dose is recommended for adults aged 23 years or older unless certain conditions are present.  Measles, mumps, and rubella (MMR) vaccine. Adults born before [birth year] generally are considered immune to measles and mumps. Adults born in [birth year]  or later should have 1 or more doses of MMR vaccine unless there is a contraindication to the vaccine or there is laboratory evidence of immunity to each of the three diseases. A routine second dose of MMR vaccine should be obtained at least 28 days after the first dose for students attending postsecondary schools, health care workers, or international travelers. People who received inactivated measles vaccine or an unknown type of measles vaccine during 1963-1967 should receive 2 doses of MMR vaccine. People who received inactivated mumps vaccine or an unknown type of mumps vaccine before 1979 and are at high risk for mumps infection should consider immunization with 2 doses of MMR vaccine. Unvaccinated health care workers born before [birth year] who lack laboratory evidence of measles, mumps, or rubella immunity or laboratory confirmation of disease should consider measles and mumps immunization with 2 doses of MMR vaccine or rubella immunization with 1 dose of MMR vaccine.  Pneumococcal 13-valent conjugate (PCV13) vaccine. When indicated, a person who is uncertain of his immunization history and has no record of immunization should receive the PCV13 vaccine. All adults 52 years of age and older should receive this vaccine. An adult aged 51 years or older who has certain medical  conditions and has not been previously immunized should receive 1 dose of PCV13 vaccine. This PCV13 should be followed with a dose of pneumococcal polysaccharide (PPSV23) vaccine. Adults who are at high risk for pneumococcal disease should obtain the PPSV23 vaccine at least 8 weeks after the dose of PCV13 vaccine. Adults older than 48 years of age who have normal immune system function should obtain the PPSV23 vaccine dose at least 1 year after the dose of PCV13 vaccine.  Pneumococcal polysaccharide (PPSV23) vaccine. When PCV13 is also indicated, PCV13 should be obtained first. All adults aged 67 years and older should be immunized. An adult younger than age 7 years who has certain medical conditions should be immunized. Any person who resides in a nursing home or long-term care facility should be immunized. An adult smoker should be immunized. People with an immunocompromised condition and certain other conditions should receive both PCV13 and PPSV23 vaccines. People with human immunodeficiency virus (HIV) infection should be immunized as soon as possible after diagnosis. Immunization during chemotherapy or radiation therapy should be avoided. Routine use of PPSV23 vaccine is not recommended for American Indians, [place] Natives, or people younger than 65 years unless there are medical conditions that require PPSV23 vaccine. When indicated, people who have unknown immunization and have no record of immunization should receive PPSV23 vaccine. One-time revaccination 5 years after the first dose of PPSV23 is recommended for people aged 19-64 years who have chronic kidney failure, nephrotic syndrome, asplenia, or immunocompromised conditions. People who received 1-2 doses of PPSV23 before age 34 years should receive another dose of PPSV23 vaccine at age 64 years or later if at least 5 years have passed since the previous dose. Doses of PPSV23 are not needed for people immunized with PPSV23 at or after age 110  years.  Meningococcal vaccine. Adults with asplenia or persistent complement component deficiencies should receive 2 doses of quadrivalent meningococcal conjugate (MenACWY-D) vaccine. The doses should be obtained at least 2 months apart. Microbiologists working with certain meningococcal bacteria, [employer] recruits, people at risk during an outbreak, and people who travel to or live in countries with a high rate of meningitis should be immunized. A first-year college student up through age 24 years who is living in a residence hall should receive a  dose if he did not receive a dose on or after his 16th birthday. Adults who have certain high-risk conditions should receive one or more doses of vaccine.  Hepatitis A vaccine. Adults who wish to be protected from this disease, have chronic liver disease, work with hepatitis A-infected animals, work in hepatitis A research labs, or travel to or work in countries with a high rate of hepatitis A should be immunized. Adults who were previously unvaccinated and who anticipate close contact with an international adoptee during the first 60 days after arrival in the [place] from a country with a high rate of hepatitis A should be immunized.  Hepatitis B vaccine. Adults should be immunized if they wish to be protected from this disease, are under age 34 years and have diabetes, have chronic liver disease, have had more than one sex partner in the past 6 months, may be exposed to blood or other infectious body fluids, are household contacts or sex partners of hepatitis B positive people, are clients or workers in certain care facilities, or travel to or work in countries with a high rate of hepatitis B.  Haemophilus influenzae type b (Hib) vaccine. A previously unvaccinated person with asplenia or sickle cell disease or having a scheduled splenectomy should receive 1 dose of Hib vaccine. Regardless of previous immunization, a recipient of a hematopoietic stem cell  transplant should receive a 3-dose series 6-12 months after his successful transplant. Hib vaccine is not recommended for adults with HIV infection. Preventive Service / Frequency Ages 77 to 55  Blood pressure check.** / Every 3-5 years.  Lipid and cholesterol check.** / Every 5 years beginning at age 66.  Hepatitis C blood test.** / For any individual with known risks for hepatitis C.  Skin self-exam. / Monthly.  Influenza vaccine. / Every year.  Tetanus, diphtheria, and acellular pertussis (Tdap, Td) vaccine.** / Consult your health care provider. 1 dose of Td every 10 years.  Varicella vaccine.** / Consult your health care provider.  HPV vaccine. / 3 doses over 6 months, if 45 or younger.  Measles, mumps, rubella (MMR) vaccine.** / You need at least 1 dose of MMR if you were born in 1957 or later. You may also need a second dose.  Pneumococcal 13-valent conjugate (PCV13) vaccine.** / Consult your health care provider.  Pneumococcal polysaccharide (PPSV23) vaccine.** / 1 to 2 doses if you smoke cigarettes or if you have certain conditions.  Meningococcal vaccine.** / 1 dose if you are age 81 to 79 years and a Market researcher living in a residence hall, or have one of several medical conditions. You may also need additional booster doses.  Hepatitis A vaccine.** / Consult your health care provider.  Hepatitis B vaccine.** / Consult your health care provider.  Haemophilus influenzae type b (Hib) vaccine.** / Consult your health care provider. Ages 6 to 58  Blood pressure check.** / Every year.  Lipid and cholesterol check.** / Every 5 years beginning at age 89.  Lung cancer screening. / Every year if you are aged 84-80 years and have a 30-pack-year history of smoking and currently smoke or have quit within the past 15 years. Yearly screening is stopped once you have quit smoking for at least 15 years or develop a health problem that would prevent you from having  lung cancer treatment.  Fecal occult blood test (FOBT) of stool. / Every year beginning at age 90 and continuing until age 73. You may not have to do  this test if you get a colonoscopy every 10 years.  Flexible sigmoidoscopy** or colonoscopy.** / Every 5 years for a flexible sigmoidoscopy or every 10 years for a colonoscopy beginning at age 31 and continuing until age 2.  Hepatitis C blood test.** / For all people born from [birth year] through 1965 and any individual with known risks for hepatitis C.  Skin self-exam. / Monthly.  Influenza vaccine. / Every year.  Tetanus, diphtheria, and acellular pertussis (Tdap/Td) vaccine.** / Consult your health care provider. 1 dose of Td every 10 years.  Varicella vaccine.** / Consult your health care provider.  Zoster vaccine.** / 1 dose for adults aged 62 years or older.  Measles, mumps, rubella (MMR) vaccine.** / You need at least 1 dose of MMR if you were born in 1955 or later. You may also need a second dose.  Pneumococcal 13-valent conjugate (PCV13) vaccine.** / Consult your health care provider.  Pneumococcal polysaccharide (PPSV23) vaccine.** / 1 to 2 doses if you smoke cigarettes or if you have certain conditions.  Meningococcal vaccine.** / Consult your health care provider.  Hepatitis A vaccine.** / Consult your health care provider.  Hepatitis B vaccine.** / Consult your health care provider.  Haemophilus influenzae type b (Hib) vaccine.** / Consult your health care provider. Ages 32 and over  Blood pressure check.** / Every year.  Lipid and cholesterol check.**/ Every 5 years beginning at age 62.  Lung cancer screening. / Every year if you are aged 78-80 years and have a 30-pack-year history of smoking and currently smoke or have quit within the past 15 years. Yearly screening is stopped once you have quit smoking for at least 15 years or develop a health problem that would prevent you from having lung cancer treatment.  Fecal  occult blood test (FOBT) of stool. / Every year beginning at age 34 and continuing until age 49. You may not have to do this test if you get a colonoscopy every 10 years.  Flexible sigmoidoscopy** or colonoscopy.** / Every 5 years for a flexible sigmoidoscopy or every 10 years for a colonoscopy beginning at age 71 and continuing until age 52.  Hepatitis C blood test.** / For all people born from [birth year] through 1963 and any individual with known risks for hepatitis C.  Abdominal aortic aneurysm (AAA) screening.** / A one-time screening for ages 50 to 73 years who are current or former smokers.  Skin self-exam. / Monthly.  Influenza vaccine. / Every year.  Tetanus, diphtheria, and acellular pertussis (Tdap/Td) vaccine.** / 1 dose of Td every 10 years.  Varicella vaccine.** / Consult your health care provider.  Zoster vaccine.** / 1 dose for adults aged 30 years or older.  Pneumococcal 13-valent conjugate (PCV13) vaccine.** / 1 dose for all adults aged 12 years and older.  Pneumococcal polysaccharide (PPSV23) vaccine.** / 1 dose for all adults aged 31 years and older.  Meningococcal vaccine.** / Consult your health care provider.  Hepatitis A vaccine.** / Consult your health care provider.  Hepatitis B vaccine.** / Consult your health care provider.  Haemophilus influenzae type b (Hib) vaccine.** / Consult your health care provider. **Family history and personal history of risk and conditions may change your health care provider's recommendations.   This information is not intended to replace advice given to you by your health care provider. Make sure you discuss any questions you have with your health care provider.   Document Released: 10/24/2001 Document Revised: 09/18/2014 Document Reviewed: 01/23/2011 Elsevier Interactive Patient Education 2016  [employer].

## 2015-09-30 NOTE — Progress Notes (Signed)
Pre visit review using our clinic review tool, if applicable. No additional management support is needed unless otherwise documented below in the visit note. 

## 2015-09-30 NOTE — Progress Notes (Signed)
Islam Dellacroce Hunkele [id number] Dec 05, 1965 10/10/2015      Patient Progress Note   Subjective  Chief Complaint  Chief Complaint  Patient presents with  . Annual Exam    HPI  50 year old male presents for routine follow up. Main complaint is reflux. Occurs right after meals. Pain at night is terrible even with using more pillows. Has diaphragmatic hernia.  Difficulty urinating, had it checked out in past but is getting worse from separate hernia. Taking meds as prescribed. Patient denies shortness of breath, chest pain,changes in urination, GI issues, recent fevers or illnesses. CPAP compliant, needs to see pulmonologist to have machine checked. Has eczema around neck      Past Medical History  Diagnosis Date  . Mixed hyperlipidemia   . Hypertension   . Bone spur     left foot  . OSA (obstructive sleep apnea)     s/p UPPP  . Murmur   . Stomach ulcer     from PCP  . Erectile dysfunction 01/15/2013  . Esophageal reflux 01/15/2013  . Hyperglycemia 07/13/2013  . Hiatal hernia   . GERD (gastroesophageal reflux disease)   . Hiatal hernia with gastroesophageal reflux 03/20/2014  . Fatty liver 07/04/2015  . Urinary hesitancy 09/30/2015    Past Surgical History  Procedure Laterality Date  . Tonsillectomy    . Uvulectomy    . Esophageal manometry N/A 09/01/2013    Procedure: ESOPHAGEAL MANOMETRY (EM);  Surgeon: Sable Feil, MD;  Location: [facility];  Service: Endoscopy;  Laterality: N/A;    Family History  Problem Relation Age of Onset  . Dementia Mother   . Diabetes Mother   . Hypertension Mother   . Heart failure Mother   . Hypertension      siblings  . Colon cancer Neg Hx     Social History   Social History  . Marital Status: Married    Spouse Name: N/A  . Number of Children: N/A  . Years of Education: N/A   Occupational History  . Scientist, forensic    Social History Main Topics  . Smoking status: Never Smoker   . Smokeless tobacco: Never Used  . Alcohol Use:  No  . Drug Use: No  . Sexual Activity: Yes   Other Topics Concern  . Not on file   Social History Narrative   Tobacco Use - No.    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 acetaminophen (TYLENOL) 500 MG tablet Take 1 tablet (500 mg total) by mouth every 6 (six) hours as needed for pain. 60 tablet 0  . aspirin EC 81 MG tablet Take 81 mg by mouth daily.    . cyclobenzaprine (FLEXERIL) 5 MG tablet Take 1 tablet (5 mg total) by mouth at bedtime. 10 tablet 1  . dexlansoprazole (DEXILANT) 60 MG capsule Take 1 capsule (60 mg total) by mouth daily. 90 capsule 2  . diclofenac (VOLTAREN) 75 MG EC tablet Take 1 tablet (75 mg total)  by mouth 2 (two) times daily. 30 tablet 0  . fluticasone (FLONASE) 50 MCG/ACT nasal spray Place 2 sprays into both nostrils daily. 16 g 6  . HYDROcodone-acetaminophen (NORCO) 5-325 MG tablet Take 1 tablet by mouth every 6 (six) hours as needed for moderate pain. 21 tablet 0  . hyoscyamine (LEVSIN SL) 0.125 MG SL tablet Place 1 tablet (0.125 mg total) under the tongue every 4 (four) hours as needed for cramping. 40 tablet 1  . Multiple Vitamin (MULTIVITAMIN) tablet Take 1 tablet by mouth daily.    . Na Sulfate-K Sulfate-Mg Sulf SOLN Suprep-Use as directed 354 mL 0  . pravastatin (PRAVACHOL) 10 MG tablet Take 1 tablet (10 mg total) by mouth at bedtime. 90 tablet 1  . ranitidine (ZANTAC) 300 MG tablet Take 1 tablet (300 mg total) by mouth at bedtime. 90 tablet 1  . tadalafil (CIALIS) 5 MG tablet Take 1 tablet (5 mg total) by mouth daily as needed for erectile dysfunction. 30 tablet 5  . triamterene-hydrochlorothiazide  (MAXZIDE-25) 37.5-25 MG per tablet Take 1 tablet by mouth daily. 90 tablet 1   No current facility-administered medications on file prior to visit.    No Known Allergies  Review of Systems   Constitutional: Negative for fever and malaise/fatigue.  HENT: Negative for congestion.  Eyes: Negative for discharge.  Respiratory: Negative for shortness of breath.  Cardiovascular: Negative for chest pain, palpitations and leg swelling.  Gastrointestinal: Negative for nausea, abdominal pain and diarrhea.  Genitourinary: Negative for dysuria and urgency, hematuria and flank pain. Positive for difficulty urinating Musculoskeletal: Negative for myalgias and falls.  Skin: Positive for eczema Neurological: Negative for loss of consciousness and headaches.  Endo/Heme/Allergies: Negative for polydipsia.  Psychiatric/Behavioral: Negative for depression and suicidal ideas. The patient is not nervous/anxious and does not have insomnia.   Objective  BP 120/80 mmHg  Pulse 76  Temp(Src) 97 F (36.1 C) (Oral)  Ht 6' (1.829 m)  Wt 264 lb (119.75 kg)  BMI 35.80 kg/m2  SpO2 97%  Physical Exam   Constitutional: Oriented to person, place, and time. Appears well-nourished. No distress.  Eyes: EOM are normal. Pupils are equal, round, and reactive to light.  Cardiovascular: Normal rate and regular rhythm.  Pulmonary/Chest: Breath sounds normal.  Abdominal: Soft. Bowel sounds are normal.  Lymphadenopathy:   No cervical adenopathy.  Neurological: Alert and oriented to person, place, and time. Normal reflexes. No cranial nerve deficit.    Assessment & Plan  Hyperlipidemia -Tolerating statin, increase dose to 20mg  -Encouraged heart healthy diet, avoid trans fats, minimize simple carbs and saturated fats.  -Increase exercise as tolerated  Essential hypertension -Well controlled, no changes to meds. -Encouraged heart healthy diet and exercise as tolerated.   Obesity -Encouraged decrease po  intake and increase exercise as tolerated.  -Avoid trans fats, eat small, frequent meals every 4-5 hours with lean proteins, complex carbs and healthy fats. Minimize simple carbs, GMO foods.  GERD -Avoid offending foods, start probiotics.  -Do not eat large meals in late evening and consider raising head of bed.  -Continue current medications -Refer to gastroenterology for eval  Obstructive Sleep Apnea -Compliant with CPAP, continue to use -Refer to pulmonolgist for machine check  Difficulty Urinating -Previously diagnoses with hernia -Refer to urology/general surgery for eval -Not interested in medication

## 2015-10-01 LAB — URINALYSIS, ROUTINE W REFLEX MICROSCOPIC
Bilirubin Urine: NEGATIVE
Hgb urine dipstick: NEGATIVE
KETONES UR: NEGATIVE
LEUKOCYTES UA: NEGATIVE
NITRITE: NEGATIVE
PH: 6 (ref 5.0–8.0)
SPECIFIC GRAVITY, URINE: 1.02 (ref 1.000–1.030)
TOTAL PROTEIN, URINE-UPE24: 30 — AB
UROBILINOGEN UA: 0.2 (ref 0.0–1.0)
Urine Glucose: NEGATIVE

## 2015-10-04 ENCOUNTER — Telehealth: Payer: Self-pay | Admitting: Family Medicine

## 2015-10-04 NOTE — Telephone Encounter (Signed)
Caller name: [name] Foster  Relationship to patient: Self  Can be reached: [phone]   Reason for call: Pt says that he is returning your call about lab results.

## 2015-10-10 ENCOUNTER — Encounter: Payer: Self-pay | Admitting: Family Medicine

## 2015-10-10 DIAGNOSIS — G4730 Sleep apnea, unspecified: Secondary | ICD-10-CM | POA: Insufficient documentation

## 2015-10-10 DIAGNOSIS — Z0000 Encounter for general adult medical examination without abnormal findings: Secondary | ICD-10-CM | POA: Insufficient documentation

## 2015-10-10 NOTE — Assessment & Plan Note (Signed)
Encouraged DASH diet, decrease po intake and increase exercise as tolerated. Needs 7-8 hours of sleep nightly. Avoid trans fats, eat small, frequent meals every 4-5 hours with lean proteins, complex carbs and healthy fats. Minimize simple carbs 

## 2015-10-10 NOTE — Assessment & Plan Note (Signed)
Referred to urology for consideration

## 2015-10-10 NOTE — Assessment & Plan Note (Signed)
Avoid offending foods, start probiotics. Do not eat large meals in late evening and consider raising head of bed.  

## 2015-10-10 NOTE — Assessment & Plan Note (Signed)
Patient encouraged to maintain heart healthy diet, regular exercise, adequate sleep. Consider daily probiotics. Take medications as prescribed. Labs reviewed 

## 2015-10-10 NOTE — Assessment & Plan Note (Signed)
Tolerating statin, encouraged heart healthy diet, avoid trans fats, minimize simple carbs and saturated fats. Increase exercise as tolerated 

## 2015-10-10 NOTE — Assessment & Plan Note (Signed)
Referred to pulmonology for further management. °

## 2015-10-10 NOTE — Assessment & Plan Note (Addendum)
Avoid offending foods, start probiotics. Do not eat large meals in late evening and consider raising head of bed. Referred to gastroenterology, continue PPI

## 2015-10-10 NOTE — Assessment & Plan Note (Signed)
minimize simple carbs. Increase exercise as tolerated.  

## 2015-10-10 NOTE — Assessment & Plan Note (Signed)
Well controlled, no changes to meds. Encouraged heart healthy diet such as the DASH diet and exercise as tolerated.  °

## 2015-10-15 ENCOUNTER — Encounter: Payer: Self-pay | Admitting: Internal Medicine

## 2015-12-07 ENCOUNTER — Telehealth: Payer: Self-pay | Admitting: Family Medicine

## 2015-12-07 MED ORDER — PANTOPRAZOLE SODIUM 40 MG PO TBEC: 40.0000 mg | DELAYED_RELEASE_TABLET | Freq: Every day | ORAL | Status: DC

## 2015-12-07 NOTE — Telephone Encounter (Signed)
Called the patient updated medication list.  Sent only a #30 day supply. Patient will call in the morning with alternative if not approved.

## 2015-12-07 NOTE — Telephone Encounter (Signed)
So try Pantoprazole 40 mg tab 1 tab po daily prn reflux, disp #30 with 5 rf or #90 with 1 rf per patient preference, if they do not cover that then have patient check with pharmacist to confirm what alternative meds they cover and we will pick next option from that list.

## 2015-12-07 NOTE — Addendum Note (Signed)
Addended by: Sharon Seller B on: 12/07/2015 01:51 PM   Modules accepted: Orders, Medications

## 2015-12-07 NOTE — Telephone Encounter (Signed)
Caller name: Self  Can be reached: [phone]   Reason for call: State that his insurance no longer covers Dexilant or Nexium. Wants to know what other med he can take. States that he is not sure why they no longer cover.

## 2015-12-15 ENCOUNTER — Ambulatory Visit: Payer: Self-pay | Admitting: Internal Medicine

## 2015-12-17 ENCOUNTER — Ambulatory Visit: Payer: BLUE CROSS/BLUE SHIELD | Admitting: Podiatry

## 2015-12-29 ENCOUNTER — Ambulatory Visit (INDEPENDENT_AMBULATORY_CARE_PROVIDER_SITE_OTHER): Payer: BLUE CROSS/BLUE SHIELD | Admitting: Podiatry

## 2015-12-29 ENCOUNTER — Ambulatory Visit (INDEPENDENT_AMBULATORY_CARE_PROVIDER_SITE_OTHER): Payer: BLUE CROSS/BLUE SHIELD

## 2015-12-29 ENCOUNTER — Encounter: Payer: Self-pay | Admitting: Podiatry

## 2015-12-29 ENCOUNTER — Ambulatory Visit: Payer: Self-pay

## 2015-12-29 VITALS — BP 130/85 | HR 76 | Resp 16 | Ht 72.0 in | Wt 255.0 lb

## 2015-12-29 DIAGNOSIS — M7662 Achilles tendinitis, left leg: Secondary | ICD-10-CM

## 2015-12-29 DIAGNOSIS — M79671 Pain in right foot: Secondary | ICD-10-CM | POA: Diagnosis not present

## 2015-12-29 DIAGNOSIS — M79672 Pain in left foot: Secondary | ICD-10-CM | POA: Diagnosis not present

## 2015-12-29 DIAGNOSIS — M779 Enthesopathy, unspecified: Secondary | ICD-10-CM | POA: Diagnosis not present

## 2015-12-29 MED ORDER — TRIAMCINOLONE ACETONIDE 10 MG/ML IJ SUSP
10.0000 mg | Freq: Once | INTRAMUSCULAR | Status: AC
  Administered 2015-12-29: 10 mg

## 2015-12-29 MED ORDER — DICLOFENAC SODIUM 75 MG PO TBEC: 75.0000 mg | DELAYED_RELEASE_TABLET | Freq: Two times a day (BID) | ORAL | Status: DC

## 2015-12-29 NOTE — Patient Instructions (Signed)

## 2015-12-29 NOTE — Progress Notes (Signed)
   Subjective:    Patient ID: [name] Foster, male    DOB: September 11, 1966, 50 y.o.   MRN: [id number]  HPI Chief Complaint  Patient presents with  . Foot Pain    Left foot; back of heel & plantar forefoot; pt stated, "feels like knot on ball of foot"; x6 months  . Nail Problem    Bilateral; great toe; nail discoloration & thickened nails; pt stated, "wants all nails checked for nail fungus"; x3 weeks     Review of Systems  Constitutional: Positive for appetite change.  HENT: Positive for sinus pressure and trouble swallowing.   Respiratory: Positive for chest tightness.   Cardiovascular: Positive for chest pain and leg swelling.  Musculoskeletal: Positive for back pain and gait problem.  Neurological: Positive for light-headedness and headaches.  All other systems reviewed and are negative.      Objective:   Physical Exam        Assessment & Plan:

## 2015-12-29 NOTE — Progress Notes (Signed)
Subjective:     Patient ID: [name] Foster, male   DOB: [birth year], 50 y.o.   MRN: [id number]  HPI patient presents stating that he has a lot of pain in his left Achilles tendon that he's not had in a number of years and he also needs new orthotics. States the pain is been very intense make it hard for him to walk and he was quite active prior to occurring. Patient also has thickness around his skin with history of blistering on the plantar arch bilateral   Review of Systems  All other systems reviewed and are negative.      Objective:   Physical Exam  Constitutional: He is oriented to person, place, and time.  Cardiovascular: Intact distal pulses.   Musculoskeletal: Normal range of motion.  Neurological: He is oriented to person, place, and time.  Skin: Skin is warm.  Nursing note and vitals reviewed.  neurovascular status intact muscle strength adequate range of motion within normal limits with patient found to have exquisite discomfort in the left heel medial side with pain and noted to have some skin changes consistent with probable fungal infection. Patient has good digital perfusion is well oriented with significant equinus condition bilateral     Assessment:     Acute Achilles tendinitis left medial side along with probable fungal infection of the lower feet and also nails    Plan:     H&P and x-rays reviewed with patient. Did careful medial injection after first discussing risk of rupture with patient of 3 mg dexamethasone Kenalog 5 mg Xylocaine and then applied air fracture walker to completely immobilize. Instructed on ice therapy and gradual stretching exercises in the next week and for the right one and I went ahead and I am starting the patient on antifungal [place] Lamisil and will be seen back to reevaluate in 2 weeks. Also placed on diclofenac and discussed orthotics at next visit     X-ray report indicated large posterior spur left over right with flatfoot deformity

## 2016-01-03 ENCOUNTER — Encounter (HOSPITAL_COMMUNITY): Payer: Self-pay

## 2016-01-03 ENCOUNTER — Ambulatory Visit: Payer: Self-pay | Admitting: Family Medicine

## 2016-01-03 ENCOUNTER — Encounter (HOSPITAL_COMMUNITY): Payer: Self-pay | Admitting: Internal Medicine

## 2016-01-05 ENCOUNTER — Ambulatory Visit: Payer: Self-pay | Admitting: Internal Medicine

## 2016-01-12 ENCOUNTER — Ambulatory Visit: Payer: BLUE CROSS/BLUE SHIELD | Admitting: Podiatry

## 2016-01-13 ENCOUNTER — Telehealth: Payer: Self-pay | Admitting: Family Medicine

## 2016-01-13 MED ORDER — ESOMEPRAZOLE MAGNESIUM 40 MG PO CPDR: 40.0000 mg | DELAYED_RELEASE_CAPSULE | Freq: Every day | ORAL | Status: DC

## 2016-01-13 NOTE — Telephone Encounter (Signed)
OK to send in rx for Nexium 40 mg caps, 1 cap po daily disp #30 with 1 rf and check on PA for Ranitidine please

## 2016-01-13 NOTE — Telephone Encounter (Signed)
Pt's spouse would like to know if PCP could call in a Rx for Nexium until PA is complete on pt's Zantac. She says that she is aware that it is over the counter but would like to have a Rx so that they can use there flex card. She is aware that PCP is out of the office today. She is okay until PCP returns tomorrow, if need be.    Pharmacy: [facility],  - [employer]

## 2016-01-13 NOTE — Telephone Encounter (Signed)
Sent in to [facility] as patient requested. Did call to inform enough being sent in to cover until PA completed

## 2016-01-14 ENCOUNTER — Encounter (HOSPITAL_COMMUNITY): Payer: Self-pay | Admitting: Internal Medicine

## 2016-01-17 ENCOUNTER — Encounter: Payer: Self-pay | Admitting: Internal Medicine

## 2016-01-17 ENCOUNTER — Ambulatory Visit (AMBULATORY_SURGERY_CENTER): Payer: Self-pay

## 2016-01-17 ENCOUNTER — Encounter: Payer: Self-pay | Admitting: Podiatry

## 2016-01-17 ENCOUNTER — Telehealth: Payer: Self-pay | Admitting: Family Medicine

## 2016-01-17 ENCOUNTER — Ambulatory Visit (INDEPENDENT_AMBULATORY_CARE_PROVIDER_SITE_OTHER): Payer: BLUE CROSS/BLUE SHIELD | Admitting: Internal Medicine

## 2016-01-17 ENCOUNTER — Telehealth: Payer: Self-pay

## 2016-01-17 ENCOUNTER — Ambulatory Visit (INDEPENDENT_AMBULATORY_CARE_PROVIDER_SITE_OTHER): Payer: BLUE CROSS/BLUE SHIELD | Admitting: Podiatry

## 2016-01-17 VITALS — Ht 72.0 in | Wt 266.6 lb

## 2016-01-17 VITALS — BP 130/72 | HR 76 | Ht 72.0 in | Wt 266.2 lb

## 2016-01-17 DIAGNOSIS — R0600 Dyspnea, unspecified: Secondary | ICD-10-CM | POA: Diagnosis not present

## 2016-01-17 DIAGNOSIS — G4733 Obstructive sleep apnea (adult) (pediatric): Secondary | ICD-10-CM

## 2016-01-17 DIAGNOSIS — M779 Enthesopathy, unspecified: Secondary | ICD-10-CM

## 2016-01-17 DIAGNOSIS — B372 Candidiasis of skin and nail: Secondary | ICD-10-CM | POA: Diagnosis not present

## 2016-01-17 DIAGNOSIS — M7662 Achilles tendinitis, left leg: Secondary | ICD-10-CM

## 2016-01-17 DIAGNOSIS — Z1211 Encounter for screening for malignant neoplasm of colon: Secondary | ICD-10-CM

## 2016-01-17 MED ORDER — TERBINAFINE HCL 250 MG PO TABS: 250.0000 mg | ORAL_TABLET | Freq: Every day | ORAL | Status: DC

## 2016-01-17 MED ORDER — SUPREP BOWEL PREP KIT 17.5-3.13-1.6 GM/177ML PO SOLN: 1.0000 | Freq: Once | ORAL | Status: DC

## 2016-01-17 MED ORDER — NONFORMULARY OR COMPOUNDED ITEM: Status: DC

## 2016-01-17 MED ORDER — TRAMADOL HCL 50 MG PO TABS: 50.0000 mg | ORAL_TABLET | Freq: Three times a day (TID) | ORAL | Status: DC

## 2016-01-17 NOTE — Patient Instructions (Addendum)
Order- DME [facility]- change CPAP back to 10 fixed pressure dx OSA  Order- referral to orthodontist Dr Oneal Grout   Consider oral appliance  To trat OSA  Order- office spirometry    Dx dyspnea

## 2016-01-17 NOTE — Telephone Encounter (Signed)
Advise please on PA for medication.  I have looked and do not see a PA initiated

## 2016-01-17 NOTE — Telephone Encounter (Signed)
This pt has not been seen in the office. The Levsin was not prescribed by our office. Please contact the pt and let him know he needs to contact the prescribing office for the refill.

## 2016-01-17 NOTE — Progress Notes (Signed)
Subjective:     Patient ID: [name] Foster, male   DOB: 1966/05/24, 50 y.o.   MRN: [id number]  HPI patient presents stating he still is getting pain in his left heel and he need something to help him during the day so he can work   Review of Systems     Objective:   Physical Exam Neurovascular status intact muscle strength adequate with continued discomfort posterior medial aspect left heel at the Achilles tendon insertion. Patient's found to have good digital perfusion well oriented 3 at the current time and also has some dryness the skin and did not get his antifungal last visit    Assessment:     Chronic inflammatory tendinitis that so far as not responded to conservative injection immobilization treatment    Plan:     Continue with utilization of immobilization with boot and heat and ice therapy along with stretching exercises. Patient was scanned for orthotics to lift up the plantar heel and also lift up the arch and was given instructions on physical therapy. Patient will be seen back to recheck and I wrote him a prescription for tramadol to try to help with the pain and inflammation along with Lamisil for his scan 250 mg for 45 days and a topical pain cream to the back of the heel. Consider the possibility some day for aqua therapy if symptoms persist

## 2016-01-17 NOTE — Telephone Encounter (Signed)
Dr Hilarie Fredrickson,      Pt wants Levsin refill.                                   Thank you, Beretta Ginsberg/PV

## 2016-01-17 NOTE — Addendum Note (Signed)
Addended by: Harriett Sine D on: 01/17/2016 09:24 AM   Modules accepted: Orders

## 2016-01-17 NOTE — Progress Notes (Signed)
No allergies to eggs or soy No past problems with anesthesia No home oxygen No diet meds  Has email and internet; registered for emmi 

## 2016-01-17 NOTE — Progress Notes (Signed)
Subjective:    Patient ID: [name] Foster, male    DOB: Sep 30, 1965, 50 y.o.   MRN: [id number]  HPI  03/20/14- Dr Gwenette Greet Patient comes in today for followup of his obstructive sleep apnea. He has been trying to wear CPAP as much as he can, but is having issues with a feeling of claustrophobia. He is currently on a fixed pressure of 10 cm of water, and his download shows this is adequately controlling his apnea for the time that he has used the device. He is having no issues with mask fit or leaking.  05/10/15- 46 yoM never smoker following for OSA,UPPP, complicated by obesity, HBP,  NPSG 08/2012:  AHI 17/hr, cpap to 9cm but some breakthru CPAP 10/ [facility] Former [facility] patient; Pt states he wears CPAP occasionally-DME is [facility]. Pt states he to [facility] to get different mask but needed to have sleep study.  Pt also has noted trouble with breathing and chest tightness through out.  Had tonsils out, Had uvulopalatoplasty in 2003 He panics after about 3 hours his CPAP. Sleeps better in a recliner but not clear that this is positional. No known heart or lung problem. Told he had a heart murmur in the past.  01/17/2016-50 year old male never smoker followed for OSA/UPPP, complicated by obesity, HBP CPAP auto 8-15/[facility] > 10 Downloads document recent resumption of CPAP use with generally poor compliance and excellent control auto 8-15/[facility] FOLLOWS FOR: DME Is [facility]-pt has started back using CPAP but having hard time getting used to the machine. DL printed. Says he has been trying CPAP again but it is still uncomfortable. Doesn't like anything on his face. Wakes at night short of breath on auto titration mowed and says excessive pressure leaves chest sore the next day. He asks for a breathing test and would like to go back to fixed pressure 10.  ROS-see HPI   Negative unless "+" Constitutional:    weight loss, night sweats, fevers, chills, fatigue, lassitude. HEENT:    headaches, difficulty  swallowing, tooth/dental problems, sore throat,       sneezing, itching, ear ache, nasal congestion, post nasal drip, snoring CV:    chest pain, orthopnea, PND, swelling in lower extremities, anasarca,                                                    dizziness, palpitations Resp:   shortness of breath with exertion or at rest.                productive cough,   non-productive cough, coughing up of blood.              change in color of mucus.  wheezing.   Skin:    rash or lesions. GI:  No-   heartburn, indigestion, abdominal pain, nausea, vomiting, diarrhea,                 change in bowel habits, loss of appetite GU: dysuria, change in color of urine, no urgency or frequency.   flank pain. MS:   joint pain, stiffness, decreased range of motion, back pain. Neuro-     nothing unusual Psych:  change in mood or affect.  depression or anxiety.   memory loss.     Objective:  OBJ- Physical Exam General- Alert, Oriented, Affect-appropriate, Distress- none acute, + strabismus  Skin- rash-none, lesions- none, excoriation- none Lymphadenopathy- none Head- atraumatic            Eyes- Gross vision intact, PERRLA, conjunctivae and secretions clear, + strabismus            Ears- Hearing, canals-normal            Nose- Clear, no-Septal dev, mucus, polyps, erosion, perforation             Throat-+UPPP , mucosa clear , drainage- none, tonsils- atrophic Neck- flexible , trachea midline, no stridor , thyroid nl, carotid no bruit Chest - symmetrical excursion , unlabored           Heart/CV- RRR , no murmur heard , no gallop  , no rub, nl s1 s2                           - JVD- none , edema- none, stasis changes- none, varices- none           Lung- clear to P&A, wheeze- none, cough- none , dullness-none, rub- none           Chest wall-  Abd-  Br/ Gen/ Rectal- Not done, not indicated Extrem- cyanosis- none, clubbing, none, atrophy- none, strength- nl Neuro- grossly intact to observation       Assessment & Plan:

## 2016-01-17 NOTE — Telephone Encounter (Signed)
Pt's spouse called in to check the status of the medication override form that she faxed in last week. She says that her spouse insurance will not cover any GI medications because they are for the most part over the counter. Spouse would like a call back to confirm receipt of form because she says that her spouse needs his acid reflux medication badly.    CB: [phone]

## 2016-01-18 DIAGNOSIS — R0602 Shortness of breath: Secondary | ICD-10-CM | POA: Insufficient documentation

## 2016-01-18 DIAGNOSIS — R0600 Dyspnea, unspecified: Secondary | ICD-10-CM | POA: Insufficient documentation

## 2016-01-18 NOTE — Telephone Encounter (Signed)
LMOM (mobile number) for pt to call office of prescribing physician for Levsin refill.  Angela/PV

## 2016-01-18 NOTE — Telephone Encounter (Signed)
PA initiated on [email or web address], awaiting determination. JG//CMA 

## 2016-01-18 NOTE — Assessment & Plan Note (Signed)
Patient complains of waking in the morning with sore chest and mild dyspnea after sleeping with CPAP auto 8-15/[facility]. Exam is unremarkable and I think he is probably feeling some musculoskeletal chest wall pain related to chest inflation while wearing CPAP Office Spirometry

## 2016-01-18 NOTE — Assessment & Plan Note (Signed)
He has never been comfortable with CPAP. Asks to try changing back to fixed pressure 10. Current AutoPap setting range bases chest feeling sore from overdistention in the morning. We discussed oral appliances as an option for people who did not tolerate CPAP well.

## 2016-01-19 ENCOUNTER — Encounter: Payer: Self-pay | Admitting: Internal Medicine

## 2016-01-19 ENCOUNTER — Encounter: Payer: Self-pay | Admitting: *Deleted

## 2016-01-21 NOTE — Telephone Encounter (Signed)
Patient's wife called to check the status of PA please call back with an update [phone]

## 2016-01-21 NOTE — Telephone Encounter (Signed)
PA is being processed by insurance. There is no further information that I can provide to the pt. He will need to contact insurance. JG//CMA

## 2016-01-25 NOTE — Telephone Encounter (Signed)
So he needs this med for hiatal hernia with gastroesophageal reflux, will they not pay for any PPI for this diagnosis?

## 2016-01-25 NOTE — Telephone Encounter (Signed)
PA denied because the medication is not covered under pt's pharmacy benefit.  Any other medications for this diagnosis will also require a PA.  Please advise. JG//CMA

## 2016-01-26 NOTE — Telephone Encounter (Signed)
Patient's wife called very upset that no one has called her back regarding this issue. She said she has been going through this for over a month and no one has called her. Said someone should have thought about the patient and updated him on this process, she said she has been calling constantly requesting a return call and no one has called. Her husband is drinking vinegar for is reflux and she said she is going to be very upset if he ends up in the hospital. She then goes on to say well how much longer is it going to be because I have been calling for a over 3 weeks and all you show is I called last week. She said she use to work for [employer] and she knows that someone is suppose to call her back with in 72 hours of her call and no one is doing that here. She asked was Cresenciano Genre still over [place] because that is her next step since no one is calling her back. She then said do not worry I work right down the road I will be there tomorrow and she said I'm going to hang up right now because I'm getting upset she said it's not you Mikael Skoda it's this whole process.

## 2016-01-26 NOTE — Telephone Encounter (Signed)
Pt's spouse called back in to get a status update of medication. Informed her of the below. She is upset because she says that no one is keeping her updated on the status other than when she calls in. She request to speak with Glass blower/designer. Transferred call.     CB: [phone] ext [phone]

## 2016-01-26 NOTE — Telephone Encounter (Signed)
Insurance is requesting a letter of medical necessity from Dr. Charlett Blake. JG//CMA

## 2016-01-26 NOTE — Telephone Encounter (Signed)
This is unreal. He has a hiatal hernia with gastroesophageal reflux which is symptomatic, that is his letter of necessity. Please print that statement on a letter head and send it to the insurance I guess.   Can say patient has a symptomatic hiatal hernia with gastroesophageal reflux and has failed other management.  They need to lodge a complaint with their insurance, his insurance putting up this much obstruction for a simple medication that is clearly documented in his medical record is unethical, they do this deliberatly to save money and place the burden on [place].   This should not be necessary.

## 2016-01-27 ENCOUNTER — Telehealth: Payer: Self-pay

## 2016-01-27 NOTE — Telephone Encounter (Signed)
Letter has been printed is up front for patients wife to pick up.

## 2016-01-27 NOTE — Telephone Encounter (Signed)
Called Pharmacy to attempt to get the medication authorized which was denied on 01-25-16 and  they reported that they would not take a medical necessity letter for this medication due to it having an alternative otc medication.

## 2016-01-27 NOTE — Telephone Encounter (Signed)
Pt wife called in stating that she needs our office manager then said nevermind she will go to [place] who is over [employer]. She said she used to work for [employer] and knows our Nurse, mental health. She is mostly upset that the person handling PA did not communicate with her upon her calls to our office to find out the status of the PA. I talked to Shirlean Mylar and between her, Dr. Charlett Blake, and Gari Crown I was informed to tell the pts wife Dr. Charlett Blake wrote a letter at midnight last night. A copy will be at the front desk for her to pick up and they will send a copy to the insurance to see if they will approve the med. She said that was fine but was still contacting the head of [place].

## 2016-01-31 ENCOUNTER — Other Ambulatory Visit: Payer: Self-pay

## 2016-01-31 ENCOUNTER — Encounter: Payer: Self-pay | Admitting: Internal Medicine

## 2016-01-31 ENCOUNTER — Ambulatory Visit (AMBULATORY_SURGERY_CENTER): Payer: BLUE CROSS/BLUE SHIELD | Admitting: Internal Medicine

## 2016-01-31 VITALS — BP 138/77 | HR 81 | Temp 99.1°F | Resp 23 | Ht 72.0 in | Wt 266.0 lb

## 2016-01-31 DIAGNOSIS — K621 Rectal polyp: Secondary | ICD-10-CM

## 2016-01-31 DIAGNOSIS — K219 Gastro-esophageal reflux disease without esophagitis: Secondary | ICD-10-CM

## 2016-01-31 DIAGNOSIS — Z1211 Encounter for screening for malignant neoplasm of colon: Secondary | ICD-10-CM | POA: Diagnosis not present

## 2016-01-31 DIAGNOSIS — K209 Esophagitis, unspecified without bleeding: Secondary | ICD-10-CM

## 2016-01-31 DIAGNOSIS — D128 Benign neoplasm of rectum: Secondary | ICD-10-CM

## 2016-01-31 DIAGNOSIS — R0789 Other chest pain: Secondary | ICD-10-CM

## 2016-01-31 DIAGNOSIS — D129 Benign neoplasm of anus and anal canal: Secondary | ICD-10-CM

## 2016-01-31 MED ORDER — DEXLANSOPRAZOLE 60 MG PO CPDR: 60.0000 mg | DELAYED_RELEASE_CAPSULE | Freq: Every day | ORAL | Status: DC

## 2016-01-31 MED ORDER — SODIUM CHLORIDE 0.9 % IV SOLN: 500.0000 mL | INTRAVENOUS | Status: DC

## 2016-01-31 NOTE — Progress Notes (Signed)
Report given to PACU RN, vss 

## 2016-01-31 NOTE — Op Note (Signed)
[name] Patient Name: [name] Foster Procedure Date: 01/31/2016 3:24 PM MRN: [id number] Endoscopist: Jerene Bears , MD Age: 50 Referring MD:  Date of Birth: 06/23/1966 Gender: Male Procedure:                Upper GI endoscopy Indications:              Gastro-esophageal reflux disease, Chest pain (non                            cardiac) Medicines:                Monitored Anesthesia Care Procedure:                Pre-Anesthesia Assessment:                           - Prior to the procedure, a History and Physical                            was performed, and patient medications and                            allergies were reviewed. The patient's tolerance of                            previous anesthesia was also reviewed. The risks                            and benefits of the procedure and the sedation                            options and risks were discussed with the patient.                            All questions were answered, and informed consent                            was obtained. Prior Anticoagulants: The patient has                            taken no previous anticoagulant or antiplatelet                            agents. ASA Grade Assessment: II - A patient with                            mild systemic disease. After reviewing the risks                            and benefits, the patient was deemed in                            satisfactory condition to undergo the procedure.  After obtaining informed consent, the endoscope was                            passed under direct vision. Throughout the                            procedure, the patient's blood pressure, pulse, and                            oxygen saturations were monitored continuously. The                            Model GIF-HQ190 [id number]) scope was introduced                            through the mouth, and advanced to the second part       of duodenum. The upper GI endoscopy was                            accomplished without difficulty. The patient                            tolerated the procedure well. Scope In: Scope Out: Findings:                 LA Grade A (one or more mucosal breaks less than 5                            mm, not extending between tops of 2 mucosal folds)                            esophagitis was found in distal esophagus just                            proximal to the the gastroesophageal junction.                           The exam of the esophagus was otherwise normal.                           Multiple biopsies were obtained with cold forceps                            for evaluation of eosinophilic esophagitis randomly                            in the upper third of the esophagus, in the middle                            third of the esophagus and in the lower third of                            the esophagus.  The entire examined stomach was normal.                           The cardia and gastric fundus were normal on                            retroflexion.                           The examined duodenum was normal. Complications:            No immediate complications. Estimated Blood Loss:     Estimated blood loss: none. Impression:               - LA Grade A reflux esophagitis.                           - Normal stomach.                           - Normal examined duodenum.                           - Multiple biopsies were obtained in the upper                            third of the esophagus, in the middle third of the                            esophagus and in the lower third of the esophagus. Recommendation:           - Patient has a contact number available for                            emergencies. The signs and symptoms of potential                            delayed complications were discussed with the                            patient. Return to  normal activities tomorrow.                            Written discharge instructions were provided to the                            patient.                           - Resume previous diet.                           - Continue present medications.                           - Await pathology results.                           -  Follow an antireflux regimen.                           - Do not lie down for at least 3 to 4 hours after                            meals.                           - Raise the head of the bed 4 to 6 inches.                           - Decrease excess weight.                           - Avoid citrus juices and other acidic foods,                            alcohol, chocolate, mints, coffee and other                            caffeinated beverages, carbonated beverages, fatty                            and fried foods.                           - Avoid tight-fitting clothing.                           - Avoid cigarettes and other tobacco products.                           - Perform esophageal manometry with 24 hour                            ambulatory pH and impedance monitoring at                            appointment to be scheduled (perform study on PPI). Jerene Bears, MD 01/31/2016 4:01:55 PM This report has been signed electronically.

## 2016-01-31 NOTE — Progress Notes (Signed)
Called to room to assist during endoscopic procedure.  Patient ID and intended procedure confirmed with present staff. Received instructions for my participation in the procedure from the performing physician.  

## 2016-01-31 NOTE — Telephone Encounter (Signed)
Wife called back.  She's very upset because her husband still has not received his medication yet and she has not heard back from anyone regarding an update.  Wife states that she's going to [facility], because this is just ridiculous.  Her husband has been self treating with vinegar, because he has been without his medication.  Per wife, the PA should not be for Nexium, it should be for Dexilant. Per wife this is the only medication that works for her husband.    Please advise.

## 2016-01-31 NOTE — Patient Instructions (Addendum)
YOU HAD AN ENDOSCOPIC PROCEDURE TODAY AT [facility]:   Refer to the procedure report that was given to you for any specific questions about what was found during the examination.  If the procedure report does not answer your questions, please call your gastroenterologist to clarify.  If you requested that your care partner not be given the details of your procedure findings, then the procedure report has been included in a sealed envelope for you to review at your convenience later.  YOU SHOULD EXPECT: Some feelings of bloating in the abdomen. Passage of more gas than usual.  Walking can help get rid of the air that was put into your GI tract during the procedure and reduce the bloating. If you had a lower endoscopy (such as a colonoscopy or flexible sigmoidoscopy) you may notice spotting of blood in your stool or on the toilet paper. If you underwent a bowel prep for your procedure, you may not have a normal bowel movement for a few days.  Please Note:  You might notice some irritation and congestion in your nose or some drainage.  This is from the oxygen used during your procedure.  There is no need for concern and it should clear up in a day or so.  SYMPTOMS TO REPORT IMMEDIATELY:   Following lower endoscopy (colonoscopy or flexible sigmoidoscopy):  Excessive amounts of blood in the stool  Significant tenderness or worsening of abdominal pains  Swelling of the abdomen that is new, acute  Fever of 100F or higher   Following upper endoscopy (EGD)  Vomiting of blood or coffee ground material  New chest pain or pain under the shoulder blades  Painful or persistently difficult swallowing  New shortness of breath  Fever of 100F or higher  Black, tarry-looking stools  For urgent or emergent issues, a gastroenterologist can be reached at any hour by calling [phone].   DIET: Your first meal following the procedure should be a small meal and then it is ok to progress to  your normal diet. Heavy or fried foods are harder to digest and may make you feel nauseous or bloated.  Likewise, meals heavy in dairy and vegetables can increase bloating.  Drink plenty of fluids but you should avoid alcoholic beverages for 24 hours.  Try to eat a high fiber diet, and drink plenty of water.  ACTIVITY:  You should plan to take it easy for the rest of today and you should NOT DRIVE or use heavy machinery until tomorrow (because of the sedation medicines used during the test).    FOLLOW UP: Our staff will call the number listed on your records the next business day following your procedure to check on you and address any questions or concerns that you may have regarding the information given to you following your procedure. If we do not reach you, we will leave a message.  However, if you are feeling well and you are not experiencing any problems, there is no need to return our call.  We will assume that you have returned to your regular daily activities without incident.  If any biopsies were taken you will be contacted by phone or by letter within the next 1-3 weeks.  Please call us at [phone] if you have not heard about the biopsies in 3 weeks.    SIGNATURES/CONFIDENTIALITY: You and/or your care partner have signed paperwork which will be entered into your electronic medical record.  These signatures attest to the fact  that that the information above on your After Visit Summary has been reviewed and is understood.  Full responsibility of the confidentiality of this discharge information lies with you and/or your care-partner.  Read all of the handouts given to you by your recovery room nurse.  Thank-you for choosing [facility] for your healthcare needs today.  The 3rd floor staff will call you to arrange the ph probe test.

## 2016-01-31 NOTE — Telephone Encounter (Signed)
Called pharmacy to follow up.  Maudie Mercury, pharm tech say Dexilant needs a PA and form will be faxed over to office before the end of the day.

## 2016-01-31 NOTE — Telephone Encounter (Signed)
Spoke with dr. Charlett Blake and she ok the refill for dexilant order has been placed.

## 2016-01-31 NOTE — Op Note (Signed)
[facility] Patient Name: [name] Foster Procedure Date: 01/31/2016 3:24 PM MRN: [id number] Endoscopist: Jerene Bears , MD Age: 50 Referring MD:  Date of Birth: 1966/02/22 Gender: Male Procedure:                Colonoscopy Indications:              Screening for colorectal malignant neoplasm, This                            is the patient's first colonoscopy Medicines:                Monitored Anesthesia Care Procedure:                Pre-Anesthesia Assessment:                           - Prior to the procedure, a History and Physical                            was performed, and patient medications and                            allergies were reviewed. The patient's tolerance of                            previous anesthesia was also reviewed. The risks                            and benefits of the procedure and the sedation                            options and risks were discussed with the patient.                            All questions were answered, and informed consent                            was obtained. Prior Anticoagulants: The patient has                            taken no previous anticoagulant or antiplatelet                            agents. ASA Grade Assessment: II - A patient with                            mild systemic disease. After reviewing the risks                            and benefits, the patient was deemed in                            satisfactory condition to undergo the procedure.  After obtaining informed consent, the colonoscope                            was passed under direct vision. Throughout the                            procedure, the patient's blood pressure, pulse, and                            oxygen saturations were monitored continuously. The                            Model CF-HQ190L [id number]) scope was introduced                            through the anus and advanced to the the cecum,                         identified by appendiceal orifice and ileocecal                            valve. The colonoscopy was performed without                            difficulty. The patient tolerated the procedure                            well. The quality of the bowel preparation was                            good. The ileocecal valve, appendiceal orifice, and                            rectum were photographed. Scope In: 3:43:39 PM Scope Out: 3:54:45 PM Scope Withdrawal Time: 0 hours 9 minutes 16 seconds  Total Procedure Duration: 0 hours 11 minutes 6 seconds  Findings:                 A 4 mm polyp was found in the rectum. The polyp was                            sessile. The polyp was removed with a cold snare.                            Resection and retrieval were complete.                           Scattered small-mouthed diverticula were found from                            ascending colon to sigmoid colon.                           Internal hemorrhoids were found during  retroflexion. The hemorrhoids were small. Complications:            No immediate complications. Estimated Blood Loss:     Estimated blood loss was minimal. Impression:               - One 4 mm polyp in the rectum, removed with a cold                            snare. Resected and retrieved.                           - Mild diverticulosis from ascending colon to                            sigmoid colon.                           - Small internal hemorrhoids. Recommendation:           - Patient has a contact number available for                            emergencies. The signs and symptoms of potential                            delayed complications were discussed with the                            patient. Return to normal activities tomorrow.                            Written discharge instructions were provided to the                            patient.                            - Resume previous diet.                           - Continue present medications.                           - Await pathology results.                           - Repeat colonoscopy is recommended. The                            colonoscopy date will be determined after pathology                            results from today's exam become available for                            review. Jerene Bears, MD 01/31/2016 4:04:02 PM This report has been signed electronically.

## 2016-01-31 NOTE — Telephone Encounter (Signed)
Called to give wife an update.  She was very appreciative for the call.  She says that patient just had an Endoscopy and Dr. Hilarie Fredrickson says that he would call insurance company personally to see if he can expedite PA.  Wife says we do not have to follow up, that Dr. Hilarie Fredrickson will take care of it.  No further questions or concerns voiced at this time.

## 2016-02-01 ENCOUNTER — Encounter: Payer: Self-pay | Admitting: *Deleted

## 2016-02-01 ENCOUNTER — Telehealth: Payer: Self-pay | Admitting: *Deleted

## 2016-02-01 NOTE — Telephone Encounter (Signed)
Left message on f/u call 

## 2016-02-04 ENCOUNTER — Telehealth: Payer: Self-pay | Admitting: Family Medicine

## 2016-02-04 ENCOUNTER — Ambulatory Visit: Payer: BLUE CROSS/BLUE SHIELD | Admitting: Family Medicine

## 2016-02-04 ENCOUNTER — Encounter: Payer: Self-pay | Admitting: Internal Medicine

## 2016-02-04 ENCOUNTER — Telehealth: Payer: Self-pay | Admitting: Internal Medicine

## 2016-02-04 NOTE — Telephone Encounter (Signed)
Vaughan Basta- Dr Hilarie Fredrickson said he had you setting up an esophagael mano for patient??? Prior authorization for Dexilant was submitted through Cover My Meds on 02/01/16. I contacted patient's insurance today and spoke with Joycelyn Das. To get a status update. He said decision is still not made. I asked for expedited review (72 hours or less) and he states that he will do this.

## 2016-02-08 ENCOUNTER — Telehealth: Payer: Self-pay | Admitting: *Deleted

## 2016-02-08 NOTE — Telephone Encounter (Signed)
Lansoprazole 30 mg BID-AC He should remain ON PPI therapy during his upcoming pH and 24hr impedence study

## 2016-02-08 NOTE — Telephone Encounter (Signed)
Dr Hilarie Fredrickson- I attempted prior authorization for Dexilant as you requested. I indicated to patient's insurance ([employer]) that he has GERD with esophagitis and has tried Nexium, Protonix, omeprazole, Zegerid, ranitidine and carafate. Unfortunately, they have still denied coverage for Dexilant. "At this time, the requested drug is not covered under your pharmacy benefit. This can be found in the exclusions section..." Dr Hilarie Fredrickson, please advise.Marland KitchenMarland Kitchen

## 2016-02-08 NOTE — Telephone Encounter (Signed)
Left voicemail for patient to call back. 

## 2016-02-09 MED ORDER — LANSOPRAZOLE 30 MG PO CPDR: 30.0000 mg | DELAYED_RELEASE_CAPSULE | Freq: Two times a day (BID) | ORAL | Status: DC

## 2016-02-09 NOTE — Telephone Encounter (Signed)
I have spoken to patient's wife and have advised of Dr Vena Rua recommendations. She verbalizes understanding. New rx for lansoprazole sent.

## 2016-02-10 NOTE — Telephone Encounter (Signed)
Pt was no show for cpe 02/04/16, 1st no show w/in 1 year, pt has not rescheduled, charge or no charge?

## 2016-02-10 NOTE — Telephone Encounter (Signed)
No charge, he may have transferred so he will call back if he wants an appt

## 2016-02-10 NOTE — Telephone Encounter (Signed)
Pt scheduled for EM at [facility] 02/28/16@8 :30am, pt to arrive at [facility] at [place]. Per Dr. Hilarie Fredrickson test to be done while pt on PPI's. Prep instructions mailed to pt. Left message for pt to call back.

## 2016-02-11 NOTE — Telephone Encounter (Signed)
Pt aware of appt.

## 2016-02-15 ENCOUNTER — Ambulatory Visit: Payer: BLUE CROSS/BLUE SHIELD | Admitting: *Deleted

## 2016-02-15 DIAGNOSIS — M779 Enthesopathy, unspecified: Secondary | ICD-10-CM

## 2016-02-15 NOTE — Progress Notes (Signed)
Patient ID: [name] Foster, male   DOB: 07-25-1966, 50 y.o.   MRN: [id number] Patient presents for orthotic pick up.  Verbal and written break in and wear instructions given.  Patient will follow up in 4 weeks if symptoms worsen or fail to improve.

## 2016-02-15 NOTE — Patient Instructions (Signed)

## 2016-02-17 ENCOUNTER — Telehealth: Payer: Self-pay | Admitting: Internal Medicine

## 2016-02-17 NOTE — Telephone Encounter (Signed)
Discussed with wife that our office has not called him today. Wife states it was the urologist office that called.

## 2016-02-18 NOTE — Telephone Encounter (Signed)
Patient's lansoprazole prior authorization has also been denied by [facility]. It states that "the requested drug must be covered under your pharmacy benefit. At this time, the requested drug is not covered under your pharmacy benefit." I am not sure what other PPI to try since he has tried a multitude of them and the others we are choosing all seem to be denied by his insurance company.

## 2016-02-21 NOTE — Telephone Encounter (Signed)
Pt has tried multiple PPIs without help (nexium, omeprazole, pantoprazole, zegerid).  Also ranitidine and carafate He has reflux esophagitis which needs therapy He also needs to be on PPI at time of esophageal pH and impedence testing Please ask him to contact his insurance carrier and investigate which PPI is covered by his pharmacy benefit. He needs to do this ASAP so that we can get him on treatment

## 2016-02-22 NOTE — Telephone Encounter (Signed)
I have left a voicemail for patient to call back. 

## 2016-02-22 NOTE — Telephone Encounter (Signed)
Left voicemail for patient to call back. 

## 2016-02-24 NOTE — Telephone Encounter (Addendum)
Left another voicemail for patient to return my call. He needs to be on PPI for manometry (which now needs to be rescheduled).

## 2016-02-25 ENCOUNTER — Telehealth: Payer: Self-pay | Admitting: Internal Medicine

## 2016-02-25 NOTE — Telephone Encounter (Signed)
Wife had questions regarding calling the insurance company regarding PPI. Questions were answered.

## 2016-02-25 NOTE — Telephone Encounter (Signed)
Patient's wife (ok to talk to per Chart) has been advised that we need them to find out which PPI insurance will cover since they do not seem to want to cover anything we send in. Patient is currently on OTC Nexium 20 mg-2 tablets daily and ranitidine. Therefore, he is still okay to have 24 hour ph/impedence study. Wife states that they will call insurance and then call us back.

## 2016-02-28 ENCOUNTER — Ambulatory Visit (HOSPITAL_COMMUNITY)
Admission: RE | Admit: 2016-02-28 | Discharge: 2016-02-28 | Disposition: A | Discharge status: Home / Self Care | Payer: BLUE CROSS/BLUE SHIELD | Source: Ambulatory Visit | Attending: Internal Medicine | Admitting: Internal Medicine

## 2016-02-28 ENCOUNTER — Encounter (HOSPITAL_COMMUNITY)
Admission: RE | Disposition: A | Discharge status: Home / Self Care | Payer: Self-pay | Source: Ambulatory Visit | Attending: Internal Medicine

## 2016-02-28 DIAGNOSIS — K219 Gastro-esophageal reflux disease without esophagitis: Secondary | ICD-10-CM | POA: Diagnosis not present

## 2016-02-28 DIAGNOSIS — R0789 Other chest pain: Secondary | ICD-10-CM | POA: Insufficient documentation

## 2016-02-28 HISTORY — PX: ESOPHAGEAL MANOMETRY: SHX5429

## 2016-02-28 HISTORY — PX: 24 HOUR PH STUDY: SHX5419

## 2016-02-28 SURGERY — MANOMETRY, ESOPHAGUS
Anesthesia: Topical

## 2016-02-28 MED ORDER — LIDOCAINE VISCOUS 2 % MT SOLN
OROMUCOSAL | Status: AC
  Filled 2016-02-28: qty 15

## 2016-02-28 SURGICAL SUPPLY — 2 items
FACESHIELD LNG OPTICON STERILE (SAFETY) IMPLANT
GLOVE BIO SURGEON STRL SZ8 (GLOVE) ×4 IMPLANT

## 2016-02-28 NOTE — Progress Notes (Signed)
Esophageal Manometry done per protocol. Pt tolerated well without complication.  PH Impedance probe inserted per protocol at 37 cm which is 5cm above LES upper border. Pt tolerated well. Catheter calibrated and checked. Pt instructed on use of recorder using teachback and verbalized understanding. Pt will return tomorrow 02/29/2016 at or after 0930 to have [place] probe removed and study downloaded. Reports to be sent to Dr. Hilarie Fredrickson.

## 2016-02-29 ENCOUNTER — Encounter (HOSPITAL_COMMUNITY): Payer: Self-pay | Admitting: Internal Medicine

## 2016-03-06 ENCOUNTER — Encounter (HOSPITAL_COMMUNITY): Payer: Self-pay | Admitting: Internal Medicine

## 2016-03-06 ENCOUNTER — Ambulatory Visit (HOSPITAL_COMMUNITY)
Admission: RE | Admit: 2016-03-06 | Discharge: 2016-03-06 | Disposition: A | Discharge status: Home / Self Care | Payer: BLUE CROSS/BLUE SHIELD | Source: Ambulatory Visit | Attending: Internal Medicine | Admitting: Internal Medicine

## 2016-03-06 VITALS — BP 134/94 | HR 85 | Ht 72.0 in | Wt 263.0 lb

## 2016-03-06 DIAGNOSIS — I10 Essential (primary) hypertension: Secondary | ICD-10-CM | POA: Diagnosis present

## 2016-03-06 DIAGNOSIS — K760 Fatty (change of) liver, not elsewhere classified: Secondary | ICD-10-CM | POA: Insufficient documentation

## 2016-03-06 DIAGNOSIS — M7752 Other enthesopathy of left foot: Secondary | ICD-10-CM | POA: Diagnosis not present

## 2016-03-06 DIAGNOSIS — Z6835 Body mass index (BMI) 35.0-35.9, adult: Secondary | ICD-10-CM | POA: Diagnosis not present

## 2016-03-06 DIAGNOSIS — K224 Dyskinesia of esophagus: Secondary | ICD-10-CM | POA: Insufficient documentation

## 2016-03-06 DIAGNOSIS — K210 Gastro-esophageal reflux disease with esophagitis: Secondary | ICD-10-CM | POA: Diagnosis not present

## 2016-03-06 DIAGNOSIS — R3911 Hesitancy of micturition: Secondary | ICD-10-CM | POA: Diagnosis not present

## 2016-03-06 DIAGNOSIS — E669 Obesity, unspecified: Secondary | ICD-10-CM | POA: Insufficient documentation

## 2016-03-06 DIAGNOSIS — Z7982 Long term (current) use of aspirin: Secondary | ICD-10-CM | POA: Diagnosis not present

## 2016-03-06 DIAGNOSIS — K449 Diaphragmatic hernia without obstruction or gangrene: Secondary | ICD-10-CM | POA: Diagnosis not present

## 2016-03-06 DIAGNOSIS — N528 Other male erectile dysfunction: Secondary | ICD-10-CM | POA: Diagnosis not present

## 2016-03-06 DIAGNOSIS — F40240 Claustrophobia: Secondary | ICD-10-CM | POA: Insufficient documentation

## 2016-03-06 DIAGNOSIS — E782 Mixed hyperlipidemia: Secondary | ICD-10-CM | POA: Diagnosis not present

## 2016-03-06 DIAGNOSIS — G4733 Obstructive sleep apnea (adult) (pediatric): Secondary | ICD-10-CM | POA: Insufficient documentation

## 2016-03-06 DIAGNOSIS — R079 Chest pain, unspecified: Secondary | ICD-10-CM | POA: Diagnosis not present

## 2016-03-06 DIAGNOSIS — R011 Cardiac murmur, unspecified: Secondary | ICD-10-CM | POA: Insufficient documentation

## 2016-03-06 DIAGNOSIS — Z8719 Personal history of other diseases of the digestive system: Secondary | ICD-10-CM | POA: Diagnosis not present

## 2016-03-06 NOTE — Patient Instructions (Signed)
Will schedule you for a CT chest with calcium scoring at [facility].  Follow up 1 year with Dr. Haroldine Laws.  Do the following things EVERYDAY: 1) Weigh yourself in the morning before breakfast. Write it down and keep it in a log. 2) Take your medicines as prescribed 3) Eat low salt foods-Limit salt (sodium) to 2000 mg per day.  4) Stay as active as you can everyday 5) Limit all fluids for the day to less than 2 liters

## 2016-03-06 NOTE — Progress Notes (Signed)
Patient ID: [name] Foster, male   DOB: November 30, 1965, 50 y.o.   MRN: [id number]    Advanced Heart Failure Clinic Note   PCP: [name] Homans, MD  HPI:  [name] Foster is 50 y/o male (husband of [name] Foster) with HTN, HL, OSA here for f/u on chest pain and cardiac risk factors. He has been found to have GERD with hypomotile esophagus and lower esophageal sphincter incompetence.    2011 ECHO EF 123456 grade 2 diastolic dysfunction. Also had normal ETT.  11/13: Cardiac CT. Normal cors. Ca++ score = 0.  Echo 07/2015 LVEF 55-60%, Grade 1 DD  Had EGD and RUQ u/s in 12/13. Both normal except for fatty liver.   He presents today for regular follow up.  Continues to have stable pattern of chest pain, that occurs at night when lying in bed and after meals. Chronic for several years. Recent EGD with reflux esophagitis. He is followed by GI for severe GERD and esophageal dysmotility. Denies exertional chest pain.  States when he is upright or sitting, after eating or drinking he has a lot of pressure with some tightness across his chest, which is different from his GERD, which is more up and down. Did take his meds this morning.  SBP in 130-140s. 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 Not very active otherwise.  He doesn't recall having any problem being on atorvastatin in the past, unsure why it was changed.   Labs (2/15): LDL 115, HDL 42, K 3.7, creatinine 0.93 Labs (1/17): K 3.9, Creatinine 1.00  ECG: NSR, normal  ROS: All systems negative except as listed in HPI, PMH and Problem List.  Past Medical History  Diagnosis Date  . Mixed hyperlipidemia   . Hypertension   . Bone spur     left foot  . OSA (obstructive sleep apnea)     s/p UPPP  . Murmur   . Stomach ulcer     from PCP  . Erectile dysfunction 01/15/2013  . Esophageal reflux 01/15/2013  . Hyperglycemia 07/13/2013  . Hiatal hernia   . GERD (gastroesophageal reflux disease)   . Hiatal hernia with gastroesophageal reflux 03/20/2014    . Fatty liver 07/04/2015  . Urinary hesitancy 09/30/2015  . Reflux esophagitis     Current Outpatient Prescriptions  Medication Sig Dispense Refill  . acetaminophen (TYLENOL) 500 MG tablet Take 1 tablet (500 mg total) by mouth every 6 (six) hours as needed for pain. 60 tablet 0  . amoxicillin-clavulanate (AUGMENTIN) 875-125 MG tablet Take 1 tablet by mouth 2 (two) times daily.  0  . aspirin EC 81 MG tablet Take 81 mg by mouth daily.    . fluticasone (FLONASE) 50 MCG/ACT nasal spray Place 2 sprays into both nostrils daily. 16 g 6  . lansoprazole (PREVACID) 30 MG capsule Take 1 capsule (30 mg total) by mouth 2 (two) times daily before a meal. 60 capsule 3  . Multiple Vitamin (MULTIVITAMIN) tablet Take 1 tablet by mouth daily.    . pravastatin (PRAVACHOL) 20 MG tablet Take 1 tablet (20 mg total) by mouth daily. 30 tablet 5  . tadalafil (CIALIS) 5 MG tablet Take 1 tablet (5 mg total) by mouth daily as needed for erectile dysfunction. 30 tablet 5  . terbinafine (LAMISIL) 250 MG tablet Take 1 tablet (250 mg total) by mouth daily. 45 tablet 0  . triamterene-hydrochlorothiazide (MAXZIDE-25) 37.5-25 MG per tablet Take 1 tablet by mouth daily. 90 tablet 1  . hyoscyamine (LEVSIN SL) 0.125 MG  SL tablet Place 1 tablet (0.125 mg total) under the tongue every 4 (four) hours as needed for cramping. (Patient not taking: Reported on 01/31/2016) 40 tablet 1  . traMADol (ULTRAM) 50 MG tablet Take 1 tablet (50 mg total) by mouth 3 (three) times daily. (Patient not taking: Reported on 01/31/2016) 90 tablet 2   No current facility-administered medications for this encounter.   PHYSICAL EXAM: Filed Vitals:   03/06/16 1406  BP: 134/94  Pulse: 85  Height: 6' (1.829 m)  Weight: 263 lb (119.296 kg)  SpO2: 95%   Wt Readings from Last 3 Encounters:  03/06/16 263 lb (119.296 kg)  01/31/16 266 lb (120.657 kg)  01/17/16 266 lb 9.6 oz (120.929 kg)   General:  Well appearing. No resp difficulty HEENT: normal Neck:  supple. JVP flat. Carotids 2+ bilaterally; no bruits. No thyromegaly or nodule noted Cor: PMI normal. RRR. No M/G/R Lungs: CTAB, normal effort Abdomen: soft, obese, NT, ND, no HSM. No bruits or masses. +BS  Extremities: no cyanosis, clubbing, rash, edema Neuro: alert & orientedx3, cranial nerves grossly intact. Moves all 4 extremities w/o difficulty. Affect pleasant.  ASSESSMENT & PLAN: 1. Chest pain: Patient had normal coronary CT in 11/13.  Suspect that his chronic chest pain pattern is due to GERD.  He has severe GERD with esophageal dysmotility and is followed closely by GI.  He is on Nexium.  - EGD last month with reflux esophagitis.  - Can consider repeat coronary calcium scoring without angiography, though don't have high suspicion for ACS. 2. OSA:  - Still has feeling of claustrophobia when wearing CPAP, followed by pulmonary.  - CPAP settings adjusted last month and seems to have helped.  3. HTN:  - Mildly elevated. Says SBP mostly in 130-140s. Will discuss with PCP; could consider amlodipine.  4. Hyperlipidemia:  -  Would like to see on lipitor or crestor. If can't tolerate would need to be on Zetia.   Encouraged to increase activity.   [name] Friar, PA-C 03/06/2016   Patient seen and examined with [name] Kilts, PA-C. We discussed all aspects of the encounter. I agree with the assessment and plan as stated above.   Chest pain seems to be treated to GERD. CT coronary angiography in 2013 with normal cors. Given significant CRFs and recurrent CP will repeat coronary calcium scoring if score remains ) then will be reassuring but if score going up may need further testing. BP and lipids both uncontrolled. Stressed need for more acitivty and weight loss.   Would recommend: 1) switch to higher potency statin (atorva 40) 2) consider addition of amlodipine to get SBP < 130   Will send note to Dr. Randel Foster and leave these changes to her discretion given her close involvement in  his care.   [name] Taney,MD 10:30 PM

## 2016-03-07 ENCOUNTER — Telehealth: Payer: Self-pay | Admitting: *Deleted

## 2016-03-07 NOTE — Telephone Encounter (Addendum)
Pt states he has not received cream he paid for.  I reviewed pt's chart and medication history and Dr. Paulla Dolly had ordered [employer] Achilles Tendonitis Cream on 01/17/2016.  I spoke with Melissa at [facility], she states the rx had been held because the insurance would not cover, cost of Achilles tendonitis cream 30gram for over $100.00, Authorized Substitute Pain Cream Formulation 120 gram is $38.50.  I authorized the substitute cream and informed pt of the status and cost, instructed him to speak with Mliss Sax and gave phone number. 03/10/2016-Pt's wife, [name] states pt is having significant pain in his foot even when in the boot, and would like an rx for Ibuprofen 800mg . Dr. Paulla Dolly states check pt to see if he has take Ibuprofen 800mg  before without GI problem, if no problems then may order the Ibuprofen.  I spoke with pt and he said he has used without any stomach problems. Orders to [facility]

## 2016-03-09 ENCOUNTER — Telehealth: Payer: Self-pay | Admitting: *Deleted

## 2016-03-09 DIAGNOSIS — R0789 Other chest pain: Secondary | ICD-10-CM | POA: Insufficient documentation

## 2016-03-09 DIAGNOSIS — K219 Gastro-esophageal reflux disease without esophagitis: Secondary | ICD-10-CM | POA: Insufficient documentation

## 2016-03-09 NOTE — Telephone Encounter (Signed)
-----   Message from Jerene Bears, MD sent at 03/09/2016  1:24 PM EDT ----- Regarding: Mano and pH results Please inform patient of the following:   Study shows normal esophageal motility and function No significant acid reflux during the 24 pH study while he was ON PPI. Continue PPI, which is controlling reflux If symptoms of heartburn or atypical chest pain persist, we can other treatment options aimed at reducing the this sensation (i.e. Treatment of functional heartburn). If symptoms persist he should followup with me in the office to discuss other treatment options  JMP

## 2016-03-09 NOTE — Telephone Encounter (Signed)
Patient has been advised of results and recommendations as per Dr Vena Rua note. Patient states that he has been on medication for years and is still hurting. States, "[name] Foster got to have something done!" Patient has been scheduled for next available office appointment on 05/17/16 but would like suggestions in the meantime.Marland KitchenMarland KitchenMarland Kitchen

## 2016-03-09 NOTE — Telephone Encounter (Signed)
Trial of amitriptyline 50 mg qhs (this med can make him sleepy). Take at bedtime Continue PPI

## 2016-03-10 ENCOUNTER — Telehealth: Payer: Self-pay | Admitting: Internal Medicine

## 2016-03-10 MED ORDER — IBUPROFEN 800 MG PO TABS: 800.0000 mg | ORAL_TABLET | Freq: Three times a day (TID) | ORAL | Status: DC | PRN

## 2016-03-10 MED ORDER — AMITRIPTYLINE HCL 50 MG PO TABS: 50.0000 mg | ORAL_TABLET | Freq: Every day | ORAL | Status: DC

## 2016-03-10 NOTE — Telephone Encounter (Signed)
See 03-09-16 telephone note.

## 2016-03-10 NOTE — Telephone Encounter (Signed)
I have spoken to patient and have advised of Dr Vena Rua recommendations. He verbalizes understanding. Rx sent to pharmacy.

## 2016-03-13 ENCOUNTER — Ambulatory Visit: Payer: BLUE CROSS/BLUE SHIELD | Admitting: Podiatry

## 2016-03-29 ENCOUNTER — Ambulatory Visit (INDEPENDENT_AMBULATORY_CARE_PROVIDER_SITE_OTHER): Payer: BLUE CROSS/BLUE SHIELD | Admitting: Podiatry

## 2016-03-29 DIAGNOSIS — M722 Plantar fascial fibromatosis: Secondary | ICD-10-CM | POA: Diagnosis not present

## 2016-03-29 DIAGNOSIS — M7662 Achilles tendinitis, left leg: Secondary | ICD-10-CM | POA: Diagnosis not present

## 2016-03-29 MED ORDER — PREDNISONE 10 MG PO TABS: ORAL_TABLET | ORAL | Status: DC

## 2016-03-29 MED ORDER — TRIAMCINOLONE ACETONIDE 10 MG/ML IJ SUSP
10.0000 mg | Freq: Once | INTRAMUSCULAR | Status: AC
  Administered 2016-03-29: 10 mg

## 2016-03-29 NOTE — Progress Notes (Signed)
Subjective:     Patient ID: [name] Foster, male   DOB: 24-Oct-1965, 50 y.o.   MRN: [id number]  HPI patient states she still having a lot of pain in the left heel in the back and also is starting to develop pain in the bottom   Review of Systems     Objective:   Physical Exam Neurovascular status intact muscle strength adequate with patient found to have discomfort in the plantar aspect of the left heel with inflammation fluid buildup and is also continuing to have discomfort in the posterior heel on the medial lateral and central side of the Achilles insertion    Assessment:     Achilles tendinitis still present left with what appears to be a fasciitis plantar condition    Plan:     H&P condition reviewed and did careful plantar injection 3 mg Kenalog 5 mg Xylocaine and advised on physical therapy and the consideration for some type procedure or posteriorly depending on response

## 2016-04-09 IMAGING — US US ABDOMEN COMPLETE
1 series · 13 of 25 positions shown · non-contrast
Comparison: Abdominal ultrasound 08/30/2012.

CLINICAL DATA: 49-year-old male with epigastric pain radiating into
the middle of the back after meals for the past 9 months.

EXAM:
ULTRASOUND ABDOMEN COMPLETE

[Series 1: us abdomen complete · 0.21mm/px · 13 of 89 slices shown]
[im 1/89]
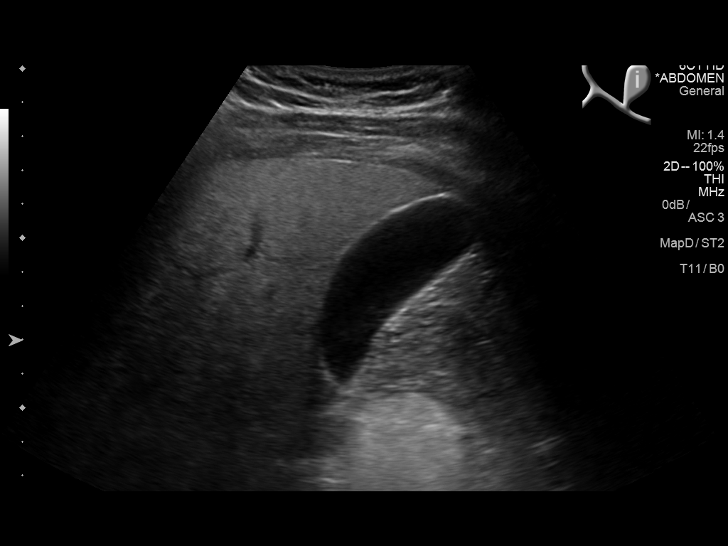
[im 8/89]
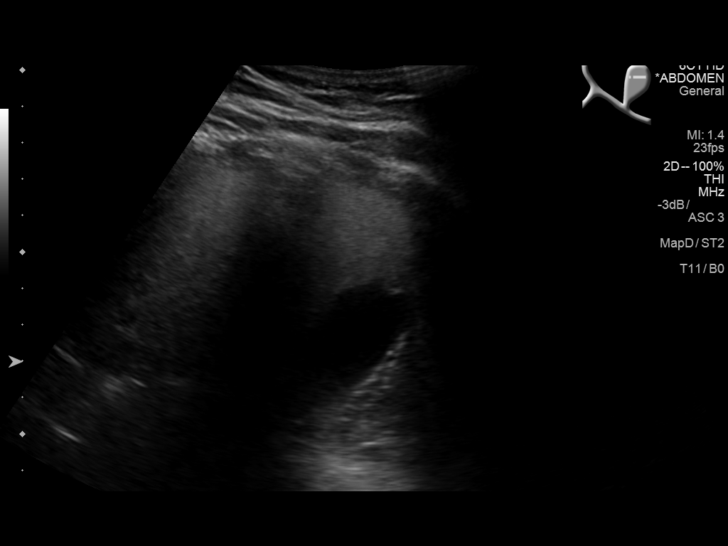
[im 15/89]
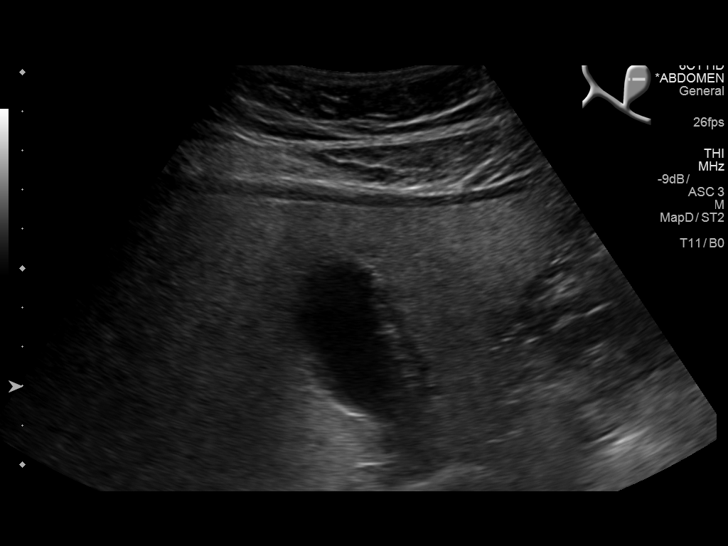
[im 23/89]
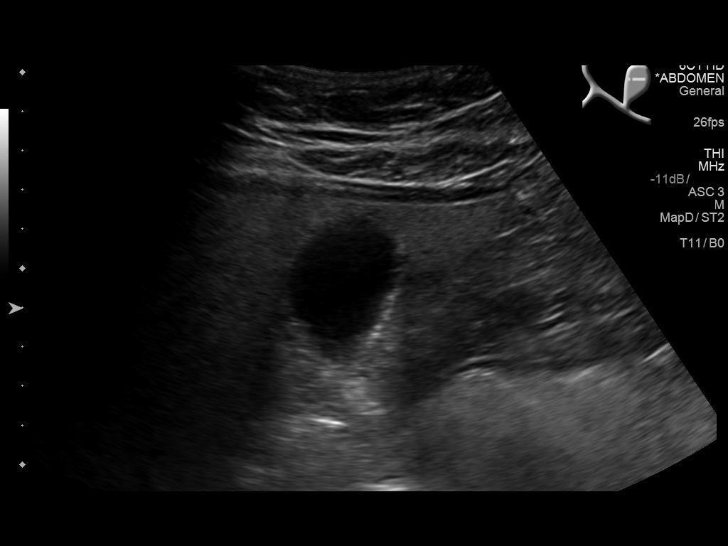
[im 30/89]
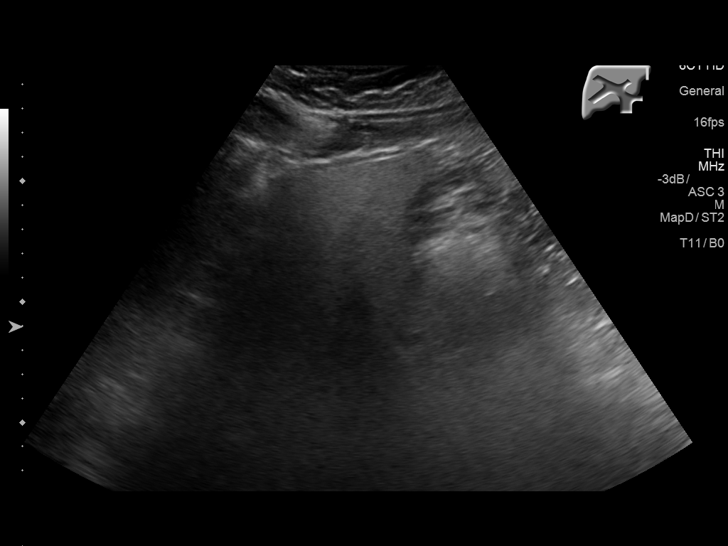
[im 37/89]
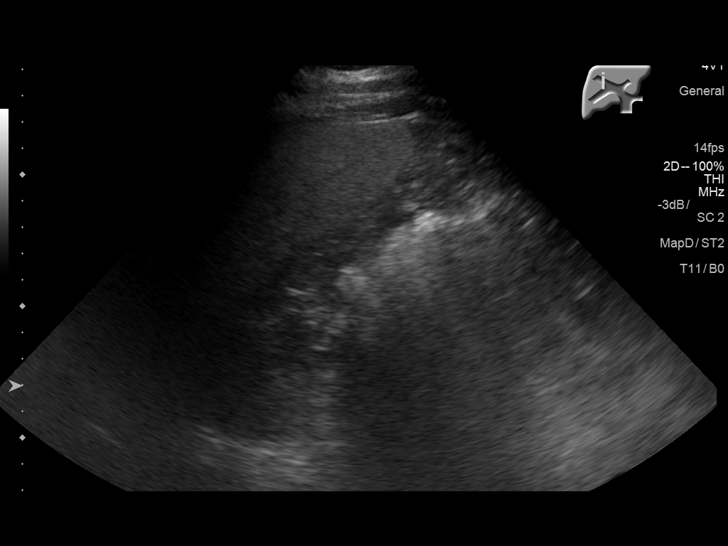
[im 45/89]
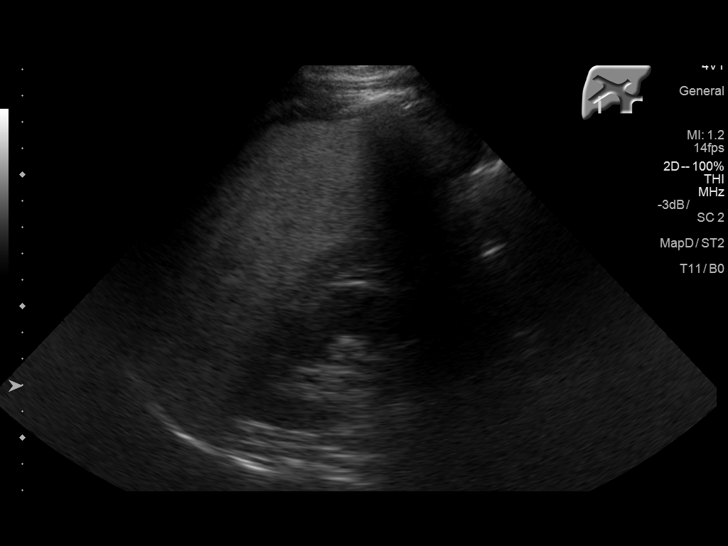
[im 52/89]
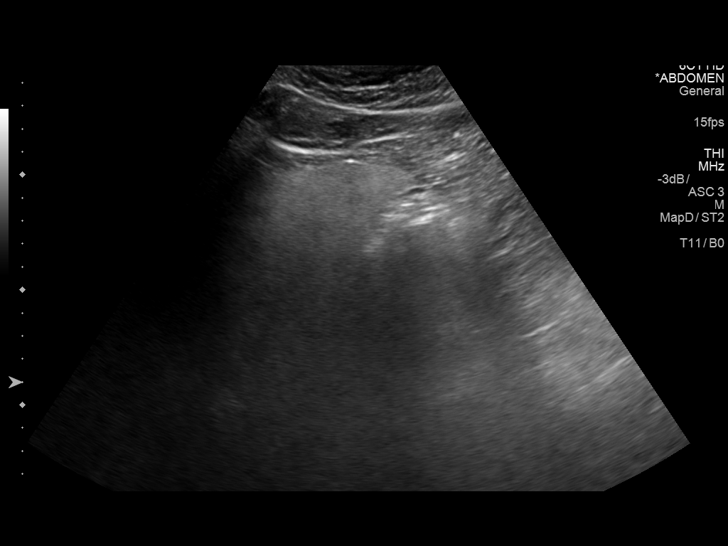
[im 59/89]
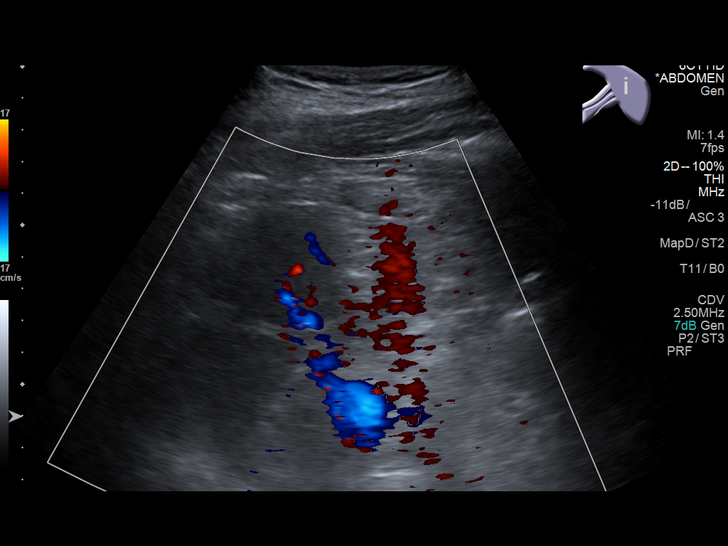
[im 67/89]
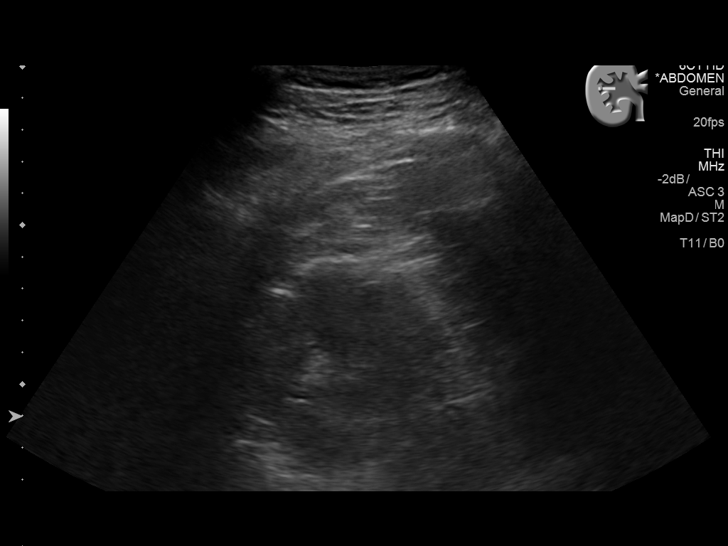
[im 74/89]
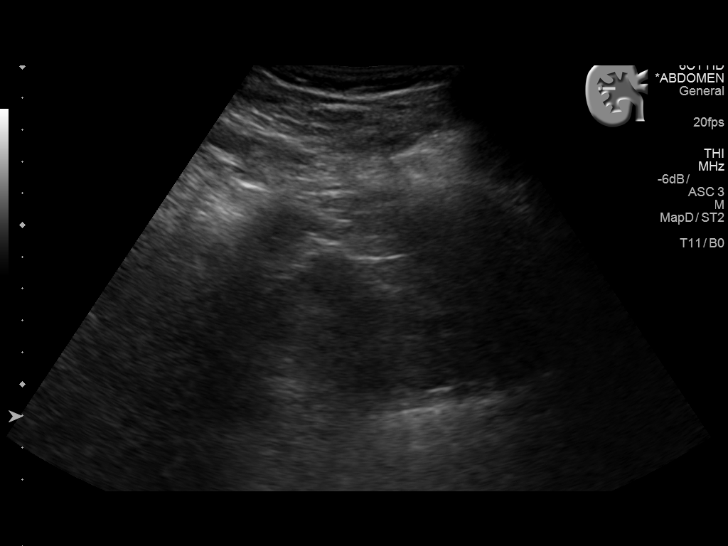
[im 81/89]
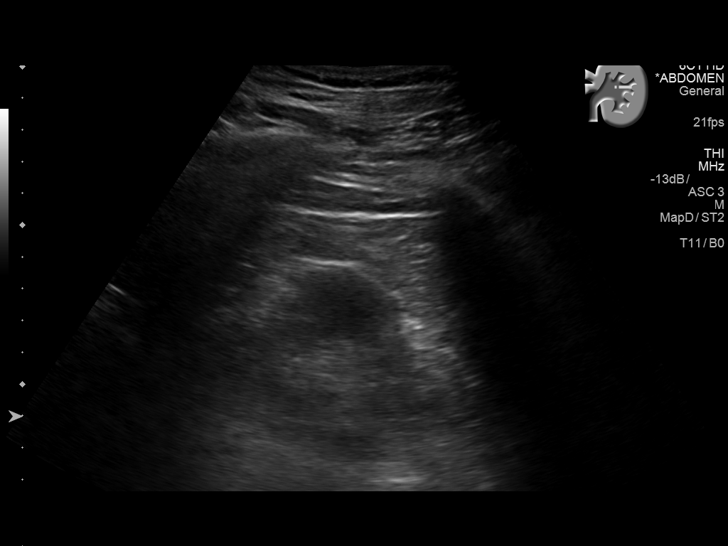
[im 89/89]
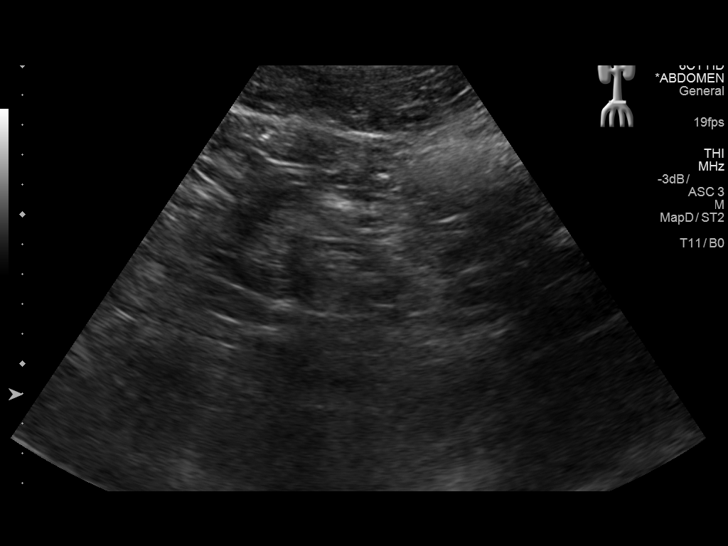

[13 of 25 positions shown; findings below may reference images not displayed]

FINDINGS: Gallbladder: No gallstones or wall thickening visualized. No
sonographic Murphy sign noted.

Common bile duct: Diameter: 3.1 mm

Liver: No focal lesion identified. Echogenicity is diffusely
increased, suggesting underlying hepatic steatosis.

IVC: No abnormality visualized.

Pancreas: Visualized portion unremarkable.

Spleen: Size and appearance within normal limits.  5.1 cm in length.

Right Kidney: Length: 10.5 cm. Echogenicity within normal limits. 5
x 6 mm hypoechoic lesion in the interpolar region of the right
kidney is too small to definitively characterize, but appears to
demonstrate increased through transmission, suggestive of a tiny
cyst. No hydronephrosis visualized.

Left Kidney: Length: 12.5 cm. Echogenicity within normal limits. No
mass or hydronephrosis visualized.

Abdominal aorta: No aneurysm visualized.

Other findings: None.
IMPRESSION: 1. No acute findings to account for the patient's symptoms.
2. Diffusely echogenic hepatic parenchyma, suggestive of hepatic
steatosis.

## 2016-04-13 ENCOUNTER — Encounter (INDEPENDENT_AMBULATORY_CARE_PROVIDER_SITE_OTHER): Payer: Self-pay

## 2016-04-13 ENCOUNTER — Inpatient Hospital Stay: Admission: RE | Admit: 2016-04-13 | Payer: Self-pay | Source: Ambulatory Visit

## 2016-04-19 ENCOUNTER — Ambulatory Visit (INDEPENDENT_AMBULATORY_CARE_PROVIDER_SITE_OTHER): Payer: BLUE CROSS/BLUE SHIELD | Admitting: Podiatry

## 2016-04-19 ENCOUNTER — Other Ambulatory Visit: Payer: Self-pay | Admitting: Family Medicine

## 2016-04-19 ENCOUNTER — Encounter: Payer: Self-pay | Admitting: Podiatry

## 2016-04-19 ENCOUNTER — Inpatient Hospital Stay: Admission: RE | Admit: 2016-04-19 | Payer: Self-pay | Source: Ambulatory Visit

## 2016-04-19 DIAGNOSIS — M722 Plantar fascial fibromatosis: Secondary | ICD-10-CM

## 2016-04-19 DIAGNOSIS — M7662 Achilles tendinitis, left leg: Secondary | ICD-10-CM | POA: Diagnosis not present

## 2016-04-19 MED ORDER — TRIAMCINOLONE ACETONIDE 10 MG/ML IJ SUSP
10.0000 mg | Freq: Once | INTRAMUSCULAR | Status: AC
  Administered 2016-04-19: 10 mg

## 2016-04-19 MED ORDER — PRAVASTATIN SODIUM 20 MG PO TABS: 20.0000 mg | ORAL_TABLET | Freq: Every day | ORAL | 2 refills | Status: DC

## 2016-04-19 MED ORDER — DICLOFENAC SODIUM 75 MG PO TBEC: 75.0000 mg | DELAYED_RELEASE_TABLET | Freq: Two times a day (BID) | ORAL | 2 refills | Status: DC

## 2016-04-19 NOTE — Telephone Encounter (Signed)
Rx request to pharmacy/SLS  

## 2016-04-19 NOTE — Telephone Encounter (Signed)
Med refill for 2 medications.   1. triamterene-hydrochlorothiazide,  2. pravastatin   Pharmacy: [facility] - [address]

## 2016-04-19 NOTE — Telephone Encounter (Signed)
Called pt. Informed him that Rx. Are at pharmacy for pick up.

## 2016-04-19 NOTE — Patient Instructions (Signed)

## 2016-04-20 MED ORDER — TRIAMTERENE-HCTZ 37.5-25 MG PO TABS: 1.0000 | ORAL_TABLET | Freq: Every day | ORAL | 0 refills | Status: DC

## 2016-04-20 NOTE — Progress Notes (Signed)
Subjective:     Patient ID: [name] Foster, male   DOB: [birth year], 50 y.o.   MRN: [id number]  HPI patient states I'm still having pain in the left heel and it seems to be more underneath in the back with the Achilles still sore but more in the musculotendinous junction   Review of Systems     Objective:   Physical Exam Neurovascular status intact muscle strength adequate with patient having more plantar pain but more proximal in the plantar heel and mild discomfort in the posterior heel which is more in the musculotendinous junction    Assessment:     Inflammatory condition consistent with continued fasciitis-like symptoms with Achilles tendinitis also occurring    Plan:     Reviewed both conditions and the compensation that occurs and did careful plantar injection 3 mg Kenalog 5 mg Xylocaine and advised on continued anti-inflammatory treatment

## 2016-04-20 NOTE — Telephone Encounter (Signed)
Caller name: Relationship to patient: Self  Can be reached: [phone] Pharmacy: [facility], [place] [phone] (Phone) [phone] (Fax)     Reason for call: Request refill on tadalafil (CIALIS) 5 MG tablet [id number]

## 2016-04-24 ENCOUNTER — Other Ambulatory Visit: Payer: Self-pay | Admitting: Family Medicine

## 2016-04-24 ENCOUNTER — Telehealth: Payer: Self-pay | Admitting: Family Medicine

## 2016-04-24 MED ORDER — TRIAMTERENE-HCTZ 37.5-25 MG PO TABS: 1.0000 | ORAL_TABLET | Freq: Every day | ORAL | 0 refills | Status: DC

## 2016-04-24 MED ORDER — PRAVASTATIN SODIUM 20 MG PO TABS: 20.0000 mg | ORAL_TABLET | Freq: Every day | ORAL | 0 refills | Status: DC

## 2016-04-24 MED ORDER — TRIAMTERENE-HCTZ 37.5-25 MG PO TABS
1.0000 | ORAL_TABLET | Freq: Every day | ORAL | 0 refills | Status: DC
Start: 2016-04-24 — End: 2016-04-24

## 2016-04-24 NOTE — Telephone Encounter (Signed)
Will fill once PCP did ok.  Let me know if ok due

## 2016-04-24 NOTE — Addendum Note (Signed)
Addended by: Sharon Seller B on: 04/24/2016 04:56 PM   Modules accepted: Orders

## 2016-04-24 NOTE — Telephone Encounter (Signed)
Pt called in informed him that Rx's has been sent to mail order pharmacy. He would like to know if a 30 day could go to pharmacy per original request on 8/9    Pharmacy: [facility] [address]

## 2016-04-24 NOTE — Telephone Encounter (Signed)
Caller name: Lloyd-Sampath,Sharonda  Relationship to patient: Wife Can be reached: [phone]  Pharmacy:  Reason for call: Wife called very upset and irate because she said patient had called to get medications refilled and they had not been done. No request for medication refills found in chart. She stated that her pharmacy told her he needed to be seen by his Provider prior to any refills but she insist that he has been seen since January when he had his physical. Appointment dates were reviewed with her and she was given the dates of his No Show and cancellation. She was very rude (although it seemed she was upset with patient because he had not followed up). She demanded that his medications be called in but hung up the phone prior to giving the name of the medication or the pharmacy she wanted it sent to. Appointment was scheduled for patient to come in. Called wife back to get the name of the medication and the pharmacy but had to leave message asking her to call the office back to give [place] the information.

## 2016-04-24 NOTE — Telephone Encounter (Signed)
Triamterene-Hydrochlorothiazide 37.5-25  Mg is the medication that needs to be refilled patient is scheduled to come in 05/01/16 for a medication refill

## 2016-04-24 NOTE — Telephone Encounter (Signed)
Refill sent in

## 2016-04-25 ENCOUNTER — Encounter: Payer: Self-pay | Admitting: *Deleted

## 2016-04-25 DIAGNOSIS — K648 Other hemorrhoids: Secondary | ICD-10-CM | POA: Insufficient documentation

## 2016-05-01 ENCOUNTER — Ambulatory Visit: Payer: Self-pay | Admitting: Family Medicine

## 2016-05-17 ENCOUNTER — Ambulatory Visit: Payer: Self-pay | Admitting: Internal Medicine

## 2016-05-17 ENCOUNTER — Ambulatory Visit: Payer: BLUE CROSS/BLUE SHIELD | Admitting: Podiatry

## 2016-05-18 ENCOUNTER — Telehealth: Payer: Self-pay | Admitting: *Deleted

## 2016-05-18 NOTE — Telephone Encounter (Signed)
No show letter mailed to patient. 

## 2016-05-19 ENCOUNTER — Inpatient Hospital Stay: Admission: RE | Admit: 2016-05-19 | Payer: Self-pay | Source: Ambulatory Visit

## 2016-05-22 ENCOUNTER — Ambulatory Visit (INDEPENDENT_AMBULATORY_CARE_PROVIDER_SITE_OTHER): Payer: BLUE CROSS/BLUE SHIELD | Admitting: Family Medicine

## 2016-05-22 VITALS — BP 152/98 | HR 74 | Temp 97.8°F | Wt 271.0 lb

## 2016-05-22 DIAGNOSIS — E785 Hyperlipidemia, unspecified: Secondary | ICD-10-CM

## 2016-05-22 DIAGNOSIS — R079 Chest pain, unspecified: Secondary | ICD-10-CM

## 2016-05-22 DIAGNOSIS — K648 Other hemorrhoids: Secondary | ICD-10-CM

## 2016-05-22 DIAGNOSIS — K219 Gastro-esophageal reflux disease without esophagitis: Secondary | ICD-10-CM | POA: Diagnosis not present

## 2016-05-22 DIAGNOSIS — I10 Essential (primary) hypertension: Secondary | ICD-10-CM | POA: Diagnosis not present

## 2016-05-22 DIAGNOSIS — R1013 Epigastric pain: Secondary | ICD-10-CM | POA: Diagnosis not present

## 2016-05-22 DIAGNOSIS — R739 Hyperglycemia, unspecified: Secondary | ICD-10-CM | POA: Diagnosis not present

## 2016-05-22 DIAGNOSIS — E669 Obesity, unspecified: Secondary | ICD-10-CM

## 2016-05-22 DIAGNOSIS — IMO0001 Reserved for inherently not codable concepts without codable children: Secondary | ICD-10-CM

## 2016-05-22 DIAGNOSIS — G4733 Obstructive sleep apnea (adult) (pediatric): Secondary | ICD-10-CM

## 2016-05-22 MED ORDER — CLOTRIMAZOLE-BETAMETHASONE 1-0.05 % EX CREA: 1.0000 "application " | TOPICAL_CREAM | Freq: Two times a day (BID) | CUTANEOUS | 2 refills | Status: DC

## 2016-05-22 MED ORDER — FLUTICASONE PROPIONATE 50 MCG/ACT NA SUSP: 2.0000 | Freq: Every day | NASAL | 6 refills | Status: DC

## 2016-05-22 MED ORDER — IBUPROFEN 400 MG PO TABS: 400.0000 mg | ORAL_TABLET | Freq: Three times a day (TID) | ORAL | 1 refills | Status: DC | PRN

## 2016-05-22 MED ORDER — ESOMEPRAZOLE MAGNESIUM 40 MG PO CPDR: 40.0000 mg | DELAYED_RELEASE_CAPSULE | Freq: Every day | ORAL | 3 refills | Status: DC

## 2016-05-22 MED ORDER — HYOSCYAMINE SULFATE 0.125 MG SL SUBL: 0.1250 mg | SUBLINGUAL_TABLET | SUBLINGUAL | 1 refills | Status: DC | PRN

## 2016-05-22 MED ORDER — RANITIDINE HCL 300 MG PO TABS
300.0000 mg | ORAL_TABLET | Freq: Every day | ORAL | 2 refills | Status: DC
Start: 2016-05-22 — End: 2016-11-21

## 2016-05-22 MED ORDER — LOSARTAN POTASSIUM 25 MG PO TABS: 25.0000 mg | ORAL_TABLET | Freq: Every day | ORAL | 3 refills | Status: DC

## 2016-05-22 MED ORDER — FLUCONAZOLE 150 MG PO TABS: 150.0000 mg | ORAL_TABLET | ORAL | 0 refills | Status: DC

## 2016-05-22 NOTE — Assessment & Plan Note (Signed)
hgba1c acceptable, minimize simple carbs. Increase exercise as tolerated. Continue current meds 

## 2016-05-22 NOTE — Progress Notes (Signed)
Patient ID: [name] Foster, male   DOB: [birth year], 50 y.o.   MRN: [id number]   Subjective:    Patient ID: [name] Foster, male    DOB: 01-08-1966, 50 y.o.   MRN: [id number]  Chief Complaint  Patient presents with  . Medication Refill    follow up    HPI Patient is in today for follow up. He feels well today. No recent hospitalization or acute illness. His reflux is still symptomatic but better than it was. No chest pain associated any more. Is only taking the PPI once daily. Denies CP/palp/SOB/HA/congestion/fevers or GU c/o. Taking meds as prescribed  Past Medical History:  Diagnosis Date  . Bone spur    left foot  . Diverticulosis   . Erectile dysfunction 01/15/2013  . Esophageal reflux 01/15/2013  . Fatty liver 07/04/2015  . GERD (gastroesophageal reflux disease)   . Hiatal hernia   . Hiatal hernia with gastroesophageal reflux 03/20/2014  . Hyperglycemia 07/13/2013  . Hyperplastic colon polyp   . Hypertension   . Internal hemorrhoids   . Mixed hyperlipidemia   . Murmur   . OSA (obstructive sleep apnea)    s/p UPPP  . Reflux esophagitis   . Stomach ulcer    from PCP  . Urinary hesitancy 09/30/2015    Past Surgical History:  Procedure Laterality Date  . [place] STUDY N/A 02/28/2016   Procedure: [place] STUDY;  Surgeon: Jerene Bears, MD;  Location: [facility];  Service: Gastroenterology;  Laterality: N/A;  . ESOPHAGEAL MANOMETRY N/A 09/01/2013   Procedure: ESOPHAGEAL MANOMETRY (EM);  Surgeon: Sable Feil, MD;  Location: [facility];  Service: Endoscopy;  Laterality: N/A;  . ESOPHAGEAL MANOMETRY N/A 02/28/2016   Procedure: ESOPHAGEAL MANOMETRY (EM);  Surgeon: Jerene Bears, MD;  Location: [facility];  Service: Gastroenterology;  Laterality: N/A;  . TONSILLECTOMY    . UVULECTOMY      Family History  Problem Relation Age of Onset  . Dementia Mother   . Diabetes Mother   . Hypertension Mother   . Heart failure Mother   . Hypertension      siblings  . Colon  cancer Neg Hx     Social History   Social History  . Marital status: Married    Spouse name: N/A  . Number of children: N/A  . Years of education: N/A   Occupational History  . Bus Midwife   Social History Main Topics  . Smoking status: Never Smoker  . Smokeless tobacco: Never Used  . Alcohol use No  . Drug use: No  . Sexual activity: Yes   Other Topics Concern  . Not on file   Social History Narrative   Tobacco Use - No.    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 acetaminophen (TYLENOL) 500 MG tablet Take 1 tablet (500 mg total) by mouth every 6 (six) hours as needed for pain. 60 tablet 0  . aspirin EC 81 MG tablet  Take 81 mg by mouth daily.    . diclofenac (VOLTAREN) 75 MG EC tablet Take 1 tablet (75 mg total) by mouth 2 (two) times daily. 50 tablet 2  . Multiple Vitamin (MULTIVITAMIN) tablet Take 1 tablet by mouth daily.    . NONFORMULARY OR COMPOUNDED ITEM     . pravastatin (PRAVACHOL) 20 MG tablet Take 1 tablet (20 mg total) by mouth daily. 90 tablet 0  . tadalafil (CIALIS) 5 MG tablet Take 1 tablet (5 mg total) by mouth daily as needed for erectile dysfunction. 30 tablet 5  . traMADol (ULTRAM) 50 MG tablet Take 1 tablet (50 mg total) by mouth 3 (three) times daily. (Patient not taking: Reported on 01/31/2016) 90 tablet 2  . triamterene-hydrochlorothiazide (MAXZIDE-25) 37.5-25 MG tablet Take 1 tablet by mouth daily. 30 tablet 0  . amitriptyline (ELAVIL) 50 MG tablet Take 1 tablet (50 mg total) by mouth at bedtime. 30 tablet 2  . amoxicillin-clavulanate (AUGMENTIN) 875-125 MG tablet Take 1 tablet by mouth 2 (two)  times daily.  0  . fluticasone (FLONASE) 50 MCG/ACT nasal spray Place 2 sprays into both nostrils daily. 16 g 6  . hyoscyamine (LEVSIN SL) 0.125 MG SL tablet Place 1 tablet (0.125 mg total) under the tongue every 4 (four) hours as needed for cramping. (Patient not taking: Reported on 01/31/2016) 40 tablet 1  . ibuprofen (ADVIL,MOTRIN) 800 MG tablet Take 1 tablet (800 mg total) by mouth every 8 (eight) hours as needed. 30 tablet 0  . lansoprazole (PREVACID) 30 MG capsule Take 1 capsule (30 mg total) by mouth 2 (two) times daily before a meal. 60 capsule 3  . predniSONE (DELTASONE) 10 MG tablet 12 day tapering dose 48 tablet 0  . terbinafine (LAMISIL) 250 MG tablet Take 1 tablet (250 mg total) by mouth daily. 45 tablet 0   No facility-administered medications prior to visit.     Allergies  Allergen Reactions  . Hydrocodone Itching    Review of Systems  Constitutional: Negative for fever and malaise/fatigue.  HENT: Negative for congestion.   Eyes: Negative for blurred vision.  Respiratory: Negative for shortness of breath.   Cardiovascular: Negative for chest pain, palpitations and leg swelling.  Gastrointestinal: Negative for abdominal pain, blood in stool and nausea.  Genitourinary: Negative for dysuria and frequency.  Musculoskeletal: Negative for falls.  Skin: Negative for rash.  Neurological: Negative for dizziness, loss of consciousness and headaches.  Endo/Heme/Allergies: Negative for environmental allergies.  Psychiatric/Behavioral: Negative for depression. The patient is not nervous/anxious.        Objective:    Physical Exam  Constitutional: He is oriented to person, place, and time. He appears well-developed and well-nourished. No distress.  HENT:  Head: Normocephalic and atraumatic.  Nose: Nose normal.  Eyes: Right eye exhibits no discharge. Left eye exhibits no discharge.  Neck: Normal range of motion. Neck supple.  Cardiovascular: Normal rate and regular rhythm.     No murmur heard. Pulmonary/Chest: Effort normal and breath sounds normal.  Abdominal: Soft. Bowel sounds are normal. There is no tenderness.  Musculoskeletal: He exhibits no edema.  Neurological: He is alert and oriented to person, place, and time.  Skin: Skin is warm and dry.  Psychiatric: He has a normal mood and affect.  Nursing note and vitals reviewed.   BP (!) 152/98 (BP Location: Left Arm, Cuff Size: Large)   Pulse 74   Temp 97.8 F (36.6 C) (Oral)   Wt 271 lb (122.9 kg)   SpO2 96%  BMI 36.75 kg/m  Wt Readings from Last 3 Encounters:  05/22/16 271 lb (122.9 kg)  03/06/16 263 lb (119.3 kg)  01/31/16 266 lb (120.7 kg)     Lab Results  Component Value Date   WBC 4.7 09/30/2015   HGB 16.7 09/30/2015   HCT 50.5 09/30/2015   PLT 210.0 09/30/2015   GLUCOSE 93 09/30/2015   CHOL 250 (H) 09/30/2015   TRIG 190.0 (H) 09/30/2015   HDL 38.10 (L) 09/30/2015   LDLCALC 174 (H) 09/30/2015   ALT 46 09/30/2015   AST 29 09/30/2015   NA 139 09/30/2015   K 3.9 09/30/2015   CL 104 09/30/2015   CREATININE 1.00 09/30/2015   BUN 16 09/30/2015   CO2 24 09/30/2015   TSH 1.79 09/30/2015   PSA 1.97 09/30/2015   HGBA1C 6.4 09/30/2015    Lab Results  Component Value Date   TSH 1.79 09/30/2015   Lab Results  Component Value Date   WBC 4.7 09/30/2015   HGB 16.7 09/30/2015   HCT 50.5 09/30/2015   MCV 84.7 09/30/2015   PLT 210.0 09/30/2015   Lab Results  Component Value Date   NA 139 09/30/2015   K 3.9 09/30/2015   CO2 24 09/30/2015   GLUCOSE 93 09/30/2015   BUN 16 09/30/2015   CREATININE 1.00 09/30/2015   BILITOT 0.4 09/30/2015   ALKPHOS 72 09/30/2015   AST 29 09/30/2015   ALT 46 09/30/2015   PROT 7.5 09/30/2015   ALBUMIN 4.5 09/30/2015   CALCIUM 9.4 09/30/2015   GFR 102.60 09/30/2015   Lab Results  Component Value Date   CHOL 250 (H) 09/30/2015   Lab Results  Component Value Date   HDL 38.10 (L) 09/30/2015   Lab Results  Component Value Date   LDLCALC  174 (H) 09/30/2015   Lab Results  Component Value Date   TRIG 190.0 (H) 09/30/2015   Lab Results  Component Value Date   CHOLHDL 7 09/30/2015   Lab Results  Component Value Date   HGBA1C 6.4 09/30/2015       Assessment & Plan:   Problem List Items Addressed This Visit    Hyperlipidemia   Relevant Medications   losartan (COZAAR) 25 MG tablet   Other Relevant Orders   Lipid panel   Essential hypertension    Poorly controlled will alter medications, encouraged DASH diet, minimize caffeine and obtain adequate sleep. Report concerning symptoms and follow up as directed and as needed. Add Losartan 25 mg daily      Relevant Medications   losartan (COZAAR) 25 MG tablet   Other Relevant Orders   TSH   CBC   Comprehensive metabolic panel   Esophageal reflux    Avoid offending foods, start probiotics. Do not eat large meals in late evening and consider raising head of bed.       Relevant Medications   hyoscyamine (LEVSIN SL) 0.125 MG SL tablet   esomeprazole (NEXIUM) 40 MG capsule   ranitidine (ZANTAC) 300 MG tablet   Obesity   Hyperglycemia    hgba1c acceptable, minimize simple carbs. Increase exercise as tolerated. Continue current meds      Relevant Orders   Hemoglobin A1c   RESOLVED: OSA (obstructive sleep apnea)    Wears cpap nightly      Gastroesophageal reflux disease without esophagitis - Primary    Given rx for Nexium 40 mg daily and can take Ranitidine 300 mg po qhs to use prn      Relevant Medications  hyoscyamine (LEVSIN SL) 0.125 MG SL tablet   esomeprazole (NEXIUM) 40 MG capsule   ranitidine (ZANTAC) 300 MG tablet   Internal hemorrhoids   Relevant Medications   losartan (COZAAR) 25 MG tablet    Other Visit Diagnoses    Chest pain, unspecified chest pain type       Relevant Medications   hyoscyamine (LEVSIN SL) 0.125 MG SL tablet   Abdominal pain, epigastric       Relevant Medications   hyoscyamine (LEVSIN SL) 0.125 MG SL tablet   Reflux        Relevant Medications   ranitidine (ZANTAC) 300 MG tablet      I have discontinued Mr. Buonocore's terbinafine, amoxicillin-clavulanate, lansoprazole, amitriptyline, ibuprofen, and predniSONE. I am also having him start on esomeprazole, ibuprofen, losartan, fluconazole, and clotrimazole-betamethasone. Additionally, I am having him maintain his acetaminophen, tadalafil, aspirin EC, multivitamin, traMADol, NONFORMULARY OR COMPOUNDED ITEM, diclofenac, pravastatin, triamterene-hydrochlorothiazide, fluticasone, hyoscyamine, and ranitidine.  Meds ordered this encounter  Medications  . fluticasone (FLONASE) 50 MCG/ACT nasal spray    Sig: Place 2 sprays into both nostrils daily.    Dispense:  16 g    Refill:  6  . hyoscyamine (LEVSIN SL) 0.125 MG SL tablet    Sig: Place 1 tablet (0.125 mg total) under the tongue every 4 (four) hours as needed for cramping.    Dispense:  40 tablet    Refill:  1  . esomeprazole (NEXIUM) 40 MG capsule    Sig: Take 1 capsule (40 mg total) by mouth daily.    Dispense:  30 capsule    Refill:  3  . ranitidine (ZANTAC) 300 MG tablet    Sig: Take 1 tablet (300 mg total) by mouth at bedtime.    Dispense:  30 tablet    Refill:  2  . ibuprofen (ADVIL,MOTRIN) 400 MG tablet    Sig: Take 1 tablet (400 mg total) by mouth every 8 (eight) hours as needed.    Dispense:  90 tablet    Refill:  1  . losartan (COZAAR) 25 MG tablet    Sig: Take 1 tablet (25 mg total) by mouth daily.    Dispense:  30 tablet    Refill:  3  . fluconazole (DIFLUCAN) 150 MG tablet    Sig: Take 1 tablet (150 mg total) by mouth once a week.    Dispense:  2 tablet    Refill:  0  . clotrimazole-betamethasone (LOTRISONE) cream    Sig: Apply 1 application topically 2 (two) times daily.    Dispense:  45 g    Refill:  2     Penni Homans, MD

## 2016-05-22 NOTE — Assessment & Plan Note (Signed)
Given rx for Nexium 40 mg daily and can take Ranitidine 300 mg po qhs to use prn

## 2016-05-22 NOTE — Assessment & Plan Note (Signed)
Poorly controlled will alter medications, encouraged DASH diet, minimize caffeine and obtain adequate sleep. Report concerning symptoms and follow up as directed and as needed. Add Losartan 25 mg daily 

## 2016-05-22 NOTE — Progress Notes (Signed)
Pre visit review using our clinic review tool, if applicable. No additional management support is needed unless otherwise documented below in the visit note. 

## 2016-05-22 NOTE — Assessment & Plan Note (Signed)
Avoid offending foods, start probiotics. Do not eat large meals in late evening and consider raising head of bed.  

## 2016-05-22 NOTE — Patient Instructions (Signed)
DASH Eating Plan  DASH stands for "Dietary Approaches to Stop Hypertension." The DASH eating plan is a healthy eating plan that has been shown to reduce high blood pressure (hypertension). Additional health benefits may include reducing the risk of type 2 diabetes mellitus, heart disease, and stroke. The DASH eating plan may also help with weight loss.  WHAT DO I NEED TO KNOW ABOUT THE DASH EATING PLAN?  For the DASH eating plan, you will follow these general guidelines:  · Choose foods with a percent daily value for sodium of less than 5% (as listed on the food label).  · Use salt-free seasonings or herbs instead of table salt or sea salt.  · Check with your health care provider or pharmacist before using salt substitutes.  · Eat lower-sodium products, often labeled as "lower sodium" or "no salt added."  · Eat fresh foods.  · Eat more vegetables, fruits, and low-fat dairy products.  · Choose whole grains. Look for the word "whole" as the first word in the ingredient list.  · Choose fish and skinless chicken or turkey more often than red meat. Limit fish, poultry, and meat to 6 oz (170 g) each day.  · Limit sweets, desserts, sugars, and sugary drinks.  · Choose heart-healthy fats.  · Limit cheese to 1 oz (28 g) per day.  · Eat more home-cooked food and less restaurant, buffet, and fast food.  · Limit fried foods.  · Cook foods using methods other than frying.  · Limit canned vegetables. If you do use them, rinse them well to decrease the sodium.  · When eating at a restaurant, ask that your food be prepared with less salt, or no salt if possible.  WHAT FOODS CAN I EAT?  Seek help from a dietitian for individual calorie needs.  Grains  Whole grain or whole wheat bread. Brown rice. Whole grain or whole wheat pasta. Quinoa, bulgur, and whole grain cereals. Low-sodium cereals. Corn or whole wheat flour tortillas. Whole grain cornbread. Whole grain crackers. Low-sodium crackers.  Vegetables  Fresh or frozen vegetables  (raw, steamed, roasted, or grilled). Low-sodium or reduced-sodium tomato and vegetable juices. Low-sodium or reduced-sodium tomato sauce and paste. Low-sodium or reduced-sodium canned vegetables.   Fruits  All fresh, canned (in natural juice), or frozen fruits.  Meat and Other Protein Products  Ground beef (85% or leaner), grass-fed beef, or beef trimmed of fat. Skinless chicken or turkey. Ground chicken or turkey. Pork trimmed of fat. All fish and seafood. Eggs. Dried beans, peas, or lentils. Unsalted nuts and seeds. Unsalted canned beans.  Dairy  Low-fat dairy products, such as skim or 1% milk, 2% or reduced-fat cheeses, low-fat ricotta or cottage cheese, or plain low-fat yogurt. Low-sodium or reduced-sodium cheeses.  Fats and Oils  Tub margarines without trans fats. Light or reduced-fat mayonnaise and salad dressings (reduced sodium). Avocado. Safflower, olive, or canola oils. Natural peanut or almond butter.  Other  Unsalted popcorn and pretzels.  The items listed above may not be a complete list of recommended foods or beverages. Contact your dietitian for more options.  WHAT FOODS ARE NOT RECOMMENDED?  Grains  White bread. White pasta. White rice. Refined cornbread. Bagels and croissants. Crackers that contain trans fat.  Vegetables  Creamed or fried vegetables. Vegetables in a cheese sauce. Regular canned vegetables. Regular canned tomato sauce and paste. Regular tomato and vegetable juices.  Fruits  Dried fruits. Canned fruit in light or heavy syrup. Fruit juice.  Meat and Other Protein   Products  Fatty cuts of meat. Ribs, chicken wings, bacon, sausage, bologna, salami, chitterlings, fatback, hot dogs, bratwurst, and packaged luncheon meats. Salted nuts and seeds. Canned beans with salt.  Dairy  Whole or 2% milk, cream, half-and-half, and cream cheese. Whole-fat or sweetened yogurt. Full-fat cheeses or blue cheese. Nondairy creamers and whipped toppings. Processed cheese, cheese spreads, or cheese  curds.  Condiments  Onion and garlic salt, seasoned salt, table salt, and sea salt. Canned and packaged gravies. Worcestershire sauce. Tartar sauce. Barbecue sauce. Teriyaki sauce. Soy sauce, including reduced sodium. Steak sauce. Fish sauce. Oyster sauce. Cocktail sauce. Horseradish. Ketchup and mustard. Meat flavorings and tenderizers. Bouillon cubes. Hot sauce. Tabasco sauce. Marinades. Taco seasonings. Relishes.  Fats and Oils  Butter, stick margarine, lard, shortening, ghee, and bacon fat. Coconut, palm kernel, or palm oils. Regular salad dressings.  Other  Pickles and olives. Salted popcorn and pretzels.  The items listed above may not be a complete list of foods and beverages to avoid. Contact your dietitian for more information.  WHERE CAN I FIND MORE INFORMATION?  National Heart, Lung, and [employer] Institute: www.nhlbi.nih.gov/health/health-topics/topics/dash/     This information is not intended to replace advice given to you by your health care provider. Make sure you discuss any questions you have with your health care provider.     Document Released: 08/17/2011 Document Revised: 09/18/2014 Document Reviewed: 07/02/2013  Elsevier Interactive Patient Education ©2016 [employer]

## 2016-05-22 NOTE — Assessment & Plan Note (Signed)
Wears cpap nightly 

## 2016-05-23 LAB — COMPREHENSIVE METABOLIC PANEL
ALT: 49 U/L (ref 0–53)
AST: 31 U/L (ref 0–37)
Albumin: 4.5 g/dL (ref 3.5–5.2)
Alkaline Phosphatase: 56 U/L (ref 39–117)
BUN: 16 mg/dL (ref 6–23)
CALCIUM: 9.4 mg/dL (ref 8.4–10.5)
CHLORIDE: 106 meq/L (ref 96–112)
CO2: 27 meq/L (ref 19–32)
Creatinine, Ser: 1.05 mg/dL (ref 0.40–1.50)
GFR: 96.72 mL/min (ref >60.00)
Glucose, Bld: 93 mg/dL (ref 70–99)
Potassium: 3.9 mEq/L (ref 3.5–5.1)
Sodium: 138 mEq/L (ref 135–145)
Total Bilirubin: 0.3 mg/dL (ref 0.2–1.2)
Total Protein: 7.5 g/dL (ref 6.0–8.3)

## 2016-05-23 LAB — CBC
HEMATOCRIT: 45.1 % (ref 39.0–52.0)
HEMOGLOBIN: 15.4 g/dL (ref 13.0–17.0)
MCHC: 34.2 g/dL (ref 30.0–36.0)
MCV: 84.8 fl (ref 78.0–100.0)
PLATELETS: 191 10*3/uL (ref 150.0–400.0)
RBC: 5.31 Mil/uL (ref 4.22–5.81)
RDW: 14.1 % (ref 11.5–15.5)
WBC: 5.7 10*3/uL (ref 4.0–10.5)

## 2016-05-23 LAB — TSH: TSH: 2.59 u[IU]/mL (ref 0.35–4.50)

## 2016-05-23 LAB — LIPID PANEL
CHOL/HDL RATIO: 6
CHOLESTEROL: 251 mg/dL — AB (ref 0–200)
HDL: 40.1 mg/dL (ref >39.00)
NONHDL: 211.22
TRIGLYCERIDES: 209 mg/dL — AB (ref 0.0–149.0)
VLDL: 41.8 mg/dL — ABNORMAL HIGH (ref 0.0–40.0)

## 2016-05-23 LAB — LDL CHOLESTEROL, DIRECT: Direct LDL: 173 mg/dL

## 2016-05-23 LAB — HEMOGLOBIN A1C: Hgb A1c MFr Bld: 6.6 % — ABNORMAL HIGH (ref 4.6–6.5)

## 2016-05-24 ENCOUNTER — Other Ambulatory Visit: Payer: Self-pay

## 2016-05-24 DIAGNOSIS — E785 Hyperlipidemia, unspecified: Secondary | ICD-10-CM

## 2016-05-24 MED ORDER — ACETAMINOPHEN 500 MG PO TABS: 500.0000 mg | ORAL_TABLET | Freq: Four times a day (QID) | ORAL | 0 refills | Status: AC | PRN

## 2016-05-24 MED ORDER — ATORVASTATIN CALCIUM 20 MG PO TABS: 20.0000 mg | ORAL_TABLET | Freq: Every day | ORAL | 5 refills | Status: DC

## 2016-06-14 ENCOUNTER — Encounter: Payer: BLUE CROSS/BLUE SHIELD | Admitting: Podiatry

## 2016-06-20 ENCOUNTER — Telehealth: Payer: Self-pay | Admitting: Family Medicine

## 2016-06-20 ENCOUNTER — Ambulatory Visit (INDEPENDENT_AMBULATORY_CARE_PROVIDER_SITE_OTHER): Payer: BLUE CROSS/BLUE SHIELD | Admitting: Family Medicine

## 2016-06-20 VITALS — BP 145/79 | HR 70

## 2016-06-20 DIAGNOSIS — I10 Essential (primary) hypertension: Secondary | ICD-10-CM | POA: Diagnosis not present

## 2016-06-20 MED ORDER — LOSARTAN POTASSIUM 25 MG PO TABS: 50.0000 mg | ORAL_TABLET | Freq: Every day | ORAL | 2 refills | Status: DC

## 2016-06-20 NOTE — Patient Instructions (Addendum)
Per Dr. Charlett Blake: Increase Losartan to 50 MG daily and continue current regimen with all other medications. Return in 1 month for nurse visit to recheck blood pressure & CMP (Comprehensive Metabolic Panel).

## 2016-06-20 NOTE — Progress Notes (Signed)
Pre visit review using our clinic review tool, if applicable. No additional management support is needed unless otherwise documented below in the visit note.  Patient presents in office for blood pressure check per OV note 05/22/16. Reviewed medication & current regimen with the patient. Today's readings were as follow: BP 146/94 P 68 & BP 145/79 P 70.  Per Dr. Charlett Blake: Increase Losartan to 50 MG daily and continue current regimen with all other medications. Return in 1 month for nurse visit to recheck blood pressure & CMP (Comprehensive Metabolic Panel).  Informed patient of the provider's instructions. He verbalized understanding and did not have any further questions or concerns prior to leaving the visit.  Next appointment scheduled for 07/21/16 at 10:00 AM.  RN blood pressure check note reviewed. Agree with documention and plan.

## 2016-06-20 NOTE — Progress Notes (Signed)
RN blood pressure check note reviewed. Agree with documention and plan. 

## 2016-06-20 NOTE — Telephone Encounter (Signed)
Needed clarification if taking 25 mg bid #30 was previously sent in needed to change to #60 to send in. Clarification informed to pharmacy and sent in correct #

## 2016-06-20 NOTE — Telephone Encounter (Signed)
Pharmacy called in to get clarification on Rx for COZAAR. She would like a call back :   CB: [facility] [address]

## 2016-06-22 NOTE — Progress Notes (Signed)
This encounter was created in error - please disregard.

## 2016-07-07 ENCOUNTER — Telehealth: Payer: Self-pay | Admitting: Family Medicine

## 2016-07-10 ENCOUNTER — Ambulatory Visit: Payer: Self-pay | Admitting: Medical

## 2016-07-12 ENCOUNTER — Encounter: Payer: Self-pay | Admitting: Medical

## 2016-07-12 ENCOUNTER — Ambulatory Visit (INDEPENDENT_AMBULATORY_CARE_PROVIDER_SITE_OTHER): Payer: BLUE CROSS/BLUE SHIELD | Admitting: Medical

## 2016-07-12 VITALS — BP 144/90 | HR 78 | Temp 98.2°F | Ht 72.0 in | Wt 265.6 lb

## 2016-07-12 DIAGNOSIS — L853 Xerosis cutis: Secondary | ICD-10-CM

## 2016-07-12 DIAGNOSIS — Q828 Other specified congenital malformations of skin: Secondary | ICD-10-CM

## 2016-07-12 DIAGNOSIS — H1013 Acute atopic conjunctivitis, bilateral: Secondary | ICD-10-CM

## 2016-07-12 DIAGNOSIS — L989 Disorder of the skin and subcutaneous tissue, unspecified: Secondary | ICD-10-CM

## 2016-07-12 DIAGNOSIS — L918 Other hypertrophic disorders of the skin: Secondary | ICD-10-CM | POA: Diagnosis not present

## 2016-07-12 MED ORDER — LEVOCETIRIZINE DIHYDROCHLORIDE 5 MG PO TABS: 5.0000 mg | ORAL_TABLET | Freq: Every evening | ORAL | 0 refills | Status: DC

## 2016-07-12 MED ORDER — OLOPATADINE HCL 0.1 % OP SOLN
1.0000 [drp] | Freq: Two times a day (BID) | OPHTHALMIC | 1 refills | Status: DC
Start: 2016-07-12 — End: 2017-10-12

## 2016-07-12 MED ORDER — AMMONIUM LACTATE 12 % EX LOTN: TOPICAL_LOTION | CUTANEOUS | 1 refills | Status: DC

## 2016-07-12 NOTE — Progress Notes (Signed)
Subjective:    Patient ID: [name] Foster, male    DOB: 09/04/1966, 50 y.o.   MRN: [id number]  HPI  Pt in with some lesion on his scalp. 2 present for 2-3 weeks. He just noticed these areas. He wants them evaluated and removed/treated.  Pt has some areas of skin growth on his left side unde left r axillary and one on his back rt side near lower rib area.  These are present for 2-3 years. No changes just irritating. No pain.  Also both eye itching for 2 days. No vision changes. No sneezing. Some seasonal allergies. No eye pain. No vision changes. He used some otc eye drops. Helped briefly but later eye was little bit redder. No pressure to eyes. No light flashes. No pain. Just  Itching mildly.    Review of Systems  Constitutional: Negative for chills, fatigue and fever.  Respiratory: Negative for cough, choking, shortness of breath and wheezing.   Gastrointestinal: Negative for abdominal pain.  Musculoskeletal: Negative for back pain and gait problem.  Skin:       Skin tag on thorax.  Neurological: Negative for dizziness, speech difficulty, weakness, light-headedness and numbness.  Hematological: Negative for adenopathy. Does not bruise/bleed easily.  Psychiatric/Behavioral: Negative for behavioral problems and confusion.   Past Medical History:  Diagnosis Date  . Bone spur    left foot  . Diverticulosis   . Erectile dysfunction 01/15/2013  . Esophageal reflux 01/15/2013  . Fatty liver 07/04/2015  . GERD (gastroesophageal reflux disease)   . Hiatal hernia   . Hiatal hernia with gastroesophageal reflux 03/20/2014  . Hyperglycemia 07/13/2013  . Hyperplastic colon polyp   . Hypertension   . Internal hemorrhoids   . Mixed hyperlipidemia   . Murmur   . OSA (obstructive sleep apnea)    s/p UPPP  . Reflux esophagitis   . Stomach ulcer    from PCP  . Urinary hesitancy 09/30/2015     Social History   Social History  . Marital status: Married    Spouse name: N/A  . Number of  children: N/A  . Years of education: N/A   Occupational History  . Bus Midwife   Social History Main Topics  . Smoking status: Never Smoker  . Smokeless tobacco: Never Used  . Alcohol use No  . Drug use: No  . Sexual activity: Yes   Other Topics Concern  . Not on file   Social History Narrative   Tobacco Use - No.    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 [place] STUDY N/A 02/28/2016   Procedure: [place] STUDY;  Surgeon: Jerene Bears, MD;  Location: [facility];  Service: Gastroenterology;  Laterality: N/A;  . ESOPHAGEAL MANOMETRY N/A 09/01/2013   Procedure: ESOPHAGEAL MANOMETRY (EM);  Surgeon: Sable Feil, MD;  Location: [facility];  Service: Endoscopy;  Laterality: N/A;  . ESOPHAGEAL MANOMETRY N/A 02/28/2016   Procedure: ESOPHAGEAL MANOMETRY (EM);  Surgeon: Jerene Bears, MD;  Location: [facility];  Service: Gastroenterology;  Laterality: N/A;  . TONSILLECTOMY    . UVULECTOMY      Family History  Problem Relation Age of Onset  . Dementia Mother   . Diabetes Mother   . Hypertension Mother   . Heart failure Mother   . Hypertension      siblings  . Colon cancer Neg Hx     Allergies  Allergen Reactions  . Hydrocodone Itching    Current Outpatient Prescriptions on File Prior to Visit  Medication Sig Dispense Refill  . acetaminophen (TYLENOL) 500 MG tablet Take 1 tablet (500 mg total) by mouth every 6 (six) hours as needed. 60 tablet 0  . aspirin EC 81 MG tablet Take 81 mg by mouth daily.    [name] atorvastatin (LIPITOR) 20 MG tablet Take 1 tablet (20 mg total) by mouth daily. 30 tablet 5  . clotrimazole-betamethasone  (LOTRISONE) cream Apply 1 application topically 2 (two) times daily. 45 g 2  . diclofenac (VOLTAREN) 75 MG EC tablet Take 1 tablet (75 mg total) by mouth 2 (two) times daily. 50 tablet 2  . esomeprazole (NEXIUM) 40 MG capsule Take 1 capsule (40 mg total) by mouth daily. 30 capsule 3  . fluconazole (DIFLUCAN) 150 MG tablet Take 1 tablet (150 mg total) by mouth once a week. 2 tablet 0  . fluticasone (FLONASE) 50 MCG/ACT nasal spray Place 2 sprays into both nostrils daily. 16 g 6  . hyoscyamine (LEVSIN SL) 0.125 MG SL tablet Place 1 tablet (0.125 mg total) under the tongue every 4 (four) hours as needed for cramping. 40 tablet 1  . ibuprofen (ADVIL,MOTRIN) 400 MG tablet Take 1 tablet (400 mg total) by mouth every 8 (eight) hours as needed. 90 tablet 1  . losartan (COZAAR) 25 MG tablet Take 2 tablets (50 mg total) by mouth daily. 60 tablet 2  . Multiple Vitamin (MULTIVITAMIN) tablet Take 1 tablet by mouth daily.    . NONFORMULARY OR COMPOUNDED ITEM     . ranitidine (ZANTAC) 300 MG tablet Take 1 tablet (300 mg total) by mouth at bedtime. 30 tablet 2  . tadalafil (CIALIS) 5 MG tablet Take 1 tablet (5 mg total) by mouth daily as needed for erectile dysfunction. 30 tablet 5  . traMADol (ULTRAM) 50 MG tablet Take 1 tablet (50 mg total) by mouth 3 (three) times daily. 90 tablet 2  . triamterene-hydrochlorothiazide (MAXZIDE-25) 37.5-25 MG tablet Take 1 tablet by mouth daily. 30 tablet 0   No current facility-administered medications on file prior to visit.     BP (!) 144/90 (BP Location: Right Arm, Patient Position: Sitting)   Pulse 78   Temp 98.2 F (36.8 C) (Oral)   Ht 6' (1.829 m)   Wt 265 lb 9.6 oz (120.5 kg)   SpO2 96%   BMI 36.02 kg/m       Objective:   Physical Exam  General  Mental Status - Alert. General Appearance - Well groomed. Not in acute distress.  Skin Rashes- No Rashes. On scalp. 2 small flat lesion. Both about 8 mm in size. One is smooth. Other appears little irregular on  surface. Both uniform in color.  Lt latissimus dorsi area moderate classic appearing skin tag. Rt side back- lower rib area. Moderate size classic skin tag.(both narrow base/stalk)  Skin also has dry feel and appearance.  HEENT Head- Normal.  Eye Sclera/Conjunctiva- Left- Normal. Right- Normal.(on left conjunctiva little injected.) No dc to either eye) Nose & Sinuses  Nasal Mucosa- Left-  Boggy and Congested. Right-  Boggy and  Congested.Bilateral no  maxillary and no  frontal sinus pressure.(pt does appear to have allergic salute crease on his nose) Mouth & Throat Lips: Upper Lip- Normal: no dryness, cracking, pallor, cyanosis, or vesicular eruption. Lower Lip-Normal: no dryness, cracking, pallor, cyanosis or vesicular eruption. Buccal Mucosa- Bilateral- No Aphthous ulcers. Oropharynx- No Discharge or Erythema. Tonsils: Characteristics- Bilateral- No Erythema or Congestion. Size/Enlargement- Bilateral- No enlargement. Discharge- bilateral-None.  Neck Neck- Supple. No Masses.   Chest and Lung Exam Auscultation: Breath Sounds:-Clear even and unlabored.  Cardiovascular Auscultation:Rythm- Regular, rate and rhythm. Murmurs & Other Heart Sounds:Ausculatation of the heart reveal- No Murmurs.  Lymphatic Head & Neck General Head & Neck Lymphatics: Bilateral: Description- No Localized lymphadenopathy.       Assessment & Plan:  For recent itchy eyes can use rx xyzal antihistamine tablet. Also rx patanol.  For skin tags removed both without any problems. Can remove band aids tomorrow. If any skin infection symptoms please notify.  For skin lesion on scalp will refer you to dermatolgist.  For dry skin rx lachydrin  Follow up in 10-14 days or as needed  Regarding skin tag removal. Pt did sign surgical consent form. Barnet Pall MA witnessed.  Procedure performed sterile manner. Prepped area with betadine. Used lidocaine spray for anesthesia. Both tags removed with sterile scissors. No  complication. Minimal faint bleeding of left lat tag. Brief silver nitrate stopped bleed. The back tag did not require use of silver nitrate. Area dressed/covered with bandaid. Counseled on watching for any signs of any bleeding or infection. Explained wait until tomorrow before removing band aids. If any infection will give antibiotic but also ask pt to come in for check of area. Pt expressed understanding.    Khole Branch, Percell Miller, PA-C

## 2016-07-12 NOTE — Telephone Encounter (Signed)
Pt advised to schedule cpe.Will you let me know when that is?

## 2016-07-12 NOTE — Patient Instructions (Addendum)
For recent itchy eyes can use rx xyzal antihistamine tablet. Also rx patanol eye drops.  For skin tags removed both without any problems. Can remove band aids tomorrow. If any skin infection symptoms please notify.  For skin lesion on scalp will refer you to dermatolgist.  For dry skin rx lachydrin  Follow up in 10-14 days or as needed

## 2016-07-12 NOTE — Progress Notes (Signed)
Pre visit review using our clinic review tool, if applicable. No additional management support is needed unless otherwise documented below in the visit note. 

## 2016-07-14 ENCOUNTER — Telehealth: Payer: Self-pay | Admitting: Family Medicine

## 2016-07-14 NOTE — Telephone Encounter (Signed)
Initiated PA for Hyoscyamine sulfate on Cover my meds. Awaiting decision.

## 2016-07-14 NOTE — Telephone Encounter (Signed)
Patient has an appointment in January with Dr. Charlett Blake.

## 2016-07-17 NOTE — Telephone Encounter (Signed)
PA for Hyoscyamine Sulfate was denied due to not being covered under his plan due to medication not being approved by the FDA. Patient can call and ask for alternative, let me know

## 2016-07-18 NOTE — Telephone Encounter (Signed)
He should look at good Rx because this can be a very cheap med. He can check the website or we can try switching to bentyl 10 mg prn 1 bid prn. Disp #30 no rf may not be cheaper

## 2016-07-18 NOTE — Telephone Encounter (Signed)
Called left message to call back 

## 2016-07-19 NOTE — Telephone Encounter (Signed)
Patient is returning your call.  

## 2016-07-20 ENCOUNTER — Other Ambulatory Visit: Payer: Self-pay | Admitting: Family Medicine

## 2016-07-20 DIAGNOSIS — L989 Disorder of the skin and subcutaneous tissue, unspecified: Secondary | ICD-10-CM

## 2016-07-20 NOTE — Telephone Encounter (Signed)
Referral placed.

## 2016-07-20 NOTE — Telephone Encounter (Signed)
Patient returned my call and he is doing to look into the goodrx first.  Will pickup card in the morning. Also, he was seen in 07/12/2016 by Percell Miller and there was to be a dermatologist referral entered, but it did not happen.  I did see in his OV notes he was going to refer him to derm.  Do not know the protocol  By wondered if you could order his derm referral since [facility] is out of town until Monday.  Just let me know and will let the patient know.

## 2016-07-21 ENCOUNTER — Ambulatory Visit (INDEPENDENT_AMBULATORY_CARE_PROVIDER_SITE_OTHER): Payer: BLUE CROSS/BLUE SHIELD | Admitting: Family Medicine

## 2016-07-21 ENCOUNTER — Encounter: Payer: Self-pay | Admitting: Family Medicine

## 2016-07-21 ENCOUNTER — Other Ambulatory Visit: Payer: Self-pay | Admitting: Family Medicine

## 2016-07-21 VITALS — BP 128/78 | HR 79 | Temp 98.3°F | Ht 72.0 in | Wt 264.4 lb

## 2016-07-21 DIAGNOSIS — R200 Anesthesia of skin: Secondary | ICD-10-CM

## 2016-07-21 LAB — BASIC METABOLIC PANEL
BUN: 14 mg/dL (ref 6–23)
CHLORIDE: 103 meq/L (ref 96–112)
CO2: 24 mEq/L (ref 19–32)
CREATININE: 0.97 mg/dL (ref 0.40–1.50)
Calcium: 9.9 mg/dL (ref 8.4–10.5)
GFR: 105.91 mL/min (ref >60.00)
Glucose, Bld: 100 mg/dL — ABNORMAL HIGH (ref 70–99)
Potassium: 3.7 mEq/L (ref 3.5–5.1)
Sodium: 137 mEq/L (ref 135–145)

## 2016-07-21 LAB — VITAMIN B12: VITAMIN B 12: 513 pg/mL (ref 211–911)

## 2016-07-21 LAB — TSH: TSH: 1.61 u[IU]/mL (ref 0.35–4.50)

## 2016-07-21 LAB — FOLATE: Folate: 18 ng/mL (ref >5.9)

## 2016-07-21 MED ORDER — LOSARTAN POTASSIUM 50 MG PO TABS: 50.0000 mg | ORAL_TABLET | Freq: Every day | ORAL | 2 refills | Status: DC

## 2016-07-21 NOTE — Progress Notes (Signed)
Pre visit review using our clinic review tool, if applicable. No additional management support is needed unless otherwise documented below in the visit note. 

## 2016-07-21 NOTE — Patient Instructions (Addendum)
If your labs are normal, call your foot doctor and make an appointment. Bring your work boots to that appointment. Be mindful of how tight you are tying your work boots as well. If your foot doctor does believe this issue is within his scope of practice, follow up with [place].

## 2016-07-21 NOTE — Progress Notes (Signed)
Chief Complaint  [name] Foster presents with  . Numbness in feet    (B)-noticed last Sunday    Subjective: [name] Foster is a 50 y.o. male here for numbness in both feet.  L foot started around 2 weeks ago, R foot he noticed 5 days ago. The inside of his feet feel like they have decreased sensation. Nothing involved above the ankles. The pt does note that he got new work boots around 1 mo ago. He states that he does not tie his shoes particularly tight. He denies weakness, injury, vision changes.   ROS: Neuro: As noted in HPI  Family History  Problem Relation Age of Onset  . Dementia Mother   . Diabetes Mother   . Hypertension Mother   . Heart failure Mother   . Hypertension      siblings  . Colon cancer Neg Hx    Past Medical History:  Diagnosis Date  . Bone spur    left foot  . Diverticulosis   . Erectile dysfunction 01/15/2013  . Esophageal reflux 01/15/2013  . Fatty liver 07/04/2015  . GERD (gastroesophageal reflux disease)   . Hiatal hernia   . Hiatal hernia with gastroesophageal reflux 03/20/2014  . Hyperglycemia 07/13/2013  . Hyperplastic colon polyp   . Hypertension   . Internal hemorrhoids   . Mixed hyperlipidemia   . Murmur   . OSA (obstructive sleep apnea)    s/p UPPP  . Reflux esophagitis   . Stomach ulcer    from PCP  . Urinary hesitancy 09/30/2015   Allergies  Allergen Reactions  . Hydrocodone Itching    Current Outpatient Prescriptions:  .  acetaminophen (TYLENOL) 500 MG tablet, Take 1 tablet (500 mg total) by mouth every 6 (six) hours as needed., Disp: 60 tablet, Rfl: 0 .  aspirin EC 81 MG tablet, Take 81 mg by mouth daily., Disp: , Rfl:  .  atorvastatin (LIPITOR) 20 MG tablet, Take 1 tablet (20 mg total) by mouth daily., Disp: 30 tablet, Rfl: 5 .  clotrimazole-betamethasone (LOTRISONE) cream, Apply 1 application topically 2 (two) times daily., Disp: 45 g, Rfl: 2 .  esomeprazole (NEXIUM) 40 MG capsule, Take 1 capsule (40 mg total) by mouth daily., Disp: 30  capsule, Rfl: 3 .  fluticasone (FLONASE) 50 MCG/ACT nasal spray, Place 2 sprays into both nostrils daily., Disp: 16 g, Rfl: 6 .  hyoscyamine (LEVSIN SL) 0.125 MG SL tablet, Place 1 tablet (0.125 mg total) under the tongue every 4 (four) hours as needed for cramping., Disp: 40 tablet, Rfl: 1 .  ibuprofen (ADVIL,MOTRIN) 400 MG tablet, Take 1 tablet (400 mg total) by mouth every 8 (eight) hours as needed., Disp: 90 tablet, Rfl: 1 .  losartan (COZAAR) 25 MG tablet, Take 2 tablets (50 mg total) by mouth daily., Disp: 60 tablet, Rfl: 2 .  Multiple Vitamin (MULTIVITAMIN) tablet, Take 1 tablet by mouth daily., Disp: , Rfl:  .  NONFORMULARY OR COMPOUNDED ITEM, , Disp: , Rfl:  .  olopatadine (PATANOL) 0.1 % ophthalmic solution, Place 1 drop into both eyes 2 (two) times daily., Disp: 5 mL, Rfl: 1 .  ranitidine (ZANTAC) 300 MG tablet, Take 1 tablet (300 mg total) by mouth at bedtime., Disp: 30 tablet, Rfl: 2 .  tadalafil (CIALIS) 5 MG tablet, Take 1 tablet (5 mg total) by mouth daily as needed for erectile dysfunction., Disp: 30 tablet, Rfl: 5 .  triamterene-hydrochlorothiazide (MAXZIDE-25) 37.5-25 MG tablet, Take 1 tablet by mouth daily., Disp: 30 tablet, Rfl: 0 .  ammonium lactate (LAC-HYDRIN) 12 % lotion, Apply twice daily to affected areas. ([name] Foster not taking: Reported on 07/21/2016), Disp: 225 g, Rfl: 1  Objective: BP 128/78 (BP Location: Left Arm, [name] Foster Position: Sitting, Cuff Size: Large)   Pulse 79   Temp 98.3 F (36.8 C) (Oral)   Ht 6' (1.829 m)   Wt 264 lb 6.4 oz (119.9 kg)   SpO2 96%   BMI 35.86 kg/m  General: Awake, appears stated age Heart: Brisk cap refill, PT and DP pulses 1+ and 2+ b/l respectively Lungs: No accessory muscle use Neuro: Diminished, but intact, sensation to pinprick on medial portion of foot over L4 distribution b/l, normal over the L5 and S1 nerve root distribution Psych: Age appropriate judgment and insight, normal affect and mood  Assessment and Plan: Numbness -  Plan: TSH, Basic Metabolic Panel (BMET), 123456, Folate  Orders as above. R/o metabolic causes. Be mindful of how tight his new work boots are. F/u with podiatry if all else is normal. Return to [place] if podiatry does not have an answer, consider Neuro referral for EMG at that time. F/u prn. The [name] Foster voiced understanding and agreement to the plan.  [place], DO 07/21/16  10:07 AM

## 2016-08-02 ENCOUNTER — Encounter: Payer: BLUE CROSS/BLUE SHIELD | Admitting: Podiatry

## 2016-08-10 NOTE — Progress Notes (Signed)
This encounter was created in error - please disregard.

## 2016-08-17 ENCOUNTER — Ambulatory Visit (INDEPENDENT_AMBULATORY_CARE_PROVIDER_SITE_OTHER): Payer: BLUE CROSS/BLUE SHIELD | Admitting: Podiatry

## 2016-08-17 ENCOUNTER — Ambulatory Visit (INDEPENDENT_AMBULATORY_CARE_PROVIDER_SITE_OTHER): Payer: BLUE CROSS/BLUE SHIELD

## 2016-08-17 ENCOUNTER — Ambulatory Visit: Payer: BLUE CROSS/BLUE SHIELD

## 2016-08-17 ENCOUNTER — Encounter: Payer: Self-pay | Admitting: Podiatry

## 2016-08-17 VITALS — BP 154/91 | Resp 16

## 2016-08-17 DIAGNOSIS — M79672 Pain in left foot: Secondary | ICD-10-CM

## 2016-08-17 DIAGNOSIS — M7661 Achilles tendinitis, right leg: Secondary | ICD-10-CM | POA: Diagnosis not present

## 2016-08-17 DIAGNOSIS — M779 Enthesopathy, unspecified: Secondary | ICD-10-CM

## 2016-08-17 DIAGNOSIS — M7662 Achilles tendinitis, left leg: Secondary | ICD-10-CM | POA: Diagnosis not present

## 2016-08-17 MED ORDER — DICLOFENAC SODIUM 75 MG PO TBEC: 75.0000 mg | DELAYED_RELEASE_TABLET | Freq: Two times a day (BID) | ORAL | 2 refills | Status: DC

## 2016-08-17 NOTE — Patient Instructions (Signed)

## 2016-08-17 NOTE — Progress Notes (Signed)
Subjective:     Patient ID: [name] Foster, male   DOB: [birth year], 50 y.o.   MRN: [id number]  HPI patient presents stating the back of the left heel is been very tender and it's making it hard for me to wear shoe gear comfortably. States she's tried wider shoes and other treatments without relief and it simply not getting better and he'll probably need something done with long-term   Review of Systems     Objective:   Physical Exam Neurovascular status intact with exquisite discomfort posterior aspect left heel with possible change and spur formation and pain when palpated    Assessment:     Inflammatory Achilles tendinitis left with failure to respond so far to conservative treatment with mild numbness bilateral with no organic indications    Plan:     H&P conditions reviewed and for the left I've recommended the consideration of shockwave or possible open cutting surgery along with ice and stretching exercises. I do think the numbness will hopefully resolve over time but I don't see any organic cause at this time. I did dispense a silicone ankle brace and I want to see the results of this over the next 4 weeks and then we can discuss what is he wanted you long-term

## 2016-09-19 ENCOUNTER — Ambulatory Visit: Payer: Self-pay | Admitting: Family Medicine

## 2016-09-21 ENCOUNTER — Ambulatory Visit (INDEPENDENT_AMBULATORY_CARE_PROVIDER_SITE_OTHER): Payer: BLUE CROSS/BLUE SHIELD | Admitting: Family Medicine

## 2016-09-21 ENCOUNTER — Encounter: Payer: Self-pay | Admitting: Family Medicine

## 2016-09-21 VITALS — BP 128/88 | HR 56 | Temp 98.0°F | Wt 262.0 lb

## 2016-09-21 DIAGNOSIS — E782 Mixed hyperlipidemia: Secondary | ICD-10-CM | POA: Diagnosis not present

## 2016-09-21 DIAGNOSIS — K219 Gastro-esophageal reflux disease without esophagitis: Secondary | ICD-10-CM

## 2016-09-21 DIAGNOSIS — E669 Obesity, unspecified: Secondary | ICD-10-CM | POA: Diagnosis not present

## 2016-09-21 DIAGNOSIS — I10 Essential (primary) hypertension: Secondary | ICD-10-CM

## 2016-09-21 DIAGNOSIS — R0789 Other chest pain: Secondary | ICD-10-CM | POA: Diagnosis not present

## 2016-09-21 DIAGNOSIS — E1169 Type 2 diabetes mellitus with other specified complication: Secondary | ICD-10-CM | POA: Diagnosis not present

## 2016-09-21 DIAGNOSIS — K760 Fatty (change of) liver, not elsewhere classified: Secondary | ICD-10-CM

## 2016-09-21 DIAGNOSIS — R1310 Dysphagia, unspecified: Secondary | ICD-10-CM

## 2016-09-21 DIAGNOSIS — R350 Frequency of micturition: Secondary | ICD-10-CM | POA: Diagnosis not present

## 2016-09-21 DIAGNOSIS — R351 Nocturia: Secondary | ICD-10-CM

## 2016-09-21 DIAGNOSIS — R3911 Hesitancy of micturition: Secondary | ICD-10-CM | POA: Diagnosis not present

## 2016-09-21 DIAGNOSIS — M341 CR(E)ST syndrome: Secondary | ICD-10-CM

## 2016-09-21 DIAGNOSIS — E6609 Other obesity due to excess calories: Secondary | ICD-10-CM

## 2016-09-21 LAB — URINALYSIS
BILIRUBIN URINE: NEGATIVE
HGB URINE DIPSTICK: NEGATIVE
KETONES UR: NEGATIVE
Leukocytes, UA: NEGATIVE
Nitrite: NEGATIVE
Specific Gravity, Urine: 1.01 (ref 1.000–1.030)
TOTAL PROTEIN, URINE-UPE24: NEGATIVE
URINE GLUCOSE: NEGATIVE
Urobilinogen, UA: 0.2 (ref 0.0–1.0)
pH: 6 (ref 5.0–8.0)

## 2016-09-21 LAB — COMPREHENSIVE METABOLIC PANEL
ALK PHOS: 56 U/L (ref 39–117)
ALT: 32 U/L (ref 0–53)
AST: 24 U/L (ref 0–37)
Albumin: 4.5 g/dL (ref 3.5–5.2)
BUN: 14 mg/dL (ref 6–23)
CALCIUM: 9.5 mg/dL (ref 8.4–10.5)
CO2: 27 mEq/L (ref 19–32)
Chloride: 105 mEq/L (ref 96–112)
Creatinine, Ser: 1.02 mg/dL (ref 0.40–1.50)
GFR: 99.87 mL/min (ref >60.00)
Glucose, Bld: 98 mg/dL (ref 70–99)
Potassium: 3.9 mEq/L (ref 3.5–5.1)
Sodium: 140 mEq/L (ref 135–145)
TOTAL PROTEIN: 7.4 g/dL (ref 6.0–8.3)
Total Bilirubin: 0.5 mg/dL (ref 0.2–1.2)

## 2016-09-21 LAB — LIPID PANEL
CHOL/HDL RATIO: 5
Cholesterol: 221 mg/dL — ABNORMAL HIGH (ref 0–200)
HDL: 41.7 mg/dL (ref >39.00)
LDL CALC: 162 mg/dL — AB (ref 0–99)
NonHDL: 179.08
Triglycerides: 83 mg/dL (ref 0.0–149.0)
VLDL: 16.6 mg/dL (ref 0.0–40.0)

## 2016-09-21 LAB — CBC
HCT: 46.1 % (ref 39.0–52.0)
HEMOGLOBIN: 15.7 g/dL (ref 13.0–17.0)
MCHC: 34.1 g/dL (ref 30.0–36.0)
MCV: 84.7 fl (ref 78.0–100.0)
PLATELETS: 219 10*3/uL (ref 150.0–400.0)
RBC: 5.44 Mil/uL (ref 4.22–5.81)
RDW: 13.9 % (ref 11.5–15.5)
WBC: 4.4 10*3/uL (ref 4.0–10.5)

## 2016-09-21 LAB — PSA: PSA: 2.39 ng/mL (ref 0.10–4.00)

## 2016-09-21 LAB — TSH: TSH: 1.53 u[IU]/mL (ref 0.35–4.50)

## 2016-09-21 LAB — HEMOGLOBIN A1C: Hgb A1c MFr Bld: 6.2 % (ref 4.6–6.5)

## 2016-09-21 NOTE — Progress Notes (Signed)
Subjective:    Patient ID: [name] Foster, male    DOB: 02/16/1966, 51 y.o.   MRN: [id number]  Chief Complaint  Patient presents with  . Follow-up    HPI Patient is in today for follow up and he continues to feel poorly. He has persistent relux on a daily basis despite meds daily. He describes a burning in his chest after eating during the day. No associated SOB, palpitations diaphoresis. He also notes urinary frequency and urgency but he denies dysuria or hematuria. No recent hospitalizations or febrile illness. Denies palp/SOB/HA/congestion/fevers or GU c/o. Taking meds as prescribed  Past Medical History:  Diagnosis Date  . Bone spur    left foot  . Diabetes mellitus type 2 in obese ([place]) 07/13/2013  . Diverticulosis   . Erectile dysfunction 01/15/2013  . Esophageal reflux 01/15/2013  . Fatty liver 07/04/2015  . GERD (gastroesophageal reflux disease)   . Hiatal hernia   . Hiatal hernia with gastroesophageal reflux 03/20/2014  . Hyperglycemia 07/13/2013  . Hyperplastic colon polyp   . Hypertension   . Internal hemorrhoids   . Mixed hyperlipidemia   . Murmur   . OSA (obstructive sleep apnea)    s/p UPPP  . Reflux esophagitis   . Stomach ulcer    from PCP  . Urinary hesitancy 09/30/2015    Past Surgical History:  Procedure Laterality Date  . [place] STUDY N/A 02/28/2016   Procedure: [place] STUDY;  Surgeon: Jerene Bears, MD;  Location: [facility];  Service: Gastroenterology;  Laterality: N/A;  . ESOPHAGEAL MANOMETRY N/A 09/01/2013   Procedure: ESOPHAGEAL MANOMETRY (EM);  Surgeon: Sable Feil, MD;  Location: [facility];  Service: Endoscopy;  Laterality: N/A;  . ESOPHAGEAL MANOMETRY N/A 02/28/2016   Procedure: ESOPHAGEAL MANOMETRY (EM);  Surgeon: Jerene Bears, MD;  Location: [facility];  Service: Gastroenterology;  Laterality: N/A;  . TONSILLECTOMY    . UVULECTOMY      Family History  Problem Relation Age of Onset  . Dementia Mother   . Diabetes Mother   .  Hypertension Mother   . Heart failure Mother   . Hypertension      siblings  . Colon cancer Neg Hx     Social History   Social History  . Marital status: Married    Spouse name: N/A  . Number of children: N/A  . Years of education: N/A   Occupational History  . Bus Midwife   Social History Main Topics  . Smoking status: Never Smoker  . Smokeless tobacco: Never Used  . Alcohol use No  . Drug use: No  . Sexual activity: Yes   Other Topics Concern  . Not on file   Social History Narrative   Tobacco Use - No.    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 acetaminophen (TYLENOL) 500 MG tablet Take 1 tablet (500 mg total) by mouth every 6 (six) hours as needed. 60 tablet 0  .  ammonium lactate (LAC-HYDRIN) 12 % lotion Apply twice daily to affected areas. 225 g 1  . aspirin EC 81 MG tablet Take 81 mg by mouth daily.    [name] atorvastatin (LIPITOR) 20 MG tablet Take 1 tablet (20 mg total) by mouth daily. 30 tablet 5  . clotrimazole-betamethasone (LOTRISONE) cream Apply 1 application topically 2 (two) times daily. 45 g 2  . esomeprazole (NEXIUM) 40 MG capsule Take 1 capsule (40 mg total) by mouth daily. 30 capsule 3  . fluticasone (FLONASE) 50 MCG/ACT nasal spray Place 2 sprays into both nostrils daily. 16 g 6  . hyoscyamine (LEVSIN SL) 0.125 MG SL tablet Place 1 tablet (0.125 mg total) under the tongue every 4 (four) hours as needed for cramping. 40 tablet 1  . ibuprofen (ADVIL,MOTRIN) 400 MG tablet Take 1 tablet (400 mg total) by mouth every 8 (eight) hours as needed. 90 tablet 1  . losartan (COZAAR) 50 MG tablet Take 1 tablet  (50 mg total) by mouth daily. 90 tablet 2  . Multiple Vitamin (MULTIVITAMIN) tablet Take 1 tablet by mouth daily.    . NONFORMULARY OR COMPOUNDED ITEM     . olopatadine (PATANOL) 0.1 % ophthalmic solution Place 1 drop into both eyes 2 (two) times daily. 5 mL 1  . ranitidine (ZANTAC) 300 MG tablet Take 1 tablet (300 mg total) by mouth at bedtime. 30 tablet 2  . tadalafil (CIALIS) 5 MG tablet Take 1 tablet (5 mg total) by mouth daily as needed for erectile dysfunction. 30 tablet 5  . triamterene-hydrochlorothiazide (MAXZIDE-25) 37.5-25 MG tablet Take 1 tablet by mouth daily. 30 tablet 0  . diclofenac (VOLTAREN) 75 MG EC tablet Take 1 tablet (75 mg total) by mouth 2 (two) times daily. 50 tablet 2   No facility-administered medications prior to visit.     Allergies  Allergen Reactions  . Hydrocodone Itching    Review of Systems  Constitutional: Negative for fever and malaise/fatigue.  HENT: Negative for congestion.   Eyes: Negative for blurred vision.  Respiratory: Negative for cough and shortness of breath.   Cardiovascular: Positive for chest pain. Negative for palpitations and leg swelling.  Gastrointestinal: Positive for heartburn. Negative for abdominal pain, blood in stool, constipation, diarrhea, melena and vomiting.  Genitourinary: Positive for frequency and urgency. Negative for dysuria and hematuria.  Musculoskeletal: Positive for back pain.  Skin: Negative for rash.  Neurological: Negative for loss of consciousness and headaches.       Objective:    Physical Exam  Constitutional: He is oriented to person, place, and time. He appears well-developed and well-nourished. No distress.  HENT:  Head: Normocephalic and atraumatic.  Oropharynx erythematous  Eyes: Conjunctivae are normal.  Neck: Normal range of motion. No thyromegaly present.  Cardiovascular: Normal rate and regular rhythm.   Pulmonary/Chest: Effort normal and breath sounds normal. He has no wheezes.    Abdominal: Soft. Bowel sounds are normal. There is no tenderness.  Musculoskeletal: Normal range of motion. He exhibits no edema or deformity.  Neurological: He is alert and oriented to person, place, and time.  Skin: Skin is warm and dry. He is not diaphoretic.  Psychiatric: He has a normal mood and affect.    BP 128/88 (BP Location: Left Arm, Patient Position: Sitting, Cuff Size: Normal)   Pulse (!) 56   Temp 98 F (36.7 C) (Oral)   Wt 262 lb (118.8 kg)   SpO2 97%   BMI 35.53 kg/m  Wt Readings from Last 3 Encounters:  09/21/16 262 lb (118.8 kg)  07/21/16 264 lb 6.4 oz (119.9 kg)  07/12/16 265 lb 9.6 oz (120.5 kg)     Lab Results  Component Value Date   WBC 5.7 05/22/2016   HGB 15.4 05/22/2016   HCT 45.1 05/22/2016   PLT 191.0 05/22/2016   GLUCOSE 100 (H) 07/21/2016   CHOL 251 (H) 05/22/2016   TRIG 209.0 (H) 05/22/2016   HDL 40.10 05/22/2016   LDLDIRECT 173.0 05/22/2016   LDLCALC 174 (H) 09/30/2015   ALT 49 05/22/2016   AST 31 05/22/2016   NA 137 07/21/2016   K 3.7 07/21/2016   CL 103 07/21/2016   CREATININE 0.97 07/21/2016   BUN 14 07/21/2016   CO2 24 07/21/2016   TSH 1.61 07/21/2016   PSA 1.97 09/30/2015   HGBA1C 6.6 (H) 05/22/2016    Lab Results  Component Value Date   TSH 1.61 07/21/2016   Lab Results  Component Value Date   WBC 5.7 05/22/2016   HGB 15.4 05/22/2016   HCT 45.1 05/22/2016   MCV 84.8 05/22/2016   PLT 191.0 05/22/2016   Lab Results  Component Value Date   NA 137 07/21/2016   K 3.7 07/21/2016   CO2 24 07/21/2016   GLUCOSE 100 (H) 07/21/2016   BUN 14 07/21/2016   CREATININE 0.97 07/21/2016   BILITOT 0.3 05/22/2016   ALKPHOS 56 05/22/2016   AST 31 05/22/2016   ALT 49 05/22/2016   PROT 7.5 05/22/2016   ALBUMIN 4.5 05/22/2016   CALCIUM 9.9 07/21/2016   GFR 105.91 07/21/2016   Lab Results  Component Value Date   CHOL 251 (H) 05/22/2016   Lab Results  Component Value Date   HDL 40.10 05/22/2016   Lab Results  Component  Value Date   LDLCALC 174 (H) 09/30/2015   Lab Results  Component Value Date   TRIG 209.0 (H) 05/22/2016   Lab Results  Component Value Date   CHOLHDL 6 05/22/2016   Lab Results  Component Value Date   HGBA1C 6.6 (H) 05/22/2016       Assessment & Plan:   Problem List Items Addressed This Visit    Hyperlipidemia    Tolerating statin, encouraged heart healthy diet, avoid trans fats, minimize simple carbs and saturated fats. Increase exercise as tolerated      Relevant Orders   Lipid panel   Ambulatory referral to Cardiology   Essential hypertension    Well controlled, no changes to meds. Encouraged heart healthy diet such as the DASH diet and exercise as tolerated.       Relevant Orders   CBC   Comprehensive metabolic panel   TSH   Ambulatory referral to Cardiology   Esophageal reflux    Worsening symptoms restart probiotics and ranitidine and referred back to GI due to worsening symptoms and dysphagia.      Relevant Orders   Ambulatory referral to Gastroenterology   Obesity    Encouraged DASH diet, decrease po intake and increase exercise as tolerated. Needs 7-8 hours of sleep nightly. Avoid trans fats, eat small, frequent meals every 4-5 hours with lean proteins, complex carbs and healthy fats. Minimize simple carbs referred to bariatric clinic      Diabetes mellitus type 2 in obese ([place])    Most recent A1C 6.6 so now a new onset diabetic will check a1c and discussed need for glucometer and checking sugars daily and prn      Relevant Orders   Hemoglobin A1c   Scleroderma of  esophagus ([name])   Fatty liver   Urinary hesitancy   Relevant Orders   Urinalysis   PSA   Urine culture   Atypical chest pain - Primary    EKG today and referred back to cardiology. Patient feels well in office      Relevant Orders   EKG 12-Lead   Ambulatory referral to Cardiology   Gastroesophageal reflux disease without esophagitis    Other Visit Diagnoses    Nocturia        Relevant Orders   Urinalysis   PSA   Urine culture   Ambulatory referral to Cardiology   Urinary frequency       Relevant Orders   Urinalysis   PSA   Urine culture   Dysphagia, unspecified type       Relevant Orders   Ambulatory referral to Gastroenterology      I have discontinued Mr. Ury's diclofenac. I am also having him maintain his tadalafil, aspirin EC, multivitamin, NONFORMULARY OR COMPOUNDED ITEM, triamterene-hydrochlorothiazide, fluticasone, hyoscyamine, esomeprazole, ranitidine, ibuprofen, clotrimazole-betamethasone, acetaminophen, atorvastatin, olopatadine, ammonium lactate, and losartan.  Meds ordered this encounter  Medications  . DISCONTD: losartan (COZAAR) 25 MG tablet    Sig: Take 400 mg by mouth once.    Refill:  2    CMA served as Education administrator during this visit. History, Physical and Plan performed by medical provider. Documentation and orders reviewed and attested to.  Penni Homans, MD

## 2016-09-21 NOTE — Assessment & Plan Note (Signed)
Well controlled, no changes to meds. Encouraged heart healthy diet such as the DASH diet and exercise as tolerated.  °

## 2016-09-21 NOTE — Progress Notes (Signed)
Pre visit review using our clinic review tool, if applicable. No additional management support is needed unless otherwise documented below in the visit note. 

## 2016-09-21 NOTE — Assessment & Plan Note (Signed)
Encouraged DASH diet, decrease po intake and increase exercise as tolerated. Needs 7-8 hours of sleep nightly. Avoid trans fats, eat small, frequent meals every 4-5 hours with lean proteins, complex carbs and healthy fats. Minimize simple carbs referred to bariatric clinic

## 2016-09-21 NOTE — Assessment & Plan Note (Signed)
Most recent A1C 6.6 so now a new onset diabetic will check a1c and discussed need for glucometer and checking sugars daily and prn

## 2016-09-21 NOTE — Patient Instructions (Signed)
Lidocaine patches to back Take Ranitidine each evening and restart NOW probiotics Switch to cold brew coffee or stop altogether due to heartburn symptoms.    Carbohydrate Counting for Diabetes Mellitus, Adult Carbohydrate counting is a method for keeping track of how many carbohydrates you eat. Eating carbohydrates naturally increases the amount of sugar (glucose) in the blood. Counting how many carbohydrates you eat helps keep your blood glucose within normal limits, which helps you manage your diabetes (diabetes mellitus). It is important to know how many carbohydrates you can safely have in each meal. This is different for every person. A diet and nutrition specialist (registered dietitian) can help you make a meal plan and calculate how many carbohydrates you should have at each meal and snack. Carbohydrates are found in the following foods:  Grains, such as breads and cereals.  Dried beans and soy products.  Starchy vegetables, such as potatoes, peas, and corn.  Fruit and fruit juices.  Milk and yogurt.  Sweets and snack foods, such as cake, cookies, candy, chips, and soft drinks. How do I count carbohydrates? There are two ways to count carbohydrates in food. You can use either of the methods or a combination of both. Reading "Nutrition Facts" on packaged food  The "Nutrition Facts" list is included on the labels of almost all packaged foods and beverages in the [place] It includes:  The serving size.  Information about nutrients in each serving, including the grams (g) of carbohydrate per serving. To use the "Nutrition Facts":  Decide how many servings you will have.  Multiply the number of servings by the number of carbohydrates per serving.  The resulting number is the total amount of carbohydrates that you will be having. Learning standard serving sizes of other foods  When you eat foods containing carbohydrates that are not packaged or do not include "Nutrition Facts" on  the label, you need to measure the servings in order to count the amount of carbohydrates:  Measure the foods that you will eat with a food scale or measuring cup, if needed.  Decide how many standard-size servings you will eat.  Multiply the number of servings by 15. Most carbohydrate-rich foods have about 15 g of carbohydrates per serving.  For example, if you eat 8 oz (170 g) of strawberries, you will have eaten 2 servings and 30 g of carbohydrates (2 servings x 15 g = 30 g).  For foods that have more than one food mixed, such as soups and casseroles, you must count the carbohydrates in each food that is included. The following list contains standard serving sizes of common carbohydrate-rich foods. Each of these servings has about 15 g of carbohydrates:   hamburger bun or  English muffin.   oz (15 mL) syrup.   oz (14 g) jelly.  1 slice of bread.  1 six-inch tortilla.  3 oz (85 g) cooked rice or pasta.  4 oz (113 g) cooked dried beans.  4 oz (113 g) starchy vegetable, such as peas, corn, or potatoes.  4 oz (113 g) hot cereal.  4 oz (113 g) mashed potatoes or  of a large baked potato.  4 oz (113 g) canned or frozen fruit.  4 oz (120 mL) fruit juice.  4-6 crackers.  6 chicken nuggets.  6 oz (170 g) unsweetened dry cereal.  6 oz (170 g) plain fat-free yogurt or yogurt sweetened with artificial sweeteners.  8 oz (240 mL) milk.  8 oz (170 g) fresh fruit or one  small piece of fruit.  24 oz (680 g) popped popcorn. Example of carbohydrate counting Sample meal  3 oz (85 g) chicken breast.  6 oz (170 g) brown rice.  4 oz (113 g) corn.  8 oz (240 mL) milk.  8 oz (170 g) strawberries with sugar-free whipped topping. Carbohydrate calculation 1. Identify the foods that contain carbohydrates:  Rice.  Corn.  Milk.  Strawberries. 2. Calculate how many servings you have of each food:  2 servings rice.  1 serving corn.  1 serving milk.  1 serving  strawberries. 3. Multiply each number of servings by 15 g:  2 servings rice x 15 g = 30 g.  1 serving corn x 15 g = 15 g.  1 serving milk x 15 g = 15 g.  1 serving strawberries x 15 g = 15 g. 4. Add together all of the amounts to find the total grams of carbohydrates eaten:  30 g + 15 g + 15 g + 15 g = 75 g of carbohydrates total. This information is not intended to replace advice given to you by your health care provider. Make sure you discuss any questions you have with your health care provider. Document Released: 08/28/2005 Document Revised: 03/17/2016 Document Reviewed: 02/09/2016 Elsevier Interactive Patient Education  2017 [employer].

## 2016-09-21 NOTE — Assessment & Plan Note (Signed)
Tolerating statin, encouraged heart healthy diet, avoid trans fats, minimize simple carbs and saturated fats. Increase exercise as tolerated 

## 2016-09-21 NOTE — Assessment & Plan Note (Signed)
Worsening symptoms restart probiotics and ranitidine and referred back to GI due to worsening symptoms and dysphagia.

## 2016-09-21 NOTE — Assessment & Plan Note (Signed)
EKG today and referred back to cardiology. Patient feels well in office

## 2016-09-22 ENCOUNTER — Other Ambulatory Visit: Payer: Self-pay

## 2016-09-22 LAB — URINE CULTURE

## 2016-09-22 MED ORDER — ATORVASTATIN CALCIUM 40 MG PO TABS: 40.0000 mg | ORAL_TABLET | Freq: Every day | ORAL | 3 refills | Status: DC

## 2016-09-27 ENCOUNTER — Ambulatory Visit: Payer: Self-pay | Admitting: Internal Medicine

## 2016-09-27 ENCOUNTER — Ambulatory Visit: Payer: BLUE CROSS/BLUE SHIELD | Admitting: Cardiology

## 2016-10-23 ENCOUNTER — Encounter: Payer: Self-pay | Admitting: Cardiovascular Disease

## 2016-10-23 ENCOUNTER — Ambulatory Visit (INDEPENDENT_AMBULATORY_CARE_PROVIDER_SITE_OTHER): Payer: BLUE CROSS/BLUE SHIELD | Admitting: Cardiovascular Disease

## 2016-10-23 VITALS — BP 146/90 | HR 58 | Ht 72.0 in | Wt 254.8 lb

## 2016-10-23 DIAGNOSIS — E7800 Pure hypercholesterolemia, unspecified: Secondary | ICD-10-CM

## 2016-10-23 DIAGNOSIS — I10 Essential (primary) hypertension: Secondary | ICD-10-CM

## 2016-10-23 DIAGNOSIS — E6609 Other obesity due to excess calories: Secondary | ICD-10-CM

## 2016-10-23 DIAGNOSIS — R079 Chest pain, unspecified: Secondary | ICD-10-CM

## 2016-10-23 NOTE — Patient Instructions (Addendum)
Medication Instructions:  Your physician recommends that you continue on your current medications as directed. Please refer to the Current Medication list given to you today.  Labwork: none  Testing/Procedures: CARDIAC CTA   Follow-Up: Your physician recommends that you schedule a follow-up appointment in: 1 MONTH OV  Any Other Special Instructions Will Be Listed Below (If Applicable).  Monitor you blood pressure at home and keep a log. Bring to your follow up ov in 1 month  Cardiac CT Angiogram A cardiac CT angiogram is a test to help your health care provider find out why you are having chest pains or other symptoms of heart disease. The test uses an advanced type of X-ray machine that scans your heart and the area around the heart and creates multiple pictures of it. Other names for the test are coronary CT angiography, coronary artery scanning, and CTA.  The test is painless and fairly quick. It is noninvasive. That means it does not involve any type of surgery or cuts (incisions). Instead, a fluid called contrast dye is injected into an IV tube in your arm. The contrast dye acts as a highlighter as it flows through the veins. With the CT scan, it lets your health care provider see:   If the coronary arteries in your heart are more narrow than they should be, or if they are blocked.  If there is fluid around the heart.  If the muscles and tissues of the heart look weak or show signs of disease.  If the lungs contain any blood clots. LET [facility] CARE PROVIDER KNOW ABOUT:  Any allergies you have.   All medicines you are taking, including vitamins, herbs, eye drops, creams, and over-the-counter medicines.  Previous problems you or members of your family have had with the use of anesthetics.  Any blood disorders you have.  Previous surgeries you have had.  Medical conditions you have. RISKS AND COMPLICATIONS Generally, this is a safe procedure. However, as with any  procedure, problems can occur. Possible problems include:   Allergic reaction to the contrast dye. This can range from mild to severe and may include:   Itching at the IV tube insertion site.   Redness at the IV tube insertion site.   Hives.   Nausea.   Difficulty breathing.   Kidney failure.   Problems from radiation exposure. This test involves the use of radiation. Radiation exposure can be dangerous to a pregnant patient and fetus. If you are pregnant, shields are used to protect your belly and pelvic area. More details are available from your health care provider. BEFORE THE PROCEDURE  The day before the test:    Stop drinking caffeinated beverages. These include energy drinks, tea, soda, coffee, and hot chocolate.  Stop taking medicines to treat erectile dysfunction. They can interfere with medicines you may be given during the procedure. Check with your health care provider if you should stop taking any other medicines. On the day of the test:   About 4 hours before the test, stop eating and drinking anything but water as advised by your health care provider.  Avoid wearing jewelry. You will have to undress from the waist up and wear a hospital gown. PROCEDURE  The hair on your chest may need to be shaved. This is done because small sticky patches called electrodes are put on your chest. These transmit information that helps monitor your heart during the test.  You might be given heart medicine during the test. This is  done to control your heart rate during the test so a good image is obtained.  An IV tube will be inserted in your arm.  You will be asked to lie on a table with your arms above your head.  The contrast dye will be injected into the IV tube. You might feel warm or you may get a metallic taste in your mouth.  The table you are lying on will move into a large machine that will do the scanning.  You will be able to see, hear, and talk to the person  running the machine while you are in it. Follow that person's directions. You may be asked to hold your breath for 2-3 seconds as pictures are taken.  The CT machine will move around you to take pictures. Do not move while it is scanning. This helps to get a good image of your heart.  When the best possible pictures have been taken, the machine will be turned off. The table will move out of the machine. The IV tube will then be removed. AFTER THE PROCEDURE  You will be allowed to get dressed and return to your normal activities.  Results will be interpreted by the health care provider and the results will be discussed with you. This information is not intended to replace advice given to you by your health care provider. Make sure you discuss any questions you have with your health care provider. Document Released: 08/10/2008 Document Revised: 09/18/2014 Document Reviewed: 05/14/2013 Elsevier Interactive Patient Education  2017 [employer].   If you need a refill on your cardiac medications before your next appointment, please call your pharmacy.

## 2016-10-23 NOTE — Progress Notes (Signed)
Cardiology Office Note   Date:  10/23/2016   ID:  [name] Foster, DOB 05/15/1966, MRN [id number]  PCP:  [name] Homans, MD  Cardiologist:   [name] Latch, MD   No chief complaint on file.     History of Present Illness: [name] Foster is a 51 y.o. male with hypertension, hyperlipidemia, diabetes, GERD, OSA, and scleroderma of the esophagus who presents for an evaluation of atypical chest pain.  [name] Foster that for the last 2 months he gets substernal chest tightness when drinking. He Foster that after drinking coffee or other liquids.  There is no associated shortness of breath, nausea, or diaphoresis. The symptoms last for approximately 30 minutes and are 3/10 in severity. It never occurs with exertion.  [name] Foster does not get any regular exercise.  He denies lower extremity edema, orthopnea or PND.  [name] Foster previously had an echo 07/14/15 that revealed LVEF 55-60% and grade 1 diastolic dysfunction.  He was a patient of Dr. Aundra Foster, last seen 02/2014.  He had a cardiac CT 07/2012 that showed normal coronaries and a calcium score of 0.  [name] Foster saw his PCP, Dr. Penni Foster, on 09/21/16 and was referred to cardiology for further evaluation.  EKG at that time was unremarkable.  He reports that atorvastatin was recently increased due to his elevated lipids.  His maternal grandmother had a heart attack in her 43s and his mother developed heart failure in her 34s.  He checks his blood pressure at home and it is typically around 128/78.   Past Medical History:  Diagnosis Date  . Bone spur    left foot  . Diabetes mellitus type 2 in obese ([place]) 07/13/2013  . Diverticulosis   . Erectile dysfunction 01/15/2013  . Esophageal reflux 01/15/2013  . Fatty liver 07/04/2015  . GERD (gastroesophageal reflux disease)   . Hiatal hernia   . Hiatal hernia with gastroesophageal reflux 03/20/2014  . Hyperglycemia 07/13/2013  . Hyperplastic colon polyp   . Hypertension   . Internal hemorrhoids   . Mixed  hyperlipidemia   . Murmur   . OSA (obstructive sleep apnea)    s/p UPPP  . Reflux esophagitis   . Stomach ulcer    from PCP  . Urinary hesitancy 09/30/2015    Past Surgical History:  Procedure Laterality Date  . [place] STUDY N/A 02/28/2016   Procedure: [place] STUDY;  Surgeon: Jerene Bears, MD;  Location: [facility];  Service: Gastroenterology;  Laterality: N/A;  . ESOPHAGEAL MANOMETRY N/A 09/01/2013   Procedure: ESOPHAGEAL MANOMETRY (EM);  Surgeon: [name] Feil, MD;  Location: [facility];  Service: Endoscopy;  Laterality: N/A;  . ESOPHAGEAL MANOMETRY N/A 02/28/2016   Procedure: ESOPHAGEAL MANOMETRY (EM);  Surgeon: Jerene Bears, MD;  Location: [facility];  Service: Gastroenterology;  Laterality: N/A;  . TONSILLECTOMY    . UVULECTOMY       Current Outpatient Prescriptions  Medication Sig Dispense Refill  . acetaminophen (TYLENOL) 500 MG tablet Take 1 tablet (500 mg total) by mouth every 6 (six) hours as needed. 60 tablet 0  . ammonium lactate (LAC-HYDRIN) 12 % lotion Apply twice daily to affected areas. 225 g 1  . aspirin EC 81 MG tablet Take 81 mg by mouth daily.    [name] atorvastatin (LIPITOR) 40 MG tablet Take 1 tablet (40 mg total) by mouth daily. 90 tablet 3  . esomeprazole (NEXIUM) 40 MG capsule Take 1 capsule (40 mg total) by  mouth daily. 30 capsule 3  . fluticasone (FLONASE) 50 MCG/ACT nasal spray Place 2 sprays into both nostrils daily. 16 g 6  . ibuprofen (ADVIL,MOTRIN) 400 MG tablet Take 1 tablet (400 mg total) by mouth every 8 (eight) hours as needed. 90 tablet 1  . losartan (COZAAR) 50 MG tablet Take 1 tablet (50 mg total) by mouth daily. 90 tablet 2  . Multiple Vitamin (MULTIVITAMIN) tablet Take 1 tablet by mouth daily.    . NONFORMULARY OR COMPOUNDED ITEM     . olopatadine (PATANOL) 0.1 % ophthalmic solution Place 1 drop into both eyes 2 (two) times daily. 5 mL 1  . ranitidine (ZANTAC) 300 MG tablet Take 1 tablet (300 mg total) by mouth at bedtime. 30  tablet 2  . tadalafil (CIALIS) 5 MG tablet Take 1 tablet (5 mg total) by mouth daily as needed for erectile dysfunction. 30 tablet 5  . triamterene-hydrochlorothiazide (MAXZIDE-25) 37.5-25 MG tablet Take 1 tablet by mouth daily. 30 tablet 0   No current facility-administered medications for this visit.     Allergies:   Hydrocodone    Social History:  The patient  reports that he has never smoked. He has never used smokeless tobacco. He reports that he does not drink alcohol or use drugs.   Family History:  The patient's family history includes Dementia in his mother; Diabetes in his brother, mother, sister, and sister; Heart attack in his maternal grandmother; Heart failure in his mother; Hypertension in his mother; Pancreatic disease in his sister.    ROS:  Please see the history of present illness.   Otherwise, review of systems are positive for none.   All other systems are reviewed and negative.    PHYSICAL EXAM: VS:  BP (!) 146/90   Pulse (!) 58   Ht 6' (1.829 m)   Wt 115.6 kg (254 lb 12.8 oz)   BMI 34.56 kg/m  , BMI Body mass index is 34.56 kg/m. GENERAL:  Well appearing HEENT:  Pupils equal round and reactive, fundi not visualized, oral mucosa unremarkable NECK:  No jugular venous distention, waveform within normal limits, carotid upstroke brisk and symmetric, no bruits, no thyromegaly LYMPHATICS:  No cervical adenopathy LUNGS:  Clear to auscultation bilaterally HEART:  RRR.  PMI not displaced or sustained,S1 and S2 within normal limits, no S3, no S4, no clicks, no rubs, I/VI systolic murmur at the LUSB ABD:  Flat, positive bowel sounds normal in frequency in pitch, no bruits, no rebound, no guarding, no midline pulsatile mass, no hepatomegaly, no splenomegaly EXT:  2 plus pulses throughout, no edema, no cyanosis no clubbing SKIN:  No rashes no nodules NEURO:  Cranial nerves II through XII grossly intact, motor grossly intact throughout PSYCH:  Cognitively intact, oriented  to person place and time    EKG:  EKG is ordered today. The ekg ordered today demonstrates sinus bradycardia. Rate 53 bpm.  11/13: Cardiac CT. Normal cors. Ca++ score = 0.   Echo 07/14/15: Study Conclusions  - Left ventricle: The cavity size was normal. Wall thickness was   normal. Systolic function was normal. The estimated ejection   fraction was in the range of 55% to 60%. Wall motion was normal;   there were no regional wall motion abnormalities. Doppler   parameters are consistent with abnormal left ventricular   relaxation (grade 1 diastolic dysfunction).  Impressions:  - Normal LV systolic function; grade 1 diastolic dysfunction.  Recent Labs: 09/21/2016: ALT 32; BUN 14; Creatinine, Ser 1.02;  Hemoglobin 15.7; Platelets 219.0; Potassium 3.9; Sodium 140; TSH 1.53    Lipid Panel    Component Value Date/Time   CHOL 221 (H) 09/21/2016 0911   TRIG 83.0 09/21/2016 0911   HDL 41.70 09/21/2016 0911   CHOLHDL 5 09/21/2016 0911   VLDL 16.6 09/21/2016 0911   LDLCALC 162 (H) 09/21/2016 0911   LDLDIRECT 173.0 05/22/2016 1648      Wt Readings from Last 3 Encounters:  10/23/16 115.6 kg (254 lb 12.8 oz)  09/21/16 118.8 kg (262 lb)  07/21/16 119.9 kg (264 lb 6.4 oz)      ASSESSMENT AND PLAN:  # Atypical chest pain:  Given his coronary calcium score of 0 in 2013 and his very atypical symptoms,  it is unlikely that [name] Foster has obstructive coronary disease, but not impossible.  He does have a family history of premature CAD , hypertension and poorly-controlled hyperlipidemia.  We will get a cardiac CT-A to assess for obstructive coronary disease.    # Hypertension:  Blood pressure elevated initially and on repeat. He is certain that it is well-controlled at home.  Continue losartan, HCTZ and triamterene.  He will keep a log of his blood pressures and bring to follow-up.  # Hyperlipidemia:  LDL 173.  Atorvastatin appropriately increased.  # Obesity: [name] Foster was encouraged  to increase his physical activity to at least 30-40 minutes most days of the week.    Current medicines are reviewed at length with the patient today.  The patient does not have concerns regarding medicines.  The following changes have been made:  no change  Labs/ tests ordered today include:   Orders Placed This Encounter  Procedures  . CT CORONARY MORPH W/CTA COR W/SCORE W/CA W/CM &/OR WO/CM  . EKG 12-Lead     Disposition:   FU with [name] Monger C. Oval Linsey, MD, [facility] in 1 month    This note was written with the assistance of speech recognition software.  Please excuse any transcriptional errors.  Signed, Ivory Maduro C. Oval Linsey, MD, [facility]  10/23/2016 1:14 PM     [facility]

## 2016-10-24 ENCOUNTER — Encounter: Payer: Self-pay | Admitting: Cardiovascular Disease

## 2016-10-27 ENCOUNTER — Telehealth: Payer: Self-pay | Admitting: *Deleted

## 2016-10-27 DIAGNOSIS — Z01812 Encounter for preprocedural laboratory examination: Secondary | ICD-10-CM

## 2016-10-27 DIAGNOSIS — Z79899 Other long term (current) drug therapy: Secondary | ICD-10-CM

## 2016-10-27 NOTE — Telephone Encounter (Signed)
Left message to call back  Needs BMET for cardiac CTA

## 2016-10-27 NOTE — Telephone Encounter (Signed)
Spoke with patient and he will go Monday for labs

## 2016-10-27 NOTE — Telephone Encounter (Signed)
Follow up ° ° ° ° ° ° °Pt returning Melinda's call °

## 2016-11-02 ENCOUNTER — Encounter (INDEPENDENT_AMBULATORY_CARE_PROVIDER_SITE_OTHER): Payer: BLUE CROSS/BLUE SHIELD | Admitting: Family Medicine

## 2016-11-06 ENCOUNTER — Ambulatory Visit (HOSPITAL_COMMUNITY): Admission: RE | Admit: 2016-11-06 | Payer: BLUE CROSS/BLUE SHIELD | Source: Ambulatory Visit

## 2016-11-07 ENCOUNTER — Ambulatory Visit: Payer: Self-pay | Admitting: Family Medicine

## 2016-11-09 ENCOUNTER — Telehealth: Payer: Self-pay | Admitting: *Deleted

## 2016-11-09 NOTE — Telephone Encounter (Signed)
Patient cancelled Coronary CT  Left message to call back

## 2016-11-11 IMAGING — CR DG CHEST 2V
2 series · 2 of 2 positions shown · non-contrast
Comparison: 02/16/2014

CLINICAL DATA: Chest pain for several months. Pain with deep
breathing. History of hypertension.

EXAM:
CHEST  2 VIEW

[w chest pa]
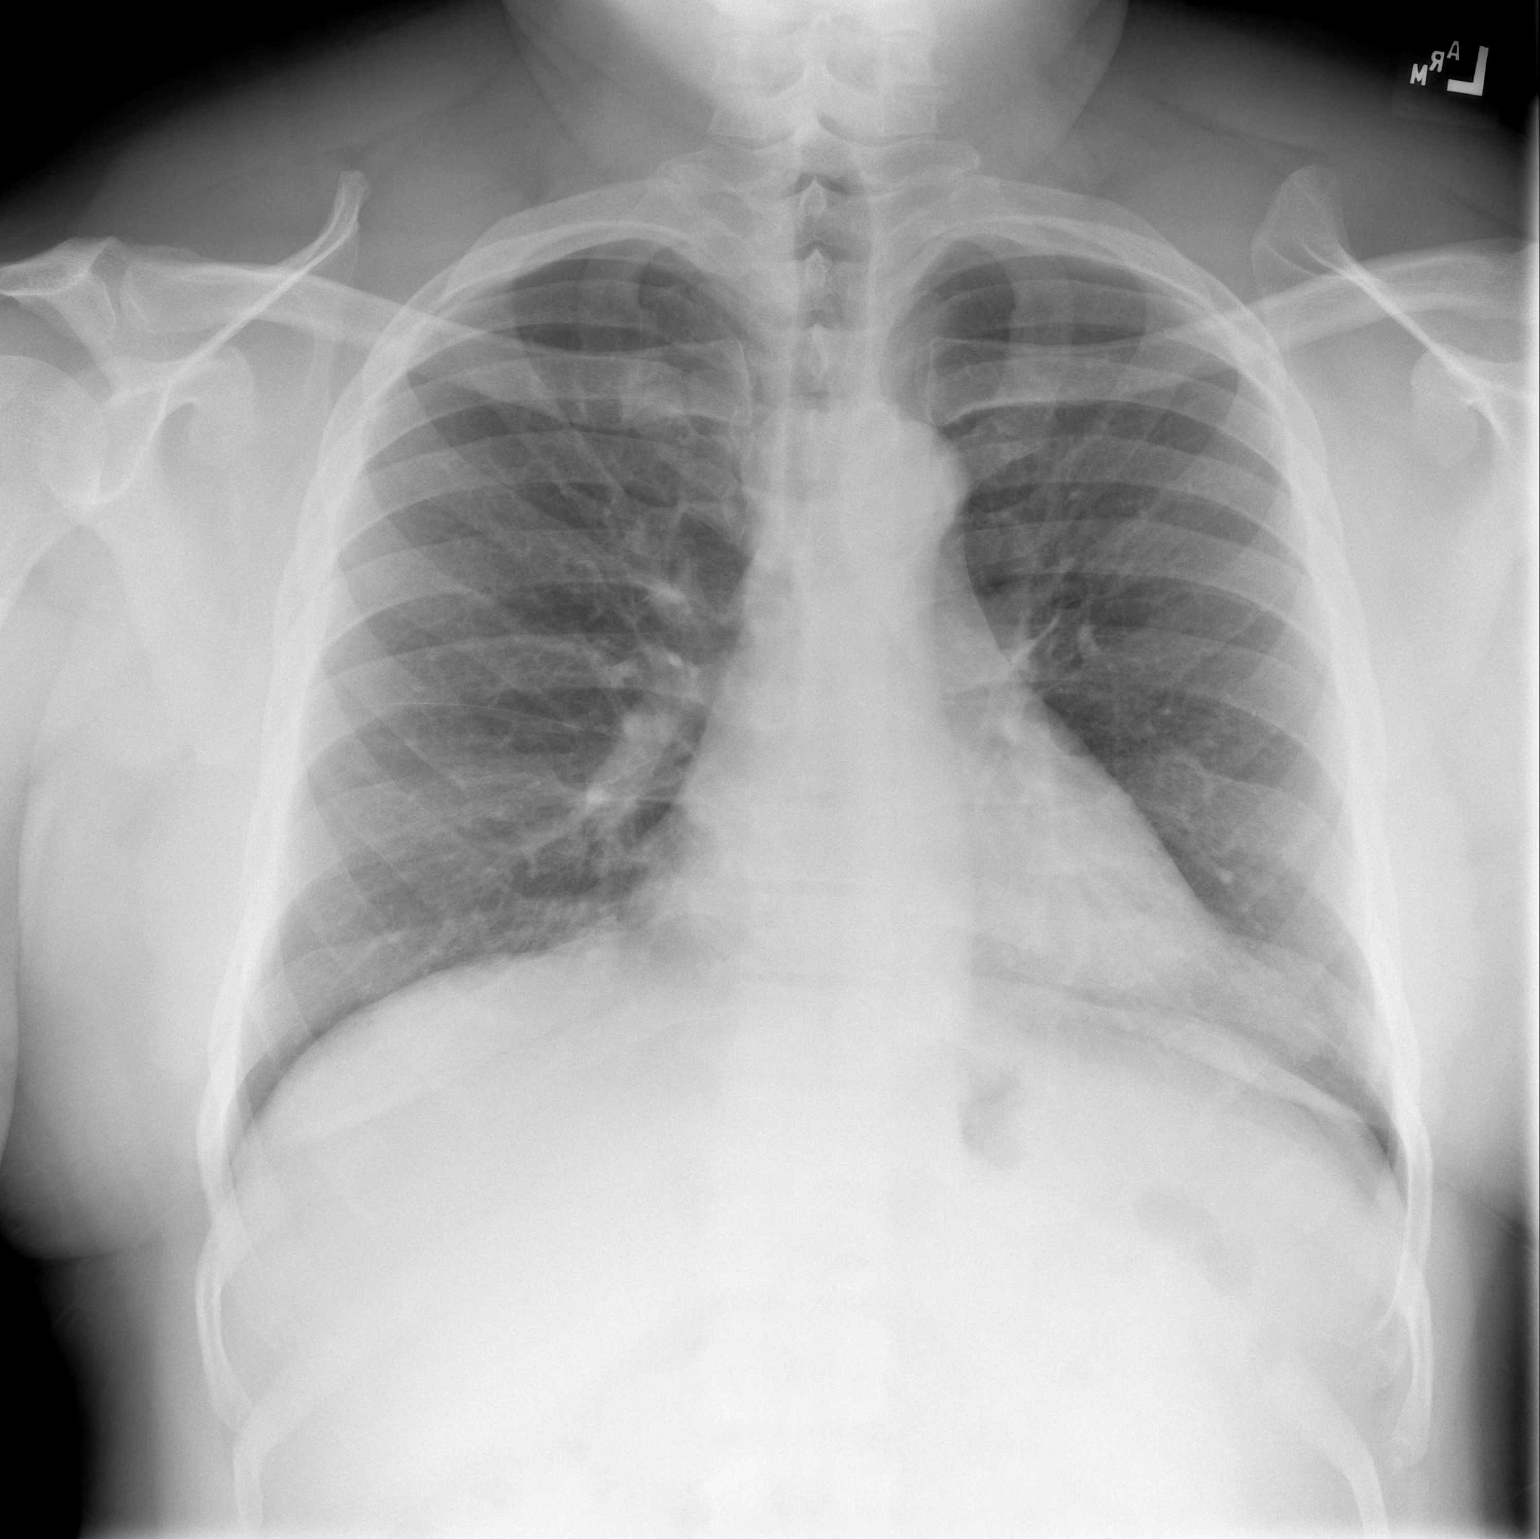

[w chest lat]
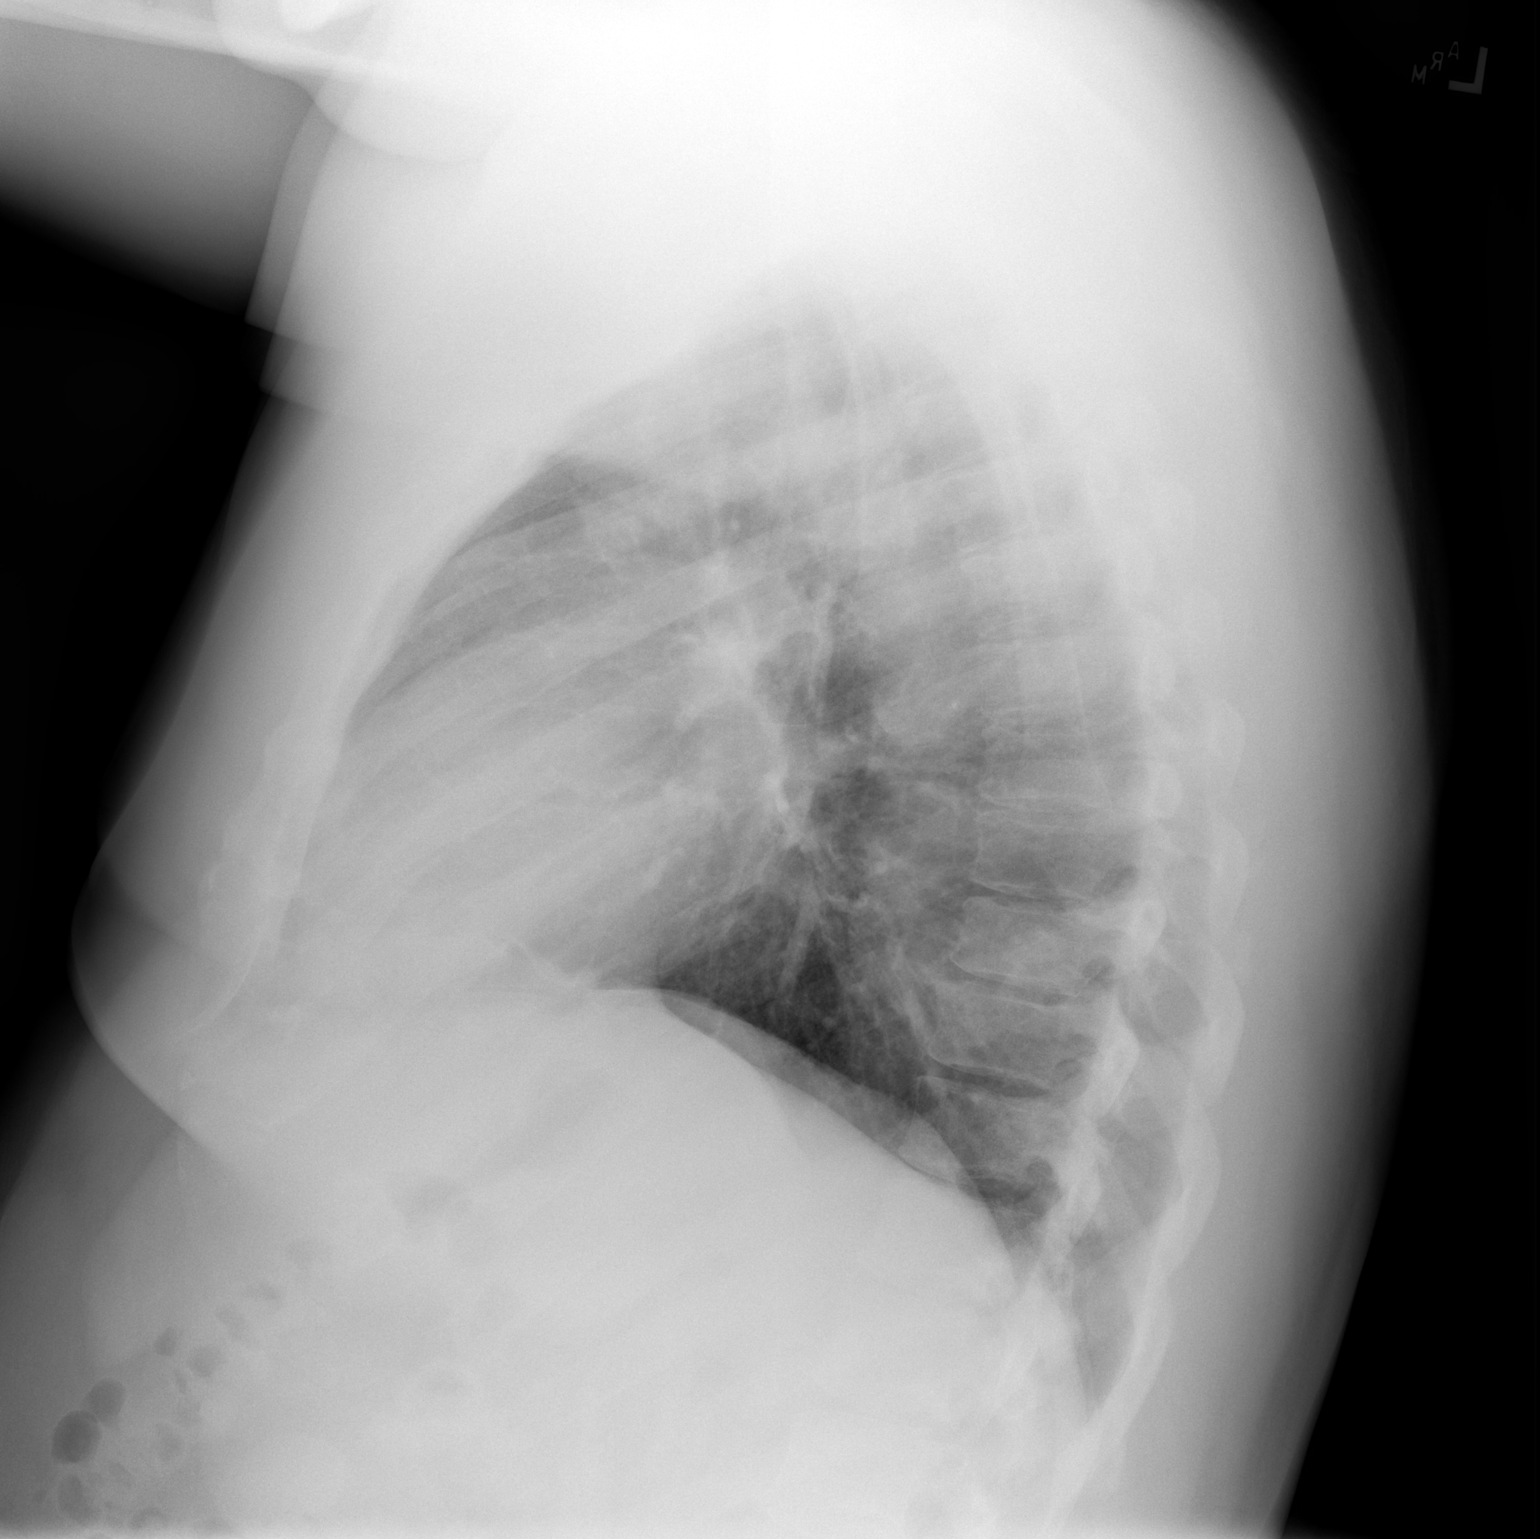

[2 of 2 positions shown; findings below may reference images not displayed]

FINDINGS: The heart size and mediastinal contours are within normal limits.
Both lungs are clear. No pleural effusion or pneumothorax. The
visualized skeletal structures are unremarkable.
IMPRESSION: No active cardiopulmonary disease.

## 2016-11-14 ENCOUNTER — Ambulatory Visit: Payer: Self-pay | Admitting: Family Medicine

## 2016-11-21 ENCOUNTER — Ambulatory Visit (INDEPENDENT_AMBULATORY_CARE_PROVIDER_SITE_OTHER): Payer: BLUE CROSS/BLUE SHIELD | Admitting: Internal Medicine

## 2016-11-21 VITALS — BP 144/84 | HR 80 | Ht 70.75 in | Wt 258.5 lb

## 2016-11-21 DIAGNOSIS — K219 Gastro-esophageal reflux disease without esophagitis: Secondary | ICD-10-CM | POA: Diagnosis not present

## 2016-11-21 DIAGNOSIS — R12 Heartburn: Secondary | ICD-10-CM | POA: Diagnosis not present

## 2016-11-21 DIAGNOSIS — R0789 Other chest pain: Secondary | ICD-10-CM

## 2016-11-21 MED ORDER — DICYCLOMINE HCL 20 MG PO TABS: 20.0000 mg | ORAL_TABLET | Freq: Three times a day (TID) | ORAL | 5 refills | Status: DC | PRN

## 2016-11-21 MED ORDER — AMITRIPTYLINE HCL 10 MG PO TABS: 10.0000 mg | ORAL_TABLET | Freq: Every day | ORAL | 5 refills | Status: DC

## 2016-11-21 MED ORDER — RANITIDINE HCL 300 MG PO TABS: 300.0000 mg | ORAL_TABLET | Freq: Every day | ORAL | 5 refills | Status: DC

## 2016-11-21 NOTE — Progress Notes (Signed)
Subjective:    Patient ID: [name] Foster, male    DOB: 15-May-1966, 51 y.o.   MRN: [id number]  HPI [name] Foster is a 51 year old male with a past medical history of GERD with functional heartburn, atypical chest pain, hypertension, hyperlipidemia and OSA who is here for follow-up.  He reports he has continued to have atypical chest pain and pressure in his mid to lower chest. This does not seem to be exertional. It comes and goes. It is worse with eating and drinking liquids. He notices it for equally when driving. Not associated with nausea, vomiting or weight loss. No early satiety. Bowel habits a been regular. No blood in his stool or melena.  He was using Dexilant each morning and Zantac at bedtime which worked well to control heartburn but not always helping the atypical chest pain. Dexilant was switched to Nexium due to insurance coverage. He also is using Levsin which he did feel helped significant with the atypical chest discomfort. He has run out of this medicine and it is also no longer covered by his insurance. I prescribed amitriptyline for functional heartburn and hypersensitive esophagus after a 24-hour pH and impedance test was performed in June 2017. This showed no evidence of pathologic reflux and poor symptom correlation with atypical chest pain. The amitriptyline at 25 mg he only took twice because he woke up feeling dizzy and "loopy".  He had colonoscopy performed for screening in May 2017 which revealed a rectal polyp which was hyperplastic. There was scattered diverticuli from ascending to sigmoid colon and internal hemorrhoids.  Review of Systems As per history of present illness, otherwise negative  Current Medications, Allergies, Past Medical History, Past Surgical History, Family History and Social History were reviewed in [employer] record.     Objective:   Physical Exam BP (!) 144/84 (BP Location: Left Arm, Patient Position: Sitting, Cuff Size:  Large)   Pulse 80   Ht 5' 10.75" (1.797 m) Comment: height measured without shoes  Wt 258 lb 8 oz (117.3 kg)   BMI 36.31 kg/m  Constitutional: Well-developed and well-nourished. No distress. HEENT: Normocephalic and atraumatic.  Conjunctivae are normal.  No scleral icterus. Neck: Neck supple. Trachea midline. Cardiovascular: Normal rate, regular rhythm and intact distal pulses. No M/R/G Pulmonary/chest: Effort normal and breath sounds normal. No wheezing, rales or rhonchi. Abdominal: Soft, obese, nontender, nondistended. Bowel sounds active throughout.  Extremities: no clubbing, cyanosis, or edema Lymphadenopathy: No cervical adenopathy noted. Neurological: Alert and oriented to person place and time. Skin: Skin is warm and dry.  Psychiatric: Normal mood and affect. Behavior is normal.  CBC    Component Value Date/Time   WBC 4.4 09/21/2016 0911   RBC 5.44 09/21/2016 0911   HGB 15.7 09/21/2016 0911   HCT 46.1 09/21/2016 0911   PLT 219.0 09/21/2016 0911   MCV 84.7 09/21/2016 0911   MCH 28.7 05/05/2014 1518   MCHC 34.1 09/21/2016 0911   RDW 13.9 09/21/2016 0911   LYMPHSABS 2.3 03/23/2015 1257   MONOABS 0.4 03/23/2015 1257   EOSABS 0.1 03/23/2015 1257   BASOSABS 0.0 03/23/2015 1257   CMP     Component Value Date/Time   NA 140 09/21/2016 0911   K 3.9 09/21/2016 0911   CL 105 09/21/2016 0911   CO2 27 09/21/2016 0911   GLUCOSE 98 09/21/2016 0911   BUN 14 09/21/2016 0911   CREATININE 1.02 09/21/2016 0911   CREATININE 1.08 05/05/2014 1518   CALCIUM 9.5 09/21/2016 0911  PROT 7.4 09/21/2016 0911   ALBUMIN 4.5 09/21/2016 0911   AST 24 09/21/2016 0911   ALT 32 09/21/2016 0911   ALKPHOS 56 09/21/2016 0911   BILITOT 0.5 09/21/2016 0911   GFRNONAA >90 06/26/2012 1350   GFRNONAA >60 01/04/2011 0911   GFRAA >90 06/26/2012 1350   GFRAA >60 01/04/2011 0911       Assessment & Plan:  51 year old male with a past medical history of GERD with functional heartburn, atypical chest  pain, hypertension, hyperlipidemia and OSA who is here for follow-up.  1. GERD with function heartburn and Esophageal hypersensitivity --we went over his prior upper GI testing including EGD, manometry and 24-hour impedance testing. I explained that his atypical chest pain is most likely to be hypersensitive esophagus and functional heartburn. He has also had a cardiology workup which was unrevealing. He has good control of his acid reflux with PPI and H2 blocker at night and thus we will continue Nexium 40 mg each morning and Zantac 300 milligrams at bedtime. I will prescribe Bentyl 20 mg to be used 3 times a day to help with pain and spasm, given that Levsin now not covered by his insurance help for this symptom previously. Finally I will lower the dose of amitriptyline as this medication can be quite helpful in functional heartburn but he did not tolerate the 25 mg dose. Trial of amitriptyline 10 mg daily at bedtime which we may titrate up in the future. Follow-up with me in 3 months, sooner necessary  25 minutes spent with the patient today. Greater than 50% was spent in counseling and coordination of care with the patient

## 2016-11-21 NOTE — Patient Instructions (Signed)
We have sent the following medications to your pharmacy for you to pick up at your convenience: Zantac Bentyl Amitriptyline  Please stay on Nexium every morning and Zantac every night.  Please follow up with Dr Hilarie Fredrickson in 3 months.  If you are age 49 or older, your body mass index should be between 23-30. Your Body mass index is 36.31 kg/m. If this is out of the aforementioned range listed, please consider follow up with your Primary Care Provider.  If you are age 18 or younger, your body mass index should be between 19-25. Your Body mass index is 36.31 kg/m. If this is out of the aformentioned range listed, please consider follow up with your Primary Care Provider.

## 2016-11-27 ENCOUNTER — Other Ambulatory Visit (INDEPENDENT_AMBULATORY_CARE_PROVIDER_SITE_OTHER): Payer: Self-pay | Admitting: Family Medicine

## 2016-11-27 ENCOUNTER — Encounter (INDEPENDENT_AMBULATORY_CARE_PROVIDER_SITE_OTHER): Payer: Self-pay | Admitting: Family Medicine

## 2016-11-27 ENCOUNTER — Ambulatory Visit (INDEPENDENT_AMBULATORY_CARE_PROVIDER_SITE_OTHER): Payer: BLUE CROSS/BLUE SHIELD | Admitting: Family Medicine

## 2016-11-27 ENCOUNTER — Ambulatory Visit: Payer: BLUE CROSS/BLUE SHIELD | Admitting: Cardiovascular Disease

## 2016-11-27 VITALS — BP 137/75 | HR 58 | Temp 98.6°F | Resp 22 | Ht 71.0 in | Wt 252.0 lb

## 2016-11-27 DIAGNOSIS — Z9189 Other specified personal risk factors, not elsewhere classified: Secondary | ICD-10-CM

## 2016-11-27 DIAGNOSIS — Z1331 Encounter for screening for depression: Secondary | ICD-10-CM

## 2016-11-27 DIAGNOSIS — Z1389 Encounter for screening for other disorder: Secondary | ICD-10-CM

## 2016-11-27 DIAGNOSIS — R0602 Shortness of breath: Secondary | ICD-10-CM | POA: Insufficient documentation

## 2016-11-27 DIAGNOSIS — IMO0001 Reserved for inherently not codable concepts without codable children: Secondary | ICD-10-CM

## 2016-11-27 DIAGNOSIS — Z0289 Encounter for other administrative examinations: Secondary | ICD-10-CM

## 2016-11-27 DIAGNOSIS — R5383 Other fatigue: Secondary | ICD-10-CM | POA: Diagnosis not present

## 2016-11-27 DIAGNOSIS — E785 Hyperlipidemia, unspecified: Secondary | ICD-10-CM | POA: Diagnosis not present

## 2016-11-27 DIAGNOSIS — E669 Obesity, unspecified: Secondary | ICD-10-CM

## 2016-11-27 DIAGNOSIS — E119 Type 2 diabetes mellitus without complications: Secondary | ICD-10-CM

## 2016-11-27 DIAGNOSIS — Z6835 Body mass index (BMI) 35.0-35.9, adult: Secondary | ICD-10-CM

## 2016-11-27 NOTE — Progress Notes (Signed)
Office: [phone]  /  Fax: [phone]   HPI:   Chief Complaint: OBESITY  [name] Foster (MR# [id number]) is a 51 y.o. male who presents on 11/27/2016 for obesity evaluation and treatment. Current BMI is Body mass index is 35.15 kg/m.[name] [name] Foster has struggled with obesity for years and has been unsuccessful in either losing weight or maintaining long term weight loss. [name] Foster attended our information session and states he is currently in the action stage of change and ready to dedicate time achieving and maintaining a healthier weight.  [name] Foster states his family eats meals together he thinks his family will eat healthier with  him his desired weight loss is 54 lbs he started gaining weight 3 years ago his heaviest weight ever was 260 lbs. he has significant food cravings issues  he snacks frequently in the evenings he wakes up frquently in the middle of the night to eat he skips meals frequently he is trying to eat vegetarian he is trying to eat vegan he is frequently drinking liquids with calories he frequently makes poor food choices he has problems with excessive hunger  he frequently eats larger portions than normal  he has binge eating behaviors   Fatigue [name] Foster feels his energy is lower than it should be. This has worsened with weight gain and has not worsened recently. [name] Foster admits to daytime somnolence and  admits to waking up still tired. Patient is at risk for obstructive sleep apnea. Patent has a history of symptoms of daytime fatigue, Epworth sleepiness scale and morning headache. Patient generally gets 4 or 5 hours of sleep per night, and states they generally have restless sleep. Snoring is present. Apneic episodes are present. Epworth Sleepiness Score is 13  Dyspnea on exertion [name] Foster notes increasing shortness of breath with exercising and seems to be worsening over time with weight gain. He notes getting out of breath sooner with activity than he used to. This has not  gotten worse recently. [name] Foster denies orthopnea.  Hyperlipidemia [name] Foster has hyperlipidemia and has been trying to improve his cholesterol levels with intensive lifestyle modification including a low saturated fat diet, exercise and weight loss. He is currently on Lipitor. He denies any chest pain, claudication or myalgias.  At risk for cardiovascular disease [name] Foster is at a higher than average risk for cardiovascular disease due to obesity. He currently denies any chest pain.  Diabetes II [name] Foster has a diagnosis of diabetes type II. [name] Foster states he is not checking blood sugars at home and denies any hypoglycemic episodes. [name] Foster had A1c was at 6.6 in September 2017 and is currently not on medications. He has been working on intensive lifestyle modifications including diet, exercise, and weight loss to help control his blood glucose levels.  Depression Screen [name] Foster's Food and Mood (modified PHQ-9) score was  Depression screen PHQ 2/9 11/27/2016  Decreased Interest 2  Down, Depressed, Hopeless 1  PHQ - 2 Score 3  Altered sleeping 3  Tired, decreased energy 3  Change in appetite 0  Feeling bad or failure about yourself  0  Trouble concentrating 1  Suicidal thoughts 0  PHQ-9 Score 10    ALLERGIES: Allergies  Allergen Reactions  . Hydrocodone Itching    MEDICATIONS: Current Outpatient Prescriptions on File Prior to Visit  Medication Sig Dispense Refill  . acetaminophen (TYLENOL) 500 MG tablet Take 1 tablet (500 mg total) by mouth every 6 (six) hours as needed. 60 tablet 0  . amitriptyline (ELAVIL) 10 MG tablet Take 1 tablet (10  mg total) by mouth at bedtime. 30 tablet 5  . aspirin EC 81 MG tablet Take 81 mg by mouth daily.    [name] atorvastatin (LIPITOR) 40 MG tablet Take 1 tablet (40 mg total) by mouth daily. 90 tablet 3  . dicyclomine (BENTYL) 20 MG tablet Take 1 tablet (20 mg total) by mouth 3 (three) times daily as needed for spasms. 90 tablet 5  . esomeprazole (NEXIUM) 40 MG  capsule Take 1 capsule (40 mg total) by mouth daily. 30 capsule 3  . fluticasone (FLONASE) 50 MCG/ACT nasal spray Place 2 sprays into both nostrils daily. 16 g 6  . losartan (COZAAR) 50 MG tablet Take 1 tablet (50 mg total) by mouth daily. 90 tablet 2  . Multiple Vitamin (MULTIVITAMIN) tablet Take 1 tablet by mouth daily.    . NONFORMULARY OR COMPOUNDED ITEM     . olopatadine (PATANOL) 0.1 % ophthalmic solution Place 1 drop into both eyes 2 (two) times daily. 5 mL 1  . ranitidine (ZANTAC) 300 MG tablet Take 1 tablet (300 mg total) by mouth at bedtime. 30 tablet 5  . tadalafil (CIALIS) 5 MG tablet Take 1 tablet (5 mg total) by mouth daily as needed for erectile dysfunction. 30 tablet 5  . triamterene-hydrochlorothiazide (MAXZIDE-25) 37.5-25 MG tablet Take 1 tablet by mouth daily. 30 tablet 0   No current facility-administered medications on file prior to visit.     PAST MEDICAL HISTORY: Past Medical History:  Diagnosis Date  . Bone spur    left foot  . Chest pain   . Diabetes mellitus type 2 in obese ([place]) 07/13/2013  . Diverticulosis   . Erectile dysfunction 01/15/2013  . Esophageal reflux 01/15/2013  . Fatty liver 07/04/2015  . GERD (gastroesophageal reflux disease)   . Hiatal hernia   . Hiatal hernia with gastroesophageal reflux 03/20/2014  . Hyperglycemia 07/13/2013  . Hyperplastic colon polyp   . Hypertension   . Internal hemorrhoids   . Mixed hyperlipidemia   . Murmur   . OSA (obstructive sleep apnea)    s/p UPPP  . Plantar fasciitis   . Prediabetes   . Reflux esophagitis   . Stomach ulcer    from PCP  . Urinary hesitancy 09/30/2015    PAST SURGICAL HISTORY: Past Surgical History:  Procedure Laterality Date  . [place] STUDY N/A 02/28/2016   Procedure: [place] STUDY;  Surgeon: Jerene Bears, MD;  Location: [facility];  Service: Gastroenterology;  Laterality: N/A;  . ESOPHAGEAL MANOMETRY N/A 09/01/2013   Procedure: ESOPHAGEAL MANOMETRY (EM);  Surgeon: Sable Feil, MD;  Location: [facility];  Service: Endoscopy;  Laterality: N/A;  . ESOPHAGEAL MANOMETRY N/A 02/28/2016   Procedure: ESOPHAGEAL MANOMETRY (EM);  Surgeon: Jerene Bears, MD;  Location: [facility];  Service: Gastroenterology;  Laterality: N/A;  . TONSILLECTOMY    . UVULECTOMY      SOCIAL HISTORY: Social History  Substance Use Topics  . Smoking status: Never Smoker  . Smokeless tobacco: Never Used  . Alcohol use No    FAMILY HISTORY: Family History  Problem Relation Age of Onset  . Dementia Mother   . Diabetes Mother   . Hypertension Mother   . Heart failure Mother   . Stroke Mother   . Obesity Mother   . Hypertension      siblings  . Diabetes Sister   . Diabetes Brother   . Heart attack Maternal Grandmother   . Diabetes Sister   .  Pancreatic disease Sister   . Obesity Father   . Colon cancer Neg Hx     ROS: Review of Systems  Constitutional: Positive for malaise/fatigue.  HENT: Positive for sinus pain.   Eyes:       Wear Glasses or Contacts  Cardiovascular: Positive for chest pain (chest pain/discomfort). Negative for orthopnea and claudication.       Chest Tightness Sudden Awakening From Sleep with Shortness of Breath  Gastrointestinal: Positive for heartburn.  Musculoskeletal: Positive for back pain. Negative for myalgias.  Skin: Positive for itching and rash.       Dryness  Neurological: Positive for headaches.  Endo/Heme/Allergies:       Negative Hypoglycemia  Psychiatric/Behavioral: The patient has insomnia.     PHYSICAL EXAM: Blood pressure 137/75, pulse (!) 58, temperature 98.6 F (37 C), temperature source Oral, resp. rate (!) 22, height 5\' 11"  (1.803 m), weight 252 lb (114.3 kg), SpO2 97 %. Body mass index is 35.15 kg/m. Physical Exam  Constitutional: He is oriented to person, place, and time. He appears well-developed and well-nourished.  Cardiovascular: Normal rate.   Pulmonary/Chest: Effort normal.  Musculoskeletal: Normal range  of motion.  Neurological: He is oriented to person, place, and time.  Skin: Skin is warm and dry.  Psychiatric: He has a normal mood and affect. His behavior is normal.  Vitals reviewed.   RECENT LABS AND TESTS: BMET    Component Value Date/Time   NA 140 09/21/2016 0911   K 3.9 09/21/2016 0911   CL 105 09/21/2016 0911   CO2 27 09/21/2016 0911   GLUCOSE 98 09/21/2016 0911   BUN 14 09/21/2016 0911   CREATININE 1.02 09/21/2016 0911   CREATININE 1.08 05/05/2014 1518   CALCIUM 9.5 09/21/2016 0911   GFRNONAA >90 06/26/2012 1350   GFRNONAA >60 01/04/2011 0911   GFRAA >90 06/26/2012 1350   GFRAA >60 01/04/2011 0911   Lab Results  Component Value Date   HGBA1C 6.2 09/21/2016   No results found for: INSULIN CBC    Component Value Date/Time   WBC 4.4 09/21/2016 0911   RBC 5.44 09/21/2016 0911   HGB 15.7 09/21/2016 0911   HCT 46.1 09/21/2016 0911   PLT 219.0 09/21/2016 0911   MCV 84.7 09/21/2016 0911   MCH 28.7 05/05/2014 1518   MCHC 34.1 09/21/2016 0911   RDW 13.9 09/21/2016 0911   LYMPHSABS 2.3 03/23/2015 1257   MONOABS 0.4 03/23/2015 1257   EOSABS 0.1 03/23/2015 1257   BASOSABS 0.0 03/23/2015 1257   Iron/TIBC/Ferritin/ %Sat No results found for: IRON, TIBC, FERRITIN, IRONPCTSAT Lipid Panel     Component Value Date/Time   CHOL 221 (H) 09/21/2016 0911   TRIG 83.0 09/21/2016 0911   HDL 41.70 09/21/2016 0911   CHOLHDL 5 09/21/2016 0911   VLDL 16.6 09/21/2016 0911   LDLCALC 162 (H) 09/21/2016 0911   LDLDIRECT 173.0 05/22/2016 1648   Hepatic Function Panel     Component Value Date/Time   PROT 7.4 09/21/2016 0911   ALBUMIN 4.5 09/21/2016 0911   AST 24 09/21/2016 0911   ALT 32 09/21/2016 0911   ALKPHOS 56 09/21/2016 0911   BILITOT 0.5 09/21/2016 0911   BILIDIR 0.1 05/05/2014 1518   IBILI 0.3 05/05/2014 1518      Component Value Date/Time   TSH 1.53 09/21/2016 0911   TSH 1.61 07/21/2016 0939   TSH 2.59 05/22/2016 1648    ECG  shows NSR with a rate of 53  BPM INDIRECT CALORIMETER done today shows  a VO2 of 263 and a REE of 1828.    ASSESSMENT AND PLAN: Other fatigue - Plan: Vitamin B12, CBC With Differential, Comprehensive metabolic panel, Folate, VITAMIN D 25 Hydroxy (Vit-D Deficiency, Fractures), T4, free, T3, TSH  Shortness of breath on exertion  Hyperlipidemia, unspecified hyperlipidemia type - Plan: Lipid Panel With LDL/HDL Ratio  Type 2 diabetes mellitus without complication, without long-term current use of insulin ([name]) - Plan: Insulin, random, Hemoglobin A1c, Microalbumin / creatinine urine ratio  Depression screening  At risk for heart disease  Class 2 obesity with serious comorbidity and body mass index (BMI) of 35.0 to 35.9 in adult, unspecified obesity type  PLAN:  Fatigue [name] Foster was informed that his fatigue may be related to obesity, depression or many other causes. Labs will be ordered, and in the meanwhile [name] Foster has agreed to work on diet, exercise and weight loss to help with fatigue. Proper sleep hygiene was discussed including the need for 7-8 hours of quality sleep each night. A sleep study was not ordered based on symptoms and Epworth score.  Dyspnea on exertion [name] Foster's shortness of breath appears to be obesity related and exercise induced. He has agreed to work on weight loss and gradually increase exercise to treat his exercise induced shortness of breath. If [name] Foster follows our instructions and loses weight without improvement of his shortness of breath, we will plan to refer to pulmonology. We will monitor this condition regularly. [name] Foster agrees to this plan.  Hyperlipidemia [name] Foster was informed of the American Heart Association Guidelines emphasizing intensive lifestyle modifications as the first line treatment for hyperlipidemia. We discussed many lifestyle modifications today in depth, and [name] Foster will continue to work on decreasing saturated fats such as fatty red meat, butter and many fried foods. He will  also increase vegetables and lean protein in his diet and continue to work on exercise and weight loss efforts. We will check labs and follow.  Cardiovascular risk counselling [name] Foster was given extended (at least 15 minutes) coronary artery disease prevention counseling today. He is 51 y.o. male and has risk factors for heart disease including obesity. We discussed intensive lifestyle modifications today with an emphasis on specific weight loss instructions and strategies. Pt was also informed of the importance of increasing exercise and decreasing saturated fats to help prevent heart disease.  Diabetes II [name] Foster has been given extensive diabetes education by myself today including ideal fasting and post-prandial blood glucose readings, individual ideal Hgb A1c goals  and hypoglycemia prevention. We discussed the importance of good blood sugar control to decrease the likelihood of diabetic complications such as nephropathy, neuropathy, limb loss, blindness, coronary artery disease, and death. We discussed the importance of intensive lifestyle modification including diet, exercise and weight loss as the first line treatment for diabetes. We will check labs and [name] Foster agrees to follow up at the agreed upon time.  Depression Screen [name] Foster had a moderately positive depression screening. Depression is commonly associated with obesity and often results in emotional eating behaviors. We will monitor this closely and work on CBT to help improve the non-hunger eating patterns. Referral to Psychology may be required if no improvement is seen as he continues in our clinic.  Obesity [name] Foster is currently in the action stage of change and his goal is to continue with weight loss efforts He has agreed to follow the Category 3 plan Tamir has been instructed to work up to a goal of 150 minutes of combined cardio and strengthening exercise per week for weight  loss and overall health benefits. We discussed the following  Behavioral Modification Stratagies today: increasing lean protein intake, decreasing simple carbohydrates , increasing vegetables, decreasing sodium intake and decrease eating out  Kenyatta has agreed to follow up with our clinic in 2 weeks. He was informed of the importance of frequent follow up visits to maximize his success with intensive lifestyle modifications for his multiple health conditions. He was informed we would discuss his lab results at his next visit unless there is a critical issue that needs to be addressed sooner. Georgie agreed to keep his next visit at the agreed upon time to discuss these results.  I, Doreene Nest, am acting as scribe for Dennard Nip, MD  I have reviewed the above documentation for accuracy and completeness, and I agree with the above. -Dennard Nip, MD

## 2016-11-28 LAB — MICROALBUMIN / CREATININE URINE RATIO
Creatinine, Urine: 47 mg/dL
MICROALB/CREAT RATIO: 18.7 mg/g{creat} (ref 0.0–30.0)
MICROALBUM., U, RANDOM: 8.8 ug/mL

## 2016-11-30 LAB — LIPID PANEL WITH LDL/HDL RATIO
Cholesterol, Total: 168 mg/dL (ref 100–199)
HDL: 38 mg/dL — ABNORMAL LOW (ref >39)
LDL Calculated: 112 mg/dL — ABNORMAL HIGH (ref 0–99)
LDl/HDL Ratio: 2.9 ratio units (ref 0.0–3.6)
Triglycerides: 88 mg/dL (ref 0–149)
VLDL Cholesterol Cal: 18 mg/dL (ref 5–40)

## 2016-11-30 LAB — COMPREHENSIVE METABOLIC PANEL
ALBUMIN: 4.6 g/dL (ref 3.5–5.5)
ALK PHOS: 76 IU/L (ref 39–117)
ALT: 37 IU/L (ref 0–44)
AST: 27 IU/L (ref 0–40)
Albumin/Globulin Ratio: 1.6 (ref 1.2–2.2)
BUN / CREAT RATIO: 18 (ref 9–20)
BUN: 17 mg/dL (ref 6–24)
Bilirubin Total: 0.4 mg/dL (ref 0.0–1.2)
CO2: 17 mmol/L — AB (ref 18–29)
CREATININE: 0.94 mg/dL (ref 0.76–1.27)
Calcium: 9.7 mg/dL (ref 8.7–10.2)
Chloride: 101 mmol/L (ref 96–106)
GFR calc non Af Amer: 95 mL/min/{1.73_m2} (ref >59)
GFR, EST AFRICAN AMERICAN: 110 mL/min/{1.73_m2} (ref >59)
GLOBULIN, TOTAL: 2.8 g/dL (ref 1.5–4.5)
GLUCOSE: 85 mg/dL (ref 65–99)
Potassium: 4 mmol/L (ref 3.5–5.2)
SODIUM: 141 mmol/L (ref 134–144)
TOTAL PROTEIN: 7.4 g/dL (ref 6.0–8.5)

## 2016-11-30 LAB — CBC WITH DIFFERENTIAL
BASOS ABS: 0 10*3/uL (ref 0.0–0.2)
Basos: 0 %
EOS (ABSOLUTE): 0.1 10*3/uL (ref 0.0–0.4)
EOS: 2 %
HEMATOCRIT: 46.5 % (ref 37.5–51.0)
HEMOGLOBIN: 15.8 g/dL (ref 13.0–17.7)
IMMATURE GRANS (ABS): 0 10*3/uL (ref 0.0–0.1)
IMMATURE GRANULOCYTES: 0 %
LYMPHS ABS: 1.8 10*3/uL (ref 0.7–3.1)
LYMPHS: 40 %
MCH: 28.7 pg (ref 26.6–33.0)
MCHC: 34 g/dL (ref 31.5–35.7)
MCV: 85 fL (ref 79–97)
MONOCYTES: 6 %
Monocytes Absolute: 0.3 10*3/uL (ref 0.1–0.9)
NEUTROS ABS: 2.3 10*3/uL (ref 1.4–7.0)
Neutrophils: 52 %
RBC: 5.5 x10E6/uL (ref 4.14–5.80)
RDW: 14.1 % (ref 12.3–15.4)
WBC: 4.5 10*3/uL (ref 3.4–10.8)

## 2016-11-30 LAB — VITAMIN B12: Vitamin B-12: 676 pg/mL (ref 232–1245)

## 2016-11-30 LAB — T4, FREE: FREE T4: 1.15 ng/dL (ref 0.82–1.77)

## 2016-11-30 LAB — TSH: TSH: 1.61 u[IU]/mL (ref 0.450–4.500)

## 2016-11-30 LAB — HEMOGLOBIN A1C
ESTIMATED AVERAGE GLUCOSE: 128 mg/dL
Hgb A1c MFr Bld: 6.1 % — ABNORMAL HIGH (ref 4.8–5.6)

## 2016-11-30 LAB — FOLATE: Folate: 12.1 ng/mL (ref >3.0)

## 2016-11-30 LAB — VITAMIN D 25 HYDROXY (VIT D DEFICIENCY, FRACTURES): VIT D 25 HYDROXY: 18.5 ng/mL — AB (ref 30.0–100.0)

## 2016-11-30 LAB — INSULIN, RANDOM: INSULIN: 28.3 u[IU]/mL — ABNORMAL HIGH (ref 2.6–24.9)

## 2016-11-30 LAB — T3: T3 TOTAL: 136 ng/dL (ref 71–180)

## 2016-12-07 ENCOUNTER — Telehealth: Payer: Self-pay | Admitting: Internal Medicine

## 2016-12-07 DIAGNOSIS — G4733 Obstructive sleep apnea (adult) (pediatric): Secondary | ICD-10-CM

## 2016-12-07 NOTE — Telephone Encounter (Signed)
Called and spoke with pt and he is aware of the order that will be sent to [facility] to reduce his cpap setting to 8.  Pt is aware that this order will be sent in.  Nothing further is needed.

## 2016-12-07 NOTE — Telephone Encounter (Signed)
CY  Please Advise-   Pt is concerned because he has been having on going chest pain, he went to Urgent Care on 11/18/16 and he saw GI on 11/21/16 and also saw his cardiologists on 10/23/16 all for this but pt has no relief. Pt is concerned that it might be due to his cpap machine. He says the chest pain has no gotten worse and pain is across his upper chest. Says it comes and goes and it does hurt for him to take a deep breath. Denies numbness or tingling, or coughing, or worsening  breathing during the day. He wanted to know what should his next steps be, should his cpap machine pressure be adjusted.

## 2016-12-07 NOTE — Telephone Encounter (Signed)
Order- DME [facility]- please reduce CPAP to 8 cwp  Dx OSA

## 2016-12-11 ENCOUNTER — Other Ambulatory Visit: Payer: Self-pay | Admitting: Family Medicine

## 2016-12-11 ENCOUNTER — Ambulatory Visit (INDEPENDENT_AMBULATORY_CARE_PROVIDER_SITE_OTHER): Payer: BLUE CROSS/BLUE SHIELD | Admitting: Family Medicine

## 2016-12-11 VITALS — BP 134/78 | HR 55 | Temp 97.7°F | Ht 71.0 in | Wt 240.0 lb

## 2016-12-11 DIAGNOSIS — E669 Obesity, unspecified: Secondary | ICD-10-CM

## 2016-12-11 DIAGNOSIS — Z6833 Body mass index (BMI) 33.0-33.9, adult: Secondary | ICD-10-CM

## 2016-12-11 DIAGNOSIS — Z9189 Other specified personal risk factors, not elsewhere classified: Secondary | ICD-10-CM | POA: Diagnosis not present

## 2016-12-11 DIAGNOSIS — E559 Vitamin D deficiency, unspecified: Secondary | ICD-10-CM

## 2016-12-11 DIAGNOSIS — E119 Type 2 diabetes mellitus without complications: Secondary | ICD-10-CM | POA: Diagnosis not present

## 2016-12-11 MED ORDER — METFORMIN HCL 500 MG PO TABS: 500.0000 mg | ORAL_TABLET | Freq: Every morning | ORAL | 0 refills | Status: DC

## 2016-12-11 MED ORDER — PROBIOTIC DAILY PO CAPS: ORAL_CAPSULE | ORAL | 1 refills | Status: DC

## 2016-12-11 MED ORDER — VITAMIN D (ERGOCALCIFEROL) 1.25 MG (50000 UNIT) PO CAPS
50000.0000 [IU] | ORAL_CAPSULE | ORAL | 0 refills | Status: DC
Start: 2016-12-11 — End: 2017-01-24

## 2016-12-11 NOTE — Telephone Encounter (Signed)
Called patient. Pt states he used to take an OTC probiotic, but is now requesting a Rx for NOW probiotic so that it may be covered by his insurance.   Patient Instructions by [name], MD at 09/21/2016 8:00 AM   Author: [name], MD Author Type: Physician Filed: 09/21/2016 8:59 AM  Note Status: Signed Cosign: Cosign Not Required Encounter Date: 09/21/2016  Editor: [name], MD (Physician)    Lidocaine patches to back Take Ranitidine each evening and restart NOW probiotics Switch to cold brew coffee or stop altogether due to heartburn symptoms.     Order for Now Probiotics as noted above. Probiotics sent to [facility] as request by patient.

## 2016-12-11 NOTE — Telephone Encounter (Signed)
Caller name: Nizar Relation to pt: self Call back number: [phone] Pharmacy: [facility]  Reason for call: Pt states that is needing a prescription sent to pharmacy for Probiotic since he would like it to be covered with insurance and pay the difference with [employer]. Please advise.

## 2016-12-11 NOTE — Progress Notes (Signed)
Office: [phone]  /  Fax: [phone]   HPI:   Chief Complaint: OBESITY [name] Foster is here to discuss his progress with his obesity treatment plan. He is following his eating plan approximately 100 % of the time and states he is exercising 0 minutes 0 times per week. [name] Foster did well with weight loss on category 3 plan. He noted some family sabotage. He noted polyphagia, especially in the evening. His weight is 240 lb (108.9 kg) today and has had a weight loss of 12 pounds over a period of 2 weeks since his last visit. He has lost 12 lbs since starting treatment with [place].  Vitamin D deficiency [name] Foster has a diagnosis of vitamin D deficiency, low at 37. He is currently on a multi vitamin, He is not currently taking vit D. He admits fatigue and denies nausea, vomiting or muscle weakness.  Diabetes II [name] Foster has a diagnosis of diabetes type II with elevated A1c last year of 6.6, now improved with diet. [name] Foster does note polyphagia and denies any hypoglycemic episodes, polyuria or polydipsia. He has been working on intensive lifestyle modifications including diet, exercise, and weight loss to help control his blood glucose levels.  At risk for cardiovascular disease [name] Foster is at a higher than average risk for cardiovascular disease due to diabetes and obesity. He currently denies any chest pain.   Wt Readings from Last 500 Encounters:  12/11/16 240 lb (108.9 kg)  11/27/16 252 lb (114.3 kg)  11/21/16 258 lb 8 oz (117.3 kg)  10/23/16 254 lb 12.8 oz (115.6 kg)  09/21/16 262 lb (118.8 kg)  07/21/16 264 lb 6.4 oz (119.9 kg)  07/12/16 265 lb 9.6 oz (120.5 kg)  05/22/16 271 lb (122.9 kg)  03/06/16 263 lb (119.3 kg)  01/31/16 266 lb (120.7 kg)  01/17/16 266 lb 9.6 oz (120.9 kg)  01/17/16 266 lb 3.2 oz (120.7 kg)  12/29/15 255 lb (115.7 kg)  09/30/15 264 lb (119.7 kg)  08/26/15 266 lb (120.7 kg)  07/06/15 262 lb 6 oz (119 kg)  06/24/15 259 lb 2 oz (117.5 kg)  05/10/15 259 lb 12.8 oz (117.8  kg)  03/22/15 256 lb 2 oz (116.2 kg)  07/23/14 250 lb (113.4 kg)  06/26/14 257 lb 9.6 oz (116.8 kg)  05/05/14 257 lb (116.6 kg)  03/20/14 258 lb (117 kg)  03/06/14 253 lb 1.3 oz (114.8 kg)  02/16/14 256 lb (116.1 kg)  02/13/14 254 lb 12 oz (115.6 kg)  10/30/13 256 lb 1.3 oz (116.2 kg)  10/14/13 254 lb (115.2 kg)  10/10/13 254 lb (115.2 kg)  08/20/13 250 lb (113.4 kg)  08/14/13 250 lb 2 oz (113.5 kg)  08/12/13 249 lb 1.9 oz (113 kg)  07/08/13 251 lb (113.9 kg)  02/04/13 245 lb 12.8 oz (111.5 kg)  01/14/13 249 lb 1.3 oz (113 kg)  11/16/12 257 lb (116.6 kg)  10/14/12 249 lb 6.4 oz (113.1 kg)  10/07/12 252 lb (114.3 kg)  08/30/12 245 lb (111.1 kg)  08/16/12 248 lb (112.5 kg)  07/30/12 248 lb (112.5 kg)  07/23/12 248 lb 12.8 oz (112.9 kg)  06/26/12 240 lb (108.9 kg)  02/28/12 242 lb (109.8 kg)  02/06/12 241 lb 1.3 oz (109.4 kg)  12/20/11 238 lb 1.9 oz (108 kg)  10/25/11 242 lb 1.3 oz (109.8 kg)  09/15/11 238 lb (108 kg)  04/29/11 240 lb (108.9 kg)  01/04/11 (!) 240 lb (108.9 kg)  01/04/11 (!) 239 lb (108.4 kg)  04/25/10 (!) 228 lb  4 oz (103.5 kg)  01/05/10 (!) 229 lb 8 oz (104.1 kg)  10/08/09 (!) 234 lb (106.1 kg)     ALLERGIES: Allergies  Allergen Reactions  . Hydrocodone Itching    MEDICATIONS: Current Outpatient Prescriptions on File Prior to Visit  Medication Sig Dispense Refill  . acetaminophen (TYLENOL) 500 MG tablet Take 1 tablet (500 mg total) by mouth every 6 (six) hours as needed. 60 tablet 0  . amitriptyline (ELAVIL) 10 MG tablet Take 1 tablet (10 mg total) by mouth at bedtime. 30 tablet 5  . aspirin EC 81 MG tablet Take 81 mg by mouth daily.    [name] atorvastatin (LIPITOR) 40 MG tablet Take 1 tablet (40 mg total) by mouth daily. 90 tablet 3  . dicyclomine (BENTYL) 20 MG tablet Take 1 tablet (20 mg total) by mouth 3 (three) times daily as needed for spasms. 90 tablet 5  . esomeprazole (NEXIUM) 40 MG capsule Take 1 capsule (40 mg total) by mouth daily. 30  capsule 3  . fluticasone (FLONASE) 50 MCG/ACT nasal spray Place 2 sprays into both nostrils daily. 16 g 6  . losartan (COZAAR) 50 MG tablet Take 1 tablet (50 mg total) by mouth daily. 90 tablet 2  . Multiple Vitamin (MULTIVITAMIN) tablet Take 1 tablet by mouth daily.    . NONFORMULARY OR COMPOUNDED ITEM     . olopatadine (PATANOL) 0.1 % ophthalmic solution Place 1 drop into both eyes 2 (two) times daily. 5 mL 1  . ranitidine (ZANTAC) 300 MG tablet Take 1 tablet (300 mg total) by mouth at bedtime. 30 tablet 5  . tadalafil (CIALIS) 5 MG tablet Take 1 tablet (5 mg total) by mouth daily as needed for erectile dysfunction. 30 tablet 5  . triamterene-hydrochlorothiazide (MAXZIDE-25) 37.5-25 MG tablet Take 1 tablet by mouth daily. 30 tablet 0   No current facility-administered medications on file prior to visit.     PAST MEDICAL HISTORY: Past Medical History:  Diagnosis Date  . Bone spur    left foot  . Chest pain   . Diabetes mellitus type 2 in obese ([name] Foster) 07/13/2013  . Diverticulosis   . Erectile dysfunction 01/15/2013  . Esophageal reflux 01/15/2013  . Fatty liver 07/04/2015  . GERD (gastroesophageal reflux disease)   . Hiatal hernia   . Hiatal hernia with gastroesophageal reflux 03/20/2014  . Hyperglycemia 07/13/2013  . Hyperplastic colon polyp   . Hypertension   . Internal hemorrhoids   . Mixed hyperlipidemia   . Murmur   . OSA (obstructive sleep apnea)    s/p UPPP  . Plantar fasciitis   . Prediabetes   . Reflux esophagitis   . Stomach ulcer    from PCP  . Urinary hesitancy 09/30/2015    PAST SURGICAL HISTORY: Past Surgical History:  Procedure Laterality Date  . [place] STUDY N/A 02/28/2016   Procedure: [place] STUDY;  Surgeon: Jerene Bears, MD;  Location: [facility];  Service: Gastroenterology;  Laterality: N/A;  . ESOPHAGEAL MANOMETRY N/A 09/01/2013   Procedure: ESOPHAGEAL MANOMETRY (EM);  Surgeon: Sable Feil, MD;  Location: [facility];  Service: Endoscopy;   Laterality: N/A;  . ESOPHAGEAL MANOMETRY N/A 02/28/2016   Procedure: ESOPHAGEAL MANOMETRY (EM);  Surgeon: Jerene Bears, MD;  Location: [facility];  Service: Gastroenterology;  Laterality: N/A;  . TONSILLECTOMY    . UVULECTOMY      SOCIAL HISTORY: Social History  Substance Use Topics  . Smoking status: Never Smoker  . Smokeless  tobacco: Never Used  . Alcohol use No    FAMILY HISTORY: Family History  Problem Relation Age of Onset  . Dementia Mother   . Diabetes Mother   . Hypertension Mother   . Heart failure Mother   . Stroke Mother   . Obesity Mother   . Hypertension      siblings  . Diabetes Sister   . Diabetes Brother   . Heart attack Maternal Grandmother   . Diabetes Sister   . Pancreatic disease Sister   . Obesity Father   . Colon cancer Neg Hx     ROS: Review of Systems  Constitutional: Positive for malaise/fatigue and weight loss.  Gastrointestinal: Negative for nausea and vomiting.  Genitourinary: Negative for frequency.  Musculoskeletal:       Negative muscle weakness  Endo/Heme/Allergies: Negative for polydipsia.       Polyphagia Negative Hypoglycemia    PHYSICAL EXAM: Blood pressure 134/78, pulse (!) 55, temperature 97.7 F (36.5 C), temperature source Oral, height 5\' 11"  (1.803 m), weight 240 lb (108.9 kg), SpO2 98 %. Body mass index is 33.47 kg/m. Physical Exam  Constitutional: He is oriented to person, place, and time. He appears well-developed and well-nourished.  Cardiovascular: Normal rate.   Pulmonary/Chest: Effort normal.  Musculoskeletal: Normal range of motion.  Neurological: He is oriented to person, place, and time.  Skin: Skin is warm and dry.  Psychiatric: He has a normal mood and affect. His behavior is normal.  Vitals reviewed.   RECENT LABS AND TESTS: BMET    Component Value Date/Time   NA 141 11/27/2016 1158   K 4.0 11/27/2016 1158   CL 101 11/27/2016 1158   CO2 17 (L) 11/27/2016 1158   GLUCOSE 85 11/27/2016 1158    GLUCOSE 98 09/21/2016 0911   BUN 17 11/27/2016 1158   CREATININE 0.94 11/27/2016 1158   CREATININE 1.08 05/05/2014 1518   CALCIUM 9.7 11/27/2016 1158   GFRNONAA 95 11/27/2016 1158   GFRNONAA >60 01/04/2011 0911   GFRAA 110 11/27/2016 1158   GFRAA >60 01/04/2011 0911   Lab Results  Component Value Date   HGBA1C 6.1 (H) 11/27/2016   HGBA1C 6.2 09/21/2016   HGBA1C 6.6 (H) 05/22/2016   HGBA1C 6.4 09/30/2015   HGBA1C 6.2 06/24/2015   Lab Results  Component Value Date   INSULIN 28.3 (H) 11/27/2016   CBC    Component Value Date/Time   WBC 4.5 11/27/2016 1158   WBC 4.4 09/21/2016 0911   RBC 5.50 11/27/2016 1158   RBC 5.44 09/21/2016 0911   HGB 15.7 09/21/2016 0911   HCT 46.5 11/27/2016 1158   PLT 219.0 09/21/2016 0911   MCV 85 11/27/2016 1158   MCH 28.7 11/27/2016 1158   MCH 28.7 05/05/2014 1518   MCHC 34.0 11/27/2016 1158   MCHC 34.1 09/21/2016 0911   RDW 14.1 11/27/2016 1158   LYMPHSABS 1.8 11/27/2016 1158   MONOABS 0.4 03/23/2015 1257   EOSABS 0.1 11/27/2016 1158   BASOSABS 0.0 11/27/2016 1158   Iron/TIBC/Ferritin/ %Sat No results found for: IRON, TIBC, FERRITIN, IRONPCTSAT Lipid Panel     Component Value Date/Time   CHOL 168 11/27/2016 1158   TRIG 88 11/27/2016 1158   HDL 38 (L) 11/27/2016 1158   CHOLHDL 5 09/21/2016 0911   VLDL 16.6 09/21/2016 0911   LDLCALC 112 (H) 11/27/2016 1158   LDLDIRECT 173.0 05/22/2016 1648   Hepatic Function Panel     Component Value Date/Time   PROT 7.4 11/27/2016 1158  ALBUMIN 4.6 11/27/2016 1158   AST 27 11/27/2016 1158   ALT 37 11/27/2016 1158   ALKPHOS 76 11/27/2016 1158   BILITOT 0.4 11/27/2016 1158   BILIDIR 0.1 05/05/2014 1518   IBILI 0.3 05/05/2014 1518      Component Value Date/Time   TSH 1.610 11/27/2016 1158   TSH 1.53 09/21/2016 0911   TSH 1.61 07/21/2016 0939    ASSESSMENT AND PLAN: Vitamin D deficiency - Plan: Vitamin D, Ergocalciferol, (DRISDOL) 50000 units CAPS capsule  Type 2 diabetes mellitus  without complication, without long-term current use of insulin ([name]) - Plan: metFORMIN (GLUCOPHAGE) 500 MG tablet  At risk for diabetes mellitus  Class 1 obesity without serious comorbidity with body mass index (BMI) of 33.0 to 33.9 in adult, unspecified obesity type  PLAN:  Vitamin D Deficiency Jahmez was informed that low vitamin D levels contributes to fatigue and are associated with obesity, breast, and colon cancer. He agrees to start to take prescription Vit D @50 ,000 IU every week #4 with no refills and we will re-check labs in 3 months and will follow up for routine testing of vitamin D, at least 2-3 times per year. He was informed of the risk of over-replacement of vitamin D and agrees to not increase his dose unless he discusses this with [place] first.  Diabetes II [name] Foster has been given extensive diabetes education by myself today including ideal fasting and post-prandial blood glucose readings, individual ideal Hgb A1c goals and hypoglycemia prevention. We discussed the importance of good blood sugar control to decrease the likelihood of diabetic complications such as nephropathy, neuropathy, limb loss, blindness, coronary artery disease, and death. We discussed the importance of intensive lifestyle modification including diet, exercise and weight loss as the first line treatment for diabetes. [name] Foster agrees to start to take Metformin 500 mg every morning #30 with no refills and will follow up at the agreed upon time. We will wait to have [name] Foster check blood sugars at this stage since he is so well controlled.  Cardiovascular risk counselling [name] Foster was given extended (at least 15 minutes) coronary artery disease prevention counseling today. He is 51 y.o. male and has risk factors for heart disease including obesity. We discussed intensive lifestyle modifications today with an emphasis on specific weight loss instructions and strategies. Pt was also informed of the importance of increasing  exercise and decreasing saturated fats to help prevent heart disease.  Obesity [name] Foster is currently in the action stage of change. As such, his goal is to continue with weight loss efforts He has agreed to follow the Category 3 plan +100 calories [name] Foster has been instructed to work up to a goal of 150 minutes of combined cardio and strengthening exercise per week for weight loss and overall health benefits. We discussed the following Behavioral Modification Stratagies today: increasing water, increasing lean protein intake, decreasing simple carbohydrates , decrease eating out and dealing with family or coworker sabotage  [name] Foster has agreed to follow up with our clinic in 2 weeks. He was informed of the importance of frequent follow up visits to maximize his success with intensive lifestyle modifications for his multiple health conditions.  I, Doreene Nest, am acting as scribe for Dennard Nip, MD  I have reviewed the above documentation for accuracy and completeness, and I agree with the above. -Dennard Nip, MD

## 2016-12-18 ENCOUNTER — Ambulatory Visit (HOSPITAL_COMMUNITY)
Admission: RE | Admit: 2016-12-18 | Discharge: 2016-12-18 | Disposition: A | Discharge status: Home / Self Care | Payer: BLUE CROSS/BLUE SHIELD | Source: Ambulatory Visit | Attending: Cardiology | Admitting: Cardiology

## 2016-12-18 DIAGNOSIS — N62 Hypertrophy of breast: Secondary | ICD-10-CM | POA: Diagnosis not present

## 2016-12-18 DIAGNOSIS — R079 Chest pain, unspecified: Secondary | ICD-10-CM

## 2016-12-18 DIAGNOSIS — I10 Essential (primary) hypertension: Secondary | ICD-10-CM | POA: Insufficient documentation

## 2016-12-18 MED ORDER — NITROGLYCERIN 0.4 MG SL SUBL
0.8000 mg | SUBLINGUAL_TABLET | Freq: Once | SUBLINGUAL | Status: AC
  Administered 2016-12-18: 0.8 mg via SUBLINGUAL

## 2016-12-18 MED ORDER — METOPROLOL TARTRATE 5 MG/5ML IV SOLN
INTRAVENOUS | Status: AC
  Filled 2016-12-18: qty 10

## 2016-12-18 MED ORDER — NITROGLYCERIN 0.4 MG SL SUBL
SUBLINGUAL_TABLET | SUBLINGUAL | Status: AC
  Filled 2016-12-18: qty 2

## 2016-12-18 MED ORDER — METOPROLOL TARTRATE 5 MG/5ML IV SOLN
5.0000 mg | INTRAVENOUS | Status: DC | PRN
Start: 2016-12-18 — End: 2016-12-19
  Administered 2016-12-18 (×2): 5 mg via INTRAVENOUS

## 2016-12-18 MED ORDER — IOPAMIDOL (ISOVUE-370) INJECTION 76%
INTRAVENOUS | Status: AC
  Administered 2016-12-18: 80 mL
  Filled 2016-12-18: qty 100

## 2016-12-20 NOTE — Telephone Encounter (Signed)
Patient had CT and results released in mychart with Dr Blenda Mounts comments

## 2016-12-25 ENCOUNTER — Ambulatory Visit (INDEPENDENT_AMBULATORY_CARE_PROVIDER_SITE_OTHER): Payer: BLUE CROSS/BLUE SHIELD | Admitting: Cardiovascular Disease

## 2016-12-25 ENCOUNTER — Encounter: Payer: Self-pay | Admitting: Cardiovascular Disease

## 2016-12-25 VITALS — BP 122/74 | HR 64 | Ht 70.5 in | Wt 241.0 lb

## 2016-12-25 DIAGNOSIS — R079 Chest pain, unspecified: Secondary | ICD-10-CM | POA: Diagnosis not present

## 2016-12-25 DIAGNOSIS — E6609 Other obesity due to excess calories: Secondary | ICD-10-CM

## 2016-12-25 DIAGNOSIS — E7800 Pure hypercholesterolemia, unspecified: Secondary | ICD-10-CM | POA: Diagnosis not present

## 2016-12-25 DIAGNOSIS — I10 Essential (primary) hypertension: Secondary | ICD-10-CM

## 2016-12-25 NOTE — Patient Instructions (Signed)

## 2016-12-25 NOTE — Progress Notes (Signed)
Cardiology Office Note   Date:  12/25/2016   ID:  [name] Foster, DOB 1966/02/27, MRN [id number]  PCP:  [name] Homans, MD  Cardiologist:   [name] Latch, MD  GI: Dr. Hilarie Foster  No chief complaint on file.     History of Present Illness: [name] Foster is a 51 y.o. male with hypertension, hyperlipidemia, diabetes, GERD, OSA, family history of premature CAD, and scleroderma of the esophagus who presents for an follow up. He was initially seen 10/2016 for evaluation of atypical chest pain.  [name] Foster reported 2 months of substernal chest tightness when drinking.  He denies exertional symptoms.  [name] Foster previously had an echo 07/14/15 that revealed LVEF 55-60% and grade 1 diastolic dysfunction.  He was a patient of Dr. Aundra Foster, last seen 02/2014.  He had a cardiac CT 07/2012 that showed normal coronaries and a calcium score of 0 and normal coronary arteries  Since his last appointment Mr. [name] Foster continues to have chest pressure after he eats and after drinking certain liquids. This is especially, and after drinking coffee. He has no exertional chest pain or shortness of breath. He recently saw his gastroenterologist, Dr. Hilarie Foster.  He was started on dicyclomine and amitriptyline, which he thinks has helped.  Mr. Shadle also started a healthy weight and wellness program. It includes a meal plan. He's lost over 15 pounds in the last month.  He has also been working to increase his exercise.  Past Medical History:  Diagnosis Date  . Bone spur    left foot  . Chest pain   . Diabetes mellitus type 2 in obese ([place]) 07/13/2013  . Diverticulosis   . Erectile dysfunction 01/15/2013  . Esophageal reflux 01/15/2013  . Fatty liver 07/04/2015  . GERD (gastroesophageal reflux disease)   . Hiatal hernia   . Hiatal hernia with gastroesophageal reflux 03/20/2014  . Hyperglycemia 07/13/2013  . Hyperplastic colon polyp   . Hypertension   . Internal hemorrhoids   . Mixed hyperlipidemia   . Murmur   . OSA (obstructive  sleep apnea)    s/p UPPP  . Plantar fasciitis   . Prediabetes   . Reflux esophagitis   . Stomach ulcer    from PCP  . Urinary hesitancy 09/30/2015    Past Surgical History:  Procedure Laterality Date  . [place] STUDY N/A 02/28/2016   Procedure: [place] STUDY;  Surgeon: Jerene Bears, MD;  Location: [facility];  Service: Gastroenterology;  Laterality: N/A;  . ESOPHAGEAL MANOMETRY N/A 09/01/2013   Procedure: ESOPHAGEAL MANOMETRY (EM);  Surgeon: Sable Feil, MD;  Location: [facility];  Service: Endoscopy;  Laterality: N/A;  . ESOPHAGEAL MANOMETRY N/A 02/28/2016   Procedure: ESOPHAGEAL MANOMETRY (EM);  Surgeon: Jerene Bears, MD;  Location: [facility];  Service: Gastroenterology;  Laterality: N/A;  . TONSILLECTOMY    . UVULECTOMY       Current Outpatient Prescriptions  Medication Sig Dispense Refill  . acetaminophen (TYLENOL) 500 MG tablet Take 1 tablet (500 mg total) by mouth every 6 (six) hours as needed. 60 tablet 0  . amitriptyline (ELAVIL) 10 MG tablet Take 1 tablet (10 mg total) by mouth at bedtime. 30 tablet 5  . aspirin EC 81 MG tablet Take 81 mg by mouth daily.    [name] atorvastatin (LIPITOR) 40 MG tablet Take 1 tablet (40 mg total) by mouth daily. 90 tablet 3  . dicyclomine (BENTYL) 20 MG tablet Take 1 tablet (20 mg total) by  mouth 3 (three) times daily as needed for spasms. 90 tablet 5  . esomeprazole (NEXIUM) 40 MG capsule Take 1 capsule (40 mg total) by mouth daily. 30 capsule 3  . fluticasone (FLONASE) 50 MCG/ACT nasal spray Place 2 sprays into both nostrils daily. 16 g 6  . losartan (COZAAR) 50 MG tablet Take 1 tablet (50 mg total) by mouth daily. 90 tablet 2  . metFORMIN (GLUCOPHAGE) 500 MG tablet Take 1 tablet (500 mg total) by mouth every morning. 30 tablet 0  . Multiple Vitamin (MULTIVITAMIN) tablet Take 1 tablet by mouth daily.    . NONFORMULARY OR COMPOUNDED ITEM     . Probiotic Product (PROBIOTIC DAILY) CAPS Take 1 capsule 1 to 2 times daily between  meals or on an empty stomach. 100 capsule 1  . ranitidine (ZANTAC) 300 MG tablet Take 1 tablet (300 mg total) by mouth at bedtime. 30 tablet 5  . tadalafil (CIALIS) 5 MG tablet Take 1 tablet (5 mg total) by mouth daily as needed for erectile dysfunction. 30 tablet 5  . triamterene-hydrochlorothiazide (MAXZIDE-25) 37.5-25 MG tablet Take 1 tablet by mouth daily. 30 tablet 0  . Vitamin D, Ergocalciferol, (DRISDOL) 50000 units CAPS capsule Take 1 capsule (50,000 Units total) by mouth every 7 (seven) days. 4 capsule 0  . olopatadine (PATANOL) 0.1 % ophthalmic solution Place 1 drop into both eyes 2 (two) times daily. (Patient not taking: Reported on 12/25/2016) 5 mL 1   No current facility-administered medications for this visit.     Allergies:   Hydrocodone    Social History:  The patient  reports that he has never smoked. He has never used smokeless tobacco. He reports that he does not drink alcohol or use drugs.   Family History:  The patient's family history includes Dementia in his mother; Diabetes in his brother, mother, sister, and sister; Heart attack in his maternal grandmother; Heart failure in his mother; Hypertension in his mother; Obesity in his father and mother; Pancreatic disease in his sister; Stroke in his mother.    ROS:  Please see the history of present illness.   Otherwise, review of systems are positive for none.   All other systems are reviewed and negative.    PHYSICAL EXAM: VS:  BP 122/74   Pulse 64   Ht 5' 10.5" (1.791 m)   Wt 109.3 kg (241 lb)   BMI 34.09 kg/m  , BMI Body mass index is 34.09 kg/m. GENERAL:  Well appearing HEENT:  Pupils equal round and reactive, fundi not visualized, oral mucosa unremarkable NECK:  No jugular venous distention, waveform within normal limits, carotid upstroke brisk and symmetric, no bruits LYMPHATICS:  No cervical adenopathy LUNGS:  Clear to auscultation bilaterally HEART:  RRR.  PMI not displaced or sustained,S1 and S2 within  normal limits, no S3, no S4, no clicks, no rubs, I/VI systolic murmur at the LUSB ABD:  Flat, positive bowel sounds normal in frequency in pitch, no bruits, no rebound, no guarding, no midline pulsatile mass, no hepatomegaly, no splenomegaly EXT:  2 plus pulses throughout, no edema, no cyanosis no clubbing SKIN:  No rashes no nodules NEURO:  Cranial nerves II through XII grossly intact, motor grossly intact throughout PSYCH:  Cognitively intact, oriented to person place and time    EKG:  EKG is not ordered today. The ekg ordered today demonstrates sinus bradycardia. Rate 53 bpm.  11/13: Cardiac CT. Normal cors. Ca++ score = 0.   Echo 07/14/15: Study Conclusions  -  Left ventricle: The cavity size was normal. Wall thickness was   normal. Systolic function was normal. The estimated ejection   fraction was in the range of 55% to 60%. Wall motion was normal;   there were no regional wall motion abnormalities. Doppler   parameters are consistent with abnormal left ventricular   relaxation (grade 1 diastolic dysfunction).  Impressions:  - Normal LV systolic function; grade 1 diastolic dysfunction.  Coronary calcium score 12/18/16: IMPRESSION: 1) Calcium score 0  2) Normal right dominant coronary arteries  3) Normal aortic root 2.6 cm  Recent Labs: 09/21/2016: Hemoglobin 15.7; Platelets 219.0 11/27/2016: ALT 37; BUN 17; Creatinine, Ser 0.94; Potassium 4.0; Sodium 141; TSH 1.610    Lipid Panel    Component Value Date/Time   CHOL 168 11/27/2016 1158   TRIG 88 11/27/2016 1158   HDL 38 (L) 11/27/2016 1158   CHOLHDL 5 09/21/2016 0911   VLDL 16.6 09/21/2016 0911   LDLCALC 112 (H) 11/27/2016 1158   LDLDIRECT 173.0 05/22/2016 1648      Wt Readings from Last 3 Encounters:  12/25/16 109.3 kg (241 lb)  12/11/16 108.9 kg (240 lb)  11/27/16 114.3 kg (252 lb)      ASSESSMENT AND PLAN:  # Atypical chest pain:  Cardiac CT was negative for CAD.  This is likely related to  esophageal spasm and is currently managed by Dr. Hilarie Foster.  # Hypertension:  Blood pressure is well-controlled.  Continue losartan, HCTZ-triamterene.  # Hyperlipidemia:  LDL 173.  Atorvastatin appropriately increased.  He Was encouraged to keep up his diet and exercise changes.  # Obesity: Mr. Dimond was congratulated on his weight loss efforts. His BMI has decreased from 36 to 34 over the last month.   Current medicines are reviewed at length with the patient today.  The patient does not have concerns regarding medicines.  The following changes have been made:  no change  Labs/ tests ordered today include:   No orders of the defined types were placed in this encounter.    Disposition:   FU with Beck Cofer C. Oval Linsey, MD, [facility] in 1 year   This note was written with the assistance of speech recognition software.  Please excuse any transcriptional errors.  Signed, Marzell Isakson C. Oval Linsey, MD, [facility]  12/25/2016 10:12 AM    [place]

## 2016-12-26 ENCOUNTER — Ambulatory Visit: Payer: Self-pay | Admitting: General Surgery

## 2016-12-26 NOTE — H&P (Signed)
History of Present Illness Ralene Ok MD; 12/26/2016 10:06 AM) The patient is a 51 year old male who presents with an inguinal hernia. Patient is a 51 year old male who returns to been evaluated for a left sliding inguinal hernia approximately year ago. He states that the area has gotten slightly larger. He states it's continues to be symptomatic with urination. He does the right inguinal hernia. He does not feel this gotten any larger. There are no exacerbating or relieving factors. ---------------------------------------------------------------- 51 year old male who is referred by Dr. Louis Meckel for evaluation of a left-sided sliding inguinal hernia. Patient states that over the last 6 months has noticed a bulge left inguinal area may be longer than that. He states that he has difficulty with urination. He states his unable to fully void. He does state that patient on the hernia allows him to continue to void completely.  Patient with CT scan which revealed a left-sided sliding inguinal hernia containing bladder. Patient also has a right fat-containing inguinal hernia. He also has a fat-containing umbilical hernia.   Allergies Malachy Moan, [place]; 12/26/2016 9:56 AM) Hydrocodone-Acetaminophen *ANALGESICS - OPIOID*   Medication History Malachy Moan, RMA; 12/26/2016 10:00 AM) Amitriptyline HCl (10MG  Tablet, Oral) Active. Atorvastatin Calcium (40MG  Tablet, Oral) Active. Dicyclomine HCl (20MG  Tablet, Oral) Active. RaNITidine HCl (300MG  Tablet, Oral) Active. Vitamin D (Ergocalciferol) (50000UNIT Capsule, Oral) Active. Tylenol (500MG  Capsule, Oral) Active. Aspirin EC (81MG  Tablet DR, Oral) Active. NexIUM (40MG  Capsule DR, Oral) Active. Flonase (50MCG/ACT Suspension, Nasal) Active. Cozaar (50MG  Tablet, Oral) Active. MetFORMIN HCl (500MG  Tablet, Oral) Active. Multivitamin (Oral) Active. Probiotic Product (Oral) Active. Triamterene-HCTZ (37.5-25MG  Tablet, Oral)  Active. Cialis (10MG  Tablet, Oral) Active. Medications Reconciled    Review of Systems Ralene Ok, MD; 12/26/2016 10:6 AM) General Present- Weight Gain. Not Present- Appetite Loss, Chills, Fatigue, Fever, Night Sweats and Weight Loss. Skin Not Present- Change in Wart/Mole, Dryness, Hives, Jaundice, New Lesions, Non-Healing Wounds, Rash and Ulcer. HEENT Present- Wears glasses/contact lenses. Not Present- Earache, Hearing Loss, Hoarseness, Nose Bleed, Oral Ulcers, Ringing in the Ears, Seasonal Allergies, Sinus Pain, Sore Throat, Visual Disturbances and Yellow Eyes. Respiratory Present- Snoring. Not Present- Bloody sputum, Chronic Cough, Difficulty Breathing and Wheezing. Breast Not Present- Breast Mass, Breast Pain, Nipple Discharge and Skin Changes. Cardiovascular Not Present- Chest Pain, Difficulty Breathing Lying Down, Leg Cramps, Palpitations, Rapid Heart Rate, Shortness of Breath and Swelling of Extremities. Gastrointestinal Present- Indigestion. Not Present- Abdominal Pain, Bloating, Bloody Stool, Change in Bowel Habits, Chronic diarrhea, Constipation, Difficulty Swallowing, Excessive gas, Gets full quickly at meals, Hemorrhoids, Nausea, Rectal Pain and Vomiting. Male Genitourinary Present- Change in Urinary Stream and Painful Urination. Not Present- Blood in Urine, Frequency, Impotence, Nocturia, Urgency and Urine Leakage.  Vitals Malachy Moan RMA; 12/26/2016 10:01 AM) 12/26/2016 10:00 AM Weight: 241.2 lb Height: 68in Body Surface Area: 2.21 m Body Mass Index: 36.67 kg/m  Temp.: 98.45F  Pulse: 67 (Regular)  BP: 118/70 (Sitting, Left Arm, Standard)       Physical Exam Ralene Ok, MD; 12/26/2016 10:6 AM) General Mental Status-Alert. General Appearance-Consistent with stated age. Hydration-Well hydrated. Voice-Normal.  Head and Neck Head-normocephalic, atraumatic with no lesions or palpable masses. Trachea-midline. Thyroid Gland  Characteristics - normal size and consistency.  Chest and Lung Exam Chest and lung exam reveals -quiet, even and easy respiratory effort with no use of accessory muscles and on auscultation, normal breath sounds, no adventitious sounds and normal vocal resonance. Inspection Chest Wall - Normal. Back - normal.  Cardiovascular Cardiovascular examination reveals -normal heart sounds, regular  rate and rhythm with no murmurs and normal pedal pulses bilaterally.  Abdomen Inspection Skin - Scar - no surgical scars. Hernias - Umbilical hernia - Reducible. Inguinal hernia - Bilateral - Reducible. Palpation/Percussion Normal exam - Soft, Non Tender, No Rebound tenderness, No Rigidity (guarding) and No hepatosplenomegaly. Auscultation Normal exam - Bowel sounds normal.    Assessment & Plan Ralene Ok MD; 12/26/2016 10:06 AM) BILATERAL INGUINAL HERNIA WITHOUT OBSTRUCTION OR GANGRENE, RECURRENCE NOT SPECIFIED (K40.20) Impression: 51 year old male with bilateral inguinal hernias and umbilical hernia. Patient does have a sliding component to the left inguinal hernia containing bladder.  1. I would recommend proceeding with laparoscopic bilateral inguinal hernia repair with mesh as well as open umbilical hernia repair +/- mesh. All risks and benefits were discussed with the patient to generally include, but not limited to: infection, bleeding, damage to surrounding structures, acute and chronic nerve pain, and recurrence. Alternatives were offered and described. All questions were answered and the patient voiced understanding of the procedure and wishes to proceed at this point with hernia repair.

## 2016-12-27 ENCOUNTER — Ambulatory Visit (INDEPENDENT_AMBULATORY_CARE_PROVIDER_SITE_OTHER): Payer: BLUE CROSS/BLUE SHIELD | Admitting: Family Medicine

## 2016-12-27 VITALS — BP 108/72 | HR 57 | Temp 97.9°F | Ht 70.0 in

## 2016-12-27 DIAGNOSIS — F3289 Other specified depressive episodes: Secondary | ICD-10-CM

## 2016-12-27 DIAGNOSIS — E119 Type 2 diabetes mellitus without complications: Secondary | ICD-10-CM

## 2016-12-27 DIAGNOSIS — E669 Obesity, unspecified: Secondary | ICD-10-CM

## 2016-12-27 DIAGNOSIS — Z6833 Body mass index (BMI) 33.0-33.9, adult: Secondary | ICD-10-CM

## 2016-12-27 MED ORDER — METFORMIN HCL 500 MG PO TABS: 500.0000 mg | ORAL_TABLET | Freq: Every morning | ORAL | 0 refills | Status: DC

## 2016-12-27 MED ORDER — BUPROPION HCL ER (SR) 150 MG PO TB12: 150.0000 mg | ORAL_TABLET | Freq: Every morning | ORAL | 0 refills | Status: DC

## 2016-12-28 NOTE — Progress Notes (Signed)
Office: [phone]  /  Fax: [phone]   HPI:   Chief Complaint: OBESITY [name] Foster is here to discuss his progress with his obesity treatment plan. He is following his eating plan approximately 90 % of the time and states he is exercising 45 minutes 3 times per week. [name] Foster continues to do well with weight loss but notes feeling increased hunger, especially late afternoons and evenings. [name] Foster is currently on category 3 plan +100 calories. His weight is   today and has had a weight loss of 8 pounds over a period of 2 weeks since his last visit. He has lost 12 lbs since starting treatment with [place].  Depression with emotional eating behaviors [name] Foster is struggling with increased emotional eating, especially in the pm and is using food for comfort to the extent that it is negatively impacting his health. He often snacks when he is not hungry. [name] Foster sometimes feels he is out of control and then feels guilty that he made poor food choices. He has been working on behavior modification techniques to help reduce his emotional eating and has been somewhat successful. He shows no sign of suicidal or homicidal ideations.  Diabetes II [name] Foster has a diagnosis of diabetes type II. He is stable on metformin currently.Khairi states he is not checking blood sugars at home and denies any hypoglycemic episodes. Last A1c was at 6.1 He has been working on intensive lifestyle modifications including diet, exercise, and weight loss to help control his blood glucose levels.  Depression screen [name] Foster 2/9 11/27/2016  Decreased Interest 2  Down, Depressed, Hopeless 1  PHQ - 2 Score 3  Altered sleeping 3  Tired, decreased energy 3  Change in appetite 0  Feeling bad or failure about yourself  0  Trouble concentrating 1  Suicidal thoughts 0  PHQ-9 Score 10     Wt Readings from Last 500 Encounters:  12/25/16 241 lb (109.3 kg)  12/11/16 240 lb (108.9 kg)  11/27/16 252 lb (114.3 kg)  11/21/16 258 lb 8 oz (117.3 kg)    10/23/16 254 lb 12.8 oz (115.6 kg)  09/21/16 262 lb (118.8 kg)  07/21/16 264 lb 6.4 oz (119.9 kg)  07/12/16 265 lb 9.6 oz (120.5 kg)  05/22/16 271 lb (122.9 kg)  03/06/16 263 lb (119.3 kg)  01/31/16 266 lb (120.7 kg)  01/17/16 266 lb 9.6 oz (120.9 kg)  01/17/16 266 lb 3.2 oz (120.7 kg)  12/29/15 255 lb (115.7 kg)  09/30/15 264 lb (119.7 kg)  08/26/15 266 lb (120.7 kg)  07/06/15 262 lb 6 oz (119 kg)  06/24/15 259 lb 2 oz (117.5 kg)  05/10/15 259 lb 12.8 oz (117.8 kg)  03/22/15 256 lb 2 oz (116.2 kg)  07/23/14 250 lb (113.4 kg)  06/26/14 257 lb 9.6 oz (116.8 kg)  05/05/14 257 lb (116.6 kg)  03/20/14 258 lb (117 kg)  03/06/14 253 lb 1.3 oz (114.8 kg)  02/16/14 256 lb (116.1 kg)  02/13/14 254 lb 12 oz (115.6 kg)  10/30/13 256 lb 1.3 oz (116.2 kg)  10/14/13 254 lb (115.2 kg)  10/10/13 254 lb (115.2 kg)  08/20/13 250 lb (113.4 kg)  08/14/13 250 lb 2 oz (113.5 kg)  08/12/13 249 lb 1.9 oz (113 kg)  07/08/13 251 lb (113.9 kg)  02/04/13 245 lb 12.8 oz (111.5 kg)  01/14/13 249 lb 1.3 oz (113 kg)  11/16/12 257 lb (116.6 kg)  10/14/12 249 lb 6.4 oz (113.1 kg)  10/07/12 252 lb (114.3 kg)  08/30/12 245  lb (111.1 kg)  08/16/12 248 lb (112.5 kg)  07/30/12 248 lb (112.5 kg)  07/23/12 248 lb 12.8 oz (112.9 kg)  06/26/12 240 lb (108.9 kg)  02/28/12 242 lb (109.8 kg)  02/06/12 241 lb 1.3 oz (109.4 kg)  12/20/11 238 lb 1.9 oz (108 kg)  10/25/11 242 lb 1.3 oz (109.8 kg)  09/15/11 238 lb (108 kg)  04/29/11 240 lb (108.9 kg)  01/04/11 (!) 240 lb (108.9 kg)  01/04/11 (!) 239 lb (108.4 kg)  04/25/10 (!) 228 lb 4 oz (103.5 kg)  01/05/10 (!) 229 lb 8 oz (104.1 kg)  10/08/09 (!) 234 lb (106.1 kg)     ALLERGIES: Allergies  Allergen Reactions  . Hydrocodone Itching    MEDICATIONS: Current Outpatient Prescriptions on File Prior to Visit  Medication Sig Dispense Refill  . acetaminophen (TYLENOL) 500 MG tablet Take 1 tablet (500 mg total) by mouth every 6 (six) hours as needed. 60  tablet 0  . amitriptyline (ELAVIL) 10 MG tablet Take 1 tablet (10 mg total) by mouth at bedtime. 30 tablet 5  . aspirin EC 81 MG tablet Take 81 mg by mouth daily.    [name] atorvastatin (LIPITOR) 40 MG tablet Take 1 tablet (40 mg total) by mouth daily. 90 tablet 3  . dicyclomine (BENTYL) 20 MG tablet Take 1 tablet (20 mg total) by mouth 3 (three) times daily as needed for spasms. 90 tablet 5  . esomeprazole (NEXIUM) 40 MG capsule Take 1 capsule (40 mg total) by mouth daily. 30 capsule 3  . fluticasone (FLONASE) 50 MCG/ACT nasal spray Place 2 sprays into both nostrils daily. 16 g 6  . losartan (COZAAR) 50 MG tablet Take 1 tablet (50 mg total) by mouth daily. 90 tablet 2  . Multiple Vitamin (MULTIVITAMIN) tablet Take 1 tablet by mouth daily.    . NONFORMULARY OR COMPOUNDED ITEM     . olopatadine (PATANOL) 0.1 % ophthalmic solution Place 1 drop into both eyes 2 (two) times daily. (Patient not taking: Reported on 12/25/2016) 5 mL 1  . Probiotic Product (PROBIOTIC DAILY) CAPS Take 1 capsule 1 to 2 times daily between meals or on an empty stomach. 100 capsule 1  . ranitidine (ZANTAC) 300 MG tablet Take 1 tablet (300 mg total) by mouth at bedtime. 30 tablet 5  . tadalafil (CIALIS) 5 MG tablet Take 1 tablet (5 mg total) by mouth daily as needed for erectile dysfunction. 30 tablet 5  . triamterene-hydrochlorothiazide (MAXZIDE-25) 37.5-25 MG tablet Take 1 tablet by mouth daily. 30 tablet 0  . Vitamin D, Ergocalciferol, (DRISDOL) 50000 units CAPS capsule Take 1 capsule (50,000 Units total) by mouth every 7 (seven) days. 4 capsule 0   No current facility-administered medications on file prior to visit.     PAST MEDICAL HISTORY: Past Medical History:  Diagnosis Date  . Bone spur    left foot  . Chest pain   . Diabetes mellitus type 2 in obese ([place]) 07/13/2013  . Diverticulosis   . Erectile dysfunction 01/15/2013  . Esophageal reflux 01/15/2013  . Fatty liver 07/04/2015  . GERD (gastroesophageal reflux  disease)   . Hiatal hernia   . Hiatal hernia with gastroesophageal reflux 03/20/2014  . Hyperglycemia 07/13/2013  . Hyperplastic colon polyp   . Hypertension   . Internal hemorrhoids   . Mixed hyperlipidemia   . Murmur   . OSA (obstructive sleep apnea)    s/p UPPP  . Plantar fasciitis   . Prediabetes   . Reflux  esophagitis   . Stomach ulcer    from PCP  . Urinary hesitancy 09/30/2015    PAST SURGICAL HISTORY: Past Surgical History:  Procedure Laterality Date  . [place] STUDY N/A 02/28/2016   Procedure: [place] STUDY;  Surgeon: Jerene Bears, MD;  Location: [facility];  Service: Gastroenterology;  Laterality: N/A;  . ESOPHAGEAL MANOMETRY N/A 09/01/2013   Procedure: ESOPHAGEAL MANOMETRY (EM);  Surgeon: Sable Feil, MD;  Location: [facility];  Service: Endoscopy;  Laterality: N/A;  . ESOPHAGEAL MANOMETRY N/A 02/28/2016   Procedure: ESOPHAGEAL MANOMETRY (EM);  Surgeon: Jerene Bears, MD;  Location: [facility];  Service: Gastroenterology;  Laterality: N/A;  . TONSILLECTOMY    . UVULECTOMY      SOCIAL HISTORY: Social History  Substance Use Topics  . Smoking status: Never Smoker  . Smokeless tobacco: Never Used  . Alcohol use No    FAMILY HISTORY: Family History  Problem Relation Age of Onset  . Dementia Mother   . Diabetes Mother   . Hypertension Mother   . Heart failure Mother   . Stroke Mother   . Obesity Mother   . Hypertension      siblings  . Diabetes Sister   . Diabetes Brother   . Heart attack Maternal Grandmother   . Diabetes Sister   . Pancreatic disease Sister   . Obesity Father   . Colon cancer Neg Hx     ROS: Review of Systems  Constitutional: Positive for weight loss.  Endo/Heme/Allergies:       Negative hypoglycemia  Psychiatric/Behavioral: Positive for depression. Negative for suicidal ideas.    PHYSICAL EXAM: Blood pressure 108/72, pulse (!) 57, temperature 97.9 F (36.6 C), height 5\' 10"  (1.778 m), SpO2 98 %. There is no  height or weight on file to calculate BMI. Physical Exam  Constitutional: He is oriented to person, place, and time. He appears well-developed and well-nourished.  Cardiovascular: Normal rate.   Pulmonary/Chest: Effort normal.  Musculoskeletal: Normal range of motion.  Neurological: He is oriented to person, place, and time.  Skin: Skin is warm and dry.  Vitals reviewed.   RECENT LABS AND TESTS: BMET    Component Value Date/Time   NA 141 11/27/2016 1158   K 4.0 11/27/2016 1158   CL 101 11/27/2016 1158   CO2 17 (L) 11/27/2016 1158   GLUCOSE 85 11/27/2016 1158   GLUCOSE 98 09/21/2016 0911   BUN 17 11/27/2016 1158   CREATININE 0.94 11/27/2016 1158   CREATININE 1.08 05/05/2014 1518   CALCIUM 9.7 11/27/2016 1158   GFRNONAA 95 11/27/2016 1158   GFRNONAA >60 01/04/2011 0911   GFRAA 110 11/27/2016 1158   GFRAA >60 01/04/2011 0911   Lab Results  Component Value Date   HGBA1C 6.1 (H) 11/27/2016   HGBA1C 6.2 09/21/2016   HGBA1C 6.6 (H) 05/22/2016   HGBA1C 6.4 09/30/2015   HGBA1C 6.2 06/24/2015   Lab Results  Component Value Date   INSULIN 28.3 (H) 11/27/2016   CBC    Component Value Date/Time   WBC 4.5 11/27/2016 1158   WBC 4.4 09/21/2016 0911   RBC 5.50 11/27/2016 1158   RBC 5.44 09/21/2016 0911   HGB 15.7 09/21/2016 0911   HCT 46.5 11/27/2016 1158   PLT 219.0 09/21/2016 0911   MCV 85 11/27/2016 1158   MCH 28.7 11/27/2016 1158   MCH 28.7 05/05/2014 1518   MCHC 34.0 11/27/2016 1158   MCHC 34.1 09/21/2016 0911   RDW 14.1 11/27/2016  1158   LYMPHSABS 1.8 11/27/2016 1158   MONOABS 0.4 03/23/2015 1257   EOSABS 0.1 11/27/2016 1158   BASOSABS 0.0 11/27/2016 1158   Iron/TIBC/Ferritin/ %Sat No results found for: IRON, TIBC, FERRITIN, IRONPCTSAT Lipid Panel     Component Value Date/Time   CHOL 168 11/27/2016 1158   TRIG 88 11/27/2016 1158   HDL 38 (L) 11/27/2016 1158   CHOLHDL 5 09/21/2016 0911   VLDL 16.6 09/21/2016 0911   LDLCALC 112 (H) 11/27/2016 1158    LDLDIRECT 173.0 05/22/2016 1648   Hepatic Function Panel     Component Value Date/Time   PROT 7.4 11/27/2016 1158   ALBUMIN 4.6 11/27/2016 1158   AST 27 11/27/2016 1158   ALT 37 11/27/2016 1158   ALKPHOS 76 11/27/2016 1158   BILITOT 0.4 11/27/2016 1158   BILIDIR 0.1 05/05/2014 1518   IBILI 0.3 05/05/2014 1518      Component Value Date/Time   TSH 1.610 11/27/2016 1158   TSH 1.53 09/21/2016 0911   TSH 1.61 07/21/2016 0939    ASSESSMENT AND PLAN: Other depression - Plan: buPROPion (WELLBUTRIN SR) 150 MG 12 hr tablet  Type 2 diabetes mellitus without complication, without long-term current use of insulin ([place]) - Plan: metFORMIN (GLUCOPHAGE) 500 MG tablet  Class 1 obesity without serious comorbidity with body mass index (BMI) of 33.0 to 33.9 in adult, unspecified obesity type  PLAN:  Depression with Emotional Eating Behaviors We discussed behavior modification techniques today to help Verlin deal with his emotional eating and depression. He has agreed to start to take Wellbutrin SR 150 mg qd #30 with no refills and agreed to follow up as directed.  Diabetes II Dontae has been given extensive diabetes education by myself today including ideal fasting and post-prandial blood glucose readings, individual ideal Hgb A1c goals  and hypoglycemia prevention. We discussed the importance of good blood sugar control to decrease the likelihood of diabetic complications such as nephropathy, neuropathy, limb loss, blindness, coronary artery disease, and death. We discussed the importance of intensive lifestyle modification including diet, exercise and weight loss as the first line treatment for diabetes. Benjaman agrees to continue metformin 500 mg qd #30 with no refills and will follow up at the agreed upon time.  Obesity Keghan is currently in the action stage of change. As such, his goal is to continue with weight loss efforts He has agreed to follow the Category 3 plan +200 calories Carsin has  been instructed to work up to a goal of 150 minutes of combined cardio and strengthening exercise per week for weight loss and overall health benefits. We discussed the following Behavioral Modification Stratagies today: increasing lean protein intake, decreasing simple carbohydrates , increasing lower sugar fruits and work on meal planning and easy cooking plans  Josemaria has agreed to follow up with our clinic in 2 weeks. He was informed of the importance of frequent follow up visits to maximize his success with intensive lifestyle modifications for his multiple health conditions.  I, Doreene Nest, am acting as scribe for Dennard Nip, MD  I have reviewed the above documentation for accuracy and completeness, and I agree with the above. -Dennard Nip, MD

## 2017-01-15 ENCOUNTER — Ambulatory Visit (INDEPENDENT_AMBULATORY_CARE_PROVIDER_SITE_OTHER): Payer: BLUE CROSS/BLUE SHIELD | Admitting: Family Medicine

## 2017-01-24 ENCOUNTER — Ambulatory Visit (INDEPENDENT_AMBULATORY_CARE_PROVIDER_SITE_OTHER): Payer: BLUE CROSS/BLUE SHIELD | Admitting: Family Medicine

## 2017-01-24 VITALS — BP 119/72 | HR 90 | Temp 98.8°F | Ht 70.0 in | Wt 222.0 lb

## 2017-01-24 DIAGNOSIS — F3289 Other specified depressive episodes: Secondary | ICD-10-CM | POA: Diagnosis not present

## 2017-01-24 DIAGNOSIS — E669 Obesity, unspecified: Secondary | ICD-10-CM

## 2017-01-24 DIAGNOSIS — F32A Depression, unspecified: Secondary | ICD-10-CM | POA: Insufficient documentation

## 2017-01-24 DIAGNOSIS — E119 Type 2 diabetes mellitus without complications: Secondary | ICD-10-CM | POA: Insufficient documentation

## 2017-01-24 DIAGNOSIS — Z6831 Body mass index (BMI) 31.0-31.9, adult: Secondary | ICD-10-CM

## 2017-01-24 DIAGNOSIS — E559 Vitamin D deficiency, unspecified: Secondary | ICD-10-CM

## 2017-01-24 DIAGNOSIS — F329 Major depressive disorder, single episode, unspecified: Secondary | ICD-10-CM | POA: Insufficient documentation

## 2017-01-24 MED ORDER — VITAMIN D (ERGOCALCIFEROL) 1.25 MG (50000 UNIT) PO CAPS: 50000.0000 [IU] | ORAL_CAPSULE | ORAL | 0 refills | Status: DC

## 2017-01-24 MED ORDER — METFORMIN HCL 500 MG PO TABS: 500.0000 mg | ORAL_TABLET | Freq: Every morning | ORAL | 0 refills | Status: DC

## 2017-01-24 MED ORDER — BUPROPION HCL ER (SR) 150 MG PO TB12: 150.0000 mg | ORAL_TABLET | Freq: Every morning | ORAL | 0 refills | Status: DC

## 2017-01-24 NOTE — Progress Notes (Signed)
Office: [phone]  /  Fax: [phone]   HPI:   Chief Complaint: OBESITY Kendre is here to discuss his progress with his obesity treatment plan. He is on the  follow the Category 3 plan + 100 calories and is following his eating plan approximately 98 % of the time. He states he is walking 2 times per week. Hodge continues to do well with weight loss. He has started back to the gym 2 times per week. Hunger is controlled. Hanan would like more options for dinner. His weight is 222 lb (100.7 kg) today and has had a weight loss of 10 pounds over a period of 4 weeks since his last visit. He has lost 30 lbs since starting treatment with [place].  Vitamin D deficiency Markes has a diagnosis of vitamin D deficiency. He is currently stable on vit D and denies nausea, vomiting or muscle weakness.  Diabetes II Levent has a diagnosis of diabetes type II. Hazael has questions about glucose goals. He is stable on Metformin and denies any hypoglycemic episodes, nausea or vomiting. He has been working on intensive lifestyle modifications including diet, exercise, and weight loss to help control his blood glucose levels.  Depression with emotional eating behaviors Fabio struggles with emotional eating and using food for comfort to the extent that it is negatively impacting his health. He often snacks when he is not hungry. Karel sometimes feels he is out of control and then feels guilty that he made poor food choices. His mood is stable, no insomnia and blood pressure is stable. He is doing very well decreasing emotional eating. He has been working on behavior modification techniques to help reduce his emotional eating and has been somewhat successful. He shows no sign of suicidal or homicidal ideations.  Depression screen PHQ 2/9 11/27/2016  Decreased Interest 2  Down, Depressed, Hopeless 1  PHQ - 2 Score 3  Altered sleeping 3  Tired, decreased energy 3  Change in appetite 0  Feeling bad or failure  about yourself  0  Trouble concentrating 1  Suicidal thoughts 0  PHQ-9 Score 10      ALLERGIES: Allergies  Allergen Reactions   Hydrocodone Itching    MEDICATIONS: Current Outpatient Prescriptions on File Prior to Visit  Medication Sig Dispense Refill   acetaminophen (TYLENOL) 500 MG tablet Take 1 tablet (500 mg total) by mouth every 6 (six) hours as needed. 60 tablet 0   amitriptyline (ELAVIL) 10 MG tablet Take 1 tablet (10 mg total) by mouth at bedtime. 30 tablet 5   aspirin EC 81 MG tablet Take 81 mg by mouth daily.     atorvastatin (LIPITOR) 40 MG tablet Take 1 tablet (40 mg total) by mouth daily. 90 tablet 3   buPROPion (WELLBUTRIN SR) 150 MG 12 hr tablet Take 1 tablet (150 mg total) by mouth every morning. 30 tablet 0   dicyclomine (BENTYL) 20 MG tablet Take 1 tablet (20 mg total) by mouth 3 (three) times daily as needed for spasms. 90 tablet 5   esomeprazole (NEXIUM) 40 MG capsule Take 1 capsule (40 mg total) by mouth daily. 30 capsule 3   fluticasone (FLONASE) 50 MCG/ACT nasal spray Place 2 sprays into both nostrils daily. 16 g 6   losartan (COZAAR) 50 MG tablet Take 1 tablet (50 mg total) by mouth daily. 90 tablet 2   metFORMIN (GLUCOPHAGE) 500 MG tablet Take 1 tablet (500 mg total) by mouth every morning. 30 tablet 0   Multiple Vitamin (MULTIVITAMIN)  tablet Take 1 tablet by mouth daily.     NONFORMULARY OR COMPOUNDED ITEM      olopatadine (PATANOL) 0.1 % ophthalmic solution Place 1 drop into both eyes 2 (two) times daily. 5 mL 1   Probiotic Product (PROBIOTIC DAILY) CAPS Take 1 capsule 1 to 2 times daily between meals or on an empty stomach. 100 capsule 1   ranitidine (ZANTAC) 300 MG tablet Take 1 tablet (300 mg total) by mouth at bedtime. 30 tablet 5   tadalafil (CIALIS) 5 MG tablet Take 1 tablet (5 mg total) by mouth daily as needed for erectile dysfunction. 30 tablet 5   triamterene-hydrochlorothiazide (MAXZIDE-25) 37.5-25 MG tablet Take 1 tablet by  mouth daily. 30 tablet 0   Vitamin D, Ergocalciferol, (DRISDOL) 50000 units CAPS capsule Take 1 capsule (50,000 Units total) by mouth every 7 (seven) days. 4 capsule 0   No current facility-administered medications on file prior to visit.     PAST MEDICAL HISTORY: Past Medical History:  Diagnosis Date   Bone spur    left foot   Chest pain    Diabetes mellitus type 2 in obese ([place]) 07/13/2013   Diverticulosis    Erectile dysfunction 01/15/2013   Esophageal reflux 01/15/2013   Fatty liver 07/04/2015   GERD (gastroesophageal reflux disease)    Hiatal hernia    Hiatal hernia with gastroesophageal reflux 03/20/2014   Hyperglycemia 07/13/2013   Hyperplastic colon polyp    Hypertension    Internal hemorrhoids    Mixed hyperlipidemia    Murmur    OSA (obstructive sleep apnea)    s/p UPPP   Plantar fasciitis    Prediabetes    Reflux esophagitis    Stomach ulcer    from PCP   Urinary hesitancy 09/30/2015    PAST SURGICAL HISTORY: Past Surgical History:  Procedure Laterality Date   64 HOUR [place] STUDY N/A 02/28/2016   Procedure: 24 HOUR [place] STUDY;  Surgeon: Jerene Bears, MD;  Location: [facility];  Service: Gastroenterology;  Laterality: N/A;   ESOPHAGEAL MANOMETRY N/A 09/01/2013   Procedure: ESOPHAGEAL MANOMETRY (EM);  Surgeon: Sable Feil, MD;  Location: [facility];  Service: Endoscopy;  Laterality: N/A;   ESOPHAGEAL MANOMETRY N/A 02/28/2016   Procedure: ESOPHAGEAL MANOMETRY (EM);  Surgeon: Jerene Bears, MD;  Location: [facility];  Service: Gastroenterology;  Laterality: N/A;   TONSILLECTOMY     UVULECTOMY      SOCIAL HISTORY: Social History  Substance Use Topics   Smoking status: Never Smoker   Smokeless tobacco: Never Used   Alcohol use No    FAMILY HISTORY: Family History  Problem Relation Age of Onset   Dementia Mother    Diabetes Mother    Hypertension Mother    Heart failure Mother    Stroke Mother    Obesity Mother     Hypertension Unknown        siblings   Diabetes Sister    Diabetes Brother    Heart attack Maternal Grandmother    Diabetes Sister    Pancreatic disease Sister    Obesity Father    Colon cancer Neg Hx     ROS: Review of Systems  Constitutional: Positive for weight loss.  Gastrointestinal: Negative for nausea and vomiting.  Musculoskeletal:       Negative muscle weakness  Endo/Heme/Allergies:       Negative hypoglycemia  Psychiatric/Behavioral: Positive for depression. Negative for suicidal ideas. The patient does not have insomnia.     PHYSICAL  EXAM: Blood pressure 119/72, pulse 90, temperature 98.8 F (37.1 C), temperature source Oral, height 5\' 10"  (1.778 m), weight 222 lb (100.7 kg), SpO2 98 %. Body mass index is 31.85 kg/m. Physical Exam  Constitutional: He is oriented to person, place, and time. He appears well-developed and well-nourished.  Cardiovascular: Normal rate.   Pulmonary/Chest: Effort normal.  Musculoskeletal: Normal range of motion.  Neurological: He is oriented to person, place, and time.  Skin: Skin is warm and dry.  Psychiatric: He has a normal mood and affect. His behavior is normal.  Vitals reviewed.   RECENT LABS AND TESTS: BMET    Component Value Date/Time   NA 141 11/27/2016 1158   K 4.0 11/27/2016 1158   CL 101 11/27/2016 1158   CO2 17 (L) 11/27/2016 1158   GLUCOSE 85 11/27/2016 1158   GLUCOSE 98 09/21/2016 0911   BUN 17 11/27/2016 1158   CREATININE 0.94 11/27/2016 1158   CREATININE 1.08 05/05/2014 1518   CALCIUM 9.7 11/27/2016 1158   GFRNONAA 95 11/27/2016 1158   GFRNONAA >60 01/04/2011 0911   GFRAA 110 11/27/2016 1158   GFRAA >60 01/04/2011 0911   Lab Results  Component Value Date   HGBA1C 6.1 (H) 11/27/2016   HGBA1C 6.2 09/21/2016   HGBA1C 6.6 (H) 05/22/2016   HGBA1C 6.4 09/30/2015   HGBA1C 6.2 06/24/2015   Lab Results  Component Value Date   INSULIN 28.3 (H) 11/27/2016   CBC    Component Value Date/Time   WBC  4.5 11/27/2016 1158   WBC 4.4 09/21/2016 0911   RBC 5.50 11/27/2016 1158   RBC 5.44 09/21/2016 0911   HGB 15.7 09/21/2016 0911   HCT 46.5 11/27/2016 1158   PLT 219.0 09/21/2016 0911   MCV 85 11/27/2016 1158   MCH 28.7 11/27/2016 1158   MCH 28.7 05/05/2014 1518   MCHC 34.0 11/27/2016 1158   MCHC 34.1 09/21/2016 0911   RDW 14.1 11/27/2016 1158   LYMPHSABS 1.8 11/27/2016 1158   MONOABS 0.4 03/23/2015 1257   EOSABS 0.1 11/27/2016 1158   BASOSABS 0.0 11/27/2016 1158   Iron/TIBC/Ferritin/ %Sat No results found for: IRON, TIBC, FERRITIN, IRONPCTSAT Lipid Panel     Component Value Date/Time   CHOL 168 11/27/2016 1158   TRIG 88 11/27/2016 1158   HDL 38 (L) 11/27/2016 1158   CHOLHDL 5 09/21/2016 0911   VLDL 16.6 09/21/2016 0911   LDLCALC 112 (H) 11/27/2016 1158   LDLDIRECT 173.0 05/22/2016 1648   Hepatic Function Panel     Component Value Date/Time   PROT 7.4 11/27/2016 1158   ALBUMIN 4.6 11/27/2016 1158   AST 27 11/27/2016 1158   ALT 37 11/27/2016 1158   ALKPHOS 76 11/27/2016 1158   BILITOT 0.4 11/27/2016 1158   BILIDIR 0.1 05/05/2014 1518   IBILI 0.3 05/05/2014 1518      Component Value Date/Time   TSH 1.610 11/27/2016 1158   TSH 1.53 09/21/2016 0911   TSH 1.61 07/21/2016 0939    ASSESSMENT AND PLAN: Vitamin D deficiency - Plan: Vitamin D, Ergocalciferol, (DRISDOL) 50000 units CAPS capsule  Type 2 diabetes mellitus without complication, without long-term current use of insulin ([name]) - Plan: metFORMIN (GLUCOPHAGE) 500 MG tablet  Other depression - Plan: buPROPion (WELLBUTRIN SR) 150 MG 12 hr tablet  Class 1 obesity without serious comorbidity with body mass index (BMI) of 31.0 to 31.9 in adult, unspecified obesity type  PLAN:  Vitamin D Deficiency Adel was informed that low vitamin D levels contributes to fatigue and  are associated with obesity, breast, and colon cancer. He agrees to continue to take prescription Vit D @50 ,000 IU every week, we will refill for 1  month and will follow up for routine testing of vitamin D, at least 2-3 times per year. He was informed of the risk of over-replacement of vitamin D and agrees to not increase his dose unless he discusses this with [place] first. Karston agrees to follow up with our clinic in 2 weeks.  Diabetes II Damareon has been given extensive diabetes education by myself today including ideal fasting and post-prandial blood glucose readings, individual ideal Hgb A1c goals  and hypoglycemia prevention. We discussed the importance of good blood sugar control to decrease the likelihood of diabetic complications such as nephropathy, neuropathy, limb loss, blindness, coronary artery disease, and death. We discussed the importance of intensive lifestyle modification including diet, exercise and weight loss as the first line treatment for diabetes. Cornelius agrees to continue to take metformin, we will refill for 1 month and will follow up at the agreed upon time.  Depression with Emotional Eating Behaviors We discussed behavior modification techniques today to help Patton deal with his emotional eating and depression. He has agreed to continue to take Wellbutrin SR 150 mg qd, we will refill for 1 month and will follow up at the agreed upon time.  Obesity Latravion is currently in the action stage of change. As such, his goal is to continue with weight loss efforts He has agreed to keep a food journal with 450 to 650 calories and 40 grams of protein  and follow the Category 3 plan Navdeep has been instructed to work up to a goal of 150 minutes of combined cardio and strengthening exercise per week for weight loss and overall health benefits. We discussed the following Behavioral Modification Strategies today: increasing lean protein intake and decreasing simple carbohydrates   Casper has agreed to follow up with our clinic in 2 to 3 weeks. He was informed of the importance of frequent follow up visits to maximize his success with  intensive lifestyle modifications for his multiple health conditions.  I, Doreene Nest, am acting as scribe for Dennard Nip, MD  I have reviewed the above documentation for accuracy and completeness, and I agree with the above. -Dennard Nip, MD

## 2017-01-29 ENCOUNTER — Telehealth: Payer: Self-pay | Admitting: Family Medicine

## 2017-01-29 ENCOUNTER — Other Ambulatory Visit: Payer: Self-pay | Admitting: Family Medicine

## 2017-01-29 DIAGNOSIS — R3911 Hesitancy of micturition: Secondary | ICD-10-CM

## 2017-01-29 NOTE — Telephone Encounter (Signed)
I have placed the referral but they will have to call [facility] to request a copy of the notes get transferred. I do not have them. They did not forward them to [place].

## 2017-01-29 NOTE — Telephone Encounter (Addendum)
Caller name:Lloyd-Casalino,Sharonda Relation to [id number]  Call back [phone]   Reason for call:  [facility] referred patient to [facility] due to hernia repair, spouse would like referral sent to Dr. Llana [facility]  [phone] fax [phone]  Spouse states [facility] billing estimation varies.   Spouse would like office notes from [facility] faxed to Dr. Pollyann Glen office, and would like a follow up call when referral/notes are sent, please advise

## 2017-01-30 NOTE — Telephone Encounter (Signed)
Patient informed. 

## 2017-02-06 ENCOUNTER — Other Ambulatory Visit: Payer: Self-pay | Admitting: Family Medicine

## 2017-02-12 ENCOUNTER — Ambulatory Visit (INDEPENDENT_AMBULATORY_CARE_PROVIDER_SITE_OTHER): Payer: BLUE CROSS/BLUE SHIELD | Admitting: Family Medicine

## 2017-02-12 VITALS — BP 106/65 | HR 52 | Temp 98.1°F | Ht 70.0 in | Wt 220.0 lb

## 2017-02-12 DIAGNOSIS — Z6831 Body mass index (BMI) 31.0-31.9, adult: Secondary | ICD-10-CM

## 2017-02-12 DIAGNOSIS — E669 Obesity, unspecified: Secondary | ICD-10-CM | POA: Diagnosis not present

## 2017-02-12 DIAGNOSIS — E559 Vitamin D deficiency, unspecified: Secondary | ICD-10-CM | POA: Diagnosis not present

## 2017-02-12 DIAGNOSIS — F3289 Other specified depressive episodes: Secondary | ICD-10-CM

## 2017-02-12 MED ORDER — BUPROPION HCL ER (SR) 150 MG PO TB12: 150.0000 mg | ORAL_TABLET | Freq: Every morning | ORAL | 0 refills | Status: DC

## 2017-02-12 NOTE — Progress Notes (Signed)
Office: [phone]  /  Fax: [phone]   HPI:   Chief Complaint: OBESITY [name] Foster is here to discuss his progress with his obesity treatment plan. He is on the  follow the Category 3 plan and is following his eating plan approximately 90 % of the time. He states he is walking 60 minutes 3 times per week. [name] Foster continues to do well with weight loss. He is keeping up with protein intake and staying within calorie range for dinner. His weight is 220 lb (99.8 kg) today and has had a weight loss of 2 pounds over a period of 2 to 3 weeks since his last visit. He has lost 32 lbs since starting treatment with [place].  Vitamin D deficiency [name] Foster has a diagnosis of vitamin D deficiency. He is currently taking vit D and denies nausea, vomiting or muscle weakness.  Depression with emotional eating behaviors [name] Foster is struggling with emotional eating and using food for comfort to the extent that it is negatively impacting his health. He often snacks when he is not hungry. [name] Foster sometimes feels he is out of control and then feels guilty that he made poor food choices. He has been working on behavior modification techniques to help reduce his emotional eating and has been somewhat successful. He has no insomnia, blood pressure is stable and he shows no sign of suicidal or homicidal ideations.  Depression screen PHQ 2/9 11/27/2016  Decreased Interest 2  Down, Depressed, Hopeless 1  PHQ - 2 Score 3  Altered sleeping 3  Tired, decreased energy 3  Change in appetite 0  Feeling bad or failure about yourself  0  Trouble concentrating 1  Suicidal thoughts 0  PHQ-9 Score 10      ALLERGIES: Allergies  Allergen Reactions  . Hydrocodone Itching    MEDICATIONS: Current Outpatient Prescriptions on File Prior to Visit  Medication Sig Dispense Refill  . acetaminophen (TYLENOL) 500 MG tablet Take 1 tablet (500 mg total) by mouth every 6 (six) hours as needed. 60 tablet 0  . amitriptyline (ELAVIL) 10 MG  tablet Take 1 tablet (10 mg total) by mouth at bedtime. 30 tablet 5  . aspirin EC 81 MG tablet Take 81 mg by mouth daily.    [name] atorvastatin (LIPITOR) 40 MG tablet Take 1 tablet (40 mg total) by mouth daily. 90 tablet 3  . dicyclomine (BENTYL) 20 MG tablet Take 1 tablet (20 mg total) by mouth 3 (three) times daily as needed for spasms. 90 tablet 5  . esomeprazole (NEXIUM) 40 MG capsule Take 1 capsule (40 mg total) by mouth daily. 30 capsule 3  . fluticasone (FLONASE) 50 MCG/ACT nasal spray Place 2 sprays into both nostrils daily. 16 g 6  . losartan (COZAAR) 50 MG tablet Take 1 tablet (50 mg total) by mouth daily. 90 tablet 2  . metFORMIN (GLUCOPHAGE) 500 MG tablet Take 1 tablet (500 mg total) by mouth every morning. 30 tablet 0  . Multiple Vitamin (MULTIVITAMIN) tablet Take 1 tablet by mouth daily.    . NONFORMULARY OR COMPOUNDED ITEM     . olopatadine (PATANOL) 0.1 % ophthalmic solution Place 1 drop into both eyes 2 (two) times daily. 5 mL 1  . Probiotic Product (PROBIOTIC DAILY) CAPS Take 1 capsule 1 to 2 times daily between meals or on an empty stomach. 100 capsule 1  . ranitidine (ZANTAC) 300 MG tablet Take 1 tablet (300 mg total) by mouth at bedtime. 30 tablet 5  . tadalafil (CIALIS) 5 MG  tablet Take 1 tablet (5 mg total) by mouth daily as needed for erectile dysfunction. 30 tablet 5  . triamterene-hydrochlorothiazide (MAXZIDE-25) 37.5-25 MG tablet TAKE ONE TABLET BY MOUTH ONCE DAILY 30 tablet 0  . Vitamin D, Ergocalciferol, (DRISDOL) 50000 units CAPS capsule Take 1 capsule (50,000 Units total) by mouth every 7 (seven) days. 4 capsule 0   No current facility-administered medications on file prior to visit.     PAST MEDICAL HISTORY: Past Medical History:  Diagnosis Date  . Bone spur    left foot  . Chest pain   . Diabetes mellitus type 2 in obese ([place]) 07/13/2013  . Diverticulosis   . Erectile dysfunction 01/15/2013  . Esophageal reflux 01/15/2013  . Fatty liver 07/04/2015  . GERD  (gastroesophageal reflux disease)   . Hiatal hernia   . Hiatal hernia with gastroesophageal reflux 03/20/2014  . Hyperglycemia 07/13/2013  . Hyperplastic colon polyp   . Hypertension   . Internal hemorrhoids   . Mixed hyperlipidemia   . Murmur   . OSA (obstructive sleep apnea)    s/p UPPP  . Plantar fasciitis   . Prediabetes   . Reflux esophagitis   . Stomach ulcer    from PCP  . Urinary hesitancy 09/30/2015    PAST SURGICAL HISTORY: Past Surgical History:  Procedure Laterality Date  . [place] STUDY N/A 02/28/2016   Procedure: [place] STUDY;  Surgeon: [name] Foster Bears, MD;  Location: [facility];  Service: Gastroenterology;  Laterality: N/A;  . ESOPHAGEAL MANOMETRY N/A 09/01/2013   Procedure: ESOPHAGEAL MANOMETRY (EM);  Surgeon: Sable Feil, MD;  Location: [facility];  Service: Endoscopy;  Laterality: N/A;  . ESOPHAGEAL MANOMETRY N/A 02/28/2016   Procedure: ESOPHAGEAL MANOMETRY (EM);  Surgeon: [name] Foster Bears, MD;  Location: [facility];  Service: Gastroenterology;  Laterality: N/A;  . TONSILLECTOMY    . UVULECTOMY      SOCIAL HISTORY: Social History  Substance Use Topics  . Smoking status: Never Smoker  . Smokeless tobacco: Never Used  . Alcohol use No    FAMILY HISTORY: Family History  Problem Relation Age of Onset  . Dementia Mother   . Diabetes Mother   . Hypertension Mother   . Heart failure Mother   . Stroke Mother   . Obesity Mother   . Hypertension Unknown        siblings  . Diabetes Sister   . Diabetes Brother   . Heart attack Maternal Grandmother   . Diabetes Sister   . Pancreatic disease Sister   . Obesity Father   . Colon cancer Neg Hx     ROS: Review of Systems  Constitutional: Positive for weight loss.  Gastrointestinal: Negative for nausea and vomiting.  Musculoskeletal:       Negative muscle weakness  Psychiatric/Behavioral: Positive for depression. Negative for hallucinations and suicidal ideas.    PHYSICAL EXAM: Blood  pressure 106/65, pulse (!) 52, temperature 98.1 F (36.7 C), temperature source Oral, height 5\' 10"  (1.778 m), weight 220 lb (99.8 kg), SpO2 99 %. Body mass index is 31.57 kg/m. Physical Exam  Constitutional: He is oriented to person, place, and time. He appears well-developed and well-nourished.  Cardiovascular: Normal rate.   Pulmonary/Chest: Effort normal.  Musculoskeletal: Normal range of motion.  Neurological: He is oriented to person, place, and time.  Skin: Skin is warm and dry.  Psychiatric: He has a normal mood and affect. His behavior is normal.  Vitals reviewed.   RECENT LABS AND  TESTS: BMET    Component Value Date/Time   NA 141 11/27/2016 1158   K 4.0 11/27/2016 1158   CL 101 11/27/2016 1158   CO2 17 (L) 11/27/2016 1158   GLUCOSE 85 11/27/2016 1158   GLUCOSE 98 09/21/2016 0911   BUN 17 11/27/2016 1158   CREATININE 0.94 11/27/2016 1158   CREATININE 1.08 05/05/2014 1518   CALCIUM 9.7 11/27/2016 1158   GFRNONAA 95 11/27/2016 1158   GFRNONAA >60 01/04/2011 0911   GFRAA 110 11/27/2016 1158   GFRAA >60 01/04/2011 0911   Lab Results  Component Value Date   HGBA1C 6.1 (H) 11/27/2016   HGBA1C 6.2 09/21/2016   HGBA1C 6.6 (H) 05/22/2016   HGBA1C 6.4 09/30/2015   HGBA1C 6.2 06/24/2015   Lab Results  Component Value Date   INSULIN 28.3 (H) 11/27/2016   CBC    Component Value Date/Time   WBC 4.5 11/27/2016 1158   WBC 4.4 09/21/2016 0911   RBC 5.50 11/27/2016 1158   RBC 5.44 09/21/2016 0911   HGB 15.7 09/21/2016 0911   HCT 46.5 11/27/2016 1158   PLT 219.0 09/21/2016 0911   MCV 85 11/27/2016 1158   MCH 28.7 11/27/2016 1158   MCH 28.7 05/05/2014 1518   MCHC 34.0 11/27/2016 1158   MCHC 34.1 09/21/2016 0911   RDW 14.1 11/27/2016 1158   LYMPHSABS 1.8 11/27/2016 1158   MONOABS 0.4 03/23/2015 1257   EOSABS 0.1 11/27/2016 1158   BASOSABS 0.0 11/27/2016 1158   Iron/TIBC/Ferritin/ %Sat No results found for: IRON, TIBC, FERRITIN, IRONPCTSAT Lipid Panel       Component Value Date/Time   CHOL 168 11/27/2016 1158   TRIG 88 11/27/2016 1158   HDL 38 (L) 11/27/2016 1158   CHOLHDL 5 09/21/2016 0911   VLDL 16.6 09/21/2016 0911   LDLCALC 112 (H) 11/27/2016 1158   LDLDIRECT 173.0 05/22/2016 1648   Hepatic Function Panel     Component Value Date/Time   PROT 7.4 11/27/2016 1158   ALBUMIN 4.6 11/27/2016 1158   AST 27 11/27/2016 1158   ALT 37 11/27/2016 1158   ALKPHOS 76 11/27/2016 1158   BILITOT 0.4 11/27/2016 1158   BILIDIR 0.1 05/05/2014 1518   IBILI 0.3 05/05/2014 1518      Component Value Date/Time   TSH 1.610 11/27/2016 1158   TSH 1.53 09/21/2016 0911   TSH 1.61 07/21/2016 0939    ASSESSMENT AND PLAN: Vitamin D deficiency  Other depression - Plan: buPROPion (WELLBUTRIN SR) 150 MG 12 hr tablet  Class 1 obesity without serious comorbidity with body mass index (BMI) of 31.0 to 31.9 in adult, unspecified obesity type  PLAN:  Vitamin D Deficiency [name] Foster was informed that low vitamin D levels contributes to fatigue and are associated with obesity, breast, and colon cancer. He agrees to continue to take prescription Vit D @50 ,000 IU every week and will follow up for routine testing of vitamin D, at least 2-3 times per year. He was informed of the risk of over-replacement of vitamin D and agrees to not increase his dose unless he discusses this with [place] first.  Depression with Emotional Eating Behaviors We discussed behavior modification techniques today to help [name] Foster deal with his emotional eating and depression. He has agreed to continue to take Wellbutrin SR 150 mg qd, we will refill for 1 month and he agreed to follow up as directed.  Obesity [name] Foster is currently in the action stage of change. As such, his goal is to continue with weight loss efforts He  has agreed to keep a food journal with 450 to 600 calories and 40 grams of protein at supper and follow the Category 3 plan [name] Foster has been instructed to work up to a goal of 150  minutes of combined cardio and strengthening exercise per week for weight loss and overall health benefits. We discussed the following Behavioral Modification Strategies today: increasing lean protein intake and increasing H2O.  [name] Foster has agreed to follow up with our clinic in 3 weeks. He was informed of the importance of frequent follow up visits to maximize his success with intensive lifestyle modifications for his multiple health conditions.  I, Doreene Nest, am acting as scribe for Dennard Nip, MD  I have reviewed the above documentation for accuracy and completeness, and I agree with the above. -Dennard Nip, MD  OBESITY BEHAVIORAL INTERVENTION VISIT  Today's visit was # 5 out of 22.  Starting weight: 252 lbs Starting date: 11/27/16 Today's weight : 220 lbs  Today's date: 02/12/2017 Total lbs lost to date: 50 (Patients must lose 7 lbs in the first 6 months to continue with counseling)   ASK: We discussed the diagnosis of obesity with [name] Foster today and [name] Foster agreed to give [place] permission to discuss obesity behavioral modification therapy today.  ASSESS: [name] Foster has the diagnosis of obesity and his BMI today is 31.6 [name] Foster is in the action stage of change   ADVISE: [name] Foster was educated on the multiple health risks of obesity as well as the benefit of weight loss to improve his health. He was advised of the need for long term treatment and the importance of lifestyle modifications.  AGREE: Multiple dietary modification options and treatment options were discussed and  Devaunte agreed to keep a food journal with 450 to 650 calories and 40 grams of protein  and follow the Category 3 plan We discussed the following Behavioral Modification Strategies today: increasing lean protein intake and increasing H2O

## 2017-03-05 ENCOUNTER — Ambulatory Visit (INDEPENDENT_AMBULATORY_CARE_PROVIDER_SITE_OTHER): Payer: BLUE CROSS/BLUE SHIELD | Admitting: Family Medicine

## 2017-03-05 VITALS — BP 101/64 | HR 60 | Temp 98.0°F | Ht 70.0 in | Wt 217.0 lb

## 2017-03-05 DIAGNOSIS — K5909 Other constipation: Secondary | ICD-10-CM | POA: Diagnosis not present

## 2017-03-05 DIAGNOSIS — I10 Essential (primary) hypertension: Secondary | ICD-10-CM

## 2017-03-05 DIAGNOSIS — K219 Gastro-esophageal reflux disease without esophagitis: Secondary | ICD-10-CM | POA: Diagnosis not present

## 2017-03-05 DIAGNOSIS — Z6831 Body mass index (BMI) 31.0-31.9, adult: Secondary | ICD-10-CM | POA: Diagnosis not present

## 2017-03-05 DIAGNOSIS — E119 Type 2 diabetes mellitus without complications: Secondary | ICD-10-CM

## 2017-03-05 DIAGNOSIS — E669 Obesity, unspecified: Secondary | ICD-10-CM

## 2017-03-05 MED ORDER — GLUCOSE BLOOD VI STRP: ORAL_STRIP | 0 refills | Status: DC

## 2017-03-05 MED ORDER — POLYETHYLENE GLYCOL 3350 17 GM/SCOOP PO POWD: 1.0000 | Freq: Once | ORAL | 0 refills | Status: AC

## 2017-03-05 NOTE — Progress Notes (Signed)
Office: [phone]  /  Fax: [phone]   HPI:   Chief Complaint: OBESITY [name] Foster is here to discuss his progress with his obesity treatment plan. He is on the  follow the Category 3 plan and is following his eating plan approximately 100 % of the time. He states he is doing yard work for exercise 60 to 120 minutes 1 to 2 times per week. [name] continues to do well with weight loss, hunger is controlled and he is not bored with category 3 plan. He is doing yard work for exercise and is trying to stay hydrated. His weight is 217 lb (98.4 kg) today and has had a weight loss of 4 pounds over a period of 3 weeks since his last visit. He has lost 35 lbs since starting treatment with [place].  Diabetes II Cortavious has a diagnosis of diabetes type II. He is on metformin and has no GI upset. [name] states fasting BGs are in the 120's on average and denies any hypoglycemic episodes. He has been working on intensive lifestyle modifications including diet, exercise, and weight loss to help control his blood glucose levels.  Constipation [name] notes decreased BM frequency, worse since attempting weight loss. He states BM are less frequent and are hard and somewhat painful. He denies hematochezia or melena. He is trying to increase H2O and is eating 3 to 4 servings of vegetables daily.  Hypertension [name] is a 51 y.o. male with hypertension.  [name] denies chest pain or dizziness. His blood pressure has decreased since losing weight. He is working weight loss to help control his blood pressure with the goal of decreasing his risk of heart attack and stroke. Tyrones blood pressure is currently controlled.  Gastroesophageal Reflux Disease [name] is on zantac nightly and has no further symptoms since losing weight.  ALLERGIES: Allergies  Allergen Reactions  . Hydrocodone Itching    MEDICATIONS: Current Outpatient Prescriptions on File Prior to Visit  Medication Sig Dispense Refill  .  acetaminophen (TYLENOL) 500 MG tablet Take 1 tablet (500 mg total) by mouth every 6 (six) hours as needed. 60 tablet 0  . amitriptyline (ELAVIL) 10 MG tablet Take 1 tablet (10 mg total) by mouth at bedtime. 30 tablet 5  . aspirin EC 81 MG tablet Take 81 mg by mouth daily.    [name] atorvastatin (LIPITOR) 40 MG tablet Take 1 tablet (40 mg total) by mouth daily. 90 tablet 3  . buPROPion (WELLBUTRIN SR) 150 MG 12 hr tablet Take 1 tablet (150 mg total) by mouth every morning. 30 tablet 0  . dicyclomine (BENTYL) 20 MG tablet Take 1 tablet (20 mg total) by mouth 3 (three) times daily as needed for spasms. 90 tablet 5  . esomeprazole (NEXIUM) 40 MG capsule Take 1 capsule (40 mg total) by mouth daily. 30 capsule 3  . fluticasone (FLONASE) 50 MCG/ACT nasal spray Place 2 sprays into both nostrils daily. 16 g 6  . losartan (COZAAR) 50 MG tablet Take 1 tablet (50 mg total) by mouth daily. 90 tablet 2  . metFORMIN (GLUCOPHAGE) 500 MG tablet Take 1 tablet (500 mg total) by mouth every morning. 30 tablet 0  . Multiple Vitamin (MULTIVITAMIN) tablet Take 1 tablet by mouth daily.    . NONFORMULARY OR COMPOUNDED ITEM     . olopatadine (PATANOL) 0.1 % ophthalmic solution Place 1 drop into both eyes 2 (two) times daily. 5 mL 1  . Probiotic Product (PROBIOTIC DAILY) CAPS Take 1 capsule  1 to 2 times daily between meals or on an empty stomach. 100 capsule 1  . ranitidine (ZANTAC) 300 MG tablet Take 1 tablet (300 mg total) by mouth at bedtime. (Patient taking differently: Take 300 mg by mouth at bedtime. Take every other day) 30 tablet 5  . tadalafil (CIALIS) 5 MG tablet Take 1 tablet (5 mg total) by mouth daily as needed for erectile dysfunction. 30 tablet 5  . triamterene-hydrochlorothiazide (MAXZIDE-25) 37.5-25 MG tablet TAKE ONE TABLET BY MOUTH ONCE DAILY (Patient taking differently: Take one half daily) 30 tablet 0  . Vitamin D, Ergocalciferol, (DRISDOL) 50000 units CAPS capsule Take 1 capsule (50,000 Units total) by mouth  every 7 (seven) days. 4 capsule 0   No current facility-administered medications on file prior to visit.     PAST MEDICAL HISTORY: Past Medical History:  Diagnosis Date  . Bone spur    left foot  . Chest pain   . Diabetes mellitus type 2 in obese ([place]) 07/13/2013  . Diverticulosis   . Erectile dysfunction 01/15/2013  . Esophageal reflux 01/15/2013  . Fatty liver 07/04/2015  . GERD (gastroesophageal reflux disease)   . Hiatal hernia   . Hiatal hernia with gastroesophageal reflux 03/20/2014  . Hyperglycemia 07/13/2013  . Hyperplastic colon polyp   . Hypertension   . Internal hemorrhoids   . Mixed hyperlipidemia   . Murmur   . OSA (obstructive sleep apnea)    s/p UPPP  . Plantar fasciitis   . Prediabetes   . Reflux esophagitis   . Stomach ulcer    from PCP  . Urinary hesitancy 09/30/2015    PAST SURGICAL HISTORY: Past Surgical History:  Procedure Laterality Date  . [place] STUDY N/A 02/28/2016   Procedure: [place] STUDY;  Surgeon: Jerene Bears, MD;  Location: [facility];  Service: Gastroenterology;  Laterality: N/A;  . ESOPHAGEAL MANOMETRY N/A 09/01/2013   Procedure: ESOPHAGEAL MANOMETRY (EM);  Surgeon: Sable Feil, MD;  Location: [facility];  Service: Endoscopy;  Laterality: N/A;  . ESOPHAGEAL MANOMETRY N/A 02/28/2016   Procedure: ESOPHAGEAL MANOMETRY (EM);  Surgeon: Jerene Bears, MD;  Location: [facility];  Service: Gastroenterology;  Laterality: N/A;  . TONSILLECTOMY    . UVULECTOMY      SOCIAL HISTORY: Social History  Substance Use Topics  . Smoking status: Never Smoker  . Smokeless tobacco: Never Used  . Alcohol use No    FAMILY HISTORY: Family History  Problem Relation Age of Onset  . Dementia Mother   . Diabetes Mother   . Hypertension Mother   . Heart failure Mother   . Stroke Mother   . Obesity Mother   . Hypertension Unknown        siblings  . Diabetes Sister   . Diabetes Brother   . Heart attack Maternal Grandmother   . Diabetes  Sister   . Pancreatic disease Sister   . Obesity Father   . Colon cancer Neg Hx     ROS: Review of Systems  Constitutional: Positive for weight loss.  Cardiovascular: Negative for chest pain.  Gastrointestinal: Positive for constipation. Negative for blood in stool, diarrhea and nausea.  Neurological: Negative for dizziness.    PHYSICAL EXAM: Blood pressure 101/64, pulse 60, temperature 98 F (36.7 C), temperature source Oral, height 5\' 10"  (1.778 m), weight 217 lb (98.4 kg), SpO2 99 %. Body mass index is 31.14 kg/m. Physical Exam  Constitutional: He is oriented to person, place, and time. He appears  well-developed.  Cardiovascular: Normal rate.   Pulmonary/Chest: Effort normal.  Musculoskeletal: Normal range of motion.  Neurological: He is oriented to person, place, and time.  Skin: Skin is warm and dry.  Psychiatric: He has a normal mood and affect. His behavior is normal.  Vitals reviewed.   RECENT LABS AND TESTS: BMET    Component Value Date/Time   NA 141 11/27/2016 1158   K 4.0 11/27/2016 1158   CL 101 11/27/2016 1158   CO2 17 (L) 11/27/2016 1158   GLUCOSE 85 11/27/2016 1158   GLUCOSE 98 09/21/2016 0911   BUN 17 11/27/2016 1158   CREATININE 0.94 11/27/2016 1158   CREATININE 1.08 05/05/2014 1518   CALCIUM 9.7 11/27/2016 1158   GFRNONAA 95 11/27/2016 1158   GFRNONAA >60 01/04/2011 0911   GFRAA 110 11/27/2016 1158   GFRAA >60 01/04/2011 0911   Lab Results  Component Value Date   HGBA1C 6.1 (H) 11/27/2016   HGBA1C 6.2 09/21/2016   HGBA1C 6.6 (H) 05/22/2016   HGBA1C 6.4 09/30/2015   HGBA1C 6.2 06/24/2015   Lab Results  Component Value Date   INSULIN 28.3 (H) 11/27/2016   CBC    Component Value Date/Time   WBC 4.5 11/27/2016 1158   WBC 4.4 09/21/2016 0911   RBC 5.50 11/27/2016 1158   RBC 5.44 09/21/2016 0911   HGB 15.8 11/27/2016 1158   HCT 46.5 11/27/2016 1158   PLT 219.0 09/21/2016 0911   MCV 85 11/27/2016 1158   MCH 28.7 11/27/2016 1158   MCH  28.7 05/05/2014 1518   MCHC 34.0 11/27/2016 1158   MCHC 34.1 09/21/2016 0911   RDW 14.1 11/27/2016 1158   LYMPHSABS 1.8 11/27/2016 1158   MONOABS 0.4 03/23/2015 1257   EOSABS 0.1 11/27/2016 1158   BASOSABS 0.0 11/27/2016 1158   Iron/TIBC/Ferritin/ %Sat No results found for: IRON, TIBC, FERRITIN, IRONPCTSAT Lipid Panel     Component Value Date/Time   CHOL 168 11/27/2016 1158   TRIG 88 11/27/2016 1158   HDL 38 (L) 11/27/2016 1158   CHOLHDL 5 09/21/2016 0911   VLDL 16.6 09/21/2016 0911   LDLCALC 112 (H) 11/27/2016 1158   LDLDIRECT 173.0 05/22/2016 1648   Hepatic Function Panel     Component Value Date/Time   PROT 7.4 11/27/2016 1158   ALBUMIN 4.6 11/27/2016 1158   AST 27 11/27/2016 1158   ALT 37 11/27/2016 1158   ALKPHOS 76 11/27/2016 1158   BILITOT 0.4 11/27/2016 1158   BILIDIR 0.1 05/05/2014 1518   IBILI 0.3 05/05/2014 1518      Component Value Date/Time   TSH 1.610 11/27/2016 1158   TSH 1.53 09/21/2016 0911   TSH 1.61 07/21/2016 0939    ASSESSMENT AND PLAN: Other constipation - Plan: polyethylene glycol powder (MIRALAX) powder  Type 2 diabetes mellitus without complication, without long-term current use of insulin ([name]) - Plan: glucose blood test strip  Essential hypertension  Gastroesophageal reflux disease without esophagitis  Class 1 obesity without serious comorbidity with body mass index (BMI) of 31.0 to 31.9 in adult, unspecified obesity type  PLAN:  Diabetes II Da has been given extensive diabetes education by myself today including ideal fasting and post-prandial blood glucose readings, individual ideal Hgb A1c goals  and hypoglycemia prevention. We discussed the importance of good blood sugar control to decrease the likelihood of diabetic complications such as nephropathy, neuropathy, limb loss, blindness, coronary artery disease, and death. We discussed the importance of intensive lifestyle modification including diet, exercise and weight loss as  the first line treatment for diabetes. Wilmar agrees to continue metformin, we will refill one touch strips bid #100 with no refills and lancets #100 with no refills and will follow up at the agreed upon time.  Constipation Kristina was informed decrease bowel movement frequency is normal while losing weight, but stools should not be hard or painful. He was advised to increase his H20 intake and work on increasing his fiber intake. High fiber foods were discussed today.  Hypertension We discussed sodium restriction, working on healthy weight loss, and a regular exercise program as the means to achieve improved blood pressure control. Brylan agreed with this plan and agreed to follow up as directed. We will continue to monitor his blood pressure as well as his progress with the above lifestyle modifications. He will continue his medications as prescribed and will watch for signs of hypotension as he continues his lifestyle modifications.  Gastroesophageal Reflux Disease Estes agrees to decrease zantac to qod and will follow up with our clinic in 2 to 3 weeks. He agrees to continue with diet, exercise and weight loss and may be able to discontinue zantac altogether.  Obesity Christepher is currently in the action stage of change. As such, his goal is to continue with weight loss efforts He has agreed to follow the Category 3 plan Corrado has been instructed to work up to a goal of 150 minutes of combined cardio and strengthening exercise per week for weight loss and overall health benefits. We discussed the following Behavioral Modification Strategies today: increase H2O, increase high fiber foods, increasing lean protein intake and decrease eating out  Jeren has agreed to follow up with our clinic in 2 to 3 weeks. He was informed of the importance of frequent follow up visits to maximize his success with intensive lifestyle modifications for his multiple health conditions.  I, Doreene Nest, am acting  as transcriptionist for Dennard Nip, MD  I have reviewed the above documentation for accuracy and completeness, and I agree with the above. -Dennard Nip, MD  OBESITY BEHAVIORAL INTERVENTION VISIT  Today's visit was # 6 out of 22.  Starting weight: 252 lbs Starting date: 11/27/16 Today's weight : 217 lbs  Today's date: 03/05/2017 Total lbs lost to date: 1 (Patients must lose 7 lbs in the first 6 months to continue with counseling)   ASK: We discussed the diagnosis of obesity with Donney E Molner today and Wali agreed to give [place] permission to discuss obesity behavioral modification therapy today.  ASSESS: Deandre has the diagnosis of obesity and his BMI today is 31.2 Demarius is in the action stage of change   ADVISE: Jaquavian was educated on the multiple health risks of obesity as well as the benefit of weight loss to improve his health. He was advised of the need for long term treatment and the importance of lifestyle modifications.  AGREE: Multiple dietary modification options and treatment options were discussed and  Zamari agreed to follow the Category 3 plan We discussed the following Behavioral Modification Strategies today: increase H2O intake, increase high fiber foods, increasing lean protein intake and decrease eating out

## 2017-03-12 ENCOUNTER — Other Ambulatory Visit (INDEPENDENT_AMBULATORY_CARE_PROVIDER_SITE_OTHER): Payer: Self-pay

## 2017-03-12 DIAGNOSIS — E119 Type 2 diabetes mellitus without complications: Secondary | ICD-10-CM

## 2017-03-12 MED ORDER — BAYER MICROLET LANCETS MISC: 0 refills | Status: DC

## 2017-03-12 MED ORDER — GLUCOSE BLOOD VI STRP: ORAL_STRIP | 0 refills | Status: DC

## 2017-03-12 MED ORDER — BAYER CONTOUR MONITOR DEVI: 0 refills | Status: DC

## 2017-03-15 ENCOUNTER — Other Ambulatory Visit (INDEPENDENT_AMBULATORY_CARE_PROVIDER_SITE_OTHER): Payer: Self-pay

## 2017-03-15 DIAGNOSIS — E119 Type 2 diabetes mellitus without complications: Secondary | ICD-10-CM

## 2017-03-15 MED ORDER — GLUCOSE BLOOD VI STRP: ORAL_STRIP | 0 refills | Status: DC

## 2017-03-15 MED ORDER — CONTOUR NEXT MONITOR W/DEVICE KIT: 1.0000 | PACK | Freq: Two times a day (BID) | 0 refills | Status: DC

## 2017-03-26 ENCOUNTER — Ambulatory Visit (INDEPENDENT_AMBULATORY_CARE_PROVIDER_SITE_OTHER): Payer: BLUE CROSS/BLUE SHIELD | Admitting: Physician Assistant

## 2017-03-26 VITALS — BP 129/80 | HR 51 | Temp 97.8°F | Ht 70.0 in | Wt 220.0 lb

## 2017-03-26 DIAGNOSIS — E559 Vitamin D deficiency, unspecified: Secondary | ICD-10-CM | POA: Diagnosis not present

## 2017-03-26 DIAGNOSIS — E119 Type 2 diabetes mellitus without complications: Secondary | ICD-10-CM

## 2017-03-26 DIAGNOSIS — Z6831 Body mass index (BMI) 31.0-31.9, adult: Secondary | ICD-10-CM

## 2017-03-26 DIAGNOSIS — E669 Obesity, unspecified: Secondary | ICD-10-CM | POA: Diagnosis not present

## 2017-03-26 MED ORDER — VITAMIN D (ERGOCALCIFEROL) 1.25 MG (50000 UNIT) PO CAPS: 50000.0000 [IU] | ORAL_CAPSULE | ORAL | 0 refills | Status: DC

## 2017-03-26 MED ORDER — METFORMIN HCL 500 MG PO TABS: 500.0000 mg | ORAL_TABLET | Freq: Two times a day (BID) | ORAL | 0 refills | Status: DC

## 2017-03-26 NOTE — Progress Notes (Signed)
Office: [phone]  /  Fax: [phone]   HPI:   Chief Complaint: OBESITY [name] Foster is here to discuss his progress with his obesity treatment plan. He is on the  follow the Category 3 plan and is following his eating plan approximately 85 % of the time. He states he is exercising 0 minutes 0 times per week. [name] has struggled with weight loss over the past 2 weeks, states due to busy schedule, he has not been able to plan ahead as well. His weight is 220 lb (99.8 kg) today and has had a weight gain of 3 pounds since his last visit. He has lost 32 lbs since starting treatment with [place].  Vitamin D deficiency [name] has a diagnosis of vitamin D deficiency. He is currently taking vit D and denies nausea, vomiting or muscle weakness.  Diabetes II [name] has a diagnosis of diabetes type II. [name] states patient does not check sugars and denies any hypoglycemic episodes. Last A1c was 6.1 on 11/27/16. He has been working on intensive lifestyle modifications including diet, exercise, and weight loss to help control his blood glucose levels. Has increase polyphagia in the evening.   ALLERGIES: Allergies  Allergen Reactions  . Hydrocodone Itching    MEDICATIONS: Current Outpatient Prescriptions on File Prior to Visit  Medication Sig Dispense Refill  . acetaminophen (TYLENOL) 500 MG tablet Take 1 tablet (500 mg total) by mouth every 6 (six) hours as needed. 60 tablet 0  . amitriptyline (ELAVIL) 10 MG tablet Take 1 tablet (10 mg total) by mouth at bedtime. 30 tablet 5  . aspirin EC 81 MG tablet Take 81 mg by mouth daily.    [name] atorvastatin (LIPITOR) 40 MG tablet Take 1 tablet (40 mg total) by mouth daily. 90 tablet 3  . BAYER MICROLET LANCETS lancets Test blood sugars twice daily 100 each 0  . Blood Glucose Monitoring Suppl (CONTOUR NEXT MONITOR) w/Device KIT 1 kit by Does not apply route 2 (two) times daily. 1 kit 0  . buPROPion (WELLBUTRIN SR) 150 MG 12 hr tablet Take 1 tablet (150 mg total)  by mouth every morning. 30 tablet 0  . dicyclomine (BENTYL) 20 MG tablet Take 1 tablet (20 mg total) by mouth 3 (three) times daily as needed for spasms. 90 tablet 5  . esomeprazole (NEXIUM) 40 MG capsule Take 1 capsule (40 mg total) by mouth daily. 30 capsule 3  . fluticasone (FLONASE) 50 MCG/ACT nasal spray Place 2 sprays into both nostrils daily. 16 g 6  . glucose blood (CONTOUR NEXT TEST) test strip Test blood sugars twice daily 100 each 0  . losartan (COZAAR) 50 MG tablet Take 1 tablet (50 mg total) by mouth daily. 90 tablet 2  . Multiple Vitamin (MULTIVITAMIN) tablet Take 1 tablet by mouth daily.    . NONFORMULARY OR COMPOUNDED ITEM     . olopatadine (PATANOL) 0.1 % ophthalmic solution Place 1 drop into both eyes 2 (two) times daily. 5 mL 1  . Probiotic Product (PROBIOTIC DAILY) CAPS Take 1 capsule 1 to 2 times daily between meals or on an empty stomach. 100 capsule 1  . ranitidine (ZANTAC) 300 MG tablet Take 1 tablet (300 mg total) by mouth at bedtime. (Patient taking differently: Take 300 mg by mouth at bedtime. Take every other day) 30 tablet 5  . tadalafil (CIALIS) 5 MG tablet Take 1 tablet (5 mg total) by mouth daily as needed for erectile dysfunction. 30 tablet 5  . triamterene-hydrochlorothiazide (MAXZIDE-25) 37.5-25 MG  tablet TAKE ONE TABLET BY MOUTH ONCE DAILY (Patient taking differently: Take one half daily) 30 tablet 0   No current facility-administered medications on file prior to visit.     PAST MEDICAL HISTORY: Past Medical History:  Diagnosis Date  . Bone spur    left foot  . Chest pain   . Diabetes mellitus type 2 in obese ([place]) 07/13/2013  . Diverticulosis   . Erectile dysfunction 01/15/2013  . Esophageal reflux 01/15/2013  . Fatty liver 07/04/2015  . GERD (gastroesophageal reflux disease)   . Hiatal hernia   . Hiatal hernia with gastroesophageal reflux 03/20/2014  . Hyperglycemia 07/13/2013  . Hyperplastic colon polyp   . Hypertension   . Internal hemorrhoids   .  Mixed hyperlipidemia   . Murmur   . OSA (obstructive sleep apnea)    s/p UPPP  . Plantar fasciitis   . Prediabetes   . Reflux esophagitis   . Stomach ulcer    from PCP  . Urinary hesitancy 09/30/2015    PAST SURGICAL HISTORY: Past Surgical History:  Procedure Laterality Date  . [place] STUDY N/A 02/28/2016   Procedure: [place] STUDY;  Surgeon: Jerene Bears, MD;  Location: [facility];  Service: Gastroenterology;  Laterality: N/A;  . ESOPHAGEAL MANOMETRY N/A 09/01/2013   Procedure: ESOPHAGEAL MANOMETRY (EM);  Surgeon: Sable Feil, MD;  Location: [facility];  Service: Endoscopy;  Laterality: N/A;  . ESOPHAGEAL MANOMETRY N/A 02/28/2016   Procedure: ESOPHAGEAL MANOMETRY (EM);  Surgeon: Jerene Bears, MD;  Location: [facility];  Service: Gastroenterology;  Laterality: N/A;  . TONSILLECTOMY    . UVULECTOMY      SOCIAL HISTORY: Social History  Substance Use Topics  . Smoking status: Never Smoker  . Smokeless tobacco: Never Used  . Alcohol use No    FAMILY HISTORY: Family History  Problem Relation Age of Onset  . Dementia Mother   . Diabetes Mother   . Hypertension Mother   . Heart failure Mother   . Stroke Mother   . Obesity Mother   . Hypertension Unknown        siblings  . Diabetes Sister   . Diabetes Brother   . Heart attack Maternal Grandmother   . Diabetes Sister   . Pancreatic disease Sister   . Obesity Father   . Colon cancer Neg Hx     ROS: Review of Systems  Gastrointestinal: Negative for nausea and vomiting.  Musculoskeletal: Negative.        Muscle weakness  Endo/Heme/Allergies: Negative.         Negative hypoglycemia Positive Polyphagia    PHYSICAL EXAM: Blood pressure 129/80, pulse (!) 51, temperature 97.8 F (36.6 C), temperature source Oral, height '5\' 10"'  (1.778 m), weight 220 lb (99.8 kg), SpO2 99 %. Body mass index is 31.57 kg/m. Physical Exam  Constitutional: He is oriented to person, place, and time. He appears  well-developed and well-nourished.  Cardiovascular: Normal rate.   Pulmonary/Chest: Effort normal.  Musculoskeletal: Normal range of motion.  Neurological: He is alert and oriented to person, place, and time.  Skin: Skin is warm and dry.    RECENT LABS AND TESTS: BMET    Component Value Date/Time   NA 141 11/27/2016 1158   K 4.0 11/27/2016 1158   CL 101 11/27/2016 1158   CO2 17 (L) 11/27/2016 1158   GLUCOSE 85 11/27/2016 1158   GLUCOSE 98 09/21/2016 0911   BUN 17 11/27/2016 1158   CREATININE 0.94 11/27/2016  1158   CREATININE 1.08 05/05/2014 1518   CALCIUM 9.7 11/27/2016 1158   GFRNONAA 95 11/27/2016 1158   GFRNONAA >60 01/04/2011 0911   GFRAA 110 11/27/2016 1158   GFRAA >60 01/04/2011 0911   Lab Results  Component Value Date   HGBA1C 6.1 (H) 11/27/2016   HGBA1C 6.2 09/21/2016   HGBA1C 6.6 (H) 05/22/2016   HGBA1C 6.4 09/30/2015   HGBA1C 6.2 06/24/2015   Lab Results  Component Value Date   INSULIN 28.3 (H) 11/27/2016   CBC    Component Value Date/Time   WBC 4.5 11/27/2016 1158   WBC 4.4 09/21/2016 0911   RBC 5.50 11/27/2016 1158   RBC 5.44 09/21/2016 0911   HGB 15.8 11/27/2016 1158   HCT 46.5 11/27/2016 1158   PLT 219.0 09/21/2016 0911   MCV 85 11/27/2016 1158   MCH 28.7 11/27/2016 1158   MCH 28.7 05/05/2014 1518   MCHC 34.0 11/27/2016 1158   MCHC 34.1 09/21/2016 0911   RDW 14.1 11/27/2016 1158   LYMPHSABS 1.8 11/27/2016 1158   MONOABS 0.4 03/23/2015 1257   EOSABS 0.1 11/27/2016 1158   BASOSABS 0.0 11/27/2016 1158   Iron/TIBC/Ferritin/ %Sat No results found for: IRON, TIBC, FERRITIN, IRONPCTSAT Lipid Panel     Component Value Date/Time   CHOL 168 11/27/2016 1158   TRIG 88 11/27/2016 1158   HDL 38 (L) 11/27/2016 1158   CHOLHDL 5 09/21/2016 0911   VLDL 16.6 09/21/2016 0911   LDLCALC 112 (H) 11/27/2016 1158   LDLDIRECT 173.0 05/22/2016 1648   Hepatic Function Panel     Component Value Date/Time   PROT 7.4 11/27/2016 1158   ALBUMIN 4.6 11/27/2016  1158   AST 27 11/27/2016 1158   ALT 37 11/27/2016 1158   ALKPHOS 76 11/27/2016 1158   BILITOT 0.4 11/27/2016 1158   BILIDIR 0.1 05/05/2014 1518   IBILI 0.3 05/05/2014 1518      Component Value Date/Time   TSH 1.610 11/27/2016 1158   TSH 1.53 09/21/2016 0911   TSH 1.61 07/21/2016 0939    ASSESSMENT AND PLAN: Vitamin D deficiency - Plan: Vitamin D, Ergocalciferol, (DRISDOL) 50000 units CAPS capsule  Type 2 diabetes mellitus without complication, without long-term current use of insulin ([name]) - Plan: metFORMIN (GLUCOPHAGE) 500 MG tablet  Class 1 obesity with serious comorbidity and body mass index (BMI) of 31.0 to 31.9 in adult, unspecified obesity type  PLAN:  Vitamin D Deficiency Gautam was informed that low vitamin D levels contributes to fatigue and are associated with obesity, breast, and colon cancer. He agrees to continue to take prescription Vit D '@50' ,000 IU every week,a refill was written today, and will follow up for routine testing of vitamin D, in 2 weeks. He was informed of the risk of over-replacement of vitamin D and agrees to not increase his dose unless he discusses this with [place] first.  Diabetes II Jerri has been given extensive diabetes education by myself today including ideal fasting and post-prandial blood glucose readings, individual ideal HgA1c goals  and hypoglycemia prevention. We discussed the importance of good blood sugar control to decrease the likelihood of diabetic complications such as nephropathy, neuropathy, limb loss, blindness, coronary artery disease, and death. We discussed the importance of intensive lifestyle modification including diet, exercise and weight loss as the first line treatment for diabetes. Dillyn agrees to increase his metformin to 572m PO BID, to better control polyphagia, and a prescription was written today.  He will follow up at the agreed upon time and  we will recheck labs in 2 weeks.  Obesity Quantel is currently in the action  stage of change. As such, his goal is to continue with weight loss efforts He has agreed to follow the category 3 plan and to keep a food journal with 450-650 calories and 40 protein  for supper. Jobanny has been instructed to work up to a goal of 150 minutes of combined cardio and strengthening exercise per week for weight loss and overall health benefits. We discussed the following Behavioral Modification Stratagies today: increasing lean protein intake and keeping a strict food journal.   Gerrald has agreed to follow up with our clinic in 2 weeks. He was informed of the importance of frequent follow up visits to maximize his success with intensive lifestyle modifications for his multiple health conditions.   Office: [phone]  /  Fax: [phone]  OBESITY BEHAVIORAL INTERVENTION VISIT  Today's visit was #7 out of 22.  Starting weight: 252 Starting date: 11/27/16 Today's weight : Weight: 220 lb (99.8 kg)  Today's date: 03/29/2017 Total lbs lost to date: 21 (Patients must lose 7 lbs in the first 6 months to continue with counseling)   ASK: We discussed the diagnosis of obesity with Erroll E Angulo today and Joanne agreed to give [place] permission to discuss obesity behavioral modification therapy today.  ASSESS: Jadin has the diagnosis of obesity and his BMI today is 31.6 Dashaun is in the action stage of change   ADVISE: Nikai was educated on the multiple health risks of obesity as well as the benefit of weight loss to improve his health. He was advised of the need for long term treatment and the importance of lifestyle modifications.  AGREE: Multiple dietary modification options and treatment options were discussed and  Crystian agreed to keep a food journal with 450-650 calories and 40+g protein at supper and following category 3 plan. We discussed the following Behavioral Modification Stratagies today: increasing lean protein intake and keeping a strict food journal.   I have  reviewed the above documentation for accuracy and completeness, and I agree with the above. -Lacy Duverney, PA-C  I have reviewed the above note and agree with the plan. -Dennard Nip, MD

## 2017-04-03 ENCOUNTER — Other Ambulatory Visit: Payer: Self-pay | Admitting: Family Medicine

## 2017-04-04 ENCOUNTER — Telehealth: Payer: Self-pay | Admitting: Cardiovascular Disease

## 2017-04-04 NOTE — Telephone Encounter (Signed)
Patient called with MD recommendations. He states he voiced understanding and will follow up with PCP and/or GI MD

## 2017-04-04 NOTE — Telephone Encounter (Signed)
He has totally normal coronary arteries and this is not his heart.  Suggest follow up with PCP or GI.  Next available with me or an APP is appropriate.

## 2017-04-04 NOTE — Telephone Encounter (Signed)
Patient call sent to triage as urgent for chest pain Patient has h/o chest pain (per April 2018 note: # Atypical chest pain:  Cardiac CT was negative for CAD.  This is likely related to esophageal spasm and is currently managed by Dr. Hilarie Fredrickson.) Pain is described as constant, feels like air is in there, pressure, when he breathes it hurts/throbs. Chest pain is not r/t to exertion; denies SOB, N/V He went to urgent care yesterday and EKG was performed which patient said was normal He is requesting an appt with Dr. Oval Linsey - advised first available is in Sept Offered APP appointment but he states if all they are going to do is look at the outside of me, like the PA yesterday (at urgent care) then he didn't seem too receptive to this.  He requested a CXR - inquired why he would want this test, as he did not endorse breathing difficulties Advised patient that coronary CT was normal in April Informed him I would send a message to the MD with his concerns and seek recommendations

## 2017-04-09 ENCOUNTER — Ambulatory Visit (INDEPENDENT_AMBULATORY_CARE_PROVIDER_SITE_OTHER): Payer: BLUE CROSS/BLUE SHIELD | Admitting: Physician Assistant

## 2017-04-09 ENCOUNTER — Encounter (INDEPENDENT_AMBULATORY_CARE_PROVIDER_SITE_OTHER): Payer: Self-pay

## 2017-04-11 DIAGNOSIS — K429 Umbilical hernia without obstruction or gangrene: Secondary | ICD-10-CM | POA: Insufficient documentation

## 2017-04-11 DIAGNOSIS — K4020 Bilateral inguinal hernia, without obstruction or gangrene, not specified as recurrent: Secondary | ICD-10-CM | POA: Insufficient documentation

## 2017-05-28 ENCOUNTER — Other Ambulatory Visit: Payer: Self-pay | Admitting: Family Medicine

## 2017-06-23 ENCOUNTER — Other Ambulatory Visit: Payer: Self-pay | Admitting: Family Medicine

## 2017-06-23 ENCOUNTER — Other Ambulatory Visit (INDEPENDENT_AMBULATORY_CARE_PROVIDER_SITE_OTHER): Payer: Self-pay | Admitting: Family Medicine

## 2017-06-23 DIAGNOSIS — E559 Vitamin D deficiency, unspecified: Secondary | ICD-10-CM

## 2017-09-08 ENCOUNTER — Other Ambulatory Visit: Payer: Self-pay | Admitting: Family Medicine

## 2017-10-12 ENCOUNTER — Encounter: Payer: Self-pay | Admitting: Family Medicine

## 2017-10-12 ENCOUNTER — Ambulatory Visit (INDEPENDENT_AMBULATORY_CARE_PROVIDER_SITE_OTHER): Payer: Commercial Managed Care - PPO | Admitting: Family Medicine

## 2017-10-12 VITALS — BP 140/90 | HR 75 | Temp 98.1°F | Ht 72.0 in | Wt 256.0 lb

## 2017-10-12 DIAGNOSIS — I10 Essential (primary) hypertension: Secondary | ICD-10-CM | POA: Diagnosis not present

## 2017-10-12 DIAGNOSIS — J069 Acute upper respiratory infection, unspecified: Secondary | ICD-10-CM

## 2017-10-12 DIAGNOSIS — E119 Type 2 diabetes mellitus without complications: Secondary | ICD-10-CM | POA: Diagnosis not present

## 2017-10-12 MED ORDER — FLUTICASONE PROPIONATE 50 MCG/ACT NA SUSP: 2.0000 | Freq: Every day | NASAL | 6 refills | Status: DC

## 2017-10-12 MED ORDER — TRIAMTERENE-HCTZ 37.5-25 MG PO TABS: 1.0000 | ORAL_TABLET | Freq: Every day | ORAL | 1 refills | Status: DC

## 2017-10-12 MED ORDER — GLUCOSE BLOOD VI STRP: ORAL_STRIP | 0 refills | Status: DC

## 2017-10-12 MED ORDER — METHYLPREDNISOLONE ACETATE 80 MG/ML IJ SUSP
80.0000 mg | Freq: Once | INTRAMUSCULAR | Status: AC
  Administered 2017-10-12: 80 mg via INTRAMUSCULAR

## 2017-10-12 MED ORDER — BAYER MICROLET LANCETS MISC: 0 refills | Status: DC

## 2017-10-12 NOTE — Progress Notes (Signed)
Chief Complaint  Patient presents with  . Nasal Congestion     headache     [name] Foster here for URI complaints.  Duration: 2 days  Associated symptoms: sinus congestion, rhinorrhea, itchy watery eyes and cough Denies: sinus pain, ear pain, ear drainage, sore throat, wheezing, shortness of breath, myalgia and fevers Treatment to date: Aleve, ASA, Dayquil Sick contacts: Yes  Pt has been out of BP med for 2 weeks. Needs refill.   ROS:  Const: Denies fevers HEENT: As noted in HPI Lungs: No SOB  Past Medical History:  Diagnosis Date  . Bone spur    left foot  . Chest pain   . Diabetes mellitus type 2 in obese ([place]) 07/13/2013  . Diverticulosis   . Erectile dysfunction 01/15/2013  . Esophageal reflux 01/15/2013  . Fatty liver 07/04/2015  . GERD (gastroesophageal reflux disease)   . Hiatal hernia   . Hiatal hernia with gastroesophageal reflux 03/20/2014  . Hyperglycemia 07/13/2013  . Hyperplastic colon polyp   . Hypertension   . Internal hemorrhoids   . Mixed hyperlipidemia   . Murmur   . OSA (obstructive sleep apnea)    s/p UPPP  . Plantar fasciitis   . Prediabetes   . Reflux esophagitis   . Stomach ulcer    from PCP  . Urinary hesitancy 09/30/2015   Family History  Problem Relation Age of Onset  . Dementia Mother   . Diabetes Mother   . Hypertension Mother   . Heart failure Mother   . Stroke Mother   . Obesity Mother   . Hypertension Unknown        siblings  . Diabetes Sister   . Diabetes Brother   . Heart attack Maternal Grandmother   . Diabetes Sister   . Pancreatic disease Sister   . Obesity Father   . Colon cancer Neg Hx     BP 140/90 (BP Location: Left Arm, Patient Position: Sitting, Cuff Size: Large)   Pulse 75   Temp 98.1 F (36.7 C) (Oral)   Ht 6' (1.829 m)   Wt 256 lb (116.1 kg)   SpO2 97%   BMI 34.72 kg/m  General: Awake, alert, appears stated age HEENT: AT, [place], ears patent b/l and TM's neg, no sinus ttp, nares patent w/o discharge,  pharynx pink and without exudates, MMM Neck: No masses or asymmetry Heart: RRR Lungs: CTAB, no accessory muscle use Psych: Age appropriate judgment and insight, normal mood and affect  Upper respiratory tract infection, unspecified type - Plan: fluticasone (FLONASE) 50 MCG/ACT nasal spray, methylPREDNISolone acetate (DEPO-MEDROL) injection 80 mg  Type 2 diabetes mellitus without complication, without long-term current use of insulin ([name]) - Plan: glucose blood (CONTOUR NEXT TEST) test strip  Diabetes mellitus without complication ([name]) - Plan: BAYER MICROLET LANCETS lancets  Essential hypertension - Plan: triamterene-hydrochlorothiazide (MAXZIDE-25) 37.5-25 MG tablet  Orders as above. Steroid for itching, congestion and facial pain. Cont ibuprofen/Tylenol, supportive care. Not likely bacterial. Continue to push fluids, practice good hand hygiene, cover mouth when coughing. F/u prn. If starting to experience fevers, shaking, or shortness of breath, seek immediate care. Pt voiced understanding and agreement to the plan.  [place], DO 10/12/17 2:42 PM

## 2017-10-12 NOTE — Patient Instructions (Addendum)
Flonase (fluticasone); nasal spray that is over the counter. 2 sprays each nostril, once daily. Aim towards the same side eye when you spray.  There are available OTC, and the generic versions, which may be cheaper, are in parentheses. Show this to a pharmacist if you have trouble finding any of these items.  Continue to push fluids, practice good hand hygiene, and cover your mouth if you cough.  If you start having fevers, shaking or shortness of breath, seek immediate care.  Send me a MyChart message if things get worse.   Let us know if you need anything.

## 2017-10-12 NOTE — Progress Notes (Signed)
Pre visit review using our clinic review tool, if applicable. No additional management support is needed unless otherwise documented below in the visit note. 

## 2017-11-09 ENCOUNTER — Other Ambulatory Visit: Payer: Self-pay | Admitting: Family Medicine

## 2017-11-09 MED ORDER — ESOMEPRAZOLE MAGNESIUM 40 MG PO CPDR: 40.0000 mg | DELAYED_RELEASE_CAPSULE | Freq: Every day | ORAL | 5 refills | Status: DC

## 2017-11-09 NOTE — Telephone Encounter (Signed)
Refill of Nexium  LOV 10/12/17  Dr. Erin Sons  Rx verified

## 2017-11-09 NOTE — Telephone Encounter (Signed)
ALSO, pt is completely out of his medication. I did remind pt's spouse of refill turnaround time. She apologies and stated that she is aware, pt just made her aware today that he is out of medication.      Pt would also like a 90 day supply of medication because it is cheaper for him

## 2017-11-09 NOTE — Telephone Encounter (Signed)
Copied from [place] [phone]. Topic: Quick Communication - Rx Refill/Question >> Nov 09, 2017  9:41 AM Antonieta Iba C wrote:   Medication: esomeprazole (NEXIUM) 40 MG capsule   Has the patient contacted their pharmacy? yes   (Agent: If no, request that the patient contact the pharmacy for the refill.)   Preferred Pharmacy (with phone number or street name): [facility]    Agent: Please be advised that RX refills may take up to 3 business days. We ask that you follow-up with your pharmacy.

## 2017-11-21 MED ORDER — ESOMEPRAZOLE MAGNESIUM 40 MG PO CPDR: 40.0000 mg | DELAYED_RELEASE_CAPSULE | Freq: Every day | ORAL | 1 refills | Status: DC

## 2017-11-21 NOTE — Telephone Encounter (Signed)
Last OV 09/21/16 follow-up                10/12/17  Acute Next appt. 03/21/18 PCP: Dr. Marinda Elk. [facility] [address] in [place]  Requesting 90 day supply on Nexium

## 2017-11-21 NOTE — Telephone Encounter (Signed)
Wife calling back because pt had requested a 90 day of the  esomeprazole (NEXIUM) 40 MG capsule  Only 30 day with 5 refills sent in.  Pt has a coupon and only good for a 90 day supply.  Can you resend please? Please note new pharmacy as well.  [place], [place] [phone] (Phone) [phone] (Fax)

## 2017-11-21 NOTE — Addendum Note (Signed)
Addended by: Denman George on: 11/21/2017 01:04 PM   Modules accepted: Orders

## 2017-12-06 ENCOUNTER — Ambulatory Visit (HOSPITAL_BASED_OUTPATIENT_CLINIC_OR_DEPARTMENT_OTHER)
Admission: RE | Admit: 2017-12-06 | Discharge: 2017-12-06 | Disposition: A | Discharge status: Home / Self Care | Payer: Commercial Managed Care - PPO | Source: Ambulatory Visit | Attending: Family | Admitting: Family

## 2017-12-06 ENCOUNTER — Ambulatory Visit (INDEPENDENT_AMBULATORY_CARE_PROVIDER_SITE_OTHER): Payer: Commercial Managed Care - PPO | Admitting: Family

## 2017-12-06 ENCOUNTER — Encounter: Payer: Self-pay | Admitting: Family

## 2017-12-06 ENCOUNTER — Other Ambulatory Visit: Payer: Self-pay

## 2017-12-06 VITALS — BP 156/87 | HR 70 | Temp 98.1°F | Resp 16 | Ht 72.0 in | Wt 259.4 lb

## 2017-12-06 DIAGNOSIS — R0789 Other chest pain: Secondary | ICD-10-CM | POA: Diagnosis not present

## 2017-12-06 DIAGNOSIS — R059 Cough, unspecified: Secondary | ICD-10-CM

## 2017-12-06 DIAGNOSIS — I10 Essential (primary) hypertension: Secondary | ICD-10-CM

## 2017-12-06 DIAGNOSIS — N529 Male erectile dysfunction, unspecified: Secondary | ICD-10-CM | POA: Diagnosis not present

## 2017-12-06 DIAGNOSIS — R52 Pain, unspecified: Secondary | ICD-10-CM | POA: Diagnosis not present

## 2017-12-06 DIAGNOSIS — R05 Cough: Secondary | ICD-10-CM | POA: Diagnosis not present

## 2017-12-06 DIAGNOSIS — R109 Unspecified abdominal pain: Secondary | ICD-10-CM | POA: Diagnosis not present

## 2017-12-06 DIAGNOSIS — J208 Acute bronchitis due to other specified organisms: Secondary | ICD-10-CM

## 2017-12-06 DIAGNOSIS — N401 Enlarged prostate with lower urinary tract symptoms: Secondary | ICD-10-CM

## 2017-12-06 LAB — POC URINALSYSI DIPSTICK (AUTOMATED)
Bilirubin, UA: NEGATIVE
Blood, UA: NEGATIVE
GLUCOSE UA: NEGATIVE
KETONES UA: NEGATIVE
Leukocytes, UA: NEGATIVE
Nitrite, UA: NEGATIVE
Protein, UA: NEGATIVE
SPEC GRAV UA: 1.01 (ref 1.010–1.025)
Urobilinogen, UA: 0.2 E.U./dL
pH, UA: 7.5 (ref 5.0–8.0)

## 2017-12-06 LAB — POC INFLUENZA A&B (BINAX/QUICKVUE)
Influenza A, POC: NEGATIVE
Influenza B, POC: NEGATIVE

## 2017-12-06 MED ORDER — ALBUTEROL SULFATE HFA 108 (90 BASE) MCG/ACT IN AERS: 2.0000 | INHALATION_SPRAY | Freq: Four times a day (QID) | RESPIRATORY_TRACT | 0 refills | Status: DC | PRN

## 2017-12-06 MED ORDER — LOSARTAN POTASSIUM 100 MG PO TABS
100.0000 mg | ORAL_TABLET | Freq: Every day | ORAL | 3 refills | Status: DC
Start: 2017-12-06 — End: 2018-07-02

## 2017-12-06 MED ORDER — TADALAFIL 5 MG PO TABS: 5.0000 mg | ORAL_TABLET | Freq: Every day | ORAL | 5 refills | Status: DC | PRN

## 2017-12-06 MED ORDER — MELOXICAM 7.5 MG PO TABS: 7.5000 mg | ORAL_TABLET | Freq: Every day | ORAL | 0 refills | Status: DC

## 2017-12-06 MED ORDER — OMEPRAZOLE 40 MG PO CPDR: 40.0000 mg | DELAYED_RELEASE_CAPSULE | Freq: Every day | ORAL | 1 refills | Status: DC

## 2017-12-06 NOTE — Patient Instructions (Addendum)
Complete chest x ray on the first floor. Complete lab work prior to leaving.  Begin albuterol 2 puffs as needed for wheezing. Please begin cialis once daily- let us know if you have issues picking up with your insurance. Start meloxicam once daily for the next 1-2 weeks for your back pain. Increase losartan from 50mg  to 100mg  once daily. Call if symptoms worsen or if not improved in 1 week.

## 2017-12-06 NOTE — Progress Notes (Signed)
Subjective:    Patient ID: [name] Foster, male    DOB: Aug 22, 1966, 52 y.o.   MRN: [id number]  HPI  [name] is a 52 yr old male who presents today with c/o chest tightness with breath. Has associated chest pressure. + cough, uncomfortable to take a deep breath. + nasal congestion.  He reports bilateral low back pain L>R.  Denies associated fever but has had some body aches and headaches.  Symptoms started 4 days ago.   Back pain is worse with certain movements but it is present even at rest.  It "was dull and aching, but occasional prick or stabbing sensation." He denies dysuria or hematuria but notes some hesitency.    Took dayquil one week ago. Also using aspirin/ibuprofen, cranberry juice.  Lab Results  Component Value Date   CREATININE 0.94 11/27/2016     Review of Systems See HPI  Past Medical History:  Diagnosis Date  . Bone spur    left foot  . Chest pain   . Diabetes mellitus type 2 in obese ([place]) 07/13/2013  . Diverticulosis   . Erectile dysfunction 01/15/2013  . Esophageal reflux 01/15/2013  . Fatty liver 07/04/2015  . GERD (gastroesophageal reflux disease)   . Hiatal hernia   . Hiatal hernia with gastroesophageal reflux 03/20/2014  . Hyperglycemia 07/13/2013  . Hyperplastic colon polyp   . Hypertension   . Internal hemorrhoids   . Mixed hyperlipidemia   . Murmur   . OSA (obstructive sleep apnea)    s/p UPPP  . Plantar fasciitis   . Prediabetes   . Reflux esophagitis   . Stomach ulcer    from PCP  . Urinary hesitancy 09/30/2015     Social History   Socioeconomic History  . Marital status: Married    Spouse name: [name]  . Number of children: Not on file  . Years of education: Not on file  . Highest education level: Not on file  Occupational History  . Occupation: Nature conservation officer: [employer]  . Financial resource strain: Not on file  . Food insecurity:    Worry: Not on file    Inability: Not on file  .  Transportation needs:    Medical: Not on file    Non-medical: Not on file  Tobacco Use  . Smoking status: Never Smoker  . Smokeless tobacco: Never Used  Substance and Sexual Activity  . Alcohol use: No  . Drug use: No  . Sexual activity: Yes    Partners: Female  Lifestyle  . Physical activity:    Days per week: Not on file    Minutes per session: Not on file  . Stress: Not on file  Relationships  . Social connections:    Talks on phone: Not on file    Gets together: Not on file    Attends religious service: Not on file    Active member of club or organization: Not on file    Attends meetings of clubs or organizations: Not on file    Relationship status: Not on file  . Intimate partner violence:    Fear of current or ex partner: Not on file    Emotionally abused: Not on file    Physically abused: Not on file    Forced sexual activity: Not on file  Other Topics Concern  . Not on file  Social History Narrative   Tobacco Use - No.    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 [place] STUDY N/A 02/28/2016   Procedure: [place] STUDY;  Surgeon: Jerene Bears, MD;  Location: [facility];  Service: Gastroenterology;  Laterality: N/A;  . ESOPHAGEAL MANOMETRY N/A 09/01/2013   Procedure: ESOPHAGEAL MANOMETRY (EM);  Surgeon: Sable Feil, MD;  Location: [facility];  Service: Endoscopy;  Laterality: N/A;  . ESOPHAGEAL MANOMETRY N/A 02/28/2016   Procedure: ESOPHAGEAL MANOMETRY (EM);  Surgeon: Jerene Bears, MD;  Location: [facility];  Service: Gastroenterology;  Laterality: N/A;  . TONSILLECTOMY    . UVULECTOMY      Family History  Problem Relation Age of  Onset  . Dementia Mother   . Diabetes Mother   . Hypertension Mother   . Heart failure Mother   . Stroke Mother   . Obesity Mother   . Hypertension Unknown        siblings  . Diabetes Sister   . Diabetes Brother   . Heart attack Maternal Grandmother   . Diabetes Sister   . Pancreatic disease Sister   . Obesity Father   . Colon cancer Neg Hx     Allergies  Allergen Reactions  . Hydrocodone Itching    Current Outpatient Medications on File Prior to Visit  Medication Sig Dispense Refill  . acetaminophen (TYLENOL) 500 MG tablet Take 1 tablet (500 mg total) by mouth every 6 (six) hours as needed. 60 tablet 0  . aspirin EC 81 MG tablet Take 81 mg by mouth daily.    [name] atorvastatin (LIPITOR) 40 MG tablet Take 1 tablet (40 mg total) by mouth daily. 90 tablet 3  . BAYER MICROLET LANCETS lancets Test blood sugars twice daily 100 each 0  . Blood Glucose Monitoring Suppl (CONTOUR NEXT MONITOR) w/Device KIT 1 kit by Does not apply route 2 (two) times daily. 1 kit 0  . dicyclomine (BENTYL) 20 MG tablet Take 1 tablet (20 mg total) by mouth 3 (three) times daily as needed for spasms. 90 tablet 5  . esomeprazole (NEXIUM) 40 MG capsule Take 1 capsule (40 mg total) by mouth daily. 90 capsule 1  . fluticasone (FLONASE) 50 MCG/ACT nasal spray Place 2 sprays into both nostrils daily. 16 g 6  . glucose blood (CONTOUR NEXT TEST) test strip Test blood sugars twice daily 100 each 0  . losartan (COZAAR) 50 MG tablet TAKE ONE TABLET BY MOUTH ONCE DAILY 90 tablet 2  . metFORMIN (GLUCOPHAGE) 500 MG tablet Take 1 tablet (500 mg total) by mouth 2 (two) times daily with a meal. 60 tablet 0  . Multiple Vitamin (MULTIVITAMIN) tablet Take 1 tablet by mouth daily.    . NONFORMULARY OR COMPOUNDED ITEM     . ranitidine (ZANTAC) 300 MG tablet Take 1 tablet (300 mg total) by mouth at bedtime. (Patient taking differently: Take 300 mg by mouth at bedtime. Take every other day) 30 tablet 5  . tadalafil (CIALIS) 5 MG  tablet Take 1 tablet (5 mg total) by mouth daily as needed for erectile  dysfunction. 30 tablet 5  . triamterene-hydrochlorothiazide (MAXZIDE-25) 37.5-25 MG tablet Take 1 tablet by mouth daily. 90 tablet 1  . buPROPion (WELLBUTRIN SR) 150 MG 12 hr tablet Take 1 tablet (150 mg total) by mouth every morning. (Patient not taking: Reported on 10/12/2017) 30 tablet 0  . Vitamin D, Ergocalciferol, (DRISDOL) 50000 units CAPS capsule Take 1 capsule (50,000 Units total) by mouth every 7 (seven) days. (Patient not taking: Reported on 12/06/2017) 4 capsule 0   No current facility-administered medications on file prior to visit.     BP (!) 156/87 (Cuff Size: Large)   Pulse 70   Temp 98.1 F (36.7 C) (Oral)   Resp 16   Ht 6' (1.829 m)   Wt 259 lb 6.4 oz (117.7 kg)   SpO2 100%   BMI 35.18 kg/m       Objective:   Physical Exam  Constitutional: He is oriented to person, place, and time. He appears well-developed and well-nourished. No distress.  HENT:  Head: Normocephalic and atraumatic.  Right Ear: Tympanic membrane and ear canal normal.  Left Ear: Tympanic membrane and ear canal normal.  Mouth/Throat: No oropharyngeal exudate, posterior oropharyngeal edema or posterior oropharyngeal erythema.  Cardiovascular: Normal rate and regular rhythm.  No murmur heard. Pulmonary/Chest: Effort normal. No respiratory distress. He has wheezes in the left upper field and the left middle field. He has no rales.  Abdominal: There is no CVA tenderness.  Musculoskeletal: He exhibits no edema.  Lymphadenopathy:    He has no cervical adenopathy.  Neurological: He is alert and oriented to person, place, and time.  Skin: Skin is warm and dry.  Psychiatric: He has a normal mood and affect. His behavior is normal. Thought content normal.  GU: normal rectal exam, prostate is enlarged, smooth, non-tender        Assessment & Plan:  Viral bronchitis- rapid flu is negative. Will check cxr, add prn albuterol. Suspect  CP is pleuritic in nature. EKG tracing is personally reviewed.  EKG notes NSR.  No acute changes.   BPH- start cialis 58m once daily for BPH and ED.  UA clear. Check PSA.   Musculoskeletal low back pain- trial of meloxicam.  HTN- uncontrolled. Increase losartan from 591mto 10016m Follow up with PCP in 1 month.

## 2017-12-27 ENCOUNTER — Telehealth: Payer: Self-pay | Admitting: Family Medicine

## 2017-12-27 NOTE — Telephone Encounter (Signed)
Copied from [place]. Topic: Quick Communication - See Telephone Encounter >> Dec 27, 2017  9:59 AM Arletha Grippe wrote: CRM for notification. See Telephone encounter for: wife called, pt has stopped taking atorvastatin and losartan due to recalls.  Pt never got a letter stating the medication was on recall, just saw it on the news and wife works in medical office.  Pt has appt on 05/02, and will discuss it with provide then.  Cb is [phone]

## 2018-01-01 ENCOUNTER — Telehealth: Payer: Self-pay | Admitting: Family Medicine

## 2018-01-01 NOTE — Telephone Encounter (Signed)
Copied from [place] [id number]. Topic: Quick Communication - See Telephone Encounter >> Jan 01, 2018  2:18 PM Ether Griffins B wrote: CRM for notification. See Telephone encounter for: 01/01/18.  Pt would like to be checked for lung cancer. He is unsure if he would need a chest xray or a CT. Pt stated he would just like to check and make sure he doesn't have it.  Pt was also calling about recalls on two of his medications he is taking. He is going to contact the pharmacy to verify if the ones he is taking is part of the recall or not.

## 2018-01-04 NOTE — Telephone Encounter (Signed)
Please advise 

## 2018-01-04 NOTE — Telephone Encounter (Signed)
He is a non smoker so insurance will not pay for nor do we have any medical evidence that irradiating asymptomatic people dies not find disease and save lives enough to balance out the risk of radiation exposure. If he has symptoms he should let us know and come in for an evaluation so I will have my best chance of getting insurance to pay for a work up

## 2018-01-08 NOTE — Telephone Encounter (Signed)
Spoke with patient let him know about message below.  He states his 2 blood pressure medications lisinopril and losartan was on recall he did contact his pharmacy to see if his batch was that on recall they notified him and let him know his medication was not that on recall he stated he just wanted to make sure he did not have lung cancer

## 2018-01-10 ENCOUNTER — Ambulatory Visit (INDEPENDENT_AMBULATORY_CARE_PROVIDER_SITE_OTHER): Payer: Commercial Managed Care - PPO | Admitting: Family Medicine

## 2018-01-10 ENCOUNTER — Encounter: Payer: Self-pay | Admitting: Family Medicine

## 2018-01-10 VITALS — BP 137/68 | HR 78 | Temp 97.8°F | Resp 18 | Ht 72.05 in | Wt 256.6 lb

## 2018-01-10 DIAGNOSIS — E785 Hyperlipidemia, unspecified: Secondary | ICD-10-CM

## 2018-01-10 DIAGNOSIS — E559 Vitamin D deficiency, unspecified: Secondary | ICD-10-CM | POA: Diagnosis not present

## 2018-01-10 DIAGNOSIS — Z6831 Body mass index (BMI) 31.0-31.9, adult: Secondary | ICD-10-CM

## 2018-01-10 DIAGNOSIS — M25561 Pain in right knee: Secondary | ICD-10-CM

## 2018-01-10 DIAGNOSIS — K219 Gastro-esophageal reflux disease without esophagitis: Secondary | ICD-10-CM

## 2018-01-10 DIAGNOSIS — M25562 Pain in left knee: Secondary | ICD-10-CM | POA: Diagnosis not present

## 2018-01-10 DIAGNOSIS — R079 Chest pain, unspecified: Secondary | ICD-10-CM

## 2018-01-10 DIAGNOSIS — R0789 Other chest pain: Secondary | ICD-10-CM | POA: Diagnosis not present

## 2018-01-10 DIAGNOSIS — I10 Essential (primary) hypertension: Secondary | ICD-10-CM

## 2018-01-10 DIAGNOSIS — E119 Type 2 diabetes mellitus without complications: Secondary | ICD-10-CM

## 2018-01-10 DIAGNOSIS — E669 Obesity, unspecified: Secondary | ICD-10-CM

## 2018-01-10 DIAGNOSIS — E1169 Type 2 diabetes mellitus with other specified complication: Secondary | ICD-10-CM | POA: Diagnosis not present

## 2018-01-10 MED ORDER — OMEPRAZOLE 40 MG PO CPDR: 40.0000 mg | DELAYED_RELEASE_CAPSULE | Freq: Two times a day (BID) | ORAL | 1 refills | Status: DC

## 2018-01-10 MED ORDER — RANITIDINE HCL 300 MG PO TABS: 300.0000 mg | ORAL_TABLET | Freq: Every day | ORAL | 1 refills | Status: DC

## 2018-01-10 MED ORDER — ALBUTEROL SULFATE HFA 108 (90 BASE) MCG/ACT IN AERS
2.0000 | INHALATION_SPRAY | Freq: Four times a day (QID) | RESPIRATORY_TRACT | 0 refills | Status: DC | PRN
Start: 2018-01-10 — End: 2018-08-29

## 2018-01-10 MED ORDER — HYOSCYAMINE SULFATE 0.125 MG SL SUBL: 0.1250 mg | SUBLINGUAL_TABLET | SUBLINGUAL | 5 refills | Status: DC | PRN

## 2018-01-10 NOTE — Assessment & Plan Note (Signed)
hgba1c acceptable, minimize simple carbs. Increase exercise as tolerated.  

## 2018-01-10 NOTE — Assessment & Plan Note (Addendum)
Check level shows low. Labs reveal deficiency. Start on Vitamin D 50000 IU caps, 1 cap po weekly x 12 weeks. Disp #4 with 4 rf. Also take daily Vitamin D over the counter. If already taking a daily supplement increase by 1000 IU daily and if not start Vitamin D 2000 IU daily.

## 2018-01-10 NOTE — Patient Instructions (Signed)
Nonspecific Chest Pain °Chest pain can be caused by many different conditions. There is always a chance that your pain could be related to something serious, such as a heart attack or a blood clot in your lungs. Chest pain can also be caused by conditions that are not life-threatening. If you have chest pain, it is very important to follow up with your health care provider. °What are the causes? °Causes of this condition include: °· Heartburn. °· Pneumonia or bronchitis. °· Anxiety or stress. °· Inflammation around your heart (pericarditis) or lung (pleuritis or pleurisy). °· A blood clot in your lung. °· A collapsed lung (pneumothorax). This can develop suddenly on its own (spontaneous pneumothorax) or from trauma to the chest. °· Shingles infection (varicella-zoster virus). °· Heart attack. °· Damage to the bones, muscles, and cartilage that make up your chest wall. This can include: °? Bruised bones due to injury. °? Strained muscles or cartilage due to frequent or repeated coughing or overwork. °? Fracture to one or more ribs. °? Sore cartilage due to inflammation (costochondritis). ° °What increases the risk? °Risk factors for this condition may include: °· Activities that increase your risk for trauma or injury to your chest. °· Respiratory infections or conditions that cause frequent coughing. °· Medical conditions or overeating that can cause heartburn. °· Heart disease or family history of heart disease. °· Conditions or health behaviors that increase your risk of developing a blood clot. °· Having had chicken pox (varicella zoster). ° °What are the signs or symptoms? °Chest pain can feel like: °· Burning or tingling on the surface of your chest or deep in your chest. °· Crushing, pressure, aching, or squeezing pain. °· Dull or sharp pain that is worse when you move, cough, or take a deep breath. °· Pain that is also felt in your back, neck, shoulder, or arm, or pain that spreads to any of these  areas. ° °Your chest pain may come and go, or it may stay constant. °How is this diagnosed? °Lab tests or other studies may be needed to find the cause of your pain. Your health care provider may have you take a test called an ECG (electrocardiogram). An ECG records your heartbeat patterns at the time the test is performed. You may also have other tests, such as: °· Transthoracic echocardiogram (TTE). In this test, sound waves are used to create a picture of the heart structures and to look at how blood flows through your heart. °· Transesophageal echocardiogram (TEE). This is a more advanced imaging test that takes images from inside your body. It allows your health care provider to see your heart in finer detail. °· Cardiac monitoring. This allows your health care provider to monitor your heart rate and rhythm in real time. °· Holter monitor. This is a portable device that records your heartbeat and can help to diagnose abnormal heartbeats. It allows your health care provider to track your heart activity for several days, if needed. °· Stress tests. These can be done through exercise or by taking medicine that makes your heart beat more quickly. °· Blood tests. °· Other imaging tests. ° °How is this treated? °Treatment depends on what is causing your chest pain. Treatment may include: °· Medicines. These may include: °? Acid blockers for heartburn. °? Anti-inflammatory medicine. °? Pain medicine for inflammatory conditions. °? Antibiotic medicine, if an infection is present. °? Medicines to dissolve blood clots. °? Medicines to treat coronary artery disease (CAD). °· Supportive care for conditions that   do not require medicines. This may include: °? Resting. °? Applying heat or cold packs to injured areas. °? Limiting activities until pain decreases. ° °Follow these instructions at home: °Medicines °· If you were prescribed an antibiotic, take it as told by your health care provider. Do not stop taking the  antibiotic even if you start to feel better. °· Take over-the-counter and prescription medicines only as told by your health care provider. °Lifestyle °· Do not use any products that contain nicotine or tobacco, such as cigarettes and e-cigarettes. If you need help quitting, ask your health care provider. °· Do not drink alcohol. °· Make lifestyle changes as directed by your health care provider. These may include: °? Getting regular exercise. Ask your health care provider to suggest some activities that are safe for you. °? Eating a heart-healthy diet. A registered dietitian can help you to learn healthy eating options. °? Maintaining a healthy weight. °? Managing diabetes, if necessary. °? Reducing stress, such as with yoga or relaxation techniques. °General instructions °· Avoid any activities that bring on chest pain. °· If heartburn is the cause for your chest pain, raise (elevate) the head of your bed about 6 inches (15 cm) by putting blocks under the legs. Sleeping with more pillows does not effectively relieve heartburn because it only changes the position of your head. °· Keep all follow-up visits as told by your health care provider. This is important. This includes any further testing if your chest pain does not go away. °Contact a health care provider if: °· Your chest pain does not go away. °· You have a rash with blisters on your chest. °· You have a fever. °· You have chills. °Get help right away if: °· Your chest pain is worse. °· You have a cough that gets worse, or you cough up blood. °· You have severe pain in your abdomen. °· You have severe weakness. °· You faint. °· You have sudden, unexplained chest discomfort. °· You have sudden, unexplained discomfort in your arms, back, neck, or jaw. °· You have shortness of breath at any time. °· You suddenly start to sweat, or your skin gets clammy. °· You feel nauseous or you vomit. °· You suddenly feel light-headed or dizzy. °· Your heart begins to beat  quickly, or it feels like it is skipping beats. °These symptoms may represent a serious problem that is an emergency. Do not wait to see if the symptoms will go away. Get medical help right away. Call your local emergency services (911 in the [place] Do not drive yourself to the hospital. °This information is not intended to replace advice given to you by your health care provider. Make sure you discuss any questions you have with your health care provider. °Document Released: 06/07/2005 Document Revised: 05/22/2016 Document Reviewed: 05/22/2016 °Elsevier Interactive Patient Education © 2017 [employer] ° °

## 2018-01-10 NOTE — Assessment & Plan Note (Signed)
Well controlled, no changes to meds. Encouraged heart healthy diet such as the DASH diet and exercise as tolerated.  °

## 2018-01-10 NOTE — Assessment & Plan Note (Addendum)
Continues to struggle with episodes of atypical chest pressure often occurring at night, sometimes associated with a  Cough but not always and not other associated. Will proceed with CT chest, labs show normal CRP but elevated sed rate, would repeat and add ANA and RF

## 2018-01-10 NOTE — Progress Notes (Signed)
Subjective:  I acted as a Education administrator for [employer]. [name] Foster, [place]   Patient ID: [name] Foster, male    DOB: September 14, 1965, 52 y.o.   MRN: [id number]  Chief Complaint  Patient presents with  . Follow-up    HPI  Patient is in today for follow up visit and he continues to struggle with intermittent atypical chest pain. Often occurs after eating and is a grabbing pain but this is not always the cause. No associated SOB, diaphoresis or vomtiting. Is frustrated with recurrent symptoms. He notes Hyoscyamine helped in the past. Is down to Omeprazole once daily and no Ranitidine. Denies palp/SOB/HA/congestion/fevers or GU c/o. Taking meds as prescribed  Patient Care Team: [name], MD as PCP - General (Family Medicine) Sable Feil, MD as Referring Physician (Gastroenterology)   Past Medical History:  Diagnosis Date  . Bone spur    left foot  . Chest pain   . Diabetes mellitus type 2 in obese ([place]) 07/13/2013  . Diverticulosis   . Erectile dysfunction 01/15/2013  . Esophageal reflux 01/15/2013  . Fatty liver 07/04/2015  . GERD (gastroesophageal reflux disease)   . Hiatal hernia   . Hiatal hernia with gastroesophageal reflux 03/20/2014  . Hyperglycemia 07/13/2013  . Hyperplastic colon polyp   . Hypertension   . Internal hemorrhoids   . Mixed hyperlipidemia   . Murmur   . OSA (obstructive sleep apnea)    s/p UPPP  . Plantar fasciitis   . Prediabetes   . Reflux esophagitis   . Stomach ulcer    from PCP  . Urinary hesitancy 09/30/2015    Past Surgical History:  Procedure Laterality Date  . [place] STUDY N/A 02/28/2016   Procedure: [place] STUDY;  Surgeon: Jerene Bears, MD;  Location: [facility];  Service: Gastroenterology;  Laterality: N/A;  . ESOPHAGEAL MANOMETRY N/A 09/01/2013   Procedure: ESOPHAGEAL MANOMETRY (EM);  Surgeon: Sable Feil, MD;  Location: [facility];  Service: Endoscopy;  Laterality: N/A;  . ESOPHAGEAL MANOMETRY N/A 02/28/2016   Procedure:  ESOPHAGEAL MANOMETRY (EM);  Surgeon: Jerene Bears, MD;  Location: [facility];  Service: Gastroenterology;  Laterality: N/A;  . TONSILLECTOMY    . UVULECTOMY      Family History  Problem Relation Age of Onset  . Dementia Mother   . Diabetes Mother   . Hypertension Mother   . Heart failure Mother   . Stroke Mother   . Obesity Mother   . Hypertension Unknown        siblings  . Diabetes Sister   . Diabetes Brother   . Heart attack Maternal Grandmother   . Diabetes Sister   . Pancreatic disease Sister   . Obesity Father   . Colon cancer Neg Hx     Social History   Socioeconomic History  . Marital status: Married    Spouse name: [name]  . Number of children: Not on file  . Years of education: Not on file  . Highest education level: Not on file  Occupational History  . Occupation: Nature conservation officer: [employer]  . Financial resource strain: Not on file  . Food insecurity:    Worry: Not on file    Inability: Not on file  . Transportation needs:    Medical: Not on file    Non-medical: Not on file  Tobacco Use  . Smoking status: Never Smoker  . Smokeless tobacco: Never Used  Substance  and Sexual Activity  . Alcohol use: No  . Drug use: No  . Sexual activity: Yes    Partners: Female  Lifestyle  . Physical activity:    Days per week: Not on file    Minutes per session: Not on file  . Stress: Not on file  Relationships  . Social connections:    Talks on phone: Not on file    Gets together: Not on file    Attends religious service: Not on file    Active member of club or organization: Not on file    Attends meetings of clubs or organizations: Not on file    Relationship status: Not on file  . Intimate partner violence:    Fear of current or ex partner: Not on file    Emotionally abused: Not on file    Physically abused: Not on file    Forced sexual activity: Not on file  Other Topics Concern  . Not on file  Social History  Narrative   Tobacco Use - No.    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 acetaminophen (TYLENOL) 500 MG tablet Take 1 tablet (500 mg total) by mouth every 6 (six) hours as needed. 60 tablet 0  . albuterol (PROVENTIL HFA;VENTOLIN HFA) 108 (90 Base) MCG/ACT inhaler Inhale 2 puffs into the lungs every 6 (six) hours as needed for wheezing or shortness of breath. 1 Inhaler 0  . aspirin EC 81 MG tablet Take 81 mg by mouth daily.    [name] atorvastatin (LIPITOR) 40 MG tablet Take 1 tablet (40 mg total) by mouth daily. 90 tablet 3  . BAYER MICROLET LANCETS lancets Test blood sugars twice daily 100 each 0  . Blood Glucose Monitoring Suppl (CONTOUR NEXT MONITOR) w/Device KIT 1 kit by Does not apply route 2 (two) times daily. 1 kit 0  . dicyclomine (BENTYL) 20 MG tablet Take 1 tablet (20 mg total) by mouth 3 (three) times daily as needed for spasms. 90 tablet 5  . esomeprazole (NEXIUM) 40 MG capsule Take 1 capsule (40 mg total) by mouth daily. 90 capsule 1  . fluticasone (FLONASE) 50 MCG/ACT nasal spray Place 2 sprays into both nostrils daily. 16 g 6  . glucose blood (CONTOUR NEXT TEST) test strip Test blood sugars twice daily 100 each 0  . losartan (COZAAR) 100 MG tablet Take 1 tablet (100 mg total) by mouth daily. 90 tablet 3  . meloxicam (MOBIC) 7.5 MG tablet Take 1 tablet (7.5 mg total) by mouth daily. 14 tablet 0  . metFORMIN (GLUCOPHAGE) 500 MG tablet Take 1 tablet (500 mg total) by mouth 2 (two) times daily with a meal. 60 tablet 0  . Multiple Vitamin (MULTIVITAMIN) tablet Take 1 tablet by mouth daily.    . NONFORMULARY OR COMPOUNDED ITEM     . tadalafil (CIALIS) 5  MG tablet Take 1 tablet (5 mg total) by mouth daily as needed for erectile dysfunction. 30 tablet 5  . triamterene-hydrochlorothiazide (MAXZIDE-25) 37.5-25 MG tablet  Take 1 tablet by mouth daily. 90 tablet 1  . Vitamin D, Ergocalciferol, (DRISDOL) 50000 units CAPS capsule Take 1 capsule (50,000 Units total) by mouth every 7 (seven) days. 4 capsule 0  . omeprazole (PRILOSEC) 40 MG capsule Take 1 capsule (40 mg total) by mouth daily. 90 capsule 1  . ranitidine (ZANTAC) 300 MG tablet Take 1 tablet (300 mg total) by mouth at bedtime. (Patient taking differently: Take 300 mg by mouth at bedtime. Take every other day) 30 tablet 5   No facility-administered medications prior to visit.     Allergies  Allergen Reactions  . Hydrocodone Itching    Review of Systems  Constitutional: Positive for malaise/fatigue. Negative for fever.  HENT: Negative for congestion.   Eyes: Negative for blurred vision.  Respiratory: Positive for cough. Negative for shortness of breath.   Cardiovascular: Positive for chest pain. Negative for palpitations and leg swelling.  Gastrointestinal: Positive for heartburn. Negative for abdominal pain, blood in stool, nausea and vomiting.  Genitourinary: Negative for dysuria and frequency.  Musculoskeletal: Negative for falls.  Skin: Negative for rash.  Neurological: Negative for dizziness, loss of consciousness and headaches.  Endo/Heme/Allergies: Negative for environmental allergies.  Psychiatric/Behavioral: Negative for depression. The patient is nervous/anxious.        Objective:    Physical Exam  Constitutional: He is oriented to person, place, and time. No distress.  HENT:  Head: Normocephalic and atraumatic.  Oropharynx erythematous  Eyes: Conjunctivae are normal.  Neck: Neck supple. No thyromegaly present.  Cardiovascular: Normal rate, regular rhythm and normal heart sounds.  No murmur heard. Pulmonary/Chest: Effort normal and breath sounds normal. No  respiratory distress.  Abdominal: He exhibits no distension and no mass. There is no tenderness.  Musculoskeletal: He exhibits no edema.  Neurological: He is alert and oriented to person, place, and time.  Skin: Skin is warm.  Psychiatric: Judgment normal.    BP 137/68 (BP Location: Left Arm, Patient Position: Sitting, Cuff Size: Large)   Pulse 78   Temp 97.8 F (36.6 C) (Oral)   Resp 18   Ht 6' 0.05" (1.83 m)   Wt 256 lb 9.6 oz (116.4 kg)   SpO2 97%   BMI 34.76 kg/m  Wt Readings from Last 3 Encounters:  01/10/18 256 lb 9.6 oz (116.4 kg)  12/06/17 259 lb 6.4 oz (117.7 kg)  10/12/17 256 lb (116.1 kg)   BP Readings from Last 3 Encounters:  01/10/18 137/68  12/06/17 (!) 156/87  10/12/17 140/90     Immunization History  Administered Date(s) Administered  . Influenza Whole 09/15/2011  . Influenza,inj,Quad PF,6+ Mos 06/26/2014, 09/30/2015  . Td 01/01/2007  . Tdap 06/26/2014    Health Maintenance  Topic Date Due  . PNEUMOCOCCAL POLYSACCHARIDE VACCINE (1) 10/24/1969  . FOOT EXAM  10/24/1977  . OPHTHALMOLOGY EXAM  10/24/1977  . INFLUENZA VACCINE  04/11/2018  . HEMOGLOBIN A1C  07/13/2018  . TETANUS/TDAP  06/26/2024  . COLONOSCOPY  01/30/2026  . HIV Screening  Completed    Lab Results  Component Value Date   WBC 4.7 01/10/2018   HGB 16.1 01/10/2018   HCT 47.5 01/10/2018   PLT 196.0 01/10/2018   GLUCOSE 149 (H) 01/10/2018   CHOL 235 (H) 01/10/2018   TRIG 230.0 (H) 01/10/2018   HDL 43.50 01/10/2018   LDLDIRECT 162.0 01/10/2018   LDLCALC 112 (H) 11/27/2016   ALT 38 01/10/2018   AST 25 01/10/2018   NA 137 01/10/2018   K 3.8 01/10/2018   CL  103 01/10/2018   CREATININE 1.12 01/10/2018   BUN 18 01/10/2018   CO2 22 01/10/2018   TSH 1.76 01/10/2018   PSA 2.39 09/21/2016   HGBA1C 6.1 01/10/2018    Lab Results  Component Value Date   TSH 1.76 01/10/2018   Lab Results  Component Value Date   WBC 4.7 01/10/2018   HGB 16.1 01/10/2018   HCT 47.5 01/10/2018    MCV 86.5 01/10/2018   PLT 196.0 01/10/2018   Lab Results  Component Value Date   NA 137 01/10/2018   K 3.8 01/10/2018   CO2 22 01/10/2018   GLUCOSE 149 (H) 01/10/2018   BUN 18 01/10/2018   CREATININE 1.12 01/10/2018   BILITOT 0.3 01/10/2018   ALKPHOS 80 01/10/2018   AST 25 01/10/2018   ALT 38 01/10/2018   PROT 7.7 01/10/2018   ALBUMIN 4.5 01/10/2018   CALCIUM 10.0 01/10/2018   GFR 89.17 01/10/2018   Lab Results  Component Value Date   CHOL 235 (H) 01/10/2018   Lab Results  Component Value Date   HDL 43.50 01/10/2018   Lab Results  Component Value Date   LDLCALC 112 (H) 11/27/2016   Lab Results  Component Value Date   TRIG 230.0 (H) 01/10/2018   Lab Results  Component Value Date   CHOLHDL 5 01/10/2018   Lab Results  Component Value Date   HGBA1C 6.1 01/10/2018         Assessment & Plan:   Problem List Items Addressed This Visit    Hyperlipidemia    Encouraged heart healthy diet, increase exercise, avoid trans fats, consider a krill oil cap daily. Start Atorvastatin      Essential hypertension    Well controlled, no changes to meds. Encouraged heart healthy diet such as the DASH diet and exercise as tolerated.       Relevant Orders   CBC with Differential/Platelet (Completed)   Comprehensive metabolic panel (Completed)   TSH (Completed)   Esophageal reflux     Is only on Omeprazole 40 mg daily. Increase to BID and restart Ranitidine 300 mg po daily.Hyoscyamine again prn      Relevant Medications   ranitidine (ZANTAC) 300 MG tablet   omeprazole (PRILOSEC) 40 MG capsule   hyoscyamine (LEVSIN SL) 0.125 MG SL tablet   Obesity    Reports he had a good 30# weight loss with the bariatric program but then stopped going and weight has increased significantly. He is going to return and he is encouraged to increase activity and try the mind or dash diet.       Diabetes mellitus type 2 in obese ([name])    hgba1c acceptable, minimize simple carbs. Increase  exercise as tolerated.       Relevant Orders   Hemoglobin A1c (Completed)   Atypical chest pain    Continues to struggle with episodes of atypical chest pressure often occurring at night, sometimes associated with a  Cough but not always and not other associated. Will proceed with CT chest, labs show normal CRP but elevated sed rate, would repeat and add ANA and RF       Relevant Orders   TSH (Completed)   Sedimentation rate (Completed)   C-reactive protein (Completed)   CT Chest W Contrast   Gastroesophageal reflux disease without esophagitis    Throat red and he continues to have symptoms suggestive of esophageal spasm, hyoscyamine helped in past, restarted and start back on ranitidine at qhs and increase Omeprazole to bid. Avoid  offending foods      Relevant Medications   ranitidine (ZANTAC) 300 MG tablet   omeprazole (PRILOSEC) 40 MG capsule   hyoscyamine (LEVSIN SL) 0.125 MG SL tablet   Vitamin D deficiency    Check level shows low. Labs reveal deficiency. Start on Vitamin D 50000 IU caps, 1 cap po weekly x 12 weeks. Disp #4 with 4 rf. Also take daily Vitamin D over the counter. If already taking a daily supplement increase by 1000 IU daily and if not start Vitamin D 2000 IU daily.       Relevant Orders   VITAMIN D 25 Hydroxy (Vit-D Deficiency, Fractures) (Completed)   Type 2 diabetes mellitus without complication, without long-term current use of insulin ([name])    hgba1c acceptable, minimize simple carbs. Increase exercise as tolerated. Continue current meds       Other Visit Diagnoses    Pain in both knees, unspecified chronicity    -  Primary   Relevant Orders   Ambulatory referral to Sports Medicine   Chest pain, unspecified type       Relevant Orders   Lipid panel (Completed)   CT Chest W Contrast   Other chest pain       Relevant Orders   CT Chest W Contrast   Ambulatory referral to Sports Medicine      I have changed Suresh E. Bettenhausen's omeprazole. I am also  having him start on albuterol and hyoscyamine. Additionally, I am having him maintain his aspirin EC, multivitamin, NONFORMULARY OR COMPOUNDED ITEM, acetaminophen, atorvastatin, dicyclomine, CONTOUR NEXT MONITOR, Vitamin D (Ergocalciferol), metFORMIN, triamterene-hydrochlorothiazide, glucose blood, BAYER MICROLET LANCETS, fluticasone, esomeprazole, albuterol, meloxicam, tadalafil, losartan, and ranitidine.  Meds ordered this encounter  Medications  . albuterol (PROVENTIL HFA;VENTOLIN HFA) 108 (90 Base) MCG/ACT inhaler    Sig: Inhale 2 puffs into the lungs every 6 (six) hours as needed for wheezing or shortness of breath.    Dispense:  1 Inhaler    Refill:  0  . ranitidine (ZANTAC) 300 MG tablet    Sig: Take 1 tablet (300 mg total) by mouth at bedtime.    Dispense:  90 tablet    Refill:  1  . omeprazole (PRILOSEC) 40 MG capsule    Sig: Take 1 capsule (40 mg total) by mouth 2 (two) times daily.    Dispense:  180 capsule    Refill:  1  . hyoscyamine (LEVSIN SL) 0.125 MG SL tablet    Sig: Place 1 tablet (0.125 mg total) under the tongue every 4 (four) hours as needed.    Dispense:  30 tablet    Refill:  5    CMA served as scribe during this visit. History, Physical and Plan performed by medical provider. Documentation and orders reviewed and attested to.  Penni Homans, MD

## 2018-01-10 NOTE — Assessment & Plan Note (Signed)
Is only on Omeprazole 40 mg daily. Increase to BID and restart Ranitidine 300 mg po daily.Hyoscyamine again prn

## 2018-01-11 LAB — TSH: TSH: 1.76 u[IU]/mL (ref 0.35–4.50)

## 2018-01-11 LAB — COMPREHENSIVE METABOLIC PANEL
ALT: 38 U/L (ref 0–53)
AST: 25 U/L (ref 0–37)
Albumin: 4.5 g/dL (ref 3.5–5.2)
Alkaline Phosphatase: 80 U/L (ref 39–117)
BILIRUBIN TOTAL: 0.3 mg/dL (ref 0.2–1.2)
BUN: 18 mg/dL (ref 6–23)
CO2: 22 meq/L (ref 19–32)
CREATININE: 1.12 mg/dL (ref 0.40–1.50)
Calcium: 10 mg/dL (ref 8.4–10.5)
Chloride: 103 mEq/L (ref 96–112)
GFR: 89.17 mL/min (ref >60.00)
GLUCOSE: 149 mg/dL — AB (ref 70–99)
Potassium: 3.8 mEq/L (ref 3.5–5.1)
Sodium: 137 mEq/L (ref 135–145)
Total Protein: 7.7 g/dL (ref 6.0–8.3)

## 2018-01-11 LAB — LIPID PANEL
Cholesterol: 235 mg/dL — ABNORMAL HIGH (ref 0–200)
HDL: 43.5 mg/dL (ref >39.00)
NONHDL: 191.01
Total CHOL/HDL Ratio: 5
Triglycerides: 230 mg/dL — ABNORMAL HIGH (ref 0.0–149.0)
VLDL: 46 mg/dL — ABNORMAL HIGH (ref 0.0–40.0)

## 2018-01-11 LAB — CBC WITH DIFFERENTIAL/PLATELET
BASOS ABS: 0 10*3/uL (ref 0.0–0.1)
Basophils Relative: 0.7 % (ref 0.0–3.0)
EOS ABS: 0.1 10*3/uL (ref 0.0–0.7)
Eosinophils Relative: 2.1 % (ref 0.0–5.0)
HEMATOCRIT: 47.5 % (ref 39.0–52.0)
Hemoglobin: 16.1 g/dL (ref 13.0–17.0)
LYMPHS ABS: 1.8 10*3/uL (ref 0.7–4.0)
LYMPHS PCT: 37.5 % (ref 12.0–46.0)
MCHC: 33.9 g/dL (ref 30.0–36.0)
MCV: 86.5 fl (ref 78.0–100.0)
Monocytes Absolute: 0.3 10*3/uL (ref 0.1–1.0)
Monocytes Relative: 7.2 % (ref 3.0–12.0)
NEUTROS ABS: 2.5 10*3/uL (ref 1.4–7.7)
NEUTROS PCT: 52.5 % (ref 43.0–77.0)
Platelets: 196 10*3/uL (ref 150.0–400.0)
RBC: 5.5 Mil/uL (ref 4.22–5.81)
RDW: 14 % (ref 11.5–15.5)
WBC: 4.7 10*3/uL (ref 4.0–10.5)

## 2018-01-11 LAB — C-REACTIVE PROTEIN: CRP: 0.4 mg/dL — AB (ref 0.5–20.0)

## 2018-01-11 LAB — HEMOGLOBIN A1C: HEMOGLOBIN A1C: 6.1 % (ref 4.6–6.5)

## 2018-01-11 LAB — SEDIMENTATION RATE: Sed Rate: 27 mm/hr — ABNORMAL HIGH (ref 0–20)

## 2018-01-11 LAB — LDL CHOLESTEROL, DIRECT: Direct LDL: 162 mg/dL

## 2018-01-11 LAB — VITAMIN D 25 HYDROXY (VIT D DEFICIENCY, FRACTURES): VITD: 19.4 ng/mL — ABNORMAL LOW (ref 30.00–100.00)

## 2018-01-13 NOTE — Assessment & Plan Note (Signed)
Reports he had a good 30# weight loss with the bariatric program but then stopped going and weight has increased significantly. He is going to return and he is encouraged to increase activity and try the mind or dash diet.

## 2018-01-13 NOTE — Assessment & Plan Note (Signed)
Throat red and he continues to have symptoms suggestive of esophageal spasm, hyoscyamine helped in past, restarted and start back on ranitidine at qhs and increase Omeprazole to bid. Avoid offending foods

## 2018-01-13 NOTE — Assessment & Plan Note (Signed)
hgba1c acceptable, minimize simple carbs. Increase exercise as tolerated. Continue current meds 

## 2018-01-13 NOTE — Assessment & Plan Note (Signed)
Encouraged heart healthy diet, increase exercise, avoid trans fats, consider a krill oil cap daily. Start Atorvastatin 

## 2018-01-16 ENCOUNTER — Other Ambulatory Visit: Payer: Self-pay

## 2018-01-16 DIAGNOSIS — E559 Vitamin D deficiency, unspecified: Secondary | ICD-10-CM

## 2018-01-16 MED ORDER — ATORVASTATIN CALCIUM 10 MG PO TABS: 10.0000 mg | ORAL_TABLET | Freq: Every day | ORAL | 3 refills | Status: DC

## 2018-01-16 MED ORDER — VITAMIN D (ERGOCALCIFEROL) 1.25 MG (50000 UNIT) PO CAPS: 50000.0000 [IU] | ORAL_CAPSULE | ORAL | 4 refills | Status: DC

## 2018-01-29 ENCOUNTER — Encounter (INDEPENDENT_AMBULATORY_CARE_PROVIDER_SITE_OTHER): Payer: Self-pay

## 2018-03-21 ENCOUNTER — Encounter: Payer: Commercial Managed Care - PPO | Admitting: Family Medicine

## 2018-03-21 DIAGNOSIS — M79671 Pain in right foot: Secondary | ICD-10-CM | POA: Diagnosis not present

## 2018-03-21 DIAGNOSIS — M6289 Other specified disorders of muscle: Secondary | ICD-10-CM | POA: Diagnosis not present

## 2018-03-21 DIAGNOSIS — M79673 Pain in unspecified foot: Secondary | ICD-10-CM | POA: Diagnosis not present

## 2018-06-11 ENCOUNTER — Ambulatory Visit (INDEPENDENT_AMBULATORY_CARE_PROVIDER_SITE_OTHER): Payer: Commercial Managed Care - PPO | Admitting: Family Medicine

## 2018-06-11 ENCOUNTER — Encounter: Payer: Self-pay | Admitting: Family Medicine

## 2018-06-11 DIAGNOSIS — K219 Gastro-esophageal reflux disease without esophagitis: Secondary | ICD-10-CM

## 2018-06-11 DIAGNOSIS — Z6831 Body mass index (BMI) 31.0-31.9, adult: Secondary | ICD-10-CM

## 2018-06-11 DIAGNOSIS — Z23 Encounter for immunization: Secondary | ICD-10-CM

## 2018-06-11 DIAGNOSIS — E559 Vitamin D deficiency, unspecified: Secondary | ICD-10-CM

## 2018-06-11 DIAGNOSIS — R0600 Dyspnea, unspecified: Secondary | ICD-10-CM

## 2018-06-11 DIAGNOSIS — E785 Hyperlipidemia, unspecified: Secondary | ICD-10-CM | POA: Diagnosis not present

## 2018-06-11 DIAGNOSIS — K449 Diaphragmatic hernia without obstruction or gangrene: Secondary | ICD-10-CM

## 2018-06-11 DIAGNOSIS — Z0000 Encounter for general adult medical examination without abnormal findings: Secondary | ICD-10-CM

## 2018-06-11 DIAGNOSIS — E1169 Type 2 diabetes mellitus with other specified complication: Secondary | ICD-10-CM

## 2018-06-11 DIAGNOSIS — G4730 Sleep apnea, unspecified: Secondary | ICD-10-CM

## 2018-06-11 DIAGNOSIS — E669 Obesity, unspecified: Secondary | ICD-10-CM

## 2018-06-11 DIAGNOSIS — M79672 Pain in left foot: Secondary | ICD-10-CM

## 2018-06-11 DIAGNOSIS — J069 Acute upper respiratory infection, unspecified: Secondary | ICD-10-CM

## 2018-06-11 DIAGNOSIS — M79671 Pain in right foot: Secondary | ICD-10-CM

## 2018-06-11 DIAGNOSIS — I10 Essential (primary) hypertension: Secondary | ICD-10-CM

## 2018-06-11 DIAGNOSIS — R072 Precordial pain: Secondary | ICD-10-CM

## 2018-06-11 MED ORDER — OMEPRAZOLE 40 MG PO CPDR: 40.0000 mg | DELAYED_RELEASE_CAPSULE | Freq: Two times a day (BID) | ORAL | 1 refills | Status: DC

## 2018-06-11 MED ORDER — ATORVASTATIN CALCIUM 10 MG PO TABS: 10.0000 mg | ORAL_TABLET | Freq: Every day | ORAL | 3 refills | Status: DC

## 2018-06-11 MED ORDER — FLUTICASONE PROPIONATE 50 MCG/ACT NA SUSP: 2.0000 | Freq: Every day | NASAL | 6 refills | Status: DC

## 2018-06-11 MED ORDER — IBUPROFEN 600 MG PO TABS: 600.0000 mg | ORAL_TABLET | Freq: Three times a day (TID) | ORAL | 1 refills | Status: DC | PRN

## 2018-06-11 MED ORDER — HYOSCYAMINE SULFATE 0.125 MG SL SUBL: 0.1250 mg | SUBLINGUAL_TABLET | SUBLINGUAL | 5 refills | Status: DC | PRN

## 2018-06-11 NOTE — Assessment & Plan Note (Signed)
Patient encouraged to maintain heart healthy diet, regular exercise, adequate sleep. Consider daily probiotics. Take medications as prescribed 

## 2018-06-11 NOTE — Assessment & Plan Note (Signed)
Supplement and monitor 

## 2018-06-11 NOTE — Patient Instructions (Addendum)
Try the muscle relaxer called Tizanidine at bedtime and the Tylenol/Acetaminophen XR 500 mg tabs 1 tab twice daily  Shingrix is the new shingles shot, 2 shots over 2-6 months check with insurance regarding coverage then call for back here to get appointment Preventive Care 40-64 Years, Male Preventive care refers to lifestyle choices and visits with your health care provider that can promote health and wellness. What does preventive care include?  A yearly physical exam. This is also called an annual well check.  Dental exams once or twice a year.  Routine eye exams. Ask your health care provider how often you should have your eyes checked.  Personal lifestyle choices, including: ? Daily care of your teeth and gums. ? Regular physical activity. ? Eating a healthy diet. ? Avoiding tobacco and drug use. ? Limiting alcohol use. ? Practicing safe sex. ? Taking low-dose aspirin every day starting at age 76. What happens during an annual well check? The services and screenings done by your health care provider during your annual well check will depend on your age, overall health, lifestyle risk factors, and family history of disease. Counseling Your health care provider may ask you questions about your:  Alcohol use.  Tobacco use.  Drug use.  Emotional well-being.  Home and relationship well-being.  Sexual activity.  Eating habits.  Work and work Statistician.  Screening You may have the following tests or measurements:  Height, weight, and BMI.  Blood pressure.  Lipid and cholesterol levels. These may be checked every 5 years, or more frequently if you are over 73 years old.  Skin check.  Lung cancer screening. You may have this screening every year starting at age 66 if you have a 30-pack-year history of smoking and currently smoke or have quit within the past 15 years.  Fecal occult blood test (FOBT) of the stool. You may have this test every year starting at age  86.  Flexible sigmoidoscopy or colonoscopy. You may have a sigmoidoscopy every 5 years or a colonoscopy every 10 years starting at age 80.  Prostate cancer screening. Recommendations will vary depending on your family history and other risks.  Hepatitis C blood test.  Hepatitis B blood test.  Sexually transmitted disease (STD) testing.  Diabetes screening. This is done by checking your blood sugar (glucose) after you have not eaten for a while (fasting). You may have this done every 1-3 years.  Discuss your test results, treatment options, and if necessary, the need for more tests with your health care provider. Vaccines Your health care provider may recommend certain vaccines, such as:  Influenza vaccine. This is recommended every year.  Tetanus, diphtheria, and acellular pertussis (Tdap, Td) vaccine. You may need a Td booster every 10 years.  Varicella vaccine. You may need this if you have not been vaccinated.  Zoster vaccine. You may need this after age 75.  Measles, mumps, and rubella (MMR) vaccine. You may need at least one dose of MMR if you were born in 1957 or later. You may also need a second dose.  Pneumococcal 13-valent conjugate (PCV13) vaccine. You may need this if you have certain conditions and have not been vaccinated.  Pneumococcal polysaccharide (PPSV23) vaccine. You may need one or two doses if you smoke cigarettes or if you have certain conditions.  Meningococcal vaccine. You may need this if you have certain conditions.  Hepatitis A vaccine. You may need this if you have certain conditions or if you travel or work in places  where you may be exposed to hepatitis A.  Hepatitis B vaccine. You may need this if you have certain conditions or if you travel or work in places where you may be exposed to hepatitis B.  Haemophilus influenzae type b (Hib) vaccine. You may need this if you have certain risk factors.  Talk to your health care provider about which  screenings and vaccines you need and how often you need them. This information is not intended to replace advice given to you by your health care provider. Make sure you discuss any questions you have with your health care provider. Document Released: 09/24/2015 Document Revised: 05/17/2016 Document Reviewed: 06/29/2015 Elsevier Interactive Patient Education  [employer].

## 2018-06-11 NOTE — Assessment & Plan Note (Signed)
Encouraged DASH diet, decrease po intake and increase exercise as tolerated. Needs 7-8 hours of sleep nightly. Avoid trans fats, eat small, frequent meals every 4-5 hours with lean proteins, complex carbs and healthy fats. Minimize simple carbs, was at healthy weight and wellness but stopped going he is back on weight loss

## 2018-06-11 NOTE — Assessment & Plan Note (Signed)
Referred back to podiatry

## 2018-06-11 NOTE — Assessment & Plan Note (Signed)
Persistent chest pain x 3 years. Fluctuates in intensity but is present the vast majority of the time

## 2018-06-11 NOTE — Assessment & Plan Note (Addendum)
Notes some discomfort when he takes deep breath a deep. Will proceed with echo and he agees to seek care if symptoms worsen

## 2018-06-11 NOTE — Assessment & Plan Note (Signed)
Stopped CPAP when he lost weight and felt better now worsening sleep restlessness, wakes up choking and fatigue referred back pulmonology at [facility]

## 2018-06-11 NOTE — Assessment & Plan Note (Signed)
hgba1c acceptable, minimize simple carbs. Increase exercise as tolerated. Continue current meds 

## 2018-06-11 NOTE — Assessment & Plan Note (Signed)
Avoid offending foods, start probiotics. Do not eat large meals in late evening and consider raising head of bed.  

## 2018-06-11 NOTE — Assessment & Plan Note (Signed)
Elevate feet, minimize sodium try compression hose and check echo

## 2018-06-11 NOTE — Assessment & Plan Note (Signed)
Tolerating statin, encouraged heart healthy diet, avoid trans fats, minimize simple carbs and saturated fats. Increase exercise as tolerated 

## 2018-06-16 NOTE — Assessment & Plan Note (Signed)
Avoid offending foods, start probiotics. Do not eat large meals in late evening and consider raising head of bed. Occasional abdominal cramping. Hyoscyamine prn

## 2018-06-16 NOTE — Progress Notes (Signed)
Subjective:    Patient ID: [name] Foster, male    DOB: Dec 22, 1965, 52 y.o.   MRN: [id number]  No chief complaint on file.   HPI Patient is in today for follow up. He is struggling with increased heel pain again. It is bilateral and he is requesting a referral back to podiatry for management. No trauma or injury. No polyuria or polydipsia. Notes worsening shortness of breath with exertion and chest discomfort with deep breathing as well. Also notes some head congestion, no fevers or chills. Denies palpHA/fevers/GI or GU c/o. Taking meds as prescribed  Past Medical History:  Diagnosis Date  . Bone spur    left foot  . Chest pain   . Diabetes mellitus type 2 in obese ([place]) 07/13/2013  . Diverticulosis   . Erectile dysfunction 01/15/2013  . Esophageal reflux 01/15/2013  . Fatty liver 07/04/2015  . GERD (gastroesophageal reflux disease)   . Hiatal hernia   . Hiatal hernia with gastroesophageal reflux 03/20/2014  . Hyperglycemia 07/13/2013  . Hyperplastic colon polyp   . Hypertension   . Internal hemorrhoids   . Mixed hyperlipidemia   . Murmur   . OSA (obstructive sleep apnea)    s/p UPPP  . Plantar fasciitis   . Prediabetes   . Reflux esophagitis   . Stomach ulcer    from PCP  . Urinary hesitancy 09/30/2015    Past Surgical History:  Procedure Laterality Date  . [place] STUDY N/A 02/28/2016   Procedure: [place] STUDY;  Surgeon: Jerene Bears, MD;  Location: [facility];  Service: Gastroenterology;  Laterality: N/A;  . ESOPHAGEAL MANOMETRY N/A 09/01/2013   Procedure: ESOPHAGEAL MANOMETRY (EM);  Surgeon: Sable Feil, MD;  Location: [facility];  Service: Endoscopy;  Laterality: N/A;  . ESOPHAGEAL MANOMETRY N/A 02/28/2016   Procedure: ESOPHAGEAL MANOMETRY (EM);  Surgeon: Jerene Bears, MD;  [facility];  Service: Gastroenterology;  Laterality: N/A;  . TONSILLECTOMY    . UVULECTOMY      Family History  Problem Relation Age of Onset  . Dementia Mother   .  Diabetes Mother   . Hypertension Mother   . Heart failure Mother   . Stroke Mother   . Obesity Mother   . Hypertension Unknown        siblings  . Diabetes Sister   . Cancer Brother        lung cancer smoker  . Heart attack Maternal Grandmother   . Diabetes Sister   . Pancreatic disease Sister   . Obesity Father   . Colon cancer Neg Hx     Social History   Socioeconomic History  . Marital status: Married    Spouse name: [name]  . Number of children: Not on file  . Years of education: Not on file  . Highest education level: Not on file  Occupational History  . Occupation: Nature conservation officer: [employer]  . Financial resource strain: Not on file  . Food insecurity:    Worry: Not on file    Inability: Not on file  . Transportation needs:    Medical: Not on file    Non-medical: Not on file  Tobacco Use  . Smoking status: Never Smoker  . Smokeless tobacco: Never Used  Substance and Sexual Activity  . Alcohol use: No  . Drug use: No  . Sexual activity: Yes    Partners: Female  Lifestyle  . Physical activity:  Days per week: Not on file    Minutes per session: Not on file  . Stress: Not on file  Relationships  . Social connections:    Talks on phone: Not on file    Gets together: Not on file    Attends religious service: Not on file    Active member of club or organization: Not on file    Attends meetings of clubs or organizations: Not on file    Relationship status: Not on file  . Intimate partner violence:    Fear of current or ex partner: Not on file    Emotionally abused: Not on file    Physically abused: Not on file    Forced sexual activity: Not on file  Other Topics Concern  . Not on file  Social History Narrative   Tobacco Use - No.    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 acetaminophen (TYLENOL) 500 MG tablet Take 1 tablet (500 mg total) by mouth every 6 (six) hours as needed. 60 tablet 0  . albuterol (PROVENTIL HFA;VENTOLIN HFA) 108 (90 Base) MCG/ACT inhaler Inhale 2 puffs into the lungs every 6 (six) hours as needed for wheezing or shortness of breath. 1 Inhaler 0  . albuterol (PROVENTIL HFA;VENTOLIN HFA) 108 (90 Base) MCG/ACT inhaler Inhale 2 puffs into the lungs every 6 (six) hours as needed for wheezing or shortness of breath. 1 Inhaler 0  . aspirin EC 81 MG tablet Take 81 mg by mouth daily.    [name] BAYER MICROLET LANCETS lancets Test blood sugars twice daily 100 each 0  . Blood Glucose Monitoring Suppl (CONTOUR NEXT MONITOR) w/Device KIT 1 kit by Does not apply route 2 (two) times daily. 1 kit 0  . glucose blood (CONTOUR NEXT TEST) test strip Test blood sugars twice daily 100 each 0  . losartan (COZAAR) 100 MG tablet Take 1 tablet (100 mg total) by mouth daily. 90 tablet 3  . metFORMIN (GLUCOPHAGE) 500 MG tablet Take 1 tablet (500 mg total) by mouth 2 (two) times daily with a meal. 60 tablet 0  . Multiple Vitamin (MULTIVITAMIN) tablet Take 1 tablet by mouth daily.    . NONFORMULARY OR COMPOUNDED ITEM     . ranitidine (ZANTAC) 300 MG tablet Take 1 tablet (300 mg total) by mouth at bedtime. 90 tablet 1  . tadalafil (CIALIS) 5 MG tablet Take 1 tablet (5 mg total) by mouth daily as needed for erectile dysfunction. 30 tablet 5  . triamterene-hydrochlorothiazide (MAXZIDE-25) 37.5-25 MG tablet Take 1 tablet by mouth daily. 90 tablet 1  . Vitamin D, Ergocalciferol, (DRISDOL) 50000 units CAPS capsule Take 1 capsule (50,000 Units total) by mouth every 7 (seven) days. X 12 weeks 4 capsule 4  . meloxicam (MOBIC) 7.5 MG tablet Take 1 tablet (7.5 mg total) by mouth daily. 14  tablet 0  . atorvastatin (LIPITOR) 10 MG tablet Take 1 tablet (10 mg total) by mouth at bedtime. 30 tablet 3  . atorvastatin (LIPITOR) 40  MG tablet Take 1 tablet (40 mg total) by mouth daily. 90 tablet 3  . dicyclomine (BENTYL) 20 MG tablet Take 1 tablet (20 mg total) by mouth 3 (three) times daily as needed for spasms. 90 tablet 5  . esomeprazole (NEXIUM) 40 MG capsule Take 1 capsule (40 mg total) by mouth daily. 90 capsule 1  . fluticasone (FLONASE) 50 MCG/ACT nasal spray Place 2 sprays into both nostrils daily. 16 g 6  . hyoscyamine (LEVSIN SL) 0.125 MG SL tablet Place 1 tablet (0.125 mg total) under the tongue every 4 (four) hours as needed. 30 tablet 5  . omeprazole (PRILOSEC) 40 MG capsule Take 1 capsule (40 mg total) by mouth 2 (two) times daily. 180 capsule 1   No facility-administered medications prior to visit.     Allergies  Allergen Reactions  . Hydrocodone Itching    Review of Systems  Constitutional: Negative for fever and malaise/fatigue.  HENT: Negative for congestion.   Eyes: Negative for blurred vision.  Cardiovascular: Positive for chest pain. Negative for palpitations and leg swelling.       CP ,ild, intermittent with deep breath at times  Gastrointestinal: Positive for abdominal pain and heartburn. Negative for blood in stool and nausea.  Genitourinary: Negative for dysuria and frequency.  Musculoskeletal: Negative for falls.  Skin: Negative for rash.  Neurological: Negative for dizziness, loss of consciousness and headaches.  Endo/Heme/Allergies: Negative for environmental allergies.  Psychiatric/Behavioral: Negative for depression. The patient is not nervous/anxious.        Objective:    Physical Exam  Constitutional: He is oriented to person, place, and time. He appears well-developed and well-nourished. No distress.  HENT:  Head: Normocephalic and atraumatic.  Nose: Nose normal.  Eyes: Right eye exhibits no discharge. Left eye exhibits no discharge.    Neck: Normal range of motion. Neck supple.  Cardiovascular: Normal rate and regular rhythm.  No murmur heard. Pulmonary/Chest: Effort normal and breath sounds normal.  Abdominal: Soft. Bowel sounds are normal. There is no tenderness.  Musculoskeletal: He exhibits no edema.  Neurological: He is alert and oriented to person, place, and time.  Skin: Skin is warm and dry.  Psychiatric: He has a normal mood and affect.  Nursing note and vitals reviewed.   BP 120/90 (BP Location: Left Arm, Patient Position: Sitting, Cuff Size: Normal)   Pulse 83   Temp 98.1 F (36.7 C) (Oral)   Resp 18   Wt 267 lb (121.1 kg)   SpO2 95%   BMI 36.16 kg/m  Wt Readings from Last 3 Encounters:  06/11/18 267 lb (121.1 kg)  01/10/18 256 lb 9.6 oz (116.4 kg)  12/06/17 259 lb 6.4 oz (117.7 kg)     Lab Results  Component Value Date   WBC 4.7 01/10/2018   HGB 16.1 01/10/2018   HCT 47.5 01/10/2018   PLT 196.0 01/10/2018   GLUCOSE 149 (H) 01/10/2018   CHOL 235 (H) 01/10/2018   TRIG 230.0 (H) 01/10/2018   HDL 43.50 01/10/2018   LDLDIRECT 162.0 01/10/2018   LDLCALC 112 (H) 11/27/2016   ALT 38 01/10/2018   AST 25 01/10/2018   NA 137 01/10/2018   K 3.8 01/10/2018   CL 103 01/10/2018   CREATININE 1.12 01/10/2018   BUN 18 01/10/2018   CO2 22 01/10/2018   TSH 1.76 01/10/2018   PSA 2.39 09/21/2016   HGBA1C 6.1 01/10/2018    Lab Results  Component Value Date   TSH 1.76 01/10/2018   Lab Results  Component Value Date   WBC 4.7 01/10/2018   HGB 16.1 01/10/2018   HCT 47.5 01/10/2018   MCV 86.5 01/10/2018   PLT 196.0 01/10/2018   Lab Results  Component Value Date   NA 137 01/10/2018   K 3.8 01/10/2018   CO2 22 01/10/2018   GLUCOSE 149 (H) 01/10/2018   BUN 18 01/10/2018   CREATININE 1.12 01/10/2018   BILITOT 0.3 01/10/2018   ALKPHOS 80 01/10/2018   AST 25 01/10/2018   ALT 38 01/10/2018   PROT 7.7 01/10/2018   ALBUMIN 4.5 01/10/2018   CALCIUM 10.0 01/10/2018   GFR 89.17 01/10/2018    Lab Results  Component Value Date   CHOL 235 (H) 01/10/2018   Lab Results  Component Value Date   HDL 43.50 01/10/2018   Lab Results  Component Value Date   LDLCALC 112 (H) 11/27/2016   Lab Results  Component Value Date   TRIG 230.0 (H) 01/10/2018   Lab Results  Component Value Date   CHOLHDL 5 01/10/2018   Lab Results  Component Value Date   HGBA1C 6.1 01/10/2018       Assessment & Plan:   Problem List Items Addressed This Visit    Hyperlipidemia    Tolerating statin, encouraged heart healthy diet, avoid trans fats, minimize simple carbs and saturated fats. Increase exercise as tolerated      Relevant Medications   atorvastatin (LIPITOR) 10 MG tablet   Other Relevant Orders   Lipid panel   Essential hypertension    Well controlled, no changes to meds. Encouraged heart healthy diet such as the DASH diet and exercise as tolerated.       Relevant Medications   atorvastatin (LIPITOR) 10 MG tablet   Obesity    Encouraged DASH diet, decrease po intake and increase exercise as tolerated. Needs 7-8 hours of sleep nightly. Avoid trans fats, eat small, frequent meals every 4-5 hours with lean proteins, complex carbs and healthy fats. Minimize simple carbs, was at healthy weight and wellness but stopped going he is back on weight loss      Diabetes mellitus type 2 in obese ([name])    hgba1c acceptable, minimize simple carbs. Increase exercise as tolerated. Continue current meds      Relevant Medications   atorvastatin (LIPITOR) 10 MG tablet   Other Relevant Orders   Hemoglobin A1c   Heel pain, bilateral    Referred back to podiatry      Relevant Orders   Ambulatory referral to Podiatry   Substernal chest pain    Persistent chest pain x 3 years. Fluctuates in intensity but is present the vast majority of the time      Relevant Orders   Comprehensive metabolic panel   VITAMIN D 25 Hydroxy (Vit-D Deficiency, Fractures)   Hiatal hernia with gastroesophageal  reflux    Avoid offending foods, start probiotics. Do not eat large meals in late evening and consider raising head of bed.       Relevant Medications   hyoscyamine (LEVSIN SL) 0.125 MG SL tablet   omeprazole (PRILOSEC) 40 MG capsule   Preventative health care    Patient encouraged to maintain heart healthy diet, regular exercise, adequate sleep. Consider daily probiotics. Take medications as prescribed      Relevant Orders   CBC   TSH   Sleep apnea    Stopped CPAP when he lost weight and felt better now worsening sleep restlessness, wakes up choking and fatigue referred back [facility] at [facility]  Relevant Orders   Ambulatory referral to Pulmonology   Dyspnea    Notes some discomfort when he takes deep breath a deep. Will proceed with echo and he agees to seek care if symptoms worsen      Relevant Orders   ECHOCARDIOGRAM COMPLETE   Gastroesophageal reflux disease without esophagitis    Avoid offending foods, start probiotics. Do not eat large meals in late evening and consider raising head of bed. Occasional abdominal cramping. Hyoscyamine prn      Relevant Medications   hyoscyamine (LEVSIN SL) 0.125 MG SL tablet   omeprazole (PRILOSEC) 40 MG capsule   Vitamin D deficiency    Supplement and monitor       Other Visit Diagnoses    Upper respiratory tract infection, unspecified type       Relevant Medications   fluticasone (FLONASE) 50 MCG/ACT nasal spray      I have discontinued Celedonio E. Gulyas's dicyclomine, esomeprazole, and meloxicam. I am also having him maintain his aspirin EC, multivitamin, NONFORMULARY OR COMPOUNDED ITEM, acetaminophen, CONTOUR NEXT MONITOR, metFORMIN, triamterene-hydrochlorothiazide, glucose blood, BAYER MICROLET LANCETS, albuterol, tadalafil, losartan, albuterol, ranitidine, Vitamin D (Ergocalciferol), hyoscyamine, atorvastatin, omeprazole, ibuprofen, and fluticasone.  Meds ordered this encounter  Medications  . hyoscyamine (LEVSIN SL) 0.125 MG  SL tablet    Sig: Place 1 tablet (0.125 mg total) under the tongue every 4 (four) hours as needed.    Dispense:  30 tablet    Refill:  5  . atorvastatin (LIPITOR) 10 MG tablet    Sig: Take 1 tablet (10 mg total) by mouth at bedtime.    Dispense:  30 tablet    Refill:  3  . omeprazole (PRILOSEC) 40 MG capsule    Sig: Take 1 capsule (40 mg total) by mouth 2 (two) times daily.    Dispense:  180 capsule    Refill:  1  . ibuprofen (ADVIL,MOTRIN) 600 MG tablet    Sig: Take 1 tablet (600 mg total) by mouth 3 (three) times daily as needed.    Dispense:  90 tablet    Refill:  1  . fluticasone (FLONASE) 50 MCG/ACT nasal spray    Sig: Place 2 sprays into both nostrils daily.    Dispense:  16 g    Refill:  6     Penni Homans, MD

## 2018-06-16 NOTE — Assessment & Plan Note (Signed)
Well controlled, no changes to meds. Encouraged heart healthy diet such as the DASH diet and exercise as tolerated.  °

## 2018-06-21 ENCOUNTER — Ambulatory Visit (HOSPITAL_BASED_OUTPATIENT_CLINIC_OR_DEPARTMENT_OTHER): Admission: RE | Admit: 2018-06-21 | Payer: Commercial Managed Care - PPO | Source: Ambulatory Visit

## 2018-06-25 NOTE — Progress Notes (Deleted)
[name] [address] [place], [place] [address] Phone: [phone] Subjective:    I'm seeing this patient by the request  of:  [name], MD   CC: left heel pain  [id number]  [name] Foster is a 52 y.o. male coming in with complaint of ***  Onset-  Location Duration-  Character- Aggravating factors- Reliving factors-  Therapies tried-  Severity-     Past Medical History:  Diagnosis Date  . Bone spur    left foot  . Chest pain   . Diabetes mellitus type 2 in obese ([place]) 07/13/2013  . Diverticulosis   . Erectile dysfunction 01/15/2013  . Esophageal reflux 01/15/2013  . Fatty liver 07/04/2015  . GERD (gastroesophageal reflux disease)   . Hiatal hernia   . Hiatal hernia with gastroesophageal reflux 03/20/2014  . Hyperglycemia 07/13/2013  . Hyperplastic colon polyp   . Hypertension   . Internal hemorrhoids   . Mixed hyperlipidemia   . Murmur   . OSA (obstructive sleep apnea)    s/p UPPP  . Plantar fasciitis   . Prediabetes   . Reflux esophagitis   . Stomach ulcer    from PCP  . Urinary hesitancy 09/30/2015   Past Surgical History:  Procedure Laterality Date  . [place] STUDY N/A 02/28/2016   Procedure: [place] STUDY;  Surgeon: Jerene Bears, MD;  [facility];  Service: Gastroenterology;  Laterality: N/A;  . ESOPHAGEAL MANOMETRY N/A 09/01/2013   Procedure: ESOPHAGEAL MANOMETRY (EM);  Surgeon: Sable Feil, MD;  [facility];  Service: Endoscopy;  Laterality: N/A;  . ESOPHAGEAL MANOMETRY N/A 02/28/2016   Procedure: ESOPHAGEAL MANOMETRY (EM);  Surgeon: Jerene Bears, MD;  [facility];  Service: Gastroenterology;  Laterality: N/A;  . TONSILLECTOMY    . UVULECTOMY     Social History   Socioeconomic History  . Marital status: Married    Spouse name: [name] Foster  . Number of children: Not on file  . Years of education: Not on file  . Highest education level: Not on file  Occupational  History  . Occupation: Nature conservation officer: [employer]  . Financial resource strain: Not on file  . Food insecurity:    Worry: Not on file    Inability: Not on file  . Transportation needs:    Medical: Not on file    Non-medical: Not on file  Tobacco Use  . Smoking status: Never Smoker  . Smokeless tobacco: Never Used  Substance and Sexual Activity  . Alcohol use: No  . Drug use: No  . Sexual activity: Yes    Partners: Female  Lifestyle  . Physical activity:    Days per week: Not on file    Minutes per session: Not on file  . Stress: Not on file  Relationships  . Social connections:    Talks on phone: Not on file    Gets together: Not on file    Attends religious service: Not on file    Active member of club or organization: Not on file    Attends meetings of clubs or organizations: Not on file    Relationship status: Not on file  Other Topics Concern  . Not on file  Social History Narrative   Tobacco Use - No.    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 Hydrocodone Itching   Family History  Problem Relation Age of Onset  . Dementia Mother   . Diabetes Mother   . Hypertension Mother   . Heart failure Mother   . Stroke Mother   . Obesity Mother   . Hypertension Unknown        siblings  . Diabetes Sister   . Cancer Brother        lung cancer smoker  . Heart attack Maternal Grandmother   . Diabetes Sister   . Pancreatic disease Sister   . Obesity Father   . Colon cancer Neg Hx     Current Outpatient Medications (Endocrine & Metabolic):  .  metFORMIN (GLUCOPHAGE) 500 MG tablet, Take 1 tablet (500 mg total) by mouth 2 (two) times  daily with a meal.  Current Outpatient Medications (Cardiovascular):  .  atorvastatin (LIPITOR) 10 MG tablet, Take 1 tablet (10 mg total) by mouth at bedtime. [name]  losartan (COZAAR) 100 MG tablet, Take 1 tablet (100 mg total) by mouth daily. .  tadalafil (CIALIS) 5 MG tablet, Take 1 tablet (5 mg total) by mouth daily as needed for erectile dysfunction. .  triamterene-hydrochlorothiazide (MAXZIDE-25) 37.5-25 MG tablet, Take 1 tablet by mouth daily.  Current Outpatient Medications (Respiratory):  .  albuterol (PROVENTIL HFA;VENTOLIN HFA) 108 (90 Base) MCG/ACT inhaler, Inhale 2 puffs into the lungs every 6 (six) hours as needed for wheezing or shortness of breath. [name]  albuterol (PROVENTIL HFA;VENTOLIN HFA) 108 (90 Base) MCG/ACT inhaler, Inhale 2 puffs into the lungs every 6 (six) hours as needed for wheezing or shortness of breath. .  fluticasone (FLONASE) 50 MCG/ACT nasal spray, Place 2 sprays into both nostrils daily.  Current Outpatient Medications (Analgesics):  .  acetaminophen (TYLENOL) 500 MG tablet, Take 1 tablet (500 mg total) by mouth every 6 (six) hours as needed. [name]  aspirin EC 81 MG tablet, Take 81 mg by mouth daily. [name]  ibuprofen (ADVIL,MOTRIN) 600 MG tablet, Take 1 tablet (600 mg total) by mouth 3 (three) times daily as needed.   Current Outpatient Medications (Other):  .  BAYER MICROLET LANCETS lancets, Test blood sugars twice daily .  Blood Glucose Monitoring Suppl (CONTOUR NEXT MONITOR) w/Device KIT, 1 kit by Does not apply route 2 (two) times daily. [name]  glucose blood (CONTOUR NEXT TEST) test strip, Test blood sugars twice daily .  hyoscyamine (LEVSIN SL) 0.125 MG SL tablet, Place 1 tablet (0.125 mg total) under the tongue every 4 (four) hours as needed. .  Multiple Vitamin (MULTIVITAMIN) tablet, Take 1 tablet by mouth daily. .  NONFORMULARY OR COMPOUNDED ITEM,  .  omeprazole (PRILOSEC) 40 MG capsule, Take 1 capsule (40 mg total) by mouth 2 (two) times daily. .  ranitidine  (ZANTAC) 300 MG tablet, Take 1 tablet (300 mg total) by mouth at bedtime. .  Vitamin D, Ergocalciferol, (DRISDOL) 50000 units CAPS capsule, Take 1 capsule (50,000 Units total) by mouth every 7 (seven) days. X 12 weeks    Past medical history, social, surgical and family history all reviewed in electronic medical record.  No pertanent information unless stated regarding to  the chief complaint.   Review of Systems:  No headache, visual changes, nausea, vomiting, diarrhea, constipation, dizziness, abdominal pain, skin rash, fevers, chills, night sweats, weight loss, swollen lymph nodes, body aches, joint swelling, muscle aches, chest pain, shortness of breath, mood changes.   Objective  There were no vitals taken for this visit. Systems examined below as of    General: No apparent distress alert and oriented x3 mood and affect normal, dressed appropriately.  HEENT: Pupils equal, extraocular movements intact  Respiratory: Patient's speak in full sentences and does not appear short of breath  Cardiovascular: No lower extremity edema, non tender, no erythema  Skin: Warm dry intact with no signs of infection or rash on extremities or on axial skeleton.  Abdomen: Soft nontender  Neuro: Cranial nerves II through XII are intact, neurovascularly intact in all extremities with 2+ DTRs and 2+ pulses.  Lymph: No lymphadenopathy of posterior or anterior cervical chain or axillae bilaterally.  Gait normal with good balance and coordination.  MSK:  Non tender with full range of motion and good stability and symmetric strength and tone of shoulders, elbows, wrist, hip, knee and ankles bilaterally.     Impression and Recommendations:     This case required medical decision making of moderate complexity. The above documentation has been reviewed and is accurate and complete Lyndal Pulley, DO       Note: This dictation was prepared with Dragon dictation along with smaller phrase technology. Any  transcriptional errors that result from this process are unintentional.

## 2018-06-26 ENCOUNTER — Ambulatory Visit: Payer: Self-pay | Admitting: Family Medicine

## 2018-06-26 DIAGNOSIS — Z0289 Encounter for other administrative examinations: Secondary | ICD-10-CM

## 2018-07-01 ENCOUNTER — Ambulatory Visit (HOSPITAL_BASED_OUTPATIENT_CLINIC_OR_DEPARTMENT_OTHER)
Admission: RE | Admit: 2018-07-01 | Discharge: 2018-07-01 | Disposition: A | Discharge status: Home / Self Care | Payer: Commercial Managed Care - PPO | Source: Ambulatory Visit | Attending: Family Medicine | Admitting: Family Medicine

## 2018-07-01 ENCOUNTER — Telehealth: Payer: Self-pay | Admitting: Family Medicine

## 2018-07-01 DIAGNOSIS — R0600 Dyspnea, unspecified: Secondary | ICD-10-CM | POA: Insufficient documentation

## 2018-07-01 DIAGNOSIS — I10 Essential (primary) hypertension: Secondary | ICD-10-CM

## 2018-07-01 NOTE — Progress Notes (Signed)
  Echocardiogram 2D Echocardiogram has been performed.  Labria Wos T Vick Filter 07/01/2018, 3:45 PM

## 2018-07-01 NOTE — Telephone Encounter (Signed)
OK to switch him to Telmisartan 80 mg tabs, 1 tab po daily, disp #30 with 3 rf and check BP with cmp in 2-4 weeks to assess response

## 2018-07-01 NOTE — Telephone Encounter (Signed)
Pt came into office states he needs a replacement for Lorsatan. He said pharmacy called him and said it was a recall on medication. He states this is not the first recall and wants another prescription. He asks if he can be called once the prescription is sent to pharmacy.

## 2018-07-02 MED ORDER — TELMISARTAN 80 MG PO TABS: 80.0000 mg | ORAL_TABLET | Freq: Every day | ORAL | 3 refills | Status: DC

## 2018-07-02 NOTE — Addendum Note (Signed)
Addended by: Magdalene Molly A on: 07/02/2018 12:17 PM   Modules accepted: Orders

## 2018-07-02 NOTE — Telephone Encounter (Signed)
Left message for patient to call the office back

## 2018-07-02 NOTE — Telephone Encounter (Signed)
Nurse triage may handle   I have sent in medication  I have placed orders for lab work as well

## 2018-07-15 ENCOUNTER — Encounter: Payer: Self-pay | Admitting: Family Medicine

## 2018-07-15 ENCOUNTER — Telehealth: Payer: Self-pay | Admitting: *Deleted

## 2018-07-15 ENCOUNTER — Ambulatory Visit (INDEPENDENT_AMBULATORY_CARE_PROVIDER_SITE_OTHER): Payer: Commercial Managed Care - PPO | Admitting: Family Medicine

## 2018-07-15 VITALS — BP 162/98 | HR 60 | Temp 98.0°F | Resp 16 | Ht 72.5 in | Wt 268.0 lb

## 2018-07-15 DIAGNOSIS — I10 Essential (primary) hypertension: Secondary | ICD-10-CM | POA: Diagnosis not present

## 2018-07-15 MED ORDER — AMLODIPINE BESYLATE 5 MG PO TABS: 5.0000 mg | ORAL_TABLET | Freq: Every day | ORAL | 3 refills | Status: DC

## 2018-07-15 NOTE — Telephone Encounter (Signed)
Author phoned pt. to f/u on unaddressed concerns. The last telephone call from pt or wife was from 10/8 per phone call records pulled by Engineer, building services. PEC agent had advised pt. to call pharmacy to see if the losartan that was recalled was the batch he had in his possession, and pt. Verbalized understanding over the phone. No answer, author left VM asking for return call prior to OV with Dr. Lorelei Pont if possible. Chief Strategy Officer then phoned wife, [name], but kept ringing. Dr .Lorelei Pont made aware of situation.

## 2018-07-15 NOTE — Telephone Encounter (Signed)
FYI: Pt's wife called the [facility] and was very upset with our office. She states that no one calls them back with results [lab, ECHO]. And/or regarding need to change medication [Losartan] d/t recall. His medication was changed to Telmisartan on 07/02/18 after he came to the office on 07/01/18 making the request. It does look as if we LMOVM for call back on 07/02/18, but also note that triage "may handle"; medication was sent to pharmacy and lab orders were placed, but no appointment made [facility] states pt is off work on Wednesdays, if they had known lab orders were placed]. Wife states she called [facility] this morning after 7am to make patient an appointment, but I do not have a note in chart from the [facility] and caller does not know why an appt was not made. Pt's wife would like to find out who she spoke with this morning at the [facility] when they come in today. I have scheduled pt appointment with Dr. Lorelei Pont at 1:15pm today: Nausea and vomiting believed to be adverse reaction to Telmisartan; pt would like to change Rx to something that is not a "sartan" [states there have just been too many problems with these meds; I did go over the recall information]/SLS 11/04

## 2018-07-15 NOTE — Patient Instructions (Signed)
Good to see you today!  Your recent echocardiogram was virtually normal, as follows  Study Conclusions  - Left ventricle: The cavity size was normal. There was mild   concentric hypertrophy. Systolic function was normal. The   estimated ejection fraction was in the range of 55% to 60%. Wall   motion was normal; there were no regional wall motion   abnormalities. Doppler parameters are consistent with abnormal   left ventricular relaxation (grade 1 diastolic dysfunction). - Aortic valve: There was no significant regurgitation. - Mitral valve: There was no significant regurgitation. - Right ventricle: Systolic function was normal. - Atrial septum: No defect or patent foramen ovale was identified. - Tricuspid valve: There was no significant regurgitation. - Pulmonic valve: There was no significant regurgitation.  Impressions:  - Normal systolic function, grade 1 diastolic dysfunction, no   significant valvular abnormalities  Please give Dr. Blenda Mounts office a call and set up a routine follow-up visit at your convenience Please start back on your triamterene/ hctz  We will stop telmisartan and change to amlodipine 5mg - take this as well as the triamterene pill Please email me with BP readings in 2-3 days Let us know if your vomiting or other stomach concerns return

## 2018-07-15 NOTE — Progress Notes (Signed)
[facility] [place], [place],  [address] [phone]  Date:  07/15/2018   Name:  [name] Foster   DOB:  09-Jun-1966   MRN:  [id number]  PCP:  [name], MD    Chief Complaint: Nausea (Vomiting, going on 2 days, worse yesterday, ate chicken-took telmisartan 32m)   History of Present Illness:  [name] Foster is a 52y.o. very pleasant male patient who presents with the following:  History of DM, HTN, hyperlipidemia Here today with concern of adverse effect of new med-   He also had an echo on 10/21 as follows, went over results with him today;  Study Conclusions - Left ventricle: The cavity size was normal. There was mild   concentric hypertrophy. Systolic function was normal. The   estimated ejection fraction was in the range of 55% to 60%. Wall   motion was normal; there were no regional wall motion   abnormalities. Doppler parameters are consistent with abnormal   left ventricular relaxation (grade 1 diastolic dysfunction). - Aortic valve: There was no significant regurgitation. - Mitral valve: There was no significant regurgitation. - Right ventricle: Systolic function was normal. - Atrial septum: No defect or patent foramen ovale was identified. - Tricuspid valve: There was no significant regurgitation. - Pulmonic valve: There was no significant regurgitation. Impressions: - Normal systolic function, grade 1 diastolic dysfunction, no   significant valvular abnormalities.  He was concerned about the recent recall on his losartan so they changed to telmisartan 80 daily He changed over to telmisartan about a week ago.  However yesterday he had nausea and vomiting, he was not sure if this might be due to telmisartan He notes that he vomited several times yesterday after taking the telmisartan. He did not take it today and feels ok . He is eating again. No abdominal pain No diarrhea No sick  contacts at home  He was on lisinopril in the past but it caused cough  He is still on triamterene/ hctz and was on losartan 100 previous However he also ran out of his maxzide a few days ago so has not taken any BP meds in the last day.    He has noted HA the last day or so He has known a known long term history of chest pain- there is no change here No SOB  He is a pt of Dr. ROval Linsey he is due for a follow-up He has done a stress in the past, I cannot see the results  He drives a city bus.   The CP is most often there while sitting and not with exertion  Lab Results  Component Value Date   HGBA1C 6.1 01/10/2018   Asa lipitor Metformin   Patient Active Problem List   Diagnosis Date Noted  . Vitamin D deficiency 01/24/2017  . Type 2 diabetes mellitus without complication, without long-term current use of insulin ([place] 01/24/2017  . Depression 01/24/2017  . Shortness of breath on exertion 11/27/2016  . Internal hemorrhoids   . Atypical chest pain   . Gastroesophageal reflux disease without esophagitis   . Dyspnea 01/18/2016  . Preventative health care 10/10/2015  . Sleep apnea 10/10/2015  . Urinary hesitancy 09/30/2015  . Fatty liver 07/04/2015  . Edema 05/07/2014  . Scleroderma of esophagus ([place] 03/20/2014  . Hiatal hernia with gastroesophageal reflux 03/20/2014  . Diabetes mellitus type 2 in obese ([place] 07/13/2013  . Anxiety  state, unspecified 07/13/2013  . Erectile dysfunction 01/15/2013  . Esophageal reflux 01/15/2013  . Substernal chest pain 07/23/2012  . Back pain 03/09/2012  . Mesenteric panniculitis ([place]) 02/06/2012  . Heel pain, bilateral 04/25/2010  . Obesity 01/05/2010  . Hyperlipidemia 10/04/2009  . Essential hypertension 10/04/2009    Past Medical History:  Diagnosis Date  . Bone spur    left foot  . Chest pain   . Diabetes mellitus type 2 in obese ([place]) 07/13/2013  . Diverticulosis   . Erectile dysfunction 01/15/2013  . Esophageal reflux  01/15/2013  . Fatty liver 07/04/2015  . GERD (gastroesophageal reflux disease)   . Hiatal hernia   . Hiatal hernia with gastroesophageal reflux 03/20/2014  . Hyperglycemia 07/13/2013  . Hyperplastic colon polyp   . Hypertension   . Internal hemorrhoids   . Mixed hyperlipidemia   . Murmur   . OSA (obstructive sleep apnea)    s/p UPPP  . Plantar fasciitis   . Prediabetes   . Reflux esophagitis   . Stomach ulcer    from PCP  . Urinary hesitancy 09/30/2015    Past Surgical History:  Procedure Laterality Date  . [place] STUDY N/A 02/28/2016   Procedure: [place] STUDY;  Surgeon: Jerene Bears, MD;  Location: [facility];  Service: Gastroenterology;  Laterality: N/A;  . ESOPHAGEAL MANOMETRY N/A 09/01/2013   Procedure: ESOPHAGEAL MANOMETRY (EM);  Surgeon: Sable Feil, MD;  Location: [facility];  Service: Endoscopy;  Laterality: N/A;  . ESOPHAGEAL MANOMETRY N/A 02/28/2016   Procedure: ESOPHAGEAL MANOMETRY (EM);  Surgeon: Jerene Bears, MD;  Location: [facility];  Service: Gastroenterology;  Laterality: N/A;  . TONSILLECTOMY    . UVULECTOMY      Social History   Tobacco Use  . Smoking status: Never Smoker  . Smokeless tobacco: Never Used  Substance Use Topics  . Alcohol use: No  . Drug use: No    Family History  Problem Relation Age of Onset  . Dementia Mother   . Diabetes Mother   . Hypertension Mother   . Heart failure Mother   . Stroke Mother   . Obesity Mother   . Hypertension Unknown        siblings  . Diabetes Sister   . Cancer Brother        lung cancer smoker  . Heart attack Maternal Grandmother   . Diabetes Sister   . Pancreatic disease Sister   . Obesity Father   . Colon cancer Neg Hx     Allergies  Allergen Reactions  . Hydrocodone Itching    Medication list has been reviewed and updated.  Current Outpatient Medications on File Prior to Visit  Medication Sig Dispense Refill  . acetaminophen (TYLENOL) 500 MG tablet Take 1 tablet (500 mg  total) by mouth every 6 (six) hours as needed. 60 tablet 0  . albuterol (PROVENTIL HFA;VENTOLIN HFA) 108 (90 Base) MCG/ACT inhaler Inhale 2 puffs into the lungs every 6 (six) hours as needed for wheezing or shortness of breath. 1 Inhaler 0  . albuterol (PROVENTIL HFA;VENTOLIN HFA) 108 (90 Base) MCG/ACT inhaler Inhale 2 puffs into the lungs every 6 (six) hours as needed for wheezing or shortness of breath. 1 Inhaler 0  . aspirin EC 81 MG tablet Take 81 mg by mouth daily.    [name] atorvastatin (LIPITOR) 10 MG tablet Take 1 tablet (10 mg total) by mouth at bedtime. 30 tablet 3  . BAYER MICROLET LANCETS lancets Test  blood sugars twice daily 100 each 0  . Blood Glucose Monitoring Suppl (CONTOUR NEXT MONITOR) w/Device KIT 1 kit by Does not apply route 2 (two) times daily. 1 kit 0  . fluticasone (FLONASE) 50 MCG/ACT nasal spray Place 2 sprays into both nostrils daily. 16 g 6  . glucose blood (CONTOUR NEXT TEST) test strip Test blood sugars twice daily 100 each 0  . hyoscyamine (LEVSIN SL) 0.125 MG SL tablet Place 1 tablet (0.125 mg total) under the tongue every 4 (four) hours as needed. 30 tablet 5  . ibuprofen (ADVIL,MOTRIN) 600 MG tablet Take 1 tablet (600 mg total) by mouth 3 (three) times daily as needed. 90 tablet 1  . metFORMIN (GLUCOPHAGE) 500 MG tablet Take 1 tablet (500 mg total) by mouth 2 (two) times daily with a meal. 60 tablet 0  . Multiple Vitamin (MULTIVITAMIN) tablet Take 1 tablet by mouth daily.    . NONFORMULARY OR COMPOUNDED ITEM     . omeprazole (PRILOSEC) 40 MG capsule Take 1 capsule (40 mg total) by mouth 2 (two) times daily. 180 capsule 1  . ranitidine (ZANTAC) 300 MG tablet Take 1 tablet (300 mg total) by mouth at bedtime. 90 tablet 1  . tadalafil (CIALIS) 5 MG tablet Take 1 tablet (5 mg total) by mouth daily as needed for erectile dysfunction. 30 tablet 5  . triamterene-hydrochlorothiazide (MAXZIDE-25) 37.5-25 MG tablet Take 1 tablet by mouth daily. 90 tablet 1  . Vitamin D,  Ergocalciferol, (DRISDOL) 50000 units CAPS capsule Take 1 capsule (50,000 Units total) by mouth every 7 (seven) days. X 12 weeks 4 capsule 4   No current facility-administered medications on file prior to visit.     Review of Systems:  As per HPI- otherwise negative.  No fever or chills No rash  Physical Examination: Vitals:   07/15/18 1328 07/15/18 1354  BP: (!) 174/100 (!) 162/98  Pulse: (!) 110 60  Resp: 16   Temp: 98 F (36.7 C)   SpO2: 96%    Vitals:   07/15/18 1328  Weight: 268 lb (121.6 kg)  Height: 6' 0.5" (1.842 m)   Body mass index is 35.85 kg/m. Ideal Body Weight: Weight in (lb) to have BMI = 25: 186.5  GEN: WDWN, NAD, Non-toxic, A & O x 3, overweight. Muscular build., looks well  HEENT: Atraumatic, Normocephalic. Neck supple. No masses, No LAD.  Bilateral TM wnl, oropharynx normal.  PEERL,EOMI.   Ears and Nose: No external deformity. CV: RRR, No M/G/R. No JVD. No thrill. No extra heart sounds. PULM: CTA B, no wheezes, crackles, rhonchi. No retractions. No resp. distress. No accessory muscle use. ABD: S, NT, ND, +BS. No rebound. No HSM. Belly is quite benign today  EXTR: No c/c/e NEURO Normal gait.  PSYCH: Normally interactive. Conversant. Not depressed or anxious appearing.  Calm demeanor.    Assessment and Plan: Essential hypertension - Plan: amLODipine (NORVASC) 5 MG tablet  Following up today with concern of adverse effect of his telmisartan He has been off all BP meds for a day or so, which is likely why his BP is high.  He would like to change to a different class of med which is ok Will have him start on amlodipine 5 mg and re-start maxzide today Follow-up with cardiology at his convenience Asked him to email me with home BP readings in a couple of days and he agrees to do so    Signed Lamar Blinks, MD

## 2018-08-02 ENCOUNTER — Telehealth: Payer: Self-pay | Admitting: Family Medicine

## 2018-08-02 NOTE — Telephone Encounter (Signed)
Copied from [place] [id number]. Topic: Quick Communication - See Telephone Encounter >> Aug 02, 2018 10:40 AM Rosalin Hawking wrote: CRM for notification. See Telephone encounter for: 08/02/18.   Pt called the office and left a message stating is needing a referral to a Podiatrist. (no more details on message- called pt LVM to get more inform about pt's referral). Please advise.

## 2018-08-05 NOTE — Telephone Encounter (Signed)
Called patient left message for patient to call the office back  

## 2018-08-07 ENCOUNTER — Encounter: Payer: Self-pay | Admitting: Podiatry

## 2018-08-07 ENCOUNTER — Ambulatory Visit (INDEPENDENT_AMBULATORY_CARE_PROVIDER_SITE_OTHER): Payer: Commercial Managed Care - PPO

## 2018-08-07 ENCOUNTER — Ambulatory Visit (INDEPENDENT_AMBULATORY_CARE_PROVIDER_SITE_OTHER): Payer: Commercial Managed Care - PPO | Admitting: Podiatry

## 2018-08-07 DIAGNOSIS — M7662 Achilles tendinitis, left leg: Secondary | ICD-10-CM | POA: Diagnosis not present

## 2018-08-07 DIAGNOSIS — M722 Plantar fascial fibromatosis: Secondary | ICD-10-CM

## 2018-08-07 DIAGNOSIS — M7661 Achilles tendinitis, right leg: Secondary | ICD-10-CM | POA: Diagnosis not present

## 2018-08-07 MED ORDER — TRIAMCINOLONE ACETONIDE 10 MG/ML IJ SUSP
10.0000 mg | Freq: Once | INTRAMUSCULAR | Status: AC
  Administered 2018-08-07: 10 mg

## 2018-08-07 MED ORDER — DICLOFENAC SODIUM 75 MG PO TBEC: 75.0000 mg | DELAYED_RELEASE_TABLET | Freq: Two times a day (BID) | ORAL | 2 refills | Status: DC

## 2018-08-07 NOTE — Patient Instructions (Signed)

## 2018-08-07 NOTE — Progress Notes (Signed)
   Subjective:    Patient ID: [name] Foster, male    DOB: [birth year], 52 y.o.   MRN: [id number]  HPI    Review of Systems  All other systems reviewed and are negative.      Objective:   Physical Exam        Assessment & Plan:

## 2018-08-07 NOTE — Progress Notes (Signed)
Subjective:   Patient ID: [name] Foster, male   DOB: 52 y.o.   MRN: [id number]   HPI patient states that now his right heel that is really killing him it is been very bad for the last 4 months and he still had trouble with his left heel over the last couple years since we have last seen him.  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   ROS      Objective:  Physical Exam  Neurovascular status intact muscle strength was adequate with patient's posterior medial right heel at the insertion calcaneus been very tender and inflamed.  The left is mildly to moderately tender and there is obvious spur formation posterior heel region with chronic nature to the Achilles     Assessment:  Acute Achilles tendinitis right medial side along with chronic Achilles tendinitis bilateral     Plan:  H&P conditions reviewed and I discussed injection explaining risk of rupture associated with injection.  Patient wants this done right and I did a sterile prep and then injected the medial side 3 mg Dexasone Kenalog 5 mg Xylocaine and applied air fracture walker to mobilize.  I then went ahead and advised this patient on reduced work for the next couple weeks and seeing whether rest will be of help and patient will be seen back 3 weeks  X-rays indicate significant large spur formation posterior aspect heel region bilateral

## 2018-08-09 ENCOUNTER — Other Ambulatory Visit: Payer: Self-pay | Admitting: Family Medicine

## 2018-08-09 DIAGNOSIS — I10 Essential (primary) hypertension: Secondary | ICD-10-CM

## 2018-08-20 ENCOUNTER — Telehealth: Payer: Self-pay

## 2018-08-20 DIAGNOSIS — M79643 Pain in unspecified hand: Secondary | ICD-10-CM

## 2018-08-20 DIAGNOSIS — M549 Dorsalgia, unspecified: Secondary | ICD-10-CM

## 2018-08-20 DIAGNOSIS — M25519 Pain in unspecified shoulder: Secondary | ICD-10-CM

## 2018-08-20 NOTE — Telephone Encounter (Signed)
Copied from [place] [phone]. Topic: Referral - Request for Referral >> Aug 19, 2018  8:31 AM Ivar Drape wrote: Has patient seen PCP for this complaint? NO *If NO, is insurance requiring patient see PCP for this issue before PCP can refer them? NO Referral for which specialty:   Ortho Preferred provider/office:   NO Reason for referral:  Back, Shoulder and Hand pain from a worker's comp claim

## 2018-08-21 ENCOUNTER — Encounter: Payer: Self-pay | Admitting: Podiatry

## 2018-08-21 ENCOUNTER — Ambulatory Visit (INDEPENDENT_AMBULATORY_CARE_PROVIDER_SITE_OTHER): Payer: Commercial Managed Care - PPO | Admitting: Podiatry

## 2018-08-21 DIAGNOSIS — M7661 Achilles tendinitis, right leg: Secondary | ICD-10-CM | POA: Diagnosis not present

## 2018-08-21 DIAGNOSIS — M7662 Achilles tendinitis, left leg: Secondary | ICD-10-CM

## 2018-08-21 NOTE — Progress Notes (Signed)
Subjective:   Patient ID: [name] Foster, male   DOB: 52 y.o.   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  I am sending him to physical therapy to try to use modalities to get the symptoms under control and it would be better if he can be in boxes that do not have his heaviest pedal.  Patient will be seen back again in 5 weeks and initiate physical therapy in the next week or 2

## 2018-08-27 ENCOUNTER — Ambulatory Visit (INDEPENDENT_AMBULATORY_CARE_PROVIDER_SITE_OTHER): Payer: Commercial Managed Care - PPO | Admitting: Family Medicine

## 2018-08-28 ENCOUNTER — Ambulatory Visit: Payer: Commercial Managed Care - PPO | Admitting: Podiatry

## 2018-08-29 ENCOUNTER — Encounter (INDEPENDENT_AMBULATORY_CARE_PROVIDER_SITE_OTHER): Payer: Self-pay | Admitting: Family Medicine

## 2018-08-29 ENCOUNTER — Ambulatory Visit (INDEPENDENT_AMBULATORY_CARE_PROVIDER_SITE_OTHER): Payer: Commercial Managed Care - PPO | Admitting: Family Medicine

## 2018-08-29 DIAGNOSIS — M545 Low back pain, unspecified: Secondary | ICD-10-CM

## 2018-08-29 DIAGNOSIS — G8929 Other chronic pain: Secondary | ICD-10-CM | POA: Diagnosis not present

## 2018-08-29 DIAGNOSIS — M25511 Pain in right shoulder: Secondary | ICD-10-CM | POA: Diagnosis not present

## 2018-08-29 MED ORDER — ETODOLAC 400 MG PO TABS: 400.0000 mg | ORAL_TABLET | Freq: Two times a day (BID) | ORAL | 3 refills | Status: DC | PRN

## 2018-08-29 MED ORDER — BACLOFEN 10 MG PO TABS: 5.0000 mg | ORAL_TABLET | Freq: Every evening | ORAL | 3 refills | Status: DC | PRN
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
## 2018-09-05 DIAGNOSIS — M62838 Other muscle spasm: Secondary | ICD-10-CM | POA: Diagnosis not present

## 2018-09-05 DIAGNOSIS — M545 Low back pain: Secondary | ICD-10-CM | POA: Diagnosis not present

## 2018-09-09 DIAGNOSIS — M62838 Other muscle spasm: Secondary | ICD-10-CM | POA: Diagnosis not present

## 2018-09-09 DIAGNOSIS — M545 Low back pain: Secondary | ICD-10-CM | POA: Diagnosis not present

## 2018-09-10 ENCOUNTER — Emergency Department (HOSPITAL_BASED_OUTPATIENT_CLINIC_OR_DEPARTMENT_OTHER)
Admission: EM | Admit: 2018-09-10 | Discharge: 2018-09-10 | Disposition: A | Discharge status: Home / Self Care | Payer: Commercial Managed Care - PPO | Attending: Emergency Medicine | Admitting: Emergency Medicine

## 2018-09-10 ENCOUNTER — Encounter (HOSPITAL_BASED_OUTPATIENT_CLINIC_OR_DEPARTMENT_OTHER): Payer: Self-pay | Admitting: Emergency Medicine

## 2018-09-10 ENCOUNTER — Emergency Department (HOSPITAL_BASED_OUTPATIENT_CLINIC_OR_DEPARTMENT_OTHER): Payer: Commercial Managed Care - PPO

## 2018-09-10 ENCOUNTER — Other Ambulatory Visit: Payer: Self-pay

## 2018-09-10 DIAGNOSIS — Z7984 Long term (current) use of oral hypoglycemic drugs: Secondary | ICD-10-CM | POA: Insufficient documentation

## 2018-09-10 DIAGNOSIS — M545 Low back pain, unspecified: Secondary | ICD-10-CM

## 2018-09-10 DIAGNOSIS — I10 Essential (primary) hypertension: Secondary | ICD-10-CM | POA: Insufficient documentation

## 2018-09-10 DIAGNOSIS — Z7982 Long term (current) use of aspirin: Secondary | ICD-10-CM | POA: Insufficient documentation

## 2018-09-10 DIAGNOSIS — E119 Type 2 diabetes mellitus without complications: Secondary | ICD-10-CM | POA: Insufficient documentation

## 2018-09-10 DIAGNOSIS — Z79899 Other long term (current) drug therapy: Secondary | ICD-10-CM | POA: Insufficient documentation

## 2018-09-10 NOTE — ED Provider Notes (Signed)
[facility] EMERGENCY DEPARTMENT Provider Note   CSN: [id number] Arrival date & time: 09/10/18  0749     History   Chief Complaint Chief Complaint  Patient presents with  . Back Pain    HPI [name] Foster is a 52 y.o. male.  HPI Patient presents to the emergency room for evaluation of back pain.  Patient states he was doing some yard work yesterday when he picked something up when he felt a click in his back and developed severe sharp pain.  Patient had a therapy appointment scheduled because he has had some issues with back pain from a workers comp injury.  Patient had his treatment but his pain is still sharp and severe.  He decided come to the emergency room to make sure there is nothing wrong.  He denies any trouble with numbness or weakness.  No difficulty with urination. Past Medical History:  Diagnosis Date  . Bone spur    left foot  . Chest pain   . Diabetes mellitus type 2 in obese ([place]) 07/13/2013  . Diverticulosis   . Erectile dysfunction 01/15/2013  . Esophageal reflux 01/15/2013  . Fatty liver 07/04/2015  . GERD (gastroesophageal reflux disease)   . Hiatal hernia   . Hiatal hernia with gastroesophageal reflux 03/20/2014  . Hyperglycemia 07/13/2013  . Hyperplastic colon polyp   . Hypertension   . Internal hemorrhoids   . Mixed hyperlipidemia   . Murmur   . OSA (obstructive sleep apnea)    s/p UPPP  . Plantar fasciitis   . Prediabetes   . Reflux esophagitis   . Stomach ulcer    from PCP  . Urinary hesitancy 09/30/2015    Patient Active Problem List   Diagnosis Date Noted  . Bilateral inguinal hernia without obstruction or gangrene 04/11/2017  . Umbilical hernia without obstruction and without gangrene 04/11/2017  . Vitamin D deficiency 01/24/2017  . Type 2 diabetes mellitus without complication, without long-term current use of insulin ([place]) 01/24/2017  . Depression 01/24/2017  . Shortness of breath on exertion 11/27/2016  . Internal hemorrhoids    . Atypical chest pain   . Gastroesophageal reflux disease without esophagitis   . Dyspnea 01/18/2016  . Preventative health care 10/10/2015  . Sleep apnea 10/10/2015  . Urinary hesitancy 09/30/2015  . Fatty liver 07/04/2015  . Edema 05/07/2014  . Scleroderma of esophagus ([place]) 03/20/2014  . Hiatal hernia with gastroesophageal reflux 03/20/2014  . Diabetes mellitus type 2 in obese ([place]) 07/13/2013  . Anxiety state, unspecified 07/13/2013  . Erectile dysfunction 01/15/2013  . Esophageal reflux 01/15/2013  . Substernal chest pain 07/23/2012  . Back pain 03/09/2012  . 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 Heel pain, bilateral 04/25/2010  . Obesity 01/05/2010  . Hyperlipidemia 10/04/2009  . Essential hypertension 10/04/2009    Past Surgical History:  Procedure Laterality Date  . [place] STUDY N/A 02/28/2016   Procedure: [place] STUDY;  Surgeon: Jerene Bears, MD;  Location: [facility] ENDOSCOPY;  Service: Gastroenterology;  Laterality: N/A;  . ESOPHAGEAL MANOMETRY N/A 09/01/2013   Procedure: ESOPHAGEAL MANOMETRY (EM);  Surgeon: Sable Feil, MD;  Location: [facility] ENDOSCOPY;  Service: Endoscopy;  Laterality: N/A;  . ESOPHAGEAL MANOMETRY N/A 02/28/2016   Procedure: ESOPHAGEAL MANOMETRY (EM);  Surgeon: Jerene Bears, MD;  Location: [facility] ENDOSCOPY;  Service: Gastroenterology;  Laterality: N/A;  . TONSILLECTOMY    . UVULECTOMY          Home Medications  Prior to Admission medications   Medication Sig Start Date End Date Taking? Authorizing Provider  acetaminophen (TYLENOL) 500 MG tablet Take 1 tablet (500 mg total) by mouth every 6 (six) hours as needed. 05/24/16   [name], MD  albuterol (PROVENTIL HFA;VENTOLIN HFA) 108 (90 Base) MCG/ACT inhaler Inhale 2 puffs into the lungs every 6 (six) hours as needed for wheezing or shortness of breath. 12/06/17   Debbrah Alar, NP  amLODipine (NORVASC) 5 MG tablet Take 1 tablet (5 mg total) by mouth daily. 07/15/18   Copland,  Gay Filler, MD  aspirin EC 81 MG tablet Take 81 mg by mouth daily.    [provider]  atorvastatin (LIPITOR) 10 MG tablet Take 1 tablet (10 mg total) by mouth at bedtime. 06/11/18   [name], MD  baclofen (LIORESAL) 10 MG tablet Take 0.5-1 tablets (5-10 mg total) by mouth at bedtime as needed for muscle spasms. 08/29/18   Hilts, Legrand Como, MD  BAYER MICROLET LANCETS lancets Test blood sugars twice daily 10/12/17   Shelda Pal, DO  Blood Glucose Monitoring Suppl (CONTOUR NEXT MONITOR) w/Device KIT 1 kit by Does not apply route 2 (two) times daily. 03/15/17   Dennard Nip D, MD  diclofenac (VOLTAREN) 75 MG EC tablet Take 1 tablet (75 mg total) by mouth 2 (two) times daily. 08/07/18   Wallene Huh, DPM  etodolac (LODINE) 400 MG tablet Take 1 tablet (400 mg total) by mouth 2 (two) times daily as needed. 08/29/18   Hilts, Legrand Como, MD  fluticasone (FLONASE) 50 MCG/ACT nasal spray Place 2 sprays into both nostrils daily. 06/11/18   [name], MD  glucose blood (CONTOUR NEXT TEST) test strip Test blood sugars twice daily 10/12/17   Shelda Pal, DO  hyoscyamine (LEVSIN SL) 0.125 MG SL tablet Place 1 tablet (0.125 mg total) under the tongue every 4 (four) hours as needed. 06/11/18   [name], MD  ibuprofen (ADVIL,MOTRIN) 600 MG tablet Take 1 tablet (600 mg total) by mouth 3 (three) times daily as needed. Patient not taking: Reported on 08/29/2018 06/11/18   [name], MD  metFORMIN (GLUCOPHAGE) 500 MG tablet Take 1 tablet (500 mg total) by mouth 2 (two) times daily with a meal. 03/26/17   Dennard Nip D, MD  Multiple Vitamin (MULTIVITAMIN) tablet Take 1 tablet by mouth daily.    [provider]  omeprazole (PRILOSEC) 40 MG capsule Take 1 capsule (40 mg total) by mouth 2 (two) times daily. 06/11/18   [name], MD  tadalafil (CIALIS) 5 MG tablet Take 1 tablet (5 mg total) by mouth daily as needed for erectile dysfunction. 12/06/17   Debbrah Alar, NP  triamterene-hydrochlorothiazide (MAXZIDE-25) 37.5-25 MG tablet TAKE 1 TABLET BY MOUTH ONCE DAILY 08/12/18   Shelda Pal, DO  Vitamin D, Ergocalciferol, (DRISDOL) 50000 units CAPS capsule Take 1 capsule (50,000 Units total) by mouth every 7 (seven) days. X 12 weeks 01/16/18   [name], MD    Family History Family History  Problem Relation Age of Onset  . Dementia Mother   . Diabetes Mother   . Hypertension Mother   . Heart failure Mother   . Stroke Mother   . Obesity Mother   . Hypertension Other        siblings  . Diabetes Sister   . Cancer Brother        lung cancer smoker  . Heart attack Maternal Grandmother   .  Diabetes Sister   . Pancreatic disease Sister   . Obesity Father   . Colon cancer Neg Hx     Social History Social History   Tobacco Use  . Smoking status: Never Smoker  . Smokeless tobacco: Never Used  Substance Use Topics  . Alcohol use: No  . Drug use: No     Allergies   Hydrocodone and Tramadol   Review of Systems Review of Systems  All other systems reviewed and are negative.    Physical Exam Updated Vital Signs BP (!) 160/95 (BP Location: Left Arm)   Pulse (!) 53   Temp 98.7 F (37.1 C) (Oral)   Resp 18   Ht 1.829 m (6')   Wt 117.9 kg   SpO2 96%   BMI 35.26 kg/m   Physical Exam Vitals signs and nursing note reviewed.  Constitutional:      General: He is not in acute distress.    Appearance: He is well-developed.  HENT:     Head: Normocephalic and atraumatic.     Right Ear: External ear normal.     Left Ear: External ear normal.  Eyes:     General: No scleral icterus.       Right eye: No discharge.        Left eye: No discharge.     Conjunctiva/sclera: Conjunctivae normal.  Neck:     Musculoskeletal: Neck supple.     Trachea: No tracheal deviation.  Cardiovascular:     Rate and Rhythm: Normal rate.  Pulmonary:     Effort: Pulmonary effort is normal. No respiratory distress.     Breath sounds:  No stridor.  Abdominal:     General: There is no distension.  Musculoskeletal:     Lumbar back: He exhibits tenderness.  Skin:    General: Skin is warm and dry.     Findings: No rash.  Neurological:     Mental Status: He is alert.     Cranial Nerves: Cranial nerve deficit: no gross deficits.     Motor: Motor function is intact.     Comments: Full strength and sensation bilateral lower extremities,      ED Treatments / Results  Labs (all labs ordered are listed, but only abnormal results are displayed) Labs Reviewed - No data to display  EKG None  Radiology Dg Lumbar Spine Complete  Result Date: 09/10/2018 CLINICAL DATA:  Lumbago EXAM: LUMBAR SPINE - COMPLETE 4+ VIEW COMPARISON:  August 26, 2015 FINDINGS: Frontal, lateral, spot lumbosacral lateral, and bilateral oblique views were obtained. There are 5 non-rib-bearing lumbar type vertebral bodies. There is no fracture or spondylolisthesis. Schmorl's nodes are noted at several sites, stable. There is no appreciable disc space narrowing. There is no appreciable facet arthropathy. IMPRESSION: No fracture or spondylolisthesis.  No appreciable arthropathy. Electronically Signed   By: Lowella Grip III M.D.   On: 09/10/2018 08:25    Procedures Procedures (including critical care time)  Medications Ordered in ED Medications - No data to display   Initial Impression / Assessment and Plan / ED Course  I have reviewed the triage vital signs and the nursing notes.  Pertinent labs & imaging results that were available during my care of the patient were reviewed by me and considered in my medical decision making (see chart for details).   Patient's x-rays are normal.  Symptoms are consistent with a muscle strain.  Patient has muscle relaxants and takes Tylenol.  He can continue with that regimen.  Final Clinical Impressions(s) / ED Diagnoses   Final diagnoses:  Acute midline low back pain without sciatica    ED Discharge  Orders    None       Dorie Rank, MD 09/10/18 0900

## 2018-09-10 NOTE — Discharge Instructions (Addendum)
Continue your medications as needed for aches and pains

## 2018-09-10 NOTE — ED Triage Notes (Signed)
Pt c/o L low back pain worsening after bending over yesterday. He has already been experiencing back pain from an injury but states the pain became worse after this.

## 2018-09-10 NOTE — ED Notes (Signed)
Patient transported to X-ray 

## 2018-09-12 DIAGNOSIS — M62838 Other muscle spasm: Secondary | ICD-10-CM | POA: Diagnosis not present

## 2018-09-12 DIAGNOSIS — M545 Low back pain: Secondary | ICD-10-CM | POA: Diagnosis not present

## 2018-09-17 DIAGNOSIS — M545 Low back pain: Secondary | ICD-10-CM | POA: Diagnosis not present

## 2018-09-17 DIAGNOSIS — M62838 Other muscle spasm: Secondary | ICD-10-CM | POA: Diagnosis not present

## 2018-09-19 ENCOUNTER — Ambulatory Visit: Payer: Commercial Managed Care - PPO | Admitting: Family Medicine

## 2018-09-19 ENCOUNTER — Encounter: Payer: Self-pay | Admitting: Family Medicine

## 2018-09-19 VITALS — BP 140/90 | HR 96 | Temp 98.2°F | Ht 72.0 in | Wt 260.0 lb

## 2018-09-19 DIAGNOSIS — M545 Low back pain: Secondary | ICD-10-CM | POA: Diagnosis not present

## 2018-09-19 DIAGNOSIS — J069 Acute upper respiratory infection, unspecified: Secondary | ICD-10-CM | POA: Diagnosis not present

## 2018-09-19 DIAGNOSIS — M62838 Other muscle spasm: Secondary | ICD-10-CM | POA: Diagnosis not present

## 2018-09-19 NOTE — Progress Notes (Signed)
Patient ID: [name] Foster, male   DOB: [birth year], 53 y.o.   MRN: [id number]  PCP: [name], MD  Subjective:  [name] Foster Armbrister is a 53 y.o. year old very pleasant male patient who presents with symptoms including mild chest congestion, intermittent cough that is not productive, nasal congestion -started: 2 days ago, symptoms are not worsening -previous treatments: flonase and OTC cough/cold medicine have provided benefit -sick contacts/travel/risks: denies flu exposure. No recent sick contact exposure -Hx of: allergies He has a history of chest pain that has been evaluated by PCP and November 2019 was not associated with exertion. He is followed by cardiology. No chest pain today. He notes this as chest congestion.  ROS-denies fever, SOB, NVD, tooth pain, sore throat  Pertinent Past Medical History- T2DM, HTN, Hiatal hernia with gastroesophageal reflux.  Medications- reviewed  Current Outpatient Medications  Medication Sig Dispense Refill  . acetaminophen (TYLENOL) 500 MG tablet Take 1 tablet (500 mg total) by mouth every 6 (six) hours as needed. 60 tablet 0  . albuterol (PROVENTIL HFA;VENTOLIN HFA) 108 (90 Base) MCG/ACT inhaler Inhale 2 puffs into the lungs every 6 (six) hours as needed for wheezing or shortness of breath. 1 Inhaler 0  . amLODipine (NORVASC) 5 MG tablet Take 1 tablet (5 mg total) by mouth daily. 30 tablet 3  . aspirin EC 81 MG tablet Take 81 mg by mouth daily.    [name] atorvastatin (LIPITOR) 10 MG tablet Take 1 tablet (10 mg total) by mouth at bedtime. 30 tablet 3  . baclofen (LIORESAL) 10 MG tablet Take 0.5-1 tablets (5-10 mg total) by mouth at bedtime as needed for muscle spasms. 30 each 3  . BAYER MICROLET LANCETS lancets Test blood sugars twice daily 100 each 0  . Blood Glucose Monitoring Suppl (CONTOUR NEXT MONITOR) w/Device KIT 1 kit by Does not apply route 2 (two) times daily. 1 kit 0  . diclofenac (VOLTAREN) 75 MG EC tablet Take 1 tablet (75 mg total) by mouth 2  (two) times daily. 50 tablet 2  . etodolac (LODINE) 400 MG tablet Take 1 tablet (400 mg total) by mouth 2 (two) times daily as needed. 60 tablet 3  . fluticasone (FLONASE) 50 MCG/ACT nasal spray Place 2 sprays into both nostrils daily. 16 g 6  . glucose blood (CONTOUR NEXT TEST) test strip Test blood sugars twice daily 100 each 0  . hyoscyamine (LEVSIN SL) 0.125 MG SL tablet Place 1 tablet (0.125 mg total) under the tongue every 4 (four) hours as needed. 30 tablet 5  . ibuprofen (ADVIL,MOTRIN) 600 MG tablet Take 1 tablet (600 mg total) by mouth 3 (three) times daily as needed. 90 tablet 1  . metFORMIN (GLUCOPHAGE) 500 MG tablet Take 1 tablet (500 mg total) by mouth 2 (two) times daily with a meal. 60 tablet 0  . Multiple Vitamin (MULTIVITAMIN) tablet Take 1 tablet by mouth daily.    [name] omeprazole (PRILOSEC) 40 MG capsule Take 1 capsule (40 mg total) by mouth 2 (two) times daily. 180 capsule 1  . tadalafil (CIALIS) 5 MG tablet Take 1 tablet (5 mg total) by mouth daily as needed for erectile dysfunction. 30 tablet 5  . triamterene-hydrochlorothiazide (MAXZIDE-25) 37.5-25 MG tablet TAKE 1 TABLET BY MOUTH ONCE DAILY 90 tablet 1  . Vitamin D, Ergocalciferol, (DRISDOL) 50000 units CAPS capsule Take 1 capsule (50,000 Units total) by mouth every 7 (seven) days. X 12 weeks 4 capsule 4   No current facility-administered medications for this  visit.     Objective: BP 140/90 (BP Location: Left Arm, Patient Position: Sitting, Cuff Size: Large)   Pulse 96   Temp 98.2 F (36.8 C) (Oral)   Ht 6' (1.829 m)   Wt 260 lb 0.6 oz (118 kg)   SpO2 99%   BMI 35.27 kg/m  Gen: NAD, resting comfortably HEENT: Turbinates mildy erythematous, TMs normal bilaterally, oropharynx is clear, moist, post nasal drip present, no edema, no sinus tenderness CV: RRR no murmurs rubs or gallops Lungs: CTAB no crackles, wheeze, rhonchi Ext: no edema Skin: warm, dry, no rash Neuro: grossly normal, moves all  extremities  Assessment/Plan: 1. Acute upper respiratory infection History and exam today are suggestive of viral infection most likely due to upper respiratory infection. Symptomatic treatment with: flonase, mucinex, and delsym if needed. Advised use of salt water gargles and he is currently taking an antiinflammatory which he will continue short term. Further advised avoiding OTC decongestants which can increase BP.   We discussed that we did not find any infection that had higher probability of being bacterial such as pneumonia or strep throat. We discussed signs that bacterial infection may have developed particularly fever or shortness of breath. Likely course of 1-2 weeks. Patient is contagious and advised good handwashing and consideration of mask If going to be in public places.   Finally, we reviewed reasons to return to care including if symptoms worsen or persist or new concerns arise- once again particularly shortness of breath or fever.   [name] Quint, FNP

## 2018-09-19 NOTE — Patient Instructions (Signed)
It was a pleasure to meet you today!  Your symptoms are most likely related to a viral illness. Please drink plenty of water so that your urine is pale yellow or clear. Also, get plenty of rest and follow up if symptoms do not improve in 3 to 4 days, worsen, or you develop a fever >101.  Avoid decongestants as this can increase blood pressure.  Delsym and Mucinex are options also.   Upper Respiratory Infection, Adult An upper respiratory infection (URI) affects the nose, throat, and upper air passages. URIs are caused by germs (viruses). The most common type of URI is often called "the common cold." Medicines cannot cure URIs, but you can do things at home to relieve your symptoms. URIs usually get better within 7-10 days. Follow these instructions at home: Activity  Rest as needed.  If you have a fever, stay home from work or school until your fever is gone, or until your doctor says you may return to work or school. ? You should stay home until you cannot spread the infection anymore (you are not contagious). ? Your doctor may have you wear a face mask so you have less risk of spreading the infection. Relieving symptoms  Gargle with a salt-water mixture 3-4 times a day or as needed. To make a salt-water mixture, completely dissolve -1 tsp of salt in 1 cup of warm water.  Use a cool-mist humidifier to add moisture to the air. This can help you breathe more easily. Eating and drinking   Drink enough fluid to keep your pee (urine) pale yellow.  Eat soups and other clear broths. General instructions   Take over-the-counter and prescription medicines only as told by your doctor. These include cold medicines, fever reducers, and cough suppressants.  Do not use any products that contain nicotine or tobacco. These include cigarettes and e-cigarettes. If you need help quitting, ask your doctor.  Avoid being where people are smoking (avoid secondhand smoke).  Make sure you get  regular shots and get the flu shot every year.  Keep all follow-up visits as told by your doctor. This is important. How to avoid spreading infection to others   Wash your hands often with soap and water. If you do not have soap and water, use hand sanitizer.  Avoid touching your mouth, face, eyes, or nose.  Cough or sneeze into a tissue or your sleeve or elbow. Do not cough or sneeze into your hand or into the air. Contact a doctor if:  You are getting worse, not better.  You have any of these: ? A fever. ? Chills. ? Brown or red mucus in your nose. ? Yellow or brown fluid (discharge)coming from your nose. ? Pain in your face, especially when you bend forward. ? Swollen neck glands. ? Pain with swallowing. ? White areas in the back of your throat. Get help right away if:  You have shortness of breath that gets worse.  You have very bad or constant: ? Headache. ? Ear pain. ? Pain in your forehead, behind your eyes, and over your cheekbones (sinus pain). ? Chest pain.  You have long-lasting (chronic) lung disease along with any of these: ? Wheezing. ? Long-lasting cough. ? Coughing up blood. ? A change in your usual mucus.  You have a stiff neck.  You have changes in your: ? Vision. ? Hearing. ? Thinking. ? Mood. Summary  An upper respiratory infection (URI) is caused by a germ called a virus. The most  common type of URI is often called "the common cold."  URIs usually get better within 7-10 days.  Take over-the-counter and prescription medicines only as told by your doctor. This information is not intended to replace advice given to you by your health care provider. Make sure you discuss any questions you have with your health care provider. Document Released: 02/14/2008 Document Revised: 04/20/2017 Document Reviewed: 04/20/2017 Elsevier Interactive Patient Education  2019 [employer].

## 2018-09-24 DIAGNOSIS — M62838 Other muscle spasm: Secondary | ICD-10-CM | POA: Diagnosis not present

## 2018-09-24 DIAGNOSIS — M545 Low back pain: Secondary | ICD-10-CM | POA: Diagnosis not present

## 2018-09-25 ENCOUNTER — Encounter: Payer: Self-pay | Admitting: Podiatry

## 2018-09-25 ENCOUNTER — Ambulatory Visit: Payer: Commercial Managed Care - PPO | Admitting: Podiatry

## 2018-09-25 DIAGNOSIS — M25571 Pain in right ankle and joints of right foot: Secondary | ICD-10-CM | POA: Diagnosis not present

## 2018-09-25 DIAGNOSIS — M7662 Achilles tendinitis, left leg: Secondary | ICD-10-CM

## 2018-09-25 DIAGNOSIS — M25572 Pain in left ankle and joints of left foot: Secondary | ICD-10-CM | POA: Diagnosis not present

## 2018-09-25 DIAGNOSIS — M7661 Achilles tendinitis, right leg: Secondary | ICD-10-CM | POA: Diagnosis not present

## 2018-09-25 DIAGNOSIS — M25522 Pain in left elbow: Secondary | ICD-10-CM | POA: Diagnosis not present

## 2018-09-25 NOTE — Progress Notes (Signed)
Subjective:   Patient ID: [name] Foster, male   DOB: 53 y.o.   MRN: [id number]   HPI Patient states overall I am doing better with my heels even though they still hurt and I am concerned about pushing on the pedal with the bus as I do believe that would cause my problem to begin with   ROS      Objective:  Physical Exam  Neurovascular status intact with patient's Achilles still tender bilateral with improvement from previous but still tender with patient due to start physical therapy today     Assessment:  Continued Achilles tendinitis bilateral right over left     Plan:  Reviewed condition and we did discuss again that he had started with a new bus that had heavier pedals and that the pain started after this and he is concerned.  Hopefully physical therapy will help to knock out remaining symptoms and I did review with him the importance of trying to reduce the pressure against his heels and hopefully he can be on boxes that do not have as much stress with breaking utilizing the pedal

## 2018-09-26 ENCOUNTER — Encounter: Payer: Self-pay | Admitting: Podiatry

## 2018-09-26 DIAGNOSIS — M545 Low back pain: Secondary | ICD-10-CM | POA: Diagnosis not present

## 2018-09-27 DIAGNOSIS — M25571 Pain in right ankle and joints of right foot: Secondary | ICD-10-CM | POA: Diagnosis not present

## 2018-09-27 DIAGNOSIS — M25572 Pain in left ankle and joints of left foot: Secondary | ICD-10-CM | POA: Diagnosis not present

## 2018-09-27 DIAGNOSIS — M25671 Stiffness of right ankle, not elsewhere classified: Secondary | ICD-10-CM | POA: Diagnosis not present

## 2018-10-02 DIAGNOSIS — M25672 Stiffness of left ankle, not elsewhere classified: Secondary | ICD-10-CM | POA: Diagnosis not present

## 2018-10-02 DIAGNOSIS — M25671 Stiffness of right ankle, not elsewhere classified: Secondary | ICD-10-CM | POA: Diagnosis not present

## 2018-10-02 DIAGNOSIS — M25572 Pain in left ankle and joints of left foot: Secondary | ICD-10-CM | POA: Diagnosis not present

## 2018-10-07 DIAGNOSIS — M25571 Pain in right ankle and joints of right foot: Secondary | ICD-10-CM | POA: Diagnosis not present

## 2018-10-07 DIAGNOSIS — M25671 Stiffness of right ankle, not elsewhere classified: Secondary | ICD-10-CM | POA: Diagnosis not present

## 2018-10-07 DIAGNOSIS — M25572 Pain in left ankle and joints of left foot: Secondary | ICD-10-CM | POA: Diagnosis not present

## 2018-10-08 DIAGNOSIS — M545 Low back pain: Secondary | ICD-10-CM | POA: Diagnosis not present

## 2018-10-09 DIAGNOSIS — M25672 Stiffness of left ankle, not elsewhere classified: Secondary | ICD-10-CM | POA: Diagnosis not present

## 2018-10-09 DIAGNOSIS — M25572 Pain in left ankle and joints of left foot: Secondary | ICD-10-CM | POA: Diagnosis not present

## 2018-10-09 DIAGNOSIS — M25571 Pain in right ankle and joints of right foot: Secondary | ICD-10-CM | POA: Diagnosis not present

## 2018-10-10 ENCOUNTER — Encounter (INDEPENDENT_AMBULATORY_CARE_PROVIDER_SITE_OTHER): Payer: Self-pay | Admitting: Family Medicine

## 2018-10-10 ENCOUNTER — Ambulatory Visit (INDEPENDENT_AMBULATORY_CARE_PROVIDER_SITE_OTHER): Payer: Commercial Managed Care - PPO | Admitting: Family Medicine

## 2018-10-10 DIAGNOSIS — M25511 Pain in right shoulder: Secondary | ICD-10-CM | POA: Diagnosis not present

## 2018-10-10 DIAGNOSIS — M25512 Pain in left shoulder: Secondary | ICD-10-CM | POA: Diagnosis not present

## 2018-10-10 DIAGNOSIS — M545 Low back pain, unspecified: Secondary | ICD-10-CM

## 2018-10-10 DIAGNOSIS — G8929 Other chronic pain: Secondary | ICD-10-CM

## 2018-10-10 MED ORDER — METHYLPREDNISOLONE ACETATE 40 MG/ML IJ SUSP
40.0000 mg | INTRAMUSCULAR | Status: AC | PRN
  Administered 2018-10-10: 40 mg via INTRA_ARTICULAR

## 2018-10-10 MED ORDER — LIDOCAINE HCL 1 % IJ SOLN
5.0000 mL | INTRAMUSCULAR | Status: AC | PRN
  Administered 2018-10-10: 5 mL

## 2018-10-10 NOTE — Progress Notes (Signed)
Office Visit Note   Patient: [name] Foster           Date of Birth: 31-Oct-1965           MRN: [id number] Visit Date: 10/10/2018 Requested by: [name], MD [address] [place], [place] [address] PCP: [name], MD  Subjective: Chief Complaint  Patient presents with  . Right Shoulder - Pain, Follow-up  . Left Shoulder - Pain, Follow-up  . Lower Back - Pain, Follow-up    Better with PT    HPI: He is here for follow-up bilateral shoulder and low back pain related to work.  He has been out of work since last visit and is working with physical therapy mainly on his low back.  His low back pain has improved significantly and he is now doing maintenance exercises.  They have not really been addressing his shoulders, but some of the exercises for his back involve raising his arms overhead and it really seems to bother his shoulders.  They both hurt about equally.              ROS: Noncontributory  Objective: Vital Signs: There were no vitals taken for this visit.  Physical Exam:  Shoulders: Full active range of motion, pain reaching overhead.  Both shoulders are tender in the lateral subacromial space.  Both shoulders have pain with empty can test but strength remains 5/5 throughout with rotator cuff testing.  Imaging: None today.  Assessment & Plan: 1.  Bilateral shoulder pain probably due to impingement -Discussed options with him and he wants to try bilateral subacromial injections followed by home exercises.  If shoulder pain persists, consider MRI scan.  2.  Low back pain, clinically improving -Continue with home exercises and follow-up as needed for this.   Follow-Up Instructions: No follow-ups on file.      Procedures: Large Joint Inj: bilateral subacromial bursa on 10/10/2018 12:27 PM Indications: pain Details: 25 G 1.5 in needle, posterior approach Medications (Right): 5 mL lidocaine 1 %; 40 mg methylPREDNISolone acetate 40  MG/ML Medications (Left): 5 mL lidocaine 1 %; 40 mg methylPREDNISolone acetate 40 MG/ML Consent was given by the patient.      No notes on file    PMFS History: Patient Active Problem List   Diagnosis Date Noted  . Bilateral inguinal hernia without obstruction or gangrene 04/11/2017  . Umbilical hernia without obstruction and without gangrene 04/11/2017  . Vitamin D deficiency 01/24/2017  . Type 2 diabetes mellitus without complication, without long-term current use of insulin ([place]) 01/24/2017  . Depression 01/24/2017  . Shortness of breath on exertion 11/27/2016  . Internal hemorrhoids   . Atypical chest pain   . Gastroesophageal reflux disease without esophagitis   . Dyspnea 01/18/2016  . Preventative health care 10/10/2015  . Sleep apnea 10/10/2015  . Urinary hesitancy 09/30/2015  . Fatty liver 07/04/2015  . Edema 05/07/2014  . Scleroderma of esophagus ([place]) 03/20/2014  . Hiatal hernia with gastroesophageal reflux 03/20/2014  . Diabetes mellitus type 2 in obese ([place]) 07/13/2013  . Anxiety state, unspecified 07/13/2013  . Erectile dysfunction 01/15/2013  . Esophageal reflux 01/15/2013  . Substernal chest pain 07/23/2012  . Back pain 03/09/2012  . Mesenteric panniculitis ([facility]) 02/06/2012  . Heel pain, bilateral 04/25/2010  . Obesity 01/05/2010  . Hyperlipidemia 10/04/2009  . Essential hypertension 10/04/2009   Past Medical History:  Diagnosis Date  . Bone spur    left foot  .  Chest pain   . Diabetes mellitus type 2 in obese ([place]) 07/13/2013  . Diverticulosis   . Erectile dysfunction 01/15/2013  . Esophageal reflux 01/15/2013  . Fatty liver 07/04/2015  . GERD (gastroesophageal reflux disease)   . Hiatal hernia   . Hiatal hernia with gastroesophageal reflux 03/20/2014  . Hyperglycemia 07/13/2013  . Hyperplastic colon polyp   . Hypertension   . Internal hemorrhoids   . Mixed hyperlipidemia   . Murmur   . OSA (obstructive sleep apnea)    s/p UPPP  . Plantar  fasciitis   . Prediabetes   . Reflux esophagitis   . Stomach ulcer    from PCP  . Urinary hesitancy 09/30/2015    Family History  Problem Relation Age of Onset  . Dementia Mother   . Diabetes Mother   . Hypertension Mother   . Heart failure Mother   . Stroke Mother   . Obesity Mother   . Hypertension Other        siblings  . Diabetes Sister   . Cancer Brother        lung cancer smoker  . Heart attack Maternal Grandmother   . Diabetes Sister   . Pancreatic disease Sister   . Obesity Father   . Colon cancer Neg Hx     Past Surgical History:  Procedure Laterality Date  . [place] STUDY N/A 02/28/2016   Procedure: [place] STUDY;  Surgeon: Jerene Bears, MD;  Location: [facility];  Service: Gastroenterology;  Laterality: N/A;  . ESOPHAGEAL MANOMETRY N/A 09/01/2013   Procedure: ESOPHAGEAL MANOMETRY (EM);  Surgeon: Sable Feil, MD;  Location: [facility];  Service: Endoscopy;  Laterality: N/A;  . ESOPHAGEAL MANOMETRY N/A 02/28/2016   Procedure: ESOPHAGEAL MANOMETRY (EM);  Surgeon: Jerene Bears, MD;  Location: [facility];  Service: Gastroenterology;  Laterality: N/A;  . TONSILLECTOMY    . UVULECTOMY     Social History   Occupational History  . Occupation: Nature conservation officer: [employer]  Tobacco Use  . Smoking status: Never Smoker  . Smokeless tobacco: Never Used  Substance and Sexual Activity  . Alcohol use: No  . Drug use: No  . Sexual activity: Yes    Partners: Female

## 2018-10-14 ENCOUNTER — Encounter: Payer: Self-pay | Admitting: Family Medicine

## 2018-10-14 ENCOUNTER — Ambulatory Visit: Payer: Commercial Managed Care - PPO | Admitting: Family Medicine

## 2018-10-14 VITALS — BP 122/82 | HR 52 | Temp 97.4°F | Resp 18 | Ht 72.0 in | Wt 260.8 lb

## 2018-10-14 DIAGNOSIS — E1169 Type 2 diabetes mellitus with other specified complication: Secondary | ICD-10-CM | POA: Diagnosis not present

## 2018-10-14 DIAGNOSIS — E119 Type 2 diabetes mellitus without complications: Secondary | ICD-10-CM

## 2018-10-14 DIAGNOSIS — E785 Hyperlipidemia, unspecified: Secondary | ICD-10-CM | POA: Diagnosis not present

## 2018-10-14 DIAGNOSIS — R109 Unspecified abdominal pain: Secondary | ICD-10-CM

## 2018-10-14 DIAGNOSIS — R700 Elevated erythrocyte sedimentation rate: Secondary | ICD-10-CM | POA: Diagnosis not present

## 2018-10-14 DIAGNOSIS — E669 Obesity, unspecified: Secondary | ICD-10-CM

## 2018-10-14 DIAGNOSIS — I10 Essential (primary) hypertension: Secondary | ICD-10-CM | POA: Diagnosis not present

## 2018-10-14 DIAGNOSIS — K219 Gastro-esophageal reflux disease without esophagitis: Secondary | ICD-10-CM

## 2018-10-14 DIAGNOSIS — M545 Low back pain, unspecified: Secondary | ICD-10-CM

## 2018-10-14 DIAGNOSIS — E559 Vitamin D deficiency, unspecified: Secondary | ICD-10-CM | POA: Diagnosis not present

## 2018-10-14 DIAGNOSIS — R0789 Other chest pain: Secondary | ICD-10-CM

## 2018-10-14 LAB — COMPREHENSIVE METABOLIC PANEL
ALT: 41 U/L (ref 0–53)
AST: 25 U/L (ref 0–37)
Albumin: 4.7 g/dL (ref 3.5–5.2)
Alkaline Phosphatase: 73 U/L (ref 39–117)
BUN: 15 mg/dL (ref 6–23)
CO2: 26 mEq/L (ref 19–32)
Calcium: 9.8 mg/dL (ref 8.4–10.5)
Chloride: 102 mEq/L (ref 96–112)
Creatinine, Ser: 0.91 mg/dL (ref 0.40–1.50)
GFR: 106.29 mL/min (ref >60.00)
Glucose, Bld: 85 mg/dL (ref 70–99)
Potassium: 3.8 mEq/L (ref 3.5–5.1)
Sodium: 139 mEq/L (ref 135–145)
Total Bilirubin: 0.5 mg/dL (ref 0.2–1.2)
Total Protein: 7.7 g/dL (ref 6.0–8.3)

## 2018-10-14 LAB — CBC
HCT: 48.8 % (ref 39.0–52.0)
Hemoglobin: 16.2 g/dL (ref 13.0–17.0)
MCHC: 33.2 g/dL (ref 30.0–36.0)
MCV: 87 fl (ref 78.0–100.0)
Platelets: 248 10*3/uL (ref 150.0–400.0)
RBC: 5.61 Mil/uL (ref 4.22–5.81)
RDW: 14.1 % (ref 11.5–15.5)
WBC: 6.4 10*3/uL (ref 4.0–10.5)

## 2018-10-14 LAB — LIPID PANEL
Cholesterol: 207 mg/dL — ABNORMAL HIGH (ref 0–200)
HDL: 47.2 mg/dL (ref >39.00)
LDL Cholesterol: 148 mg/dL — ABNORMAL HIGH (ref 0–99)
NonHDL: 159.93
TRIGLYCERIDES: 58 mg/dL (ref 0.0–149.0)
Total CHOL/HDL Ratio: 4
VLDL: 11.6 mg/dL (ref 0.0–40.0)

## 2018-10-14 LAB — TSH: TSH: 1.54 u[IU]/mL (ref 0.35–4.50)

## 2018-10-14 LAB — HEMOGLOBIN A1C: Hgb A1c MFr Bld: 6.3 % (ref 4.6–6.5)

## 2018-10-14 LAB — VITAMIN D 25 HYDROXY (VIT D DEFICIENCY, FRACTURES): VITD: 44.29 ng/mL (ref 30.00–100.00)

## 2018-10-14 LAB — SEDIMENTATION RATE: Sed Rate: 23 mm/hr — ABNORMAL HIGH (ref 0–20)

## 2018-10-14 LAB — H. PYLORI ANTIBODY, IGG: H Pylori IgG: NEGATIVE

## 2018-10-14 MED ORDER — FAMOTIDINE 40 MG PO TABS: 40.0000 mg | ORAL_TABLET | Freq: Every day | ORAL | 4 refills | Status: DC

## 2018-10-14 NOTE — Assessment & Plan Note (Addendum)
Struggling with epigastric pain after eating. Partially treated with Omeprazole. H pylori negative. Add Famotidine qhs and if no improvement then will need to return to Gastroenterology for evaluation

## 2018-10-14 NOTE — Patient Instructions (Addendum)
Shingrix is the new shingles vaccine; 2 shots over 2-9mo period. You can ask for it at the pharmacy. Call insurance and confirm coverage and call for nurse   Helicobacter Pylori Infection Helicobacter pylori infection is a bacterial infection in the stomach. Long-term (chronic) infection can cause stomach irritation (gastritis), ulcers in the stomach (gastric ulcers), and ulcers in the upper part of the intestine (duodenal ulcers). Having this infection may also increase your risk of stomach cancer and a type of white blood cell cancer (lymphoma) that affects the stomach. What are the causes? This infection is caused by the Helicobacter pylori (H. pylori) bacteria. Many healthy people have this bacteria in their stomach lining. The bacteria may also spread from person to person through contact with stool (feces) or saliva. It is not known why some people develop ulcers, gastritis, or cancer from the bacteria. What increases the risk? You are more likely to develop this condition if you:  Have family members with the infection.  Live with many other people, such as in a dormitory.  Are of African, Hispanic, or Asian descent. What are the signs or symptoms? Most people with this infection do not have any symptoms. If you do have symptoms, they may include:  Heartburn.  Stomach pain.  Nausea.  Vomiting. The vomit may be bloody because of ulcers.  Loss of appetite.  Bad breath. How is this diagnosed? This condition may be diagnosed based on:  Your symptoms and medical history.  A physical exam.  Blood tests.  Stool tests.  A breath test.  A procedure that involves placing a tube with a camera on the end of it down your throat to examine your stomach and upper intestine (upper endoscopy).  Removing and testing a tissue sample from the stomach lining (biopsy). A biopsy may be taken during an upper endoscopy. How is this treated?  This condition is treated by taking a  combination of medicines (triple therapy) for several weeks. Triple therapy includes one medicine to reduce the amount of acid in your stomach and two types of antibiotic medicines. This treatment may reduce your risk of cancer. You may need to be tested for H. pylori again after treatment. In some cases, the treatment may need to be repeated if your treatment did not get rid of all the bacteria. Follow these instructions at home:   Take over-the-counter and prescription medicines only as told by your health care provider.  Take your antibiotics as told by your health care provider. Do not stop taking the antibiotics even if you start to feel better.  Return to your normal activities as told by your health care provider. Ask your health care provider what activities are safe for you.  Take steps to prevent future infections: ? Wash your hands often with soap and water. If soap and water are not available, use hand sanitizer. ? Do not eat food or drink water that may have had contact with stool or saliva.  Keep all follow-up visits as told by your health care provider. This is important. You may need tests to make sure your treatment worked. Contact a health care provider if your symptoms:  Do not get better with treatment.  Return after treatment. Summary  Helicobacter pylori infection is a stomach infection caused by the Helicobacter pylori (H. pylori) bacteria.  This infection can cause stomach irritation (gastritis), ulcers in the stomach (gastric ulcers), and ulcers in the upper part of the intestine (duodenal ulcers).  This condition is treated  by taking a combination of medicines (triple therapy) for several weeks.  Take your antibiotics as told by your health care provider. Do not stop taking the antibiotics even if you start to feel better. This information is not intended to replace advice given to you by your health care provider. Make sure you discuss any questions you have  with your health care provider. Document Released: 12/20/2015 Document Revised: 08/21/2017 Document Reviewed: 08/21/2017 Elsevier Interactive Patient Education  2019 [employer].

## 2018-10-14 NOTE — Assessment & Plan Note (Signed)
hgba1c acceptable, minimize simple carbs. Increase exercise as tolerated. Continue current meds 

## 2018-10-14 NOTE — Assessment & Plan Note (Signed)
Well controlled, no changes to meds. Encouraged heart healthy diet such as the DASH diet and exercise as tolerated.  °

## 2018-10-14 NOTE — Progress Notes (Signed)
Subjective:    Patient ID: [name] Foster, male    DOB: 1965/12/03, 53 y.o.   MRN: [id number]  No chief complaint on file.   HPI  Patient is in today for a four month follow up visit. He is doing well. Pt went to the ER at the end of December for acute back pain, showing no structural abnormalities on X-ray which is now managed with PT and he is recovering well. Pt also has some complaint of epigastric pain/pressure, which he was told by his gastroenterologist is most likely GERD given the symptomatology. It is usually after meals, exacerbated by spicy foods and not with exertion. Pt is not taking his metformin and would like some counseling on whether it is safe to use.  Patient Care Team: [name], MD as PCP - General (Family Medicine) Sable Feil, MD as Referring Physician (Gastroenterology)   Past Medical History:  Diagnosis Date  . Bone spur    left foot  . Chest pain   . Diabetes mellitus type 2 in obese ([place]) 07/13/2013  . Diverticulosis   . Erectile dysfunction 01/15/2013  . Esophageal reflux 01/15/2013  . Fatty liver 07/04/2015  . GERD (gastroesophageal reflux disease)   . Hiatal hernia   . Hiatal hernia with gastroesophageal reflux 03/20/2014  . Hyperglycemia 07/13/2013  . Hyperplastic colon polyp   . Hypertension   . Internal hemorrhoids   . Mixed hyperlipidemia   . Murmur   . OSA (obstructive sleep apnea)    s/p UPPP  . Plantar fasciitis   . Prediabetes   . Reflux esophagitis   . Stomach ulcer    from PCP  . Urinary hesitancy 09/30/2015    Past Surgical History:  Procedure Laterality Date  . [place] STUDY N/A 02/28/2016   Procedure: [place] STUDY;  Surgeon: Jerene Bears, MD;  [facility];  Service: Gastroenterology;  Laterality: N/A;  . ESOPHAGEAL MANOMETRY N/A 09/01/2013   Procedure: ESOPHAGEAL MANOMETRY (EM);  Surgeon: Sable Feil, MD;  [facility];  Service: Endoscopy;  Laterality: N/A;  . ESOPHAGEAL MANOMETRY N/A  02/28/2016   Procedure: ESOPHAGEAL MANOMETRY (EM);  Surgeon: Jerene Bears, MD;  [facility];  Service: Gastroenterology;  Laterality: N/A;  . TONSILLECTOMY    . UVULECTOMY      Family History  Problem Relation Age of Onset  . Dementia Mother   . Diabetes Mother   . Hypertension Mother   . Heart failure Mother   . Stroke Mother   . Obesity Mother   . Hypertension Other        siblings  . Diabetes Sister   . Cancer Brother        lung cancer smoker  . Heart attack Maternal Grandmother   . Diabetes Sister   . Pancreatic disease Sister   . Obesity Father   . Colon cancer Neg Hx     Social History   Socioeconomic History  . Marital status: Married    Spouse name: [name]  . Number of children: Not on file  . Years of education: Not on file  . Highest education level: Not on file  Occupational History  . Occupation: Nature conservation officer: [employer]  . Financial resource strain: Not on file  . Food insecurity:    Worry: Not on file    Inability: Not on file  . Transportation needs:    Medical: Not on file  Non-medical: Not on file  Tobacco Use  . Smoking status: Never Smoker  . Smokeless tobacco: Never Used  Substance and Sexual Activity  . Alcohol use: No  . Drug use: No  . Sexual activity: Yes    Partners: Female  Lifestyle  . Physical activity:    Days per week: Not on file    Minutes per session: Not on file  . Stress: Not on file  Relationships  . Social connections:    Talks on phone: Not on file    Gets together: Not on file    Attends religious service: Not on file    Active member of club or organization: Not on file    Attends meetings of clubs or organizations: Not on file    Relationship status: Not on file  . Intimate partner violence:    Fear of current or ex partner: Not on file    Emotionally abused: Not on file    Physically abused: Not on file    Forced sexual activity: Not on file  Other Topics  Concern  . Not on file  Social History Narrative   Tobacco Use - No.    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 acetaminophen (TYLENOL) 500 MG tablet Take 1 tablet (500 mg total) by mouth every 6 (six) hours as needed. 60 tablet 0  . albuterol (PROVENTIL HFA;VENTOLIN HFA) 108 (90 Base) MCG/ACT inhaler Inhale 2 puffs into the lungs every 6 (six) hours as needed for wheezing or shortness of breath. 1 Inhaler 0  . amLODipine (NORVASC) 5 MG tablet Take 1 tablet (5 mg total) by mouth daily. 30 tablet 3  . aspirin EC 81 MG tablet Take 81 mg by mouth daily.    [name] atorvastatin (LIPITOR) 10 MG tablet Take 1 tablet (10 mg total) by mouth at bedtime. 30 tablet 3  . baclofen (LIORESAL) 10 MG tablet Take 0.5-1 tablets (5-10 mg total) by mouth at bedtime as needed for muscle spasms. 30 each 3  . BAYER MICROLET LANCETS lancets Test blood sugars twice daily 100 each 0  . Blood Glucose Monitoring Suppl (CONTOUR NEXT MONITOR) w/Device KIT 1 kit by Does not apply route 2 (two) times daily. 1 kit 0  . diclofenac (VOLTAREN) 75 MG EC tablet Take 1 tablet (75 mg total) by mouth 2 (two) times daily. (Patient not taking: Reported on 10/10/2018) 50 tablet 2  . etodolac (LODINE) 400 MG tablet Take 1 tablet (400 mg total) by mouth 2 (two) times daily as needed. (Patient not taking: Reported on 10/10/2018) 60 tablet 3  . fluticasone (FLONASE) 50 MCG/ACT nasal spray Place 2 sprays into both nostrils daily. 16 g 6  . glucose blood (CONTOUR NEXT TEST) test strip Test blood sugars twice daily 100 each 0  . hyoscyamine (LEVSIN SL) 0.125 MG SL tablet Place 1 tablet (0.125 mg total) under the  tongue every 4 (four) hours as needed. 30 tablet 5  . ibuprofen (ADVIL,MOTRIN) 600 MG tablet Take 1 tablet (  600 mg total) by mouth 3 (three) times daily as needed. 90 tablet 1  . metFORMIN (GLUCOPHAGE) 500 MG tablet Take 1 tablet (500 mg total) by mouth 2 (two) times daily with a meal. 60 tablet 0  . Multiple Vitamin (MULTIVITAMIN) tablet Take 1 tablet by mouth daily.    [name] omeprazole (PRILOSEC) 40 MG capsule Take 1 capsule (40 mg total) by mouth 2 (two) times daily. 180 capsule 1  . tadalafil (CIALIS) 5 MG tablet Take 1 tablet (5 mg total) by mouth daily as needed for erectile dysfunction. 30 tablet 5  . triamterene-hydrochlorothiazide (MAXZIDE-25) 37.5-25 MG tablet TAKE 1 TABLET BY MOUTH ONCE DAILY 90 tablet 1  . Vitamin D, Ergocalciferol, (DRISDOL) 50000 units CAPS capsule Take 1 capsule (50,000 Units total) by mouth every 7 (seven) days. X 12 weeks 4 capsule 4   No facility-administered medications prior to visit.     Allergies  Allergen Reactions  . Hydrocodone Itching  . Tramadol Itching    Review of Systems  Constitutional: Negative for chills, fever, malaise/fatigue and weight loss.  HENT: Negative for hearing loss and tinnitus.   Eyes: Negative for blurred vision, double vision and photophobia.  Respiratory: Negative for shortness of breath.   Cardiovascular: Negative for chest pain and palpitations.  Gastrointestinal: Positive for abdominal pain (epigastric pain/pressure). Negative for constipation and diarrhea.  Genitourinary: Negative for dysuria, frequency and urgency.  Neurological: Negative for dizziness and headaches.       Objective:     [name] Foster appears his stated age, WDWN, and in no acute distress.  Physical Exam Constitutional:      General: He is not in acute distress.    Appearance: Normal appearance.  HENT:     Head: Normocephalic and atraumatic.     Right Ear: Tympanic membrane normal.     Left Ear: Tympanic membrane normal.     Nose: Nose normal.       Mouth/Throat:     Mouth: Mucous membranes are moist.     Pharynx: Oropharynx is clear.  Cardiovascular:     Rate and Rhythm: Normal rate and regular rhythm.     Heart sounds: Normal heart sounds.  Pulmonary:     Effort: Pulmonary effort is normal.     Breath sounds: Normal breath sounds.  Abdominal:     General: Bowel sounds are normal.  Neurological:     Mental Status: He is alert and oriented to person, place, and time.  Psychiatric:        Mood and Affect: Mood normal.        Behavior: Behavior normal.     BP 122/82 (BP Location: Left Arm, Patient Position: Sitting, Cuff Size: Normal)   Pulse (!) 52   Temp (!) 97.4 F (36.3 C) (Oral)   Resp 18   Ht 6' (1.829 m)   Wt 118.3 kg   SpO2 98%   BMI 35.37 kg/m  Wt Readings from Last 3 Encounters:  10/14/18 118.3 kg  09/19/18 118 kg  09/10/18 117.9 kg   BP Readings from Last 3 Encounters:  10/14/18 122/82  09/19/18 140/90  09/10/18 (!) 160/95     Immunization History  Administered Date(s) Administered  . Influenza Whole 09/15/2011  . Influenza,inj,Quad PF,6+ Mos 06/26/2014, 09/30/2015, 06/11/2018  . Pneumococcal Conjugate-13 06/11/2018  . Td 01/01/2007  . Tdap 06/26/2014    Health Maintenance  Topic Date Due  . PNEUMOCOCCAL POLYSACCHARIDE VACCINE AGE 14-64 HIGH RISK  10/24/1969  . FOOT EXAM  10/24/1977  .  OPHTHALMOLOGY EXAM  10/24/1977  . URINE MICROALBUMIN  11/27/2017  . HEMOGLOBIN A1C  07/13/2018  . TETANUS/TDAP  06/26/2024  . COLONOSCOPY  01/30/2026  . INFLUENZA VACCINE  Completed  . HIV Screening  Completed    Lab Results  Component Value Date   WBC 4.7 01/10/2018   HGB 16.1 01/10/2018   HCT 47.5 01/10/2018   PLT 196.0 01/10/2018   GLUCOSE 149 (H) 01/10/2018   CHOL 235 (H) 01/10/2018   TRIG 230.0 (H) 01/10/2018   HDL 43.50 01/10/2018   LDLDIRECT 162.0 01/10/2018   LDLCALC 112 (H) 11/27/2016   ALT 38 01/10/2018   AST 25 01/10/2018   NA 137 01/10/2018   K 3.8 01/10/2018   CL 103 01/10/2018    CREATININE 1.12 01/10/2018   BUN 18 01/10/2018   CO2 22 01/10/2018   TSH 1.76 01/10/2018   PSA 2.39 09/21/2016   HGBA1C 6.1 01/10/2018    Lab Results  Component Value Date   TSH 1.76 01/10/2018   Lab Results  Component Value Date   WBC 4.7 01/10/2018   HGB 16.1 01/10/2018   HCT 47.5 01/10/2018   MCV 86.5 01/10/2018   PLT 196.0 01/10/2018   Lab Results  Component Value Date   NA 137 01/10/2018   K 3.8 01/10/2018   CO2 22 01/10/2018   GLUCOSE 149 (H) 01/10/2018   BUN 18 01/10/2018   CREATININE 1.12 01/10/2018   BILITOT 0.3 01/10/2018   ALKPHOS 80 01/10/2018   AST 25 01/10/2018   ALT 38 01/10/2018   PROT 7.7 01/10/2018   ALBUMIN 4.5 01/10/2018   CALCIUM 10.0 01/10/2018   GFR 89.17 01/10/2018   Lab Results  Component Value Date   CHOL 235 (H) 01/10/2018   Lab Results  Component Value Date   HDL 43.50 01/10/2018   Lab Results  Component Value Date   LDLCALC 112 (H) 11/27/2016   Lab Results  Component Value Date   TRIG 230.0 (H) 01/10/2018   Lab Results  Component Value Date   CHOLHDL 5 01/10/2018   Lab Results  Component Value Date   HGBA1C 6.1 01/10/2018         Assessment & Plan:   Problems Addressed This Visit  Hypertension: well controlled. Pt is taking pressures at home and is finding they stay around 120-30s/80s. Encouraged heart healthy diet and exercise. - orders: CBC, CMP, TSH  Epigastric Pain: Pt having pain/pressure after eating and on extremely empty stomach in his epigastric region. He states that another doctor told him it was GERD. This is very possible, but with pt on omeprazole twice daily, it is necessary to investigate further. Check H. Pylori antigen test today. - orders: H. Pylori antigen; famotidine 44m daily  Elevated sed rate: recent labs showed and elevated sed rate. Recheck today. - orders: sed rate  Vit D Deficiency: Recent labs showed deficiency. Pt was started on cholecalciferol 50000 iu weekly and has been  taking it as prescribed. Recheck vit D today. - orders: Vit D  Back pain: pt had acute back pain that led him to ER. He is doing better and recovering with PT. He has no acute complaint today in this area.  Hyperlipidemia: Pt tolerating statin well, continue to monitor lipid levels. Encouraged heart healthy diet and exercise as tolerated. - orders: lipid panel  DM type 2: Well controlled. Pt has not been taking metformin but states sugars at home has been between 104-124. Recheck A1c today. Encouraged pt that metformin is a very  useful and helpful drug that is worth taking. Will wait until new labs to make recommendation on necessity of metformin. - orders: HgB A1c  I am having Maahir E. Spong maintain his aspirin EC, multivitamin, acetaminophen, CONTOUR NEXT MONITOR, metFORMIN, glucose blood, BAYER MICROLET LANCETS, albuterol, tadalafil, Vitamin D (Ergocalciferol), hyoscyamine, atorvastatin, omeprazole, ibuprofen, fluticasone, amLODipine, diclofenac, triamterene-hydrochlorothiazide, etodolac, and baclofen.  No orders of the defined types were placed in this encounter.   Court Joy, Student-PA

## 2018-10-16 DIAGNOSIS — M25572 Pain in left ankle and joints of left foot: Secondary | ICD-10-CM | POA: Diagnosis not present

## 2018-10-16 DIAGNOSIS — M25672 Stiffness of left ankle, not elsewhere classified: Secondary | ICD-10-CM | POA: Diagnosis not present

## 2018-10-16 DIAGNOSIS — M25571 Pain in right ankle and joints of right foot: Secondary | ICD-10-CM | POA: Diagnosis not present

## 2018-10-16 LAB — INSULIN, RANDOM: Insulin: 47.3 u[IU]/mL — ABNORMAL HIGH (ref 2.0–19.6)

## 2018-10-17 ENCOUNTER — Telehealth: Payer: Self-pay | Admitting: *Deleted

## 2018-10-17 ENCOUNTER — Encounter: Payer: Self-pay | Admitting: *Deleted

## 2018-10-17 NOTE — Telephone Encounter (Signed)
Pt states he is not able to return to work at the [employer] listed return to work date, he has not completed his therapy and his feet still hurt. I told pt I would be able to write him out until reevaluated by DR. Regal. Pt is scheduled for 11/08/2018. Routed message to J. [employer] - FMLA/Disability Agent to fax to pt's employer/FMLA Agency.

## 2018-10-20 NOTE — Assessment & Plan Note (Signed)
Follows with Dr Debara Pickett of cardiology and thus far work up has been negative for cardiac cause.

## 2018-10-20 NOTE — Assessment & Plan Note (Signed)
Encouraged moist heat and gentle stretching as tolerated. May try NSAIDs and prescription meds as directed and report if symptoms worsen or seek immediate care. Had a recent ER trip due to pain. Is now doing better with help of PT

## 2018-10-20 NOTE — Assessment & Plan Note (Signed)
Supplement and monitor 

## 2018-10-20 NOTE — Assessment & Plan Note (Signed)
hgba1c acceptable, minimize simple carbs. Increase exercise as tolerated.  

## 2018-10-20 NOTE — Progress Notes (Signed)
Subjective:    Patient ID: [name] Foster, male    DOB: December 08, 1965, 53 y.o.   MRN: [id number]  No chief complaint on file.   HPI Patient is in today for follow up and he notes a recent trip to ER for back pain. He is now in physical therapy and his back pain is much better. He continues to have intermittent epigastric pain and bad reflux although he has none today. No recent change in bowels or febrile illness. Denies CP/palp/SOB/HA/congestion/fevers or GU c/o. Taking meds as prescribed  Past Medical History:  Diagnosis Date  . Bone spur    left foot  . Chest pain   . Diabetes mellitus type 2 in obese ([place]) 07/13/2013  . Diverticulosis   . Erectile dysfunction 01/15/2013  . Esophageal reflux 01/15/2013  . Fatty liver 07/04/2015  . GERD (gastroesophageal reflux disease)   . Hiatal hernia   . Hiatal hernia with gastroesophageal reflux 03/20/2014  . Hyperglycemia 07/13/2013  . Hyperplastic colon polyp   . Hypertension   . Internal hemorrhoids   . Mixed hyperlipidemia   . Murmur   . OSA (obstructive sleep apnea)    s/p UPPP  . Plantar fasciitis   . Prediabetes   . Reflux esophagitis   . Stomach ulcer    from PCP  . Urinary hesitancy 09/30/2015    Past Surgical History:  Procedure Laterality Date  . [place] STUDY N/A 02/28/2016   Procedure: [place] STUDY;  Surgeon: Jerene Bears, MD;  Location: [facility];  Service: Gastroenterology;  Laterality: N/A;  . ESOPHAGEAL MANOMETRY N/A 09/01/2013   Procedure: ESOPHAGEAL MANOMETRY (EM);  Surgeon: Sable Feil, MD;  Location: [facility];  Service: Endoscopy;  Laterality: N/A;  . ESOPHAGEAL MANOMETRY N/A 02/28/2016   Procedure: ESOPHAGEAL MANOMETRY (EM);  Surgeon: Jerene Bears, MD;  Location: [facility];  Service: Gastroenterology;  Laterality: N/A;  . TONSILLECTOMY    . UVULECTOMY      Family History  Problem Relation Age of Onset  . Dementia Mother   . Diabetes Mother   . Hypertension Mother   . Heart failure Mother    . Stroke Mother   . Obesity Mother   . Hypertension Other        siblings  . Diabetes Sister   . Cancer Brother        lung cancer smoker  . Heart attack Maternal Grandmother   . Diabetes Sister   . Pancreatic disease Sister   . Obesity Father   . Colon cancer Neg Hx     Social History   Socioeconomic History  . Marital status: Married    Spouse name: [name]  . Number of children: Not on file  . Years of education: Not on file  . Highest education level: Not on file  Occupational History  . Occupation: Nature conservation officer: [employer]  . Financial resource strain: Not on file  . Food insecurity:    Worry: Not on file    Inability: Not on file  . Transportation needs:    Medical: Not on file    Non-medical: Not on file  Tobacco Use  . Smoking status: Never Smoker  . Smokeless tobacco: Never Used  Substance and Sexual Activity  . Alcohol use: No  . Drug use: No  . Sexual activity: Yes    Partners: Female  Lifestyle  . Physical activity:    Days per week: Not  on file    Minutes per session: Not on file  . Stress: Not on file  Relationships  . Social connections:    Talks on phone: Not on file    Gets together: Not on file    Attends religious service: Not on file    Active member of club or organization: Not on file    Attends meetings of clubs or organizations: Not on file    Relationship status: Not on file  . Intimate partner violence:    Fear of current or ex partner: Not on file    Emotionally abused: Not on file    Physically abused: Not on file    Forced sexual activity: Not on file  Other Topics Concern  . Not on file  Social History Narrative   Tobacco Use - No.    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 acetaminophen (TYLENOL) 500 MG tablet Take 1 tablet (500 mg total) by mouth every 6 (six) hours as needed. 60 tablet 0  . albuterol (PROVENTIL HFA;VENTOLIN HFA) 108 (90 Base) MCG/ACT inhaler Inhale 2 puffs into the lungs every 6 (six) hours as needed for wheezing or shortness of breath. 1 Inhaler 0  . amLODipine (NORVASC) 5 MG tablet Take 1 tablet (5 mg total) by mouth daily. 30 tablet 3  . aspirin EC 81 MG tablet Take 81 mg by mouth daily.    [name] atorvastatin (LIPITOR) 10 MG tablet Take 1 tablet (10 mg total) by mouth at bedtime. 30 tablet 3  . baclofen (LIORESAL) 10 MG tablet Take 0.5-1 tablets (5-10 mg total) by mouth at bedtime as needed for muscle spasms. 30 each 3  . BAYER MICROLET LANCETS lancets Test blood sugars twice daily 100 each 0  . Blood Glucose Monitoring Suppl (CONTOUR NEXT MONITOR) w/Device KIT 1 kit by Does not apply route 2 (two) times daily. 1 kit 0  . diclofenac (VOLTAREN) 75 MG EC tablet Take 1 tablet (75 mg total) by mouth 2 (two) times daily. (Patient not taking: Reported on 10/10/2018) 50 tablet 2  . etodolac (LODINE) 400 MG tablet Take 1 tablet (400 mg total) by mouth 2 (two) times daily as needed. (Patient not taking: Reported on 10/10/2018) 60 tablet 3  . fluticasone (FLONASE) 50 MCG/ACT nasal spray Place 2 sprays into both nostrils daily. 16 g 6  . glucose blood (CONTOUR NEXT TEST) test strip Test blood sugars twice daily 100 each 0  . hyoscyamine (LEVSIN SL) 0.125 MG SL tablet Place 1 tablet (0.125 mg total) under the tongue every 4 (four) hours as needed. 30 tablet 5  . ibuprofen (ADVIL,MOTRIN) 600 MG tablet Take 1 tablet (600 mg total) by mouth 3 (three) times daily as needed. 90 tablet 1  . metFORMIN (GLUCOPHAGE) 500 MG tablet Take 1 tablet (500 mg total) by mouth 2 (two) times daily with a meal. 60 tablet 0  . Multiple Vitamin  (MULTIVITAMIN) tablet Take 1 tablet by mouth daily.    [name] omeprazole (PRILOSEC)  40 MG capsule Take 1 capsule (40 mg total) by mouth 2 (two) times daily. 180 capsule 1  . tadalafil (CIALIS) 5 MG tablet Take 1 tablet (5 mg total) by mouth daily as needed for erectile dysfunction. 30 tablet 5  . triamterene-hydrochlorothiazide (MAXZIDE-25) 37.5-25 MG tablet TAKE 1 TABLET BY MOUTH ONCE DAILY 90 tablet 1  . Vitamin D, Ergocalciferol, (DRISDOL) 50000 units CAPS capsule Take 1 capsule (50,000 Units total) by mouth every 7 (seven) days. X 12 weeks 4 capsule 4   No facility-administered medications prior to visit.     Allergies  Allergen Reactions  . Hydrocodone Itching  . Tramadol Itching    Review of Systems  Constitutional: Negative for fever and malaise/fatigue.  HENT: Negative for congestion.   Eyes: Negative for blurred vision.  Respiratory: Negative for shortness of breath.   Cardiovascular: Negative for chest pain, palpitations and leg swelling.  Gastrointestinal: Positive for abdominal pain and heartburn. Negative for blood in stool and nausea.  Genitourinary: Negative for dysuria and frequency.  Musculoskeletal: Negative for falls.  Skin: Negative for rash.  Neurological: Negative for dizziness, loss of consciousness and headaches.  Endo/Heme/Allergies: Negative for environmental allergies.  Psychiatric/Behavioral: Negative for depression. The patient is not nervous/anxious.        Objective:    Physical Exam Vitals signs and nursing note reviewed.  Constitutional:      General: He is not in acute distress.    Appearance: He is well-developed.  HENT:     Head: Normocephalic and atraumatic.     Nose: Nose normal.  Eyes:     General:        Right eye: No discharge.        Left eye: No discharge.  Neck:     Musculoskeletal: Normal range of motion and neck supple.  Cardiovascular:     Rate and Rhythm: Normal rate and regular rhythm.     Heart sounds: No murmur.    Pulmonary:     Effort: Pulmonary effort is normal.     Breath sounds: Normal breath sounds.  Abdominal:     General: Bowel sounds are normal.     Palpations: Abdomen is soft.     Tenderness: There is no abdominal tenderness.  Skin:    General: Skin is warm and dry.  Neurological:     Mental Status: He is alert and oriented to person, place, and time.     BP 122/82 (BP Location: Left Arm, Patient Position: Sitting, Cuff Size: Normal)   Pulse (!) 52   Temp (!) 97.4 F (36.3 C) (Oral)   Resp 18   Ht 6' (1.829 m)   Wt 260 lb 12.8 oz (118.3 kg)   SpO2 98%   BMI 35.37 kg/m  Wt Readings from Last 3 Encounters:  10/14/18 260 lb 12.8 oz (118.3 kg)  09/19/18 260 lb 0.6 oz (118 kg)  09/10/18 260 lb (117.9 kg)     Lab Results  Component Value Date   WBC 6.4 10/14/2018   HGB 16.2 10/14/2018   HCT 48.8 10/14/2018   PLT 248.0 10/14/2018   GLUCOSE 85 10/14/2018   CHOL 207 (H) 10/14/2018   TRIG 58.0 10/14/2018   HDL 47.20 10/14/2018   LDLDIRECT 162.0 01/10/2018   LDLCALC 148 (H) 10/14/2018   ALT 41 10/14/2018   AST 25 10/14/2018   NA 139 10/14/2018   K 3.8 10/14/2018   CL 102 10/14/2018   CREATININE 0.91 10/14/2018   BUN 15 10/14/2018  CO2 26 10/14/2018   TSH 1.54 10/14/2018   PSA 2.39 09/21/2016   HGBA1C 6.3 10/14/2018    Lab Results  Component Value Date   TSH 1.54 10/14/2018   Lab Results  Component Value Date   WBC 6.4 10/14/2018   HGB 16.2 10/14/2018   HCT 48.8 10/14/2018   MCV 87.0 10/14/2018   PLT 248.0 10/14/2018   Lab Results  Component Value Date   NA 139 10/14/2018   K 3.8 10/14/2018   CO2 26 10/14/2018   GLUCOSE 85 10/14/2018   BUN 15 10/14/2018   CREATININE 0.91 10/14/2018   BILITOT 0.5 10/14/2018   ALKPHOS 73 10/14/2018   AST 25 10/14/2018   ALT 41 10/14/2018   PROT 7.7 10/14/2018   ALBUMIN 4.7 10/14/2018   CALCIUM 9.8 10/14/2018   GFR 106.29 10/14/2018   Lab Results  Component Value Date   CHOL 207 (H) 10/14/2018   Lab Results   Component Value Date   HDL 47.20 10/14/2018   Lab Results  Component Value Date   LDLCALC 148 (H) 10/14/2018   Lab Results  Component Value Date   TRIG 58.0 10/14/2018   Lab Results  Component Value Date   CHOLHDL 4 10/14/2018   Lab Results  Component Value Date   HGBA1C 6.3 10/14/2018       Assessment & Plan:   Problem List Items Addressed This Visit    Hyperlipidemia   Relevant Orders   Lipid Profile (Completed)   Essential hypertension - Primary    Well controlled, no changes to meds. Encouraged heart healthy diet such as the DASH diet and exercise as tolerated.       Relevant Orders   CBC (Completed)   Comp Met (CMET) (Completed)   TSH (Completed)   Esophageal reflux    Struggling with epigastric pain after eating. Partially treated with Omeprazole. H pylori negative. Add Famotidine qhs and if no improvement then will need to return to Gastroenterology for evaluation      Relevant Medications   famotidine (PEPCID) 40 MG tablet   Diabetes mellitus type 2 in obese ([name])    hgba1c acceptable, minimize simple carbs. Increase exercise as tolerated. Continue current meds      Relevant Orders   HgB A1c (Completed)   Insulin, random (Completed)   Back pain    Encouraged moist heat and gentle stretching as tolerated. May try NSAIDs and prescription meds as directed and report if symptoms worsen or seek immediate care. Had a recent ER trip due to pain. Is now doing better with help of PT      Atypical chest pain    Follows with Dr Debara Pickett of cardiology and thus far work up has been negative for cardiac cause.       Vitamin D deficiency    Supplement and monitor      Relevant Orders   Vitamin D (25 hydroxy) (Completed)   Type 2 diabetes mellitus without complication, without long-term current use of insulin ([name])    hgba1c acceptable, minimize simple carbs. Increase exercise as tolerated.        Other Visit Diagnoses    Elevated sed rate       Relevant  Orders   Sedimentation rate (Completed)   Abdominal pain, unspecified abdominal location       Relevant Orders   H. pylori antibody, IgG (Completed)      I am having Chan E. Kenner start on famotidine. I am also having him maintain his  aspirin EC, multivitamin, acetaminophen, CONTOUR NEXT MONITOR, metFORMIN, glucose blood, BAYER MICROLET LANCETS, albuterol, tadalafil, Vitamin D (Ergocalciferol), hyoscyamine, atorvastatin, omeprazole, ibuprofen, fluticasone, amLODipine, diclofenac, triamterene-hydrochlorothiazide, etodolac, and baclofen.  Meds ordered this encounter  Medications  . famotidine (PEPCID) 40 MG tablet    Sig: Take 1 tablet (40 mg total) by mouth daily.    Dispense:  30 tablet    Refill:  4     Penni Homans, MD

## 2018-10-23 ENCOUNTER — Telehealth (INDEPENDENT_AMBULATORY_CARE_PROVIDER_SITE_OTHER): Payer: Self-pay | Admitting: Family Medicine

## 2018-10-23 NOTE — Telephone Encounter (Signed)
Records faxed to [facility] Cristy Friedlander [phone]. Originally processed by [employer] on 10/10/18

## 2018-11-01 DIAGNOSIS — M25672 Stiffness of left ankle, not elsewhere classified: Secondary | ICD-10-CM | POA: Diagnosis not present

## 2018-11-01 DIAGNOSIS — M25571 Pain in right ankle and joints of right foot: Secondary | ICD-10-CM | POA: Diagnosis not present

## 2018-11-01 DIAGNOSIS — M25572 Pain in left ankle and joints of left foot: Secondary | ICD-10-CM | POA: Diagnosis not present

## 2018-11-04 DIAGNOSIS — M25572 Pain in left ankle and joints of left foot: Secondary | ICD-10-CM | POA: Diagnosis not present

## 2018-11-04 DIAGNOSIS — M25571 Pain in right ankle and joints of right foot: Secondary | ICD-10-CM | POA: Diagnosis not present

## 2018-11-04 DIAGNOSIS — M25672 Stiffness of left ankle, not elsewhere classified: Secondary | ICD-10-CM | POA: Diagnosis not present

## 2018-11-06 DIAGNOSIS — M25571 Pain in right ankle and joints of right foot: Secondary | ICD-10-CM | POA: Diagnosis not present

## 2018-11-06 DIAGNOSIS — M25671 Stiffness of right ankle, not elsewhere classified: Secondary | ICD-10-CM | POA: Diagnosis not present

## 2018-11-06 DIAGNOSIS — M25572 Pain in left ankle and joints of left foot: Secondary | ICD-10-CM | POA: Diagnosis not present

## 2018-11-08 ENCOUNTER — Ambulatory Visit (INDEPENDENT_AMBULATORY_CARE_PROVIDER_SITE_OTHER): Payer: Commercial Managed Care - PPO | Admitting: Podiatry

## 2018-11-08 ENCOUNTER — Encounter: Payer: Self-pay | Admitting: Podiatry

## 2018-11-08 DIAGNOSIS — M7661 Achilles tendinitis, right leg: Secondary | ICD-10-CM | POA: Diagnosis not present

## 2018-11-08 DIAGNOSIS — M7662 Achilles tendinitis, left leg: Secondary | ICD-10-CM

## 2018-11-08 MED ORDER — TRIAMCINOLONE ACETONIDE 10 MG/ML IJ SUSP
10.0000 mg | Freq: Once | INTRAMUSCULAR | Status: AC
  Administered 2018-11-08: 10 mg

## 2018-11-09 NOTE — Progress Notes (Signed)
Subjective:   Patient ID: [name] Foster, male   DOB: 53 y.o.   MRN: [id number]   HPI Patient states he still having a lot of pain with his right Achilles and he wants treatment and would like to avoid surgery but no it may be necessary   ROS      Objective:  Physical Exam  Neurovascular status intact with patient's right Achilles still showing pain on the medial side with patient having significant equinus condition also noted     Assessment:  Chronic Achilles tendinitis with large posterior spur formation     Plan:  I discussed this ultimately may require surgery and I discussed the difficulty of surgery recovery and risk and at this point will get a try one more conservative care and did careful medial injection after I first explained chances of rupture and I do want him to wear the boot completely for the next couple weeks and he will continue physical therapy and if symptoms were to persist we can have to consider surgical intervention in this case

## 2018-11-11 DIAGNOSIS — M25572 Pain in left ankle and joints of left foot: Secondary | ICD-10-CM | POA: Diagnosis not present

## 2018-11-11 DIAGNOSIS — M25672 Stiffness of left ankle, not elsewhere classified: Secondary | ICD-10-CM | POA: Diagnosis not present

## 2018-11-11 DIAGNOSIS — M25571 Pain in right ankle and joints of right foot: Secondary | ICD-10-CM | POA: Diagnosis not present

## 2018-11-25 ENCOUNTER — Ambulatory Visit: Payer: Self-pay | Admitting: *Deleted

## 2018-11-25 ENCOUNTER — Other Ambulatory Visit: Payer: Self-pay | Admitting: Family Medicine

## 2018-11-25 DIAGNOSIS — E559 Vitamin D deficiency, unspecified: Secondary | ICD-10-CM

## 2018-11-25 NOTE — Telephone Encounter (Signed)
Pt called with complaints right chest pain for 2 years ago; the pain has moved to his right side since early February 2020; the pt says that it is "not hard to breathe but I can feel it when I breathe"; he also says that in the morning he feels like he has a slight cold ,the pt says that he has been dayquil and tylenol extra strength; the pt says that it feels like it is in his lung; the pt says that the pain is "in his chest underneath his breast muscle"; he rates his pain at 6-8 out of 10; he says that the pain is not relieved; he also complains of itchiness in his throat and chest, and he has a non-productive cough; recommendations made per nurse triage protocol; he normally sees Dr Charlett Blake but this provider has no availability per guidelines; pt offered and accepted appointment with Mackie Pai, [place], 11/26/2018 at 0900; he verbalized understanding; he would also like to talk with Dr Charlett Blake about seeing a pulmonologist; will route to office for notification.   Reason for Disposition . Taking a deep breath makes pain worse  Answer Assessment - Initial Assessment Questions 1. LOCATION: "Where does it hurt?"       Right chest under breast 2. RADIATION: "Does the pain go anywhere else?" (e.g., into neck, jaw, arms, back)     Right side 3. ONSET: "When did the chest pain begin?" (Minutes, hours or days)      10/14/2018 4. PATTERN "Does the pain come and go, or has it been constant since it started?"  "Does it get worse with exertion?"      constant 5. DURATION: "How long does it last" (e.g., seconds, minutes, hours)     days 6. SEVERITY: "How bad is the pain?"  (e.g., Scale 1-10; mild, moderate, or severe)    - MILD (1-3): doesn't interfere with normal activities     - MODERATE (4-7): interferes with normal activities or awakens from sleep    - SEVERE (8-10): excruciating pain, unable to do any normal activities       \6-8 out of 10 7. CARDIAC RISK FACTORS: "Do you have any history of heart  problems or risk factors for heart disease?" (e.g., prior heart attack, angina; high blood pressure, diabetes, being overweight, high cholesterol, smoking, or strong family history of heart disease)     Overweight, diabetic, high cholesterol, high blood 8. PULMONARY RISK FACTORS: "Do you have any history of lung disease?"  (e.g., blood clots in lung, asthma, emphysema, birth control pills)     no 9. CAUSE: "What do you think is causing the chest pain?"     Not sure 10. OTHER SYMPTOMS: "Do you have any other symptoms?" (e.g., dizziness, nausea, vomiting, sweating, fever, difficulty breathing, cough)       Non productive cough, tickling in throat and chest  11. PREGNANCY: "Is there any chance you are pregnant?" "When was your last menstrual period?"       n/a  Protocols used: CHEST PAIN-A-AH

## 2018-11-26 ENCOUNTER — Ambulatory Visit: Payer: Commercial Managed Care - PPO | Admitting: Medical

## 2018-11-26 ENCOUNTER — Ambulatory Visit (HOSPITAL_BASED_OUTPATIENT_CLINIC_OR_DEPARTMENT_OTHER)
Admission: RE | Admit: 2018-11-26 | Discharge: 2018-11-26 | Disposition: A | Discharge status: Home / Self Care | Payer: Commercial Managed Care - PPO | Source: Ambulatory Visit | Attending: Medical | Admitting: Medical

## 2018-11-26 ENCOUNTER — Other Ambulatory Visit: Payer: Self-pay

## 2018-11-26 ENCOUNTER — Encounter: Payer: Self-pay | Admitting: Medical

## 2018-11-26 VITALS — BP 147/82 | HR 98 | Temp 97.9°F | Resp 16 | Ht 72.0 in | Wt 266.8 lb

## 2018-11-26 DIAGNOSIS — R0789 Other chest pain: Secondary | ICD-10-CM | POA: Diagnosis not present

## 2018-11-26 DIAGNOSIS — R0781 Pleurodynia: Secondary | ICD-10-CM

## 2018-11-26 DIAGNOSIS — K219 Gastro-esophageal reflux disease without esophagitis: Secondary | ICD-10-CM | POA: Diagnosis not present

## 2018-11-26 DIAGNOSIS — R079 Chest pain, unspecified: Secondary | ICD-10-CM | POA: Diagnosis not present

## 2018-11-26 DIAGNOSIS — M546 Pain in thoracic spine: Secondary | ICD-10-CM | POA: Insufficient documentation

## 2018-11-26 LAB — TROPONIN I: TNIDX: 0.01 ug/l (ref 0.00–0.06)

## 2018-11-26 MED ORDER — FAMOTIDINE 20 MG PO TABS: 20.0000 mg | ORAL_TABLET | Freq: Every day | ORAL | 3 refills | Status: DC

## 2018-11-26 MED ORDER — LIDOCAINE VISCOUS HCL 2 % MT SOLN
15.0000 mL | Freq: Once | OROMUCOSAL | Status: AC
  Administered 2018-11-26: 15 mL via OROMUCOSAL

## 2018-11-26 MED ORDER — HYOSCYAMINE SULFATE 0.125 MG SL SUBL
0.2500 mg | SUBLINGUAL_TABLET | Freq: Once | SUBLINGUAL | Status: AC
  Administered 2018-11-26: 0.25 mg via SUBLINGUAL

## 2018-11-26 MED ORDER — ALUM & MAG HYDROXIDE-SIMETH 200-200-20 MG/5ML PO SUSP
30.0000 mL | Freq: Once | ORAL | Status: AC
  Administered 2018-11-26: 30 mL via ORAL

## 2018-11-26 NOTE — Progress Notes (Signed)
Subjective:    Patient ID: [name] Foster, male    DOB: 1965-10-17, 53 y.o.   MRN: [id number]  HPI  Pt in for evaluation. She has history of rt side chest pain, mid chest pain, itchy throat and dry cough. When he takes a deep breath it hurts on rt side of chest lower side. Pt states he has center of chest pain for 2 years(he states this pain is typical daily pain with no new features). Pt had ekg done in past and he had work up with cardiologist in the past. EF 55-60%.  2018 he did see cardiologist at [facility]. He had normal ekg. [facility]. Assessment/Plan:   1. Other chest pain EKG - PERFORMED DURING CLINIC VISIT  XR CHEST PA AND LATERAL  CBC and Differential  Comprehensive Metabolic Panel  2. Epigastric abdominal pain CBC and Differential  Comprehensive Metabolic Panel  3. Essential hypertension  4. Gastroesophageal reflux disease without esophagitis   1. Chest x-ray negative. CBC and CMP are normal. Liver enzymes are not elevated today. We reviewed previous workups as well. 2. He will follow-up next week with GI and cardiology as scheduled. He is given a prescription for omeprazole 40 mg daily. He will discontinue NSAIDs completely. Continue efforts with dietary modifications. Return here or go to emergency department if symptoms worsen.   Pt does admit some recent reflux and belching. Pt currently on omeprazole 40 mg a day. In past was on ranitidine until it was taken off the market.   Review of Systems  Constitutional: Negative for chills, diaphoresis, fatigue and fever.  HENT: Negative for congestion, drooling, ear pain and voice change.   Respiratory: Negative for cough, chest tightness, shortness of breath and wheezing.   Cardiovascular: Positive for chest pain. Negative for palpitations.       No pressure/pain in chest when walking. No jaw pain, no arm pain, no nauses, no sweating.  Gastrointestinal: Positive for abdominal pain. Negative for diarrhea, nausea and vomiting.   Musculoskeletal: Positive for back pain. Negative for joint swelling, myalgias and neck pain.       Rt side pectoral area pain. Some pain radiating to mid back tspine area.  No leg pain. And no wheezing.   Skin: Negative for rash.  Neurological: Negative for dizziness, light-headedness and headaches.  Hematological: Negative for adenopathy. Does not bruise/bleed easily.  Psychiatric/Behavioral: Negative for behavioral problems, confusion, dysphoric mood and suicidal ideas. The patient is not nervous/anxious.     Past Medical History:  Diagnosis Date  . Bone spur    left foot  . Chest pain   . Diabetes mellitus type 2 in obese ([place]) 07/13/2013  . Diverticulosis   . Erectile dysfunction 01/15/2013  . Esophageal reflux 01/15/2013  . Fatty liver 07/04/2015  . GERD (gastroesophageal reflux disease)   . Hiatal hernia   . Hiatal hernia with gastroesophageal reflux 03/20/2014  . Hyperglycemia 07/13/2013  . Hyperplastic colon polyp   . Hypertension   . Internal hemorrhoids   . Mixed hyperlipidemia   . Murmur   . OSA (obstructive sleep apnea)    s/p UPPP  . Plantar fasciitis   . Prediabetes   . Reflux esophagitis   . Stomach ulcer    from PCP  . Urinary hesitancy 09/30/2015     Social History   Socioeconomic History  . Marital status: Married    Spouse name: [name]  . Number of children: Not on file  . Years of education: Not on file  .  Highest education level: Not on file  Occupational History  . Occupation: Nature conservation officer: [employer]  . Financial resource strain: Not on file  . Food insecurity:    Worry: Not on file    Inability: Not on file  . Transportation needs:    Medical: Not on file    Non-medical: Not on file  Tobacco Use  . Smoking status: Never Smoker  . Smokeless tobacco: Never Used  Substance and Sexual Activity  . Alcohol use: No  . Drug use: No  . Sexual activity: Yes    Partners: Female  Lifestyle  . Physical  activity:    Days per week: Not on file    Minutes per session: Not on file  . Stress: Not on file  Relationships  . Social connections:    Talks on phone: Not on file    Gets together: Not on file    Attends religious service: Not on file    Active member of club or organization: Not on file    Attends meetings of clubs or organizations: Not on file    Relationship status: Not on file  . Intimate partner violence:    Fear of current or ex partner: Not on file    Emotionally abused: Not on file    Physically abused: Not on file    Forced sexual activity: Not on file  Other Topics Concern  . Not on file  Social History Narrative   Tobacco Use - No.    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 [place] STUDY N/A 02/28/2016   Procedure: [place] STUDY;  Surgeon: Jerene Bears, MD;  Location: [facility];  Service: Gastroenterology;  Laterality: N/A;  . ESOPHAGEAL MANOMETRY N/A 09/01/2013   Procedure: ESOPHAGEAL MANOMETRY (EM);  Surgeon: Sable Feil, MD;  Location: [facility];  Service: Endoscopy;  Laterality: N/A;  . ESOPHAGEAL MANOMETRY N/A 02/28/2016   Procedure: ESOPHAGEAL MANOMETRY (EM);  Surgeon: Jerene Bears, MD;  Location: [facility];  Service: Gastroenterology;  Laterality: N/A;  . TONSILLECTOMY    . UVULECTOMY      Family History  Problem Relation Age of Onset  . Dementia Mother   . Diabetes Mother   . Hypertension Mother   . Heart failure Mother   . Stroke Mother   . Obesity Mother   . Hypertension Other        siblings  . Diabetes Sister   . Cancer Brother        lung cancer smoker  . Heart attack Maternal Grandmother   . Diabetes  Sister   . Pancreatic disease Sister   . Obesity Father   . Colon cancer Neg Hx     Allergies  Allergen Reactions  . Hydrocodone Itching  . Tramadol Itching    Current Outpatient Medications on File Prior to Visit  Medication Sig Dispense Refill  . acetaminophen (TYLENOL) 500 MG tablet Take 1 tablet (500 mg total) by  mouth every 6 (six) hours as needed. 60 tablet 0  . albuterol (PROVENTIL HFA;VENTOLIN HFA) 108 (90 Base) MCG/ACT inhaler Inhale 2 puffs into the lungs every 6 (six) hours as needed for wheezing or shortness of breath. 1 Inhaler 0  . amLODipine (NORVASC) 5 MG tablet Take 1 tablet (5 mg total) by mouth daily. 30 tablet 3  . aspirin EC 81 MG tablet Take 81 mg by mouth daily.    [name] atorvastatin (LIPITOR) 10 MG tablet Take 1 tablet (10 mg total) by mouth at bedtime. 30 tablet 3  . baclofen (LIORESAL) 10 MG tablet Take 0.5-1 tablets (5-10 mg total) by mouth at bedtime as needed for muscle spasms. 30 each 3  . BAYER MICROLET LANCETS lancets Test blood sugars twice daily 100 each 0  . Blood Glucose Monitoring Suppl (CONTOUR NEXT MONITOR) w/Device KIT 1 kit by Does not apply route 2 (two) times daily. 1 kit 0  . diclofenac (VOLTAREN) 75 MG EC tablet Take 1 tablet (75 mg total) by mouth 2 (two) times daily. 50 tablet 2  . etodolac (LODINE) 400 MG tablet Take 1 tablet (400 mg total) by mouth 2 (two) times daily as needed. 60 tablet 3  . famotidine (PEPCID) 40 MG tablet Take 1 tablet (40 mg total) by mouth daily. 30 tablet 4  . fluticasone (FLONASE) 50 MCG/ACT nasal spray Place 2 sprays into both nostrils daily. 16 g 6  . glucose blood (CONTOUR NEXT TEST) test strip Test blood sugars twice daily 100 each 0  . hyoscyamine (LEVSIN SL) 0.125 MG SL tablet Place 1 tablet (0.125 mg total) under the tongue every 4 (four) hours as needed. 30 tablet 5  . ibuprofen (ADVIL,MOTRIN) 600 MG tablet Take 1 tablet (600 mg total) by mouth 3 (three) times daily as needed. 90 tablet 1  . metFORMIN  (GLUCOPHAGE) 500 MG tablet Take 1 tablet (500 mg total) by mouth 2 (two) times daily with a meal. 60 tablet 0  . Multiple Vitamin (MULTIVITAMIN) tablet Take 1 tablet by mouth daily.    [name] omeprazole (PRILOSEC) 40 MG capsule Take 1 capsule (40 mg total) by mouth 2 (two) times daily. 180 capsule 1  . tadalafil (CIALIS) 5 MG tablet Take 1 tablet (5 mg total) by mouth daily as needed for erectile dysfunction. 30 tablet 5  . triamterene-hydrochlorothiazide (MAXZIDE-25) 37.5-25 MG tablet TAKE 1 TABLET BY MOUTH ONCE DAILY 90 tablet 1  . Vitamin D, Ergocalciferol, (DRISDOL) 1.25 MG (50000 UT) CAPS capsule TAKE 1 CAPSULE BY MOUTH ONCE A WEEK FOR 12 WEEKS 4 capsule 0   No current facility-administered medications on file prior to visit.     BP (!) 147/82   Pulse 98   Temp 97.9 F (36.6 C) (Oral)   Resp 16   Ht 6' (1.829 m)   Wt 266 lb 12.8 oz (121 kg)   SpO2 98%   BMI 36.18 kg/m       Objective:   Physical Exam  General Mental Status- Alert. General Appearance- Not in acute distress.   Skin General: Color- Normal Color. Moisture- Normal Moisture. No rash on inspection of anterior and posterior thorax.  Neck Carotid Arteries- Normal color. Moisture- Normal Moisture. No carotid bruits. No JVD.  Chest and Lung Exam Auscultation: Breath Sounds:-Normal.  Cardiovascular Auscultation:Rythm- Regular. Murmurs & Other Heart Sounds:Auscultation of the heart reveals- No Murmurs.  Abdomen Inspection:-Inspeection Normal. Palpation/Percussion:Note:No mass. Palpation and Percussion of the abdomen reveal- Non Tender, Non Distended + BS, no rebound or guarding.  Neurologic Cranial Nerve exam:- CN III-XII intact(No nystagmus), symmetric smile. Strength:- 5/5 equal and symmetric strength both upper and lower extremities.  Back- mild mid tspine tenderness to palpation.  Lower ext- calfs symmetric bilaterally. Negative homans. No pedal edema.  Anterior thorax- no pain on palpation anterior  thorax.     Assessment & Plan:  For your atypical chest pain for 2 years I do want to go ahead and refer you to cardiologist for stress testing. Appears that has not been done in the past. Your ekg today appears normal sinus rhythm with no ischemic changes. Last ekg also normal. For caution sake getting one set of troponin as your atypical chest pain is almost daily for 2 years. If your symptoms change then recommend ED evaluation.  For pleuritic pain will get chest xray.(can use combo of tylenol and low dose ibuprofen 200 mg) Though no high dose nsaid as not to worsen reflux)  For history of back pain in past will get xray of t spine to see if you may have spine issue which may contribute to mild radiating pain.  Watch skin for any shingles type eruption. If this occurs let us know.  For history of gerd, continue omeprazole. Add famotidine. We gave gi cocktail today.   Follow up in 7-10 days or as needed   40 minutes spent with patient today.  50% of time spent counseling patient on her various symptoms including atypical chest pain, history of chronic chest pressure, right-sided pleuritic pain, thoracic spine pain history of reflux and current radicular pain.  Extra time spent in particular in light of the history of 2 years of atypical chest pressure in context of history of uncontrolled reflux.  Mackie Pai, PA-C

## 2018-11-26 NOTE — Patient Instructions (Addendum)
For your atypical chest pain for 2 years I do want to go ahead and refer you to cardiologist for stress testing. Appears that has not been done in the past. Your ekg today appears normal sinus rhythm with no ischemic changes. Last ekg also normal. For caution sake getting one set of troponin as your atypical chest pain is almost daily for 2 years. If your symptoms change then recommend ED evaluation.  For pleuritic pain will get chest xray.(can use combo of tylenol and low dose ibuprofen 200 mg) Though no high dose nsaid as not to worsen reflux)  For history of back pain in past will get xray of t spine to see if you may have spine issue which may contribute to mild radiating pain.  Watch skin for any shingles type eruption. If this occurs let us know.  For history of gerd, continue omeprazole. Add famotidine. We gave gi cocktail today.   Follow up in 7-10 days or as needed

## 2018-11-27 ENCOUNTER — Ambulatory Visit (INDEPENDENT_AMBULATORY_CARE_PROVIDER_SITE_OTHER): Payer: Commercial Managed Care - PPO | Admitting: Cardiology

## 2018-11-27 ENCOUNTER — Encounter: Payer: Self-pay | Admitting: *Deleted

## 2018-11-27 ENCOUNTER — Encounter: Payer: Self-pay | Admitting: Cardiology

## 2018-11-27 VITALS — HR 85 | Ht 72.0 in | Wt 262.1 lb

## 2018-11-27 DIAGNOSIS — R079 Chest pain, unspecified: Secondary | ICD-10-CM

## 2018-11-27 DIAGNOSIS — E1169 Type 2 diabetes mellitus with other specified complication: Secondary | ICD-10-CM

## 2018-11-27 DIAGNOSIS — I10 Essential (primary) hypertension: Secondary | ICD-10-CM

## 2018-11-27 DIAGNOSIS — E669 Obesity, unspecified: Secondary | ICD-10-CM

## 2018-11-27 DIAGNOSIS — E785 Hyperlipidemia, unspecified: Secondary | ICD-10-CM | POA: Diagnosis not present

## 2018-11-27 MED ORDER — CELECOXIB 100 MG PO CAPS: 100.0000 mg | ORAL_CAPSULE | Freq: Two times a day (BID) | ORAL | 0 refills | Status: DC

## 2018-11-27 MED ORDER — ROSUVASTATIN CALCIUM 10 MG PO TABS: 10.0000 mg | ORAL_TABLET | Freq: Every day | ORAL | 1 refills | Status: DC

## 2018-11-27 NOTE — Progress Notes (Addendum)
Cardiology Office Note:    Date:  11/27/2018   ID:  [name] Foster, DOB 1966/01/27, MRN [id number]  PCP:  [name], MD  Cardiologist:  Shirlee More, MD   Referring MD: Mackie Pai, PA-C  ASSESSMENT:    1. Chest pain in adult   2. Essential hypertension   3. Hyperlipidemia, unspecified hyperlipidemia type   4. Diabetes mellitus type 2 in obese [facility])    PLAN:    In order of problems listed above:  I phoned the patient reviewed the concerns about exposure to coronavirus we made a shared decision that we would defer the test for 6 to 8 weeks and I will asked my staff to put him in recall at that time if symptoms worsen he will contact us  1. Chest pain is quite atypical historically sounds biliary but patient is high risk and he will undergo a stress Myoview study likely to have to be done pharmacologically because of his heel pain and unless he has high risk markers I would not perform further ischemic evaluation.  Even in the current environment with coronavirus I think it is important to be done we will try to schedule in the next 1 to 2 days before we have a high frequency in her community 2. Continue current treatment may require additional therapy 3. Switch to a better tolerated high intensity statin if LDL remains greater than 102nd drug Zetia 4. Well controlled managed by his PCP  Next appointment as needed unless he has an abnormal perfusion study with high risk markers   Medication Adjustments/Labs and Tests Ordered: Current medicines are reviewed at length with the patient today.  Concerns regarding medicines are outlined above.  No orders of the defined types were placed in this encounter.  No orders of the defined types were placed in this encounter.    Chief Complaint  Patient presents with  . Chest Pain    History of Present Illness:    [name] Foster is a 53 y.o. male with T2 DM hypertension and hyperlipidemi who is being seen today for the evaluation  of chest pain at the request of S[place], Percell Miller, [place].  He has a long history of gastroesophageal reflux disease he has had multiple endoscopies and takes a PPI.  Recently has been out of work because of having foot pain and may require surgery.  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  It is not exertional in nature is not relieved with rest and a persist while he is driving the bus.  He does not have exercise intolerance exertional chest pain edema shortness of breath palpitation or syncope he has no history of congenital rheumatic heart disease and has not had fever or chills.  Although symptoms are quite atypical certainly suggestive of biliary tract disease he is a high risk individual with type 2 diabetes hypertension hyperlipidemia and despite a normal EKG I advised him to undergo a myocardial perfusion study.  If normal I will see back in the office as needed and certainly will need to have an evaluation for gallbladder disease.  His blood pressure is mildly elevated I repeated him 156/90 my office  he tells me he follows his home blood pressure that generally is in range and I made no changes medical therapy for hypertension he is poorly tolerant of atorvastatin his LDL is markedly elevated I switch him to rosuvastatin if he does not achieve target combined therapy with said he would be appropriate Past Medical History:  Diagnosis Date  . Bone spur    left foot  . Chest pain   . Diabetes mellitus type 2 in obese ([place]) 07/13/2013  . Diverticulosis   . Erectile dysfunction 01/15/2013  . Esophageal reflux 01/15/2013  . Fatty liver 07/04/2015  . GERD  (gastroesophageal reflux disease)   . Hiatal hernia   . Hiatal hernia with gastroesophageal reflux 03/20/2014  . Hyperglycemia 07/13/2013  . Hyperplastic colon polyp   . Hypertension   . Internal hemorrhoids   . Mixed hyperlipidemia   . Murmur   . OSA (obstructive sleep apnea)    s/p UPPP  . Plantar fasciitis   . Prediabetes   . Reflux esophagitis   . Stomach ulcer    from PCP  . Urinary hesitancy 09/30/2015    Past Surgical History:  Procedure Laterality Date  . [place] STUDY N/A 02/28/2016   Procedure: [place] STUDY;  Surgeon: Jerene Bears, MD;  [facility];  Service: Gastroenterology;  Laterality: N/A;  . ESOPHAGEAL MANOMETRY N/A 09/01/2013   Procedure: ESOPHAGEAL MANOMETRY (EM);  Surgeon: Sable Feil, MD;  Location: [facility];  Service: Endoscopy;  Laterality: N/A;  . ESOPHAGEAL MANOMETRY N/A 02/28/2016   Procedure: ESOPHAGEAL MANOMETRY (EM);  Surgeon: Jerene Bears, MD;  Location: [facility];  Service: Gastroenterology;  Laterality: N/A;  . INGUINAL HERNIA REPAIR    . TONSILLECTOMY    . UMBILICAL HERNIA REPAIR    . UVULECTOMY      Current Medications: Current Meds  Medication Sig  . acetaminophen (TYLENOL) 500 MG tablet Take 1 tablet (500 mg total) by mouth every 6 (six) hours as needed.  [name] albuterol (PROVENTIL HFA;VENTOLIN HFA) 108 (90 Base) MCG/ACT inhaler Inhale 2 puffs into the lungs every 6 (six) hours as needed for wheezing or shortness of breath.  [name] amLODipine (NORVASC) 5 MG tablet Take 1 tablet (5 mg total) by mouth daily.  [name] aspirin EC 81 MG tablet Take 81 mg by mouth daily.  [name] atorvastatin (LIPITOR) 10 MG tablet Take 1 tablet (10 mg total) by mouth at bedtime.  . baclofen (LIORESAL) 10 MG tablet Take 0.5-1 tablets (5-10 mg total) by mouth at bedtime as needed for muscle spasms.  [name] BAYER MICROLET LANCETS lancets Test blood sugars twice daily  . Blood Glucose Monitoring Suppl (CONTOUR NEXT MONITOR) w/Device KIT 1 kit by Does not apply  route 2 (two) times daily.  [name] etodolac (LODINE) 400 MG tablet Take 1 tablet (400 mg total) by mouth 2 (two) times daily as needed.  . famotidine (PEPCID) 20 MG tablet Take 1 tablet (20 mg total) by mouth daily.  . famotidine (PEPCID) 40 MG tablet Take 1 tablet (40 mg total) by mouth daily.  . fluticasone (FLONASE) 50 MCG/ACT nasal spray Place 2 sprays into both nostrils daily.  [name] glucose blood (CONTOUR NEXT TEST) test strip Test blood sugars twice daily  . hyoscyamine (LEVSIN SL) 0.125 MG SL tablet Place 1 tablet (0.125 mg total) under the tongue every 4 (four) hours as needed.  . metFORMIN (GLUCOPHAGE) 500 MG tablet Take 1 tablet (500 mg total) by mouth 2 (two) times daily with a  meal.  . Multiple Vitamin (MULTIVITAMIN) tablet Take 1 tablet by mouth daily.  [name] omeprazole (PRILOSEC) 40 MG capsule Take 1 capsule (40 mg total) by mouth 2 (two) times daily.  . tadalafil (CIALIS) 5 MG tablet Take 1 tablet (5 mg total) by mouth daily as needed for erectile dysfunction.  . triamterene-hydrochlorothiazide (MAXZIDE-25) 37.5-25 MG tablet TAKE 1 TABLET BY MOUTH ONCE DAILY  . Vitamin D, Ergocalciferol, (DRISDOL) 1.25 MG (50000 UT) CAPS capsule TAKE 1 CAPSULE BY MOUTH ONCE A WEEK FOR 12 WEEKS     Allergies:   Hydrocodone and Tramadol   Social History   Socioeconomic History  . Marital status: Married    Spouse name: [name]  . Number of children: Not on file  . Years of education: Not on file  . Highest education level: Not on file  Occupational History  . Occupation: Nature conservation officer: [employer]  . Financial resource strain: Not on file  . Food insecurity:    Worry: Not on file    Inability: Not on file  . Transportation needs:    Medical: Not on file    Non-medical: Not on file  Tobacco Use  . Smoking status: Never Smoker  . Smokeless tobacco: Never Used  Substance and Sexual Activity  . Alcohol use: No  . Drug use: No  . Sexual activity: Yes     Partners: Female  Lifestyle  . Physical activity:    Days per week: Not on file    Minutes per session: Not on file  . Stress: Not on file  Relationships  . Social connections:    Talks on phone: Not on file    Gets together: Not on file    Attends religious service: Not on file    Active member of club or organization: Not on file    Attends meetings of clubs or organizations: Not on file    Relationship status: Not on file  Other Topics Concern  . Not on file  Social History Narrative   Tobacco Use - No.    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[employer])   grew up in [place] area   Married - 13 years   Alcohol Use - no   Regular Exercise - yes   Drug Use - no   3 girls    3 boys   Smoking Status:      Packs/Day:     Caffeine use/day:  1 cup coffee every other day   Does Patient Exercise:  no     Family History: The patient's family history includes Cancer in his brother; Dementia in his mother; Diabetes in his mother, sister, and sister; Heart attack in his maternal grandmother; Heart failure in his mother; Hypertension in his mother and another family member; Obesity in his father and mother; Pancreatic disease in his sister; Stroke in his mother. There is no history of Colon cancer.  ROS:   Review of Systems  Constitution: Negative.  HENT: Negative.   Eyes: Negative.   Cardiovascular: Positive for chest pain.  Respiratory: Negative.   Endocrine: Negative.   Hematologic/Lymphatic: Negative.   Skin: Negative.   Musculoskeletal: Positive for joint pain (foot).  Gastrointestinal: Negative.   Genitourinary: Negative.   Neurological: Negative.   Psychiatric/Behavioral: Negative.   Allergic/Immunologic: Negative.    Please see the history of present illness.     All other systems reviewed and are negative.  EKGs/Labs/Other Studies Reviewed:  The following studies were reviewed today:   EKG:  EKG yesterday personally reviewed and demonstrates normal pattern   EKG 11/26/18 peesonally reviewd sinus bradycardia 55 BPM normal EKG   Recent Labs: 10/14/2018: ALT 41; BUN 15; Creatinine, Ser 0.91; Hemoglobin 16.2; Platelets 248.0; Potassium 3.8; Sodium 139; TSH 1.54  Recent Lipid Panel    Component Value Date/Time   CHOL 207 (H) 10/14/2018 1114   CHOL 168 11/27/2016 1158   TRIG 58.0 10/14/2018 1114   HDL 47.20 10/14/2018 1114   HDL 38 (L) 11/27/2016 1158   CHOLHDL 4 10/14/2018 1114   VLDL 11.6 10/14/2018 1114   LDLCALC 148 (H) 10/14/2018 1114   LDLCALC 112 (H) 11/27/2016 1158   LDLDIRECT 162.0 01/10/2018 1738   A1 c 6.3% Physical Exam:    VS:  Pulse 85   Ht 6' (1.829 m)   Wt 262 lb 1.9 oz (118.9 kg)   SpO2 98%   BMI 35.55 kg/m     Wt Readings from Last 3 Encounters:  11/27/18 262 lb 1.9 oz (118.9 kg)  11/26/18 266 lb 12.8 oz (121 kg)  10/14/18 260 lb 12.8 oz (118.3 kg)     GEN:  Well nourished, well developed in no acute distress HEENT: Normal NECK: No JVD; No carotid bruits LYMPHATICS: No lymphadenopathy CARDIAC: RRR, no murmurs, rubs, gallops RESPIRATORY:  Clear to auscultation without rales, wheezing or rhonchi  ABDOMEN: Soft, non-tender, non-distended MUSCULOSKELETAL:  No edema; No deformity  SKIN: Warm and dry NEUROLOGIC:  Alert and oriented x 3 PSYCHIATRIC:  Normal affect     Signed, Shirlee More, MD  11/27/2018 4:12 PM    [facility]

## 2018-11-27 NOTE — Patient Instructions (Addendum)
Medication Instructions:   Your physician has recommended you make the following change in your medication:  Stop taking Atorvastatin  Start taking Rosuvastatin 10 mg. Take 1 tab daily.  Start taking Celebrex 100 mg. Take 1 tablet twice a day for 2 weeks.  If you need a refill on your cardiac medications before your next appointment, please call your pharmacy.   Lab work:  None  If you have labs (blood work) drawn today and your tests are completely normal, you will receive your results only by: [name] MyChart Message (if you have MyChart) OR . A paper copy in the mail If you have any lab test that is abnormal or we need to change your treatment, we will call you to review the results.  Testing/Procedures:  Your physician has requested that you have en exercise stress myoview. For further information please visit [email or web address]. Please follow instruction sheet, as given.    Follow-Up:  With Dr. Bettina Gavia as needed.   At [facility], you and your health needs are our priority.  As part of our continuing mission to provide you with exceptional heart care, we have created designated Provider Care Teams.  These Care Teams include your primary Cardiologist (physician) and Advanced Practice Providers (APPs -  Physician Assistants and Nurse Practitioners) who all work together to provide you with the care you need, when you need it.         Costochondritis Costochondritis is swelling and irritation (inflammation) of the tissue (cartilage) that connects your ribs to your breastbone (sternum). This causes pain in the front of your chest. Usually, the pain:  Starts gradually.  Is in more than one rib. This condition usually goes away on its own over time. Follow these instructions at home:  Do not do anything that makes your pain worse.  If directed, put ice on the painful area: ? Put ice in a plastic bag. ? Place a towel between your skin and the bag. ? Leave the ice on  for 20 minutes, 2-3 times a day.  If directed, put heat on the affected area as often as told by your doctor. Use the heat source that your doctor tells you to use, such as a moist heat pack or a heating pad. ? Place a towel between your skin and the heat source. ? Leave the heat on for 20-30 minutes. ? Take off the heat if your skin turns bright red. This is very important if you cannot feel pain, heat, or cold. You may have a greater risk of getting burned.  Take over-the-counter and prescription medicines only as told by your doctor.  Return to your normal activities as told by your doctor. Ask your doctor what activities are safe for you.  Keep all follow-up visits as told by your doctor. This is important. Contact a doctor if:  You have chills or a fever.  Your pain does not go away or it gets worse.  You have a cough that does not go away. Get help right away if:  You are short of breath. This information is not intended to replace advice given to you by your health care provider. Make sure you discuss any questions you have with your health care provider. Document Released: 02/14/2008 Document Revised: 03/17/2016 Document Reviewed: 12/22/2015 Elsevier Interactive Patient Education  2019 [employer].

## 2018-11-29 ENCOUNTER — Telehealth (HOSPITAL_COMMUNITY): Payer: Self-pay | Admitting: *Deleted

## 2018-11-29 NOTE — Telephone Encounter (Signed)
Close encounter 

## 2018-12-02 ENCOUNTER — Telehealth: Payer: Self-pay | Admitting: Cardiology

## 2018-12-02 ENCOUNTER — Telehealth: Payer: Self-pay

## 2018-12-02 ENCOUNTER — Telehealth: Payer: Self-pay | Admitting: *Deleted

## 2018-12-02 NOTE — Telephone Encounter (Signed)
New message   Left a message on home voice mail to call back to reschedule an appointment due to mandatory government regulations regarding COVID-19, Your physician has reviewed and has agreed to have your test postponed and rescheduled for a later date.

## 2018-12-02 NOTE — Telephone Encounter (Signed)
° °  Primary Cardiologist:  [facility]  Patient contacted.  History reviewed.  No symptoms to suggest any unstable cardiac conditions.  Based on discussion, with current pandemic situation, we will be postponing this appointment for [facility].  If symptoms change, he has been instructed to contact our office.     Calla Kicks  12/02/2018 10:26 AM         .

## 2018-12-02 NOTE — Telephone Encounter (Signed)
   Primary Cardiologist:  [facility]  Patient contacted.  History reviewed.  No symptoms to suggest any unstable cardiac conditions.  Based on discussion, with current pandemic situation, we will be postponing this exercise myoview appointment for [facility].  If symptoms change, he has been instructed to contact our office.   Austin Miles, RN  12/02/2018 9:42 AM         .

## 2018-12-03 NOTE — Telephone Encounter (Signed)
New message  Left a message on cell phone voice mail to call back to reschedule an appointment due to mandatory government regulations regarding COVID-19, Your physician has reviewed and has agreed to have your test postponed and rescheduled for a later date.

## 2018-12-04 ENCOUNTER — Inpatient Hospital Stay (HOSPITAL_COMMUNITY): Admission: RE | Admit: 2018-12-04 | Payer: Commercial Managed Care - PPO | Source: Ambulatory Visit

## 2018-12-06 ENCOUNTER — Ambulatory Visit: Payer: Commercial Managed Care - PPO | Admitting: Podiatry

## 2018-12-11 ENCOUNTER — Ambulatory Visit (INDEPENDENT_AMBULATORY_CARE_PROVIDER_SITE_OTHER): Payer: Commercial Managed Care - PPO | Admitting: Podiatry

## 2018-12-11 ENCOUNTER — Ambulatory Visit (INDEPENDENT_AMBULATORY_CARE_PROVIDER_SITE_OTHER): Payer: Commercial Managed Care - PPO

## 2018-12-11 ENCOUNTER — Encounter: Payer: Self-pay | Admitting: Podiatry

## 2018-12-11 ENCOUNTER — Other Ambulatory Visit: Payer: Self-pay

## 2018-12-11 VITALS — Temp 97.9°F

## 2018-12-11 DIAGNOSIS — M7661 Achilles tendinitis, right leg: Secondary | ICD-10-CM | POA: Diagnosis not present

## 2018-12-11 DIAGNOSIS — M722 Plantar fascial fibromatosis: Secondary | ICD-10-CM

## 2018-12-11 DIAGNOSIS — M7662 Achilles tendinitis, left leg: Secondary | ICD-10-CM | POA: Diagnosis not present

## 2018-12-11 NOTE — Progress Notes (Signed)
Subjective:   Patient ID: [name] Foster, male   DOB: 53 y.o.   MRN: [id number]   HPI Patient presents stating still having a lot of pain in the right posterior heel both medial and lateral side despite all the treatments we have done including physical therapy immobilization injections oral anti-inflammatories and shoe gear modifications   ROS      Objective:  Physical Exam  Acute plantar fasciitis when I pressed into the back of the right heel with discomfort noted at the insertion calcaneus medial central lateral side     Assessment:  Continued chronic Achilles tendinitis right that so far has failed to respond to treatment     Plan:  Reviewed condition at great length and review x-rays discuss a increase of her size.  I do think that the only chance we have in the nonsurgical environment is shockwave and I reviewed shockwave versus going ahead and open cutting surgery and patient is opted for shockwave and is scheduled to have this done in the next week.  I do think of both the medial lateral and central portion of the heel should be worked on along with the calf muscle itself.  All questions answered today  X-rays indicate increase in the spur size right with increased density noted

## 2018-12-13 ENCOUNTER — Encounter

## 2018-12-16 ENCOUNTER — Ambulatory Visit: Payer: Commercial Managed Care - PPO

## 2018-12-16 ENCOUNTER — Other Ambulatory Visit: Payer: Self-pay

## 2018-12-16 DIAGNOSIS — M79676 Pain in unspecified toe(s): Secondary | ICD-10-CM

## 2018-12-16 DIAGNOSIS — M7662 Achilles tendinitis, left leg: Secondary | ICD-10-CM

## 2018-12-16 DIAGNOSIS — M7661 Achilles tendinitis, right leg: Secondary | ICD-10-CM

## 2018-12-17 ENCOUNTER — Other Ambulatory Visit: Payer: Commercial Managed Care - PPO

## 2018-12-17 NOTE — Progress Notes (Signed)
Patient is here today with complaint of chronic right posterior heel pain.  This is been ongoing for several months, he has tried physical therapy, immobilization, injections, oral anti-inflammatories, and shoe gear modification.  He states he is not had relief from any of these therapies so far.  Pain on palpation to right posterior heel, with noted swelling.  ESWT administered for 5 J and tolerated well.  Calf was also treated with a epat.  He is to follow-up next week for second treatment.  He is to avoid NSAIDs and ice, and utilize his boot.

## 2018-12-24 ENCOUNTER — Other Ambulatory Visit: Payer: Commercial Managed Care - PPO

## 2018-12-31 ENCOUNTER — Other Ambulatory Visit: Payer: Commercial Managed Care - PPO

## 2019-01-06 ENCOUNTER — Telehealth: Payer: Self-pay | Admitting: *Deleted

## 2019-01-06 NOTE — Telephone Encounter (Signed)
Attorney - Perrin Smack states they are waiting clinicals for court ordered mediation tomorrow, please call.

## 2019-01-15 ENCOUNTER — Other Ambulatory Visit: Payer: Self-pay | Admitting: Family Medicine

## 2019-01-15 ENCOUNTER — Telehealth: Payer: Self-pay | Admitting: Family Medicine

## 2019-01-15 DIAGNOSIS — I10 Essential (primary) hypertension: Secondary | ICD-10-CM

## 2019-01-15 DIAGNOSIS — E559 Vitamin D deficiency, unspecified: Secondary | ICD-10-CM

## 2019-01-15 NOTE — Telephone Encounter (Signed)
Pt overdue for follow-up visit.

## 2019-01-15 NOTE — Telephone Encounter (Signed)
Copied from [place] [id number]. Topic: Quick Communication - Rx Refill/Question >> Jan 15, 2019 11:01 AM Nils Flack wrote: Medication:  famotidine (PEPCID) 20 MG tablet,  Has the patient contacted their pharmacy? Yes.   (Agent: If no, request that the patient contact the pharmacy for the refill.) (Agent: If yes, when and what did the pharmacy advise?) told to call office   Preferred Pharmacy (with phone number or street name): [facility] [address] - would like 90 day supply   Agent: Please be advised that RX refills may take up to 3 business days. We ask that you follow-up with your pharmacy.

## 2019-01-20 ENCOUNTER — Telehealth (HOSPITAL_COMMUNITY): Payer: Self-pay | Admitting: Radiology

## 2019-01-20 NOTE — Telephone Encounter (Signed)
Patient given detailed instructions per Myocardial Perfusion Study Information Sheet for the test on 01/22/2019 at 7:30. Patient notified to arrive 15 minutes early and that it is imperative to arrive on time for appointment to keep from having the test rescheduled.  If you need to cancel or reschedule your appointment, please call the office within 24 hours of your appointment. . Patient verbalized understanding.EHK

## 2019-01-22 ENCOUNTER — Other Ambulatory Visit: Payer: Self-pay

## 2019-01-22 ENCOUNTER — Ambulatory Visit (HOSPITAL_COMMUNITY): Payer: Commercial Managed Care - PPO | Attending: Cardiovascular Disease

## 2019-01-22 VITALS — Ht 72.0 in | Wt 262.0 lb

## 2019-01-22 DIAGNOSIS — R0602 Shortness of breath: Secondary | ICD-10-CM | POA: Insufficient documentation

## 2019-01-22 DIAGNOSIS — R079 Chest pain, unspecified: Secondary | ICD-10-CM | POA: Insufficient documentation

## 2019-01-22 DIAGNOSIS — I10 Essential (primary) hypertension: Secondary | ICD-10-CM | POA: Insufficient documentation

## 2019-01-22 LAB — MYOCARDIAL PERFUSION IMAGING
LV dias vol: 108 mL (ref 62–150)
LV sys vol: 39 mL
Peak HR: 106 {beats}/min
Rest HR: 65 {beats}/min
SDS: 0
SRS: 0
SSS: 0
TID: 0.97

## 2019-01-22 MED ORDER — TECHNETIUM TC 99M TETROFOSMIN IV KIT
26.6000 | PACK | Freq: Once | INTRAVENOUS | Status: AC | PRN
  Administered 2019-01-22: 26.6 via INTRAVENOUS
  Filled 2019-01-22: qty 27

## 2019-01-22 MED ORDER — TECHNETIUM TC 99M TETROFOSMIN IV KIT
8.5000 | PACK | Freq: Once | INTRAVENOUS | Status: AC | PRN
  Administered 2019-01-22: 8.5 via INTRAVENOUS
  Filled 2019-01-22: qty 9

## 2019-01-22 MED ORDER — REGADENOSON 0.4 MG/5ML IV SOLN
0.4000 mg | Freq: Once | INTRAVENOUS | Status: AC
  Administered 2019-01-22: 0.4 mg via INTRAVENOUS

## 2019-01-24 ENCOUNTER — Other Ambulatory Visit: Payer: Self-pay

## 2019-01-24 ENCOUNTER — Ambulatory Visit: Payer: Self-pay

## 2019-01-24 DIAGNOSIS — M7661 Achilles tendinitis, right leg: Secondary | ICD-10-CM

## 2019-01-24 DIAGNOSIS — M79676 Pain in unspecified toe(s): Secondary | ICD-10-CM

## 2019-01-24 DIAGNOSIS — M7662 Achilles tendinitis, left leg: Secondary | ICD-10-CM

## 2019-01-24 NOTE — Progress Notes (Signed)
Patient is here today with complaint of chronic right posterior heel pain.  This is been ongoing for several months, he has tried physical therapy, immobilization, injections, oral anti-inflammatories, and shoe gear modification.  He states he is not had relief from any of these therapies so far. He states also that he had extreme flare up pain for 2 days post treatment.   Pain on palpation to right posterior heel, with noted swelling.  ESWT administered for 2.5 J and tolerated well.  Calf was also treated with a epat.  He is to follow-up next week for 3rd treatment.  He is to avoid NSAIDs and ice, and utilize his boot.

## 2019-01-27 ENCOUNTER — Telehealth: Payer: Self-pay

## 2019-01-27 NOTE — Telephone Encounter (Signed)
-----   Message from Richardo Priest, MD sent at 01/23/2019  7:38 AM EDT ----- Regarding: FW: Normal test ----- Message ----- From: Thayer Headings, MD Sent: 01/22/2019   4:08 PM EDT To: Richardo Priest, MD

## 2019-01-27 NOTE — Telephone Encounter (Signed)
Results relayed to patient with no further questions. Patient states he will call back to schedule f/u if needed. Patient also requested a copy or results be sent to him and Dr.[name] office.

## 2019-01-29 NOTE — Telephone Encounter (Signed)
Entered in error

## 2019-01-30 ENCOUNTER — Telehealth: Payer: Self-pay | Admitting: Podiatry

## 2019-01-30 ENCOUNTER — Telehealth: Payer: Self-pay | Admitting: Internal Medicine

## 2019-01-30 NOTE — Telephone Encounter (Signed)
Pt's wife, [name] called and I informed her of Dr. Mellody Drown 01/29/01/2020 11:18am statement. [name] states she is just concerned because pt is in so much pain and doesn't want to do activities, even driving he ask her to drive and he can't stand how she drives. I told [name] I would inform Dr. Paulla Dolly.

## 2019-01-30 NOTE — Telephone Encounter (Signed)
The pt has worse GERD symptoms. Is taking prilosec daily and pepsid at night.  Scheduled for telehealth visit ono 6/8

## 2019-01-30 NOTE — Telephone Encounter (Signed)
Pt stated that he has been having severe GERD discomfort and chest pt;  he requested to be evaluated in-office.  Please advise scheduling.

## 2019-01-30 NOTE — Telephone Encounter (Signed)
I want to complete the 3rd treatment and then give it a couple of weeks to settle down. If not improved in the next 4 weeks then they can come together to discuss surgery. The shockwave takes 6-8 weeks to know if it will improve the condition

## 2019-01-30 NOTE — Telephone Encounter (Signed)
Pt is scheduled for EPAT#3 tomorrow and his wife is stating that he also needs an appt with Dr.Regal to discuss surgery due to his continued foot pain. Pts wife stated she wanted to be present at the appt because she does not believe the patient is telling the doctor everything he needs to know. Due to the COVID-19 restrictions we are only allowing the patient in the office at this time so the pts wife is requesting that we allow her into the appt even with the restrictions or do a virtual visit. Please discuss with doctor.

## 2019-01-30 NOTE — Telephone Encounter (Addendum)
Left message requesting pt's wife, [name] call for Dr. Mellody Drown recommendations.

## 2019-01-30 NOTE — Telephone Encounter (Signed)
We can try medication to reduce pain. Either narcotic or tramadolv

## 2019-01-31 ENCOUNTER — Telehealth: Payer: Self-pay | Admitting: Internal Medicine

## 2019-01-31 ENCOUNTER — Other Ambulatory Visit: Payer: Self-pay

## 2019-01-31 ENCOUNTER — Ambulatory Visit: Payer: Commercial Managed Care - PPO

## 2019-01-31 DIAGNOSIS — M79676 Pain in unspecified toe(s): Secondary | ICD-10-CM

## 2019-01-31 DIAGNOSIS — M7661 Achilles tendinitis, right leg: Secondary | ICD-10-CM

## 2019-01-31 DIAGNOSIS — M7662 Achilles tendinitis, left leg: Secondary | ICD-10-CM

## 2019-01-31 MED ORDER — ACETAMINOPHEN 500 MG PO TABS: 500.0000 mg | ORAL_TABLET | Freq: Four times a day (QID) | ORAL | 5 refills | Status: DC | PRN

## 2019-01-31 NOTE — Telephone Encounter (Signed)
I informed pt's wife, [name] of Dr. Mellody Drown orders for Tramadol to be used at night and when not working, or driving. [name] states send to [facility].

## 2019-01-31 NOTE — Telephone Encounter (Signed)
The pt's wife has asked for the pt to be seen today for worse GERD and esophageal burning/discomfort.  I advised her that there are no doctors in the office today and if the pt is having worse pain he should go to urgent care or the ED for evaluation.  He previously was scheduled for a telehealth visit with Pyrtle on 6/8.  He wishes to keep that appt and his wife states that they may go to an urgent care.

## 2019-01-31 NOTE — Telephone Encounter (Signed)
Left message on machine to call back  

## 2019-01-31 NOTE — Addendum Note (Signed)
Addended by: Harriett Sine D on: 01/31/2019 09:35 AM   Modules accepted: Orders

## 2019-01-31 NOTE — Telephone Encounter (Signed)
I reviewed Allergies and pt is unable to take tramadol or vicodin due to itching. Dr. Paulla Dolly states regular strength tylenol as directed on the package. I informed pt's wife.

## 2019-01-31 NOTE — Telephone Encounter (Signed)
Pt wife called very upset she stated that her husband is in a lot of pain and need to see the doctor. She stated that they do not want to do a televisit because he needs to be seen asap. Please call thanks.

## 2019-02-03 ENCOUNTER — Other Ambulatory Visit: Payer: Self-pay | Admitting: Family Medicine

## 2019-02-03 ENCOUNTER — Ambulatory Visit (INDEPENDENT_AMBULATORY_CARE_PROVIDER_SITE_OTHER): Payer: Commercial Managed Care - PPO | Admitting: Family Medicine

## 2019-02-03 DIAGNOSIS — E66811 Obesity, class 1: Secondary | ICD-10-CM

## 2019-02-03 DIAGNOSIS — I10 Essential (primary) hypertension: Secondary | ICD-10-CM | POA: Diagnosis not present

## 2019-02-03 DIAGNOSIS — E669 Obesity, unspecified: Secondary | ICD-10-CM | POA: Diagnosis not present

## 2019-02-03 DIAGNOSIS — E785 Hyperlipidemia, unspecified: Secondary | ICD-10-CM

## 2019-02-03 DIAGNOSIS — K219 Gastro-esophageal reflux disease without esophagitis: Secondary | ICD-10-CM | POA: Diagnosis not present

## 2019-02-03 DIAGNOSIS — E119 Type 2 diabetes mellitus without complications: Secondary | ICD-10-CM

## 2019-02-03 DIAGNOSIS — K449 Diaphragmatic hernia without obstruction or gangrene: Secondary | ICD-10-CM

## 2019-02-03 DIAGNOSIS — R1013 Epigastric pain: Secondary | ICD-10-CM

## 2019-02-03 DIAGNOSIS — Z6831 Body mass index (BMI) 31.0-31.9, adult: Secondary | ICD-10-CM

## 2019-02-03 MED ORDER — PANTOPRAZOLE SODIUM 40 MG PO TBEC: 40.0000 mg | DELAYED_RELEASE_TABLET | Freq: Two times a day (BID) | ORAL | 3 refills | Status: DC

## 2019-02-03 MED ORDER — METFORMIN HCL 500 MG PO TABS: 500.0000 mg | ORAL_TABLET | Freq: Every day | ORAL | 1 refills | Status: DC

## 2019-02-04 ENCOUNTER — Other Ambulatory Visit: Payer: Self-pay

## 2019-02-04 NOTE — Telephone Encounter (Signed)
Can offer in person visit on 02/13/2019 at 11:30 AM

## 2019-02-04 NOTE — Telephone Encounter (Signed)
Patient prefers in office visit on 02/13/19 at 11:30 am. I have scheduled that appointment.

## 2019-02-04 NOTE — Progress Notes (Signed)
Patient is here today with complaint of chronic right posterior heel pain.  This is been ongoing for several months, he has tried physical therapy, immobilization, injections, oral anti-inflammatories, and shoe gear modification.  He states he is not had relief from any of these therapies so far. He states also that he had extreme flare up pain for 2 days post treatment.   Pain on palpation to right posterior heel, with noted swelling.  ESWT administered for 4.5 J and tolerated well.  Calf was also treated with a epat.  He is to follow-up in 2 weeks for fifth treatment.  He is to avoid NSAIDs and ice, and utilize his boot.

## 2019-02-05 NOTE — Assessment & Plan Note (Signed)
Encouraged heart healthy diet, increase exercise, avoid trans fats, consider a krill oil cap daily 

## 2019-02-05 NOTE — Assessment & Plan Note (Signed)
no changes to meds. Encouraged heart healthy diet such as the DASH diet and exercise as tolerated.  

## 2019-02-05 NOTE — Progress Notes (Signed)
Virtual Visit via telephone Note  I connected with Alexa E Cordoba on 02/05/19 at  8:40 AM EDT by a telephone enabled telemedicine application and verified that I am speaking with the correct person using two identifiers. Magdalene Molly, CMA attempted to set patient up with video platform but was unable to complete so completed via telephone  Location: Patient: home Provider: home   I discussed the limitations of evaluation and management by telemedicine and the availability of in person appointments. The patient expressed understanding and agreed to proceed.    Subjective:    Patient ID: [name] Foster, male    DOB: 1966-02-10, 53 y.o.   MRN: [id number]  No chief complaint on file.   HPI Patient is in today for evaluation of persistent reflux that has worsened recently. Notes daily symptoms despite taking Omeprazole bid and Famotidine qhs. Notes abdominal and chest pain intermittently as a result. No recent febrile illness or hospitalizations. He notes his sugars have stable. Denies CP/palp/SOB/HA/congestion/fevers or GU c/o. Taking meds as prescribed  Past Medical History:  Diagnosis Date  . Bone spur    left foot  . Chest pain   . Diabetes mellitus type 2 in obese ([place]) 07/13/2013  . Diverticulosis   . Erectile dysfunction 01/15/2013  . Esophageal reflux 01/15/2013  . Fatty liver 07/04/2015  . GERD (gastroesophageal reflux disease)   . Hiatal hernia   . Hiatal hernia with gastroesophageal reflux 03/20/2014  . Hyperglycemia 07/13/2013  . Hyperplastic colon polyp   . Hypertension   . Internal hemorrhoids   . Mixed hyperlipidemia   . Murmur   . OSA (obstructive sleep apnea)    s/p UPPP  . Plantar fasciitis   . Prediabetes   . Reflux esophagitis   . Stomach ulcer    from PCP  . Urinary hesitancy 09/30/2015    Past Surgical History:  Procedure Laterality Date  . [place] STUDY N/A 02/28/2016   Procedure: [place] STUDY;  Surgeon: Jerene Bears, MD;  Location: [facility];   Service: Gastroenterology;  Laterality: N/A;  . ESOPHAGEAL MANOMETRY N/A 09/01/2013   Procedure: ESOPHAGEAL MANOMETRY (EM);  Surgeon: Sable Feil, MD;  Location: [facility];  Service: Endoscopy;  Laterality: N/A;  . ESOPHAGEAL MANOMETRY N/A 02/28/2016   Procedure: ESOPHAGEAL MANOMETRY (EM);  Surgeon: Jerene Bears, MD;  Location: [facility];  Service: Gastroenterology;  Laterality: N/A;  . INGUINAL HERNIA REPAIR    . TONSILLECTOMY    . UMBILICAL HERNIA REPAIR    . UVULECTOMY      Family History  Problem Relation Age of Onset  . Dementia Mother   . Diabetes Mother   . Hypertension Mother   . Heart failure Mother   . Stroke Mother   . Obesity Mother   . Hypertension Other        siblings  . Diabetes Sister   . Cancer Brother        lung cancer smoker  . Heart attack Maternal Grandmother   . Diabetes Sister   . Pancreatic disease Sister   . Obesity Father   . Colon cancer Neg Hx     Social History   Socioeconomic History  . Marital status: Married    Spouse name: [name]  . Number of children: Not on file  . Years of education: Not on file  . Highest education level: Not on file  Occupational History  . Occupation: Nature conservation officer: [employer]  .  Financial resource strain: Not on file  . Food insecurity:    Worry: Not on file    Inability: Not on file  . Transportation needs:    Medical: Not on file    Non-medical: Not on file  Tobacco Use  . Smoking status: Never Smoker  . Smokeless tobacco: Never Used  Substance and Sexual Activity  . Alcohol use: No  . Drug use: No  . Sexual activity: Yes    Partners: Female  Lifestyle  . Physical activity:    Days per week: Not on file    Minutes per session: Not on file  . Stress: Not on file  Relationships  . Social connections:    Talks on phone: Not on file    Gets together: Not on file    Attends religious service: Not on file    Active member of club or organization:  Not on file    Attends meetings of clubs or organizations: Not on file    Relationship status: Not on file  . Intimate partner violence:    Fear of current or ex partner: Not on file    Emotionally abused: Not on file    Physically abused: Not on file    Forced sexual activity: Not on file  Other Topics Concern  . Not on file  Social History Narrative   Tobacco Use - No.    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 acetaminophen (TYLENOL) 500 MG tablet Take 1 tablet (500 mg total) by mouth every 6 (six) hours as needed. 60 tablet 0  . acetaminophen (TYLENOL) 500 MG tablet Take 1 tablet (500 mg total) by mouth every 6 (six) hours as needed. 30 tablet 5  . albuterol (PROVENTIL HFA;VENTOLIN HFA) 108 (90 Base) MCG/ACT inhaler Inhale 2 puffs into the lungs every 6 (six) hours as needed for wheezing or shortness of breath. 1 Inhaler 0  . amLODipine (NORVASC) 5 MG tablet Take 1 tablet by mouth once daily 90 tablet 1  . aspirin EC 81 MG tablet Take 81 mg by mouth daily.    . baclofen (LIORESAL) 10 MG tablet Take 0.5-1 tablets (5-10 mg total) by mouth at bedtime as needed for muscle spasms. 30 each 3  . BAYER MICROLET LANCETS lancets Test blood sugars twice daily 100 each 0  . Blood Glucose Monitoring Suppl (CONTOUR NEXT MONITOR) w/Device KIT 1 kit by Does not apply route 2 (two) times daily. 1 kit 0  . celecoxib (CELEBREX) 100 MG capsule Take 1 capsule (100 mg total) by mouth 2 (two) times daily. For 2 weeks 30 capsule 0  . etodolac (LODINE) 400 MG tablet Take 1 tablet (400 mg total) by mouth 2 (two) times daily as needed. 60 tablet 3  . famotidine (PEPCID) 40 MG tablet  Take 1 tablet (40 mg total) by mouth daily. 30 tablet 4  . fluticasone (FLONASE) 50 MCG/ACT nasal spray Place 2 sprays into both nostrils daily. 16 g 6  . glucose blood (  CONTOUR NEXT TEST) test strip Test blood sugars twice daily 100 each 0  . hyoscyamine (LEVSIN SL) 0.125 MG SL tablet Place 1 tablet (0.125 mg total) under the tongue every 4 (four) hours as needed. 30 tablet 5  . metFORMIN (GLUCOPHAGE) 500 MG tablet Take 1 tablet (500 mg total) by mouth daily with breakfast. 90 tablet 1  . Multiple Vitamin (MULTIVITAMIN) tablet Take 1 tablet by mouth daily.    . pantoprazole (PROTONIX) 40 MG tablet Take 1 tablet (40 mg total) by mouth 2 (two) times daily. 30 tablet 3  . rosuvastatin (CRESTOR) 10 MG tablet Take 1 tablet (10 mg total) by mouth daily. 30 tablet 1  . tadalafil (CIALIS) 5 MG tablet Take 1 tablet (5 mg total) by mouth daily as needed for erectile dysfunction. 30 tablet 5  . triamterene-hydrochlorothiazide (MAXZIDE-25) 37.5-25 MG tablet TAKE 1 TABLET BY MOUTH ONCE DAILY 90 tablet 1  . Vitamin D, Ergocalciferol, (DRISDOL) 1.25 MG (50000 UT) CAPS capsule TAKE 1 CAPSULE BY MOUTH ONCE A WEEK FOR  12  WEEKS 4 capsule 0   No facility-administered medications prior to visit.     Allergies  Allergen Reactions  . Hydrocodone Itching  . Tramadol Itching    Review of Systems  Constitutional: Negative for fever and malaise/fatigue.  HENT: Negative for congestion.   Eyes: Negative for blurred vision.  Respiratory: Negative for shortness of breath.   Cardiovascular: Positive for chest pain. Negative for palpitations and leg swelling.  Gastrointestinal: Positive for abdominal pain, heartburn and nausea. Negative for blood in stool.  Genitourinary: Negative for dysuria and frequency.  Musculoskeletal: Negative for falls.  Skin: Negative for rash.  Neurological: Negative for dizziness, loss of consciousness and headaches.  Endo/Heme/Allergies: Negative for environmental allergies.   Psychiatric/Behavioral: Negative for depression. The patient is not nervous/anxious.        Objective:    Physical Exam unable to obtain via telephone   There were no vitals taken for this visit. Wt Readings from Last 3 Encounters:  01/22/19 262 lb (118.8 kg)  11/27/18 262 lb 1.9 oz (118.9 kg)  11/26/18 266 lb 12.8 oz (121 kg)    Diabetic Foot Exam - Simple   No data filed     Lab Results  Component Value Date   WBC 6.4 10/14/2018   HGB 16.2 10/14/2018   HCT 48.8 10/14/2018   PLT 248.0 10/14/2018   GLUCOSE 85 10/14/2018   CHOL 207 (H) 10/14/2018   TRIG 58.0 10/14/2018   HDL 47.20 10/14/2018   LDLDIRECT 162.0 01/10/2018   LDLCALC 148 (H) 10/14/2018   ALT 41 10/14/2018   AST 25 10/14/2018   NA 139 10/14/2018   K 3.8 10/14/2018   CL 102 10/14/2018   CREATININE 0.91 10/14/2018   BUN 15 10/14/2018   CO2 26 10/14/2018   TSH 1.54 10/14/2018   PSA 2.39 09/21/2016   HGBA1C 6.3 10/14/2018    Lab Results  Component Value Date   TSH 1.54 10/14/2018   Lab Results  Component Value Date   WBC 6.4 10/14/2018   HGB 16.2 10/14/2018   HCT 48.8 10/14/2018   MCV 87.0 10/14/2018   PLT 248.0 10/14/2018   Lab Results  Component Value Date   NA 139 10/14/2018   K 3.8 10/14/2018   CO2 26 10/14/2018   GLUCOSE 85 10/14/2018   BUN 15 10/14/2018   CREATININE 0.91 10/14/2018   BILITOT 0.5 10/14/2018   ALKPHOS 73 10/14/2018   AST 25 10/14/2018  ALT 41 10/14/2018   PROT 7.7 10/14/2018   ALBUMIN 4.7 10/14/2018   CALCIUM 9.8 10/14/2018   GFR 106.29 10/14/2018   Lab Results  Component Value Date   CHOL 207 (H) 10/14/2018   Lab Results  Component Value Date   HDL 47.20 10/14/2018   Lab Results  Component Value Date   LDLCALC 148 (H) 10/14/2018   Lab Results  Component Value Date   TRIG 58.0 10/14/2018   Lab Results  Component Value Date   CHOLHDL 4 10/14/2018   Lab Results  Component Value Date   HGBA1C 6.3 10/14/2018       Assessment & Plan:    Problem List Items Addressed This Visit    Hyperlipidemia    Encouraged heart healthy diet, increase exercise, avoid trans fats, consider a krill oil cap daily      Essential hypertension     no changes to meds. Encouraged heart healthy diet such as the DASH diet and exercise as tolerated.       Obesity    Encouraged DASH diet, decrease po intake and increase exercise as tolerated. Needs 7-8 hours of sleep nightly. Avoid trans fats, eat small, frequent meals every 4-5 hours with lean proteins, complex carbs and healthy fats. Minimize simple carbs, weight loss would help heartburn      Hiatal hernia with gastroesophageal reflux    Persistent symptoms despite Omeprazole bid and Famotidine qhs and still has frequent symptoms and chest pain. Will switch to Pantoprazole bid and continue famotidine. May use Tums prn and has an appointment with gastroenterology next week. Avoid offending foods, consider probiotics. Do not eat large meals in late evening and consider raising head of bed.       Type 2 diabetes mellitus without complication, without long-term current use of insulin ([name])    hgba1c acceptable, minimize simple carbs. Increase exercise as tolerated. Continue current meds         I am having Menno E. Payment maintain his aspirin EC, multivitamin, acetaminophen, Contour Next Monitor, glucose blood, Bayer Microlet Lancets, albuterol, tadalafil, hyoscyamine, fluticasone, triamterene-hydrochlorothiazide, etodolac, baclofen, famotidine, rosuvastatin, celecoxib, Vitamin D (Ergocalciferol), amLODipine, acetaminophen, pantoprazole, and metFORMIN.  No orders of the defined types were placed in this encounter.  I discussed the assessment and treatment plan with the patient. The patient was provided an opportunity to ask questions and all were answered. The patient agreed with the plan and demonstrated an understanding of the instructions.   The patient was advised to call back or seek an  in-person evaluation if the symptoms worsen or if the condition fails to improve as anticipated.  I provided 25 minutes of non-face-to-face time during this encounter.   Penni Homans, MD

## 2019-02-05 NOTE — Assessment & Plan Note (Signed)
Encouraged DASH diet, decrease po intake and increase exercise as tolerated. Needs 7-8 hours of sleep nightly. Avoid trans fats, eat small, frequent meals every 4-5 hours with lean proteins, complex carbs and healthy fats. Minimize simple carbs, weight loss would help heartburn

## 2019-02-05 NOTE — Assessment & Plan Note (Signed)
hgba1c acceptable, minimize simple carbs. Increase exercise as tolerated. Continue current meds 

## 2019-02-05 NOTE — Assessment & Plan Note (Signed)
Persistent symptoms despite Omeprazole bid and Famotidine qhs and still has frequent symptoms and chest pain. Will switch to Pantoprazole bid and continue famotidine. May use Tums prn and has an appointment with gastroenterology next week. Avoid offending foods, consider probiotics. Do not eat large meals in late evening and consider raising head of bed.

## 2019-02-10 ENCOUNTER — Other Ambulatory Visit: Payer: Self-pay

## 2019-02-10 ENCOUNTER — Telehealth: Payer: Self-pay

## 2019-02-10 ENCOUNTER — Telehealth: Payer: Self-pay | Admitting: *Deleted

## 2019-02-10 DIAGNOSIS — Z20822 Contact with and (suspected) exposure to covid-19: Secondary | ICD-10-CM

## 2019-02-10 NOTE — Telephone Encounter (Signed)
Pt called and scheduled for COVID-19 testing per request of Dr. Charlett Blake. Pt scheduled on 02/10/19 at 12:30 at the [facility] site. Pt advised to wear a mask and to remain in the car at the time of the appt. Understanding verbalized.

## 2019-02-10 NOTE — Telephone Encounter (Signed)
Copied from [place] [id number]. Topic: General - Other >> Feb 06, 2019 12:22 PM Carolyn Stare wrote:  Pt call to say that Dr Charlett Blake though it may be a good idea if he has the COVID test and if the office need to call NT     Nurse triage Spoke with Dr. Charlett Blake she stated yes patient can have COVID testing due to sore throat, coughing and chest pain.  pc

## 2019-02-10 NOTE — Telephone Encounter (Signed)
Pt called and testing scheduled on 02/10/19 at [facility] site.

## 2019-02-11 LAB — NOVEL CORONAVIRUS, NAA: SARS-CoV-2, NAA: NOT DETECTED

## 2019-02-13 ENCOUNTER — Encounter: Payer: Self-pay | Admitting: Podiatry

## 2019-02-13 ENCOUNTER — Ambulatory Visit (INDEPENDENT_AMBULATORY_CARE_PROVIDER_SITE_OTHER): Payer: Commercial Managed Care - PPO | Admitting: Internal Medicine

## 2019-02-13 ENCOUNTER — Encounter: Payer: Self-pay | Admitting: Internal Medicine

## 2019-02-13 ENCOUNTER — Other Ambulatory Visit: Payer: Self-pay

## 2019-02-13 VITALS — BP 122/84 | HR 78 | Temp 98.2°F | Resp 20 | Ht 72.0 in | Wt 269.0 lb

## 2019-02-13 DIAGNOSIS — R0789 Other chest pain: Secondary | ICD-10-CM | POA: Diagnosis not present

## 2019-02-13 DIAGNOSIS — R12 Heartburn: Secondary | ICD-10-CM | POA: Diagnosis not present

## 2019-02-13 DIAGNOSIS — K219 Gastro-esophageal reflux disease without esophagitis: Secondary | ICD-10-CM

## 2019-02-13 MED ORDER — AMITRIPTYLINE HCL 25 MG PO TABS: 25.0000 mg | ORAL_TABLET | Freq: Every day | ORAL | 2 refills | Status: DC

## 2019-02-13 MED ORDER — OMEPRAZOLE 40 MG PO CPDR: 40.0000 mg | DELAYED_RELEASE_CAPSULE | ORAL | 3 refills | Status: DC

## 2019-02-13 NOTE — Addendum Note (Signed)
Addended by: Larina Bras on: 02/13/2019 02:04 PM   Modules accepted: Orders

## 2019-02-13 NOTE — Progress Notes (Signed)
Subjective:    Patient ID: [name] Foster, male    DOB: September 19, 1965, 53 y.o.   MRN: [id number]  HPI [name] Foster is a 53 year old male with a history of GERD with functional heartburn and esophageal hypersensitivity, atypical chest pain, hypertension, hyperlipidemia and sleep apnea here for follow-up.  He was last seen in the office on 11/21/2016.  He presents for an in person office visit today.  He has had significant upper GI testing including EGD, manometry and 24-hour impedance.  His atypical chest pain is felt secondary to esophageal hypersensitivity and functional heartburn.  Of late he has been having 2 to 3 months of a different pain under his right nipple/pectoral muscle.  This is aching.  Can be worse at night.  Tums seem to help it a little bit.  Worse with deep breath.  Separate from this his chronic atypical center of the chest discomfort still comes and goes.  Heartburn is been mostly controlled.  Most recently he is using omeprazole 40 mg in the morning, occasionally in the evening and taking a 40 mg famotidine in the daytime.  No nausea or vomiting.  No dysphagia.  No change in his bowel habit.  No blood in his stool or melena  He did recently have a negative COVID-19 swab  Review of Systems As per HPI, otherwise negative  Current Medications, Allergies, Past Medical History, Past Surgical History, Family History and Social History were reviewed in [employer] record.     Objective:   Physical Exam  BP 122/84 (BP Location: Left Arm, Patient Position: Sitting, Cuff Size: Large)   Pulse 78   Temp 98.2 F (36.8 C) (Oral)   Resp 20   Ht 6' (1.829 m)   Wt 269 lb (122 kg)   SpO2 98%   BMI 36.48 kg/m  Constitutional: Well-developed and well-nourished. No distress. HEENT: Normocephalic and atraumatic.  Conjunctivae are normal.  No scleral icterus. Neck: Neck supple. Trachea midline. Cardiovascular: Normal rate, regular rhythm and intact distal pulses.  No M/R/G, there is chest wall pain below the nipple to palpation anteriorly Pulmonary/chest: Effort normal and breath sounds normal. No wheezing, rales or rhonchi. Abdominal: Soft, nontender, nondistended. Bowel sounds active throughout. There are no masses palpable. No hepatosplenomegaly. Extremities: no clubbing, cyanosis, or edema Neurological: Alert and oriented to person place and time. Skin: Skin is warm and dry. Psychiatric: Normal mood and affect. Behavior is normal.  CBC    Component Value Date/Time   WBC 6.4 10/14/2018 1114   RBC 5.61 10/14/2018 1114   HGB 16.2 10/14/2018 1114   HGB 15.8 11/27/2016 1158   HCT 48.8 10/14/2018 1114   HCT 46.5 11/27/2016 1158   PLT 248.0 10/14/2018 1114   MCV 87.0 10/14/2018 1114   MCV 85 11/27/2016 1158   MCH 28.7 11/27/2016 1158   MCH 28.7 05/05/2014 1518   MCHC 33.2 10/14/2018 1114   RDW 14.1 10/14/2018 1114   RDW 14.1 11/27/2016 1158   LYMPHSABS 1.8 01/10/2018 1738   LYMPHSABS 1.8 11/27/2016 1158   MONOABS 0.3 01/10/2018 1738   EOSABS 0.1 01/10/2018 1738   EOSABS 0.1 11/27/2016 1158   BASOSABS 0.0 01/10/2018 1738   BASOSABS 0.0 11/27/2016 1158   CMP     Component Value Date/Time   NA 139 10/14/2018 1114   NA 141 11/27/2016 1158   K 3.8 10/14/2018 1114   CL 102 10/14/2018 1114   CO2 26 10/14/2018 1114   GLUCOSE 85 10/14/2018 1114  BUN 15 10/14/2018 1114   BUN 17 11/27/2016 1158   CREATININE 0.91 10/14/2018 1114   CREATININE 1.08 05/05/2014 1518   CALCIUM 9.8 10/14/2018 1114   PROT 7.7 10/14/2018 1114   PROT 7.4 11/27/2016 1158   ALBUMIN 4.7 10/14/2018 1114   ALBUMIN 4.6 11/27/2016 1158   AST 25 10/14/2018 1114   ALT 41 10/14/2018 1114   ALKPHOS 73 10/14/2018 1114   BILITOT 0.5 10/14/2018 1114   BILITOT 0.4 11/27/2016 1158   GFRNONAA 95 11/27/2016 1158   GFRNONAA >60 01/04/2011 0911   GFRAA 110 11/27/2016 1158   GFRAA >60 01/04/2011 0911   CHEST - 2 VIEW   COMPARISON:  Chest x-ray dated December 06, 2017.    FINDINGS: The heart size and mediastinal contours are within normal limits. Both lungs are clear. The visualized skeletal structures are unremarkable.   IMPRESSION: No active cardiopulmonary disease.     Electronically Signed   By: Titus Dubin M.D.   On: 11/26/2018 10:46        Assessment & Plan:  53 year old male with a history of GERD with functional heartburn and esophageal hypersensitivity, atypical chest pain, hypertension, hyperlipidemia and sleep apnea here for follow-up.  1.  GERD/atypical chest pain/functional heartburn/right chest wall pain --we spent time today discussing his symptoms and prior work-up.  I would like to clarify and help him better time the medication of his antacids.  He also previously tried amitriptyline but has been off of this medication for unclear reasons.  He did not have an allergy to it.  I explained that amitriptyline can help with the functional nature of his atypical chest pain and heartburn.  I also think that it is possible that he has a costochondritis or chest wall pain due to the pleuritic component and pain with palpation I recommended the following: --Use omeprazole 40 mg in the morning, 30 minutes before breakfast --Move famotidine 40 mg to late evening (this is a 12-hour drug and we discussed this) --Restart amitriptyline 25 mg nightly; expect he will need titration to 50 mg.  I asked that he contact me in 3 to 4 weeks to let me know if symptoms are improving. --Possible Pred pack for costochondritis chest wall pain, I am reaching out to Dr. Maryellen Pile to see if she feels this is reasonable trial --I will hear back from the patient in about a month and we can go from there regarding his symptoms

## 2019-02-13 NOTE — Patient Instructions (Addendum)
We have sent the following medications to your pharmacy for you to pick up at your convenience: Omeprazole 40 mg every morning Amitriptyline 25 mg every night  Lets change the way you take your famotidine. Start taking famotidine 40 mg to late every evening.  Discontinue any pantoprazole or ranitidine you may have.  Please call our office back in 3-4 weeks with a symptom update. [phone].

## 2019-02-14 ENCOUNTER — Ambulatory Visit (INDEPENDENT_AMBULATORY_CARE_PROVIDER_SITE_OTHER): Payer: Self-pay | Admitting: *Deleted

## 2019-02-14 VITALS — Temp 98.4°F

## 2019-02-14 DIAGNOSIS — M7661 Achilles tendinitis, right leg: Secondary | ICD-10-CM

## 2019-02-14 DIAGNOSIS — M79676 Pain in unspecified toe(s): Secondary | ICD-10-CM

## 2019-02-14 NOTE — Progress Notes (Signed)
Patient presents with chronic right posterior heel pain.  This is been ongoing for several months. He has tried physical therapy, immobilization, injections, oral anti-inflammatories, and shoe gear modification. He states he is not had relief from any of these therapies so far. Patient states he has had little to no improvements with the EPAT treatments.    Pain on palpation to right posterior heel, more laterally.  ESWT administered and tolerated well. Calf was treated with massage tool. This is his 4th treatment.  Final settings at 3000 shocks:  Energy .20  Frequency 5.0  Joules 19.15  Calf massage x 3 rounds.  He is to avoid NSAIDs and ice and utilize his boot. He will follow up with Dr. Paulla Dolly in 2 weeks for re-evaluation.

## 2019-02-17 ENCOUNTER — Ambulatory Visit: Payer: Commercial Managed Care - PPO | Admitting: Internal Medicine

## 2019-02-21 ENCOUNTER — Other Ambulatory Visit: Payer: Self-pay | Admitting: Cardiology

## 2019-02-28 ENCOUNTER — Ambulatory Visit: Payer: Commercial Managed Care - PPO | Admitting: Podiatry

## 2019-02-28 ENCOUNTER — Other Ambulatory Visit: Payer: Self-pay

## 2019-02-28 ENCOUNTER — Encounter: Payer: Self-pay | Admitting: Podiatry

## 2019-02-28 VITALS — Temp 97.9°F

## 2019-02-28 DIAGNOSIS — M7661 Achilles tendinitis, right leg: Secondary | ICD-10-CM

## 2019-02-28 NOTE — Patient Instructions (Signed)
Pre-Operative Instructions  Congratulations, you have decided to take an important step towards improving your quality of life.  You can be assured that the doctors and staff at [facility] will be with you every step of the way.  Here are some important things you should know:  1. Plan to be at the surgery center/hospital at least 1 (one) hour prior to your scheduled time, unless otherwise directed by the surgical center/hospital staff.  You must have a responsible adult accompany you, remain during the surgery and drive you home.  Make sure you have directions to the surgical center/hospital to ensure you arrive on time. 2. If you are having surgery at [facility] or [facility], you will need a copy of your medical history and physical form from your family physician within one month prior to the date of surgery. We will give you a form for your primary physician to complete.  3. We make every effort to accommodate the date you request for surgery.  However, there are times where surgery dates or times have to be moved.  We will contact you as soon as possible if a change in schedule is required.   4. No aspirin/ibuprofen for one week before surgery.  If you are on aspirin, any non-steroidal anti-inflammatory medications (Mobic, Aleve, Ibuprofen) should not be taken seven (7) days prior to your surgery.  You make take Tylenol for pain prior to surgery.  5. Medications - If you are taking daily heart and blood pressure medications, seizure, reflux, allergy, asthma, anxiety, pain or diabetes medications, make sure you notify the surgery center/hospital before the day of surgery so they can tell you which medications you should take or avoid the day of surgery. 6. No food or drink after midnight the night before surgery unless directed otherwise by surgical center/hospital staff. 7. No alcoholic beverages 24-hours prior to surgery.  No smoking 24-hours prior or 24-hours after  surgery. 8. Wear loose pants or shorts. They should be loose enough to fit over bandages, boots, and casts. 9. Don't wear slip-on shoes. Sneakers are preferred. 10. Bring your boot with you to the surgery center/hospital.  Also bring crutches or a walker if your physician has prescribed it for you.  If you do not have this equipment, it will be provided for you after surgery. 11. If you have not been contacted by the surgery center/hospital by the day before your surgery, call to confirm the date and time of your surgery. 12. Leave-time from work may vary depending on the type of surgery you have.  Appropriate arrangements should be made prior to surgery with your employer. 13. Prescriptions will be provided immediately following surgery by your doctor.  Fill these as soon as possible after surgery and take the medication as directed. Pain medications will not be refilled on weekends and must be approved by the doctor. 14. Remove nail polish on the operative foot and avoid getting pedicures prior to surgery. 15. Wash the night before surgery.  The night before surgery wash the foot and leg well with water and the antibacterial soap provided. Be sure to pay special attention to beneath the toenails and in between the toes.  Wash for at least three (3) minutes. Rinse thoroughly with water and dry well with a towel.  Perform this wash unless told not to do so by your physician.  Enclosed: 1 Ice pack (please put in freezer the night before surgery)   1 Hibiclens skin cleaner     Pre-op instructions  If you have any questions regarding the instructions, please do not hesitate to call our office.  [place]: [address], [place], [place] [address] -- [phone]  [place]: [address] [place], [place] [address] -- [phone]  [place]: [address]  [place], [place] [address] -- [phone]  [place]: [address], [address], [place], [place] [address] -- [phone]  Website:  [email or web address] 

## 2019-02-28 NOTE — Progress Notes (Signed)
Subjective:   Patient ID: [name] Foster, male   DOB: 53 y.o.   MRN: [id number]   HPI Patient states that his heel was absolutely killing him and that nothing has helped and that shockwave did not improve it and it is impossible for him to be active or work at the current time and he knows that he needs something done.   ROS      Objective:  Physical Exam  Neurovascular status intact with patient found to have chronic Achilles tendinitis right that we have been working on extensively and is not responded to numerous conservative treatments including prolonged immobilization.  Patient states that his sugars been running excellent and that his average fasting sugars are averaging 100 at this time     Assessment:  Neurovascular status found to be intact with patient found to have severe posterior heel pain right at the insertion calcaneus along with tight Achilles tendon and gastrocnemius muscle     Plan:  Chronic Achilles tendinitis right with large bone spur formation that is not respond to numerous conservative treatments.  At this point due to the chronic nature of this condition and his failure to respond to conservative treatment we went ahead and we discussed surgical intervention with resection of posterior spur repositioning of the Achilles tendon and gastrocnemius lengthening.  Patient wants surgery understanding all risk and understanding this a very difficult procedure and after extensive review of consent form he did sign it understanding also the possibility for dehiscence of the incision site with this procedure which can occur secondary to the structure of the posterior heel.  He understands this completely and understands risk signed consent form and is given all preoperative instructions and understands he will be nonweightbearing for 4 weeks total recovery to take approximately 6 months to 1 year with no long-term guarantees.  Patient scheduled for Achilles tendon repositioning  with anchor system along with posterior heel resection and gastrocnemius resection

## 2019-03-03 ENCOUNTER — Telehealth: Payer: Self-pay | Admitting: *Deleted

## 2019-03-03 NOTE — Telephone Encounter (Signed)
"  My husband is scheduled for surgery on June 30.  He's supposed to register with the surgery center.  What's the link to do that?"  It is [email or web address].  "Is there any other instructions?"  You select the pre-registration tab then the [employer] link.  You accept the terms of use and click register.  This information is found in the brochure that we gave him.  "He misplaced it, doesn't know what he did with it.  That is why I'm calling.  Thank you for your help."

## 2019-03-04 ENCOUNTER — Telehealth: Payer: Self-pay | Admitting: *Deleted

## 2019-03-04 ENCOUNTER — Telehealth: Payer: Self-pay | Admitting: Internal Medicine

## 2019-03-04 NOTE — Telephone Encounter (Signed)
"  I'm calling on behalf of my husband, Makell Casebeer.  He's scheduled for surgery on June 30.  Does he need to send a medical clearance letter to you all or to the surgery center.  He had a stress test done recently."  Did the stress test reveal any problems.  "Oh no, no problems were found.  If you are in Epic, you can see the results."  Medical clearance is not required for surgeries done at [facility].  "Okay, I just wanted to make sure."

## 2019-03-04 NOTE — Telephone Encounter (Signed)
Pt reports that the amitriptyline is making him very sleepy so he stopped taking it. He is calling wanting to know if Dr. Hilarie Fredrickson will prescribe the steroid pack that he mentioned to him previously. Please advise.

## 2019-03-04 NOTE — Telephone Encounter (Signed)
Pt states that Amitriptyline is giving him side effects, he states that it makes him very sleepy so he stopped taking it 3 days ago, he wants to know if he can be put on steroid pack that Dr. Hilarie Fredrickson suggested before. pls call him.

## 2019-03-05 ENCOUNTER — Telehealth: Payer: Self-pay | Admitting: *Deleted

## 2019-03-05 MED ORDER — METHYLPREDNISOLONE 4 MG PO TBPK: ORAL_TABLET | ORAL | 0 refills | Status: DC

## 2019-03-05 NOTE — Telephone Encounter (Signed)
DOS 03/11/2019; 28413 - CALCANEAL OSTECTOMY, 24401 - TENOLYSIS, AND 02725 - GASTROCNEMIUS RECESS RIGHT FOOT  UMR: Effective Date - 11/10/2018  Benefit percentage  80%Plan pays  20%You pay  Individual deductible $0.00 to go  $750.00 out of $366.44   PRE-CERTIFICATION IS NOT REQUIRED

## 2019-03-05 NOTE — Telephone Encounter (Signed)
Spoke with pt and he is aware, script sent to pharmacy. 

## 2019-03-05 NOTE — Telephone Encounter (Signed)
Okay to try a Medrol Dosepak to see if his chest wall pain could be costochondritis Have him let me know if it helps Thanks

## 2019-03-10 ENCOUNTER — Telehealth: Payer: Self-pay | Admitting: *Deleted

## 2019-03-10 NOTE — Telephone Encounter (Signed)
"  My husband, [name] Foster, is scheduled to have surgery tomorrow.  We still haven't received a call about his arrival time yet."  They'll probably call him this afternoon but you can call them if you like.  Their phone number is [phone].  "Okay, thank you."

## 2019-03-11 ENCOUNTER — Telehealth: Payer: Self-pay

## 2019-03-11 ENCOUNTER — Telehealth: Payer: Self-pay | Admitting: Podiatry

## 2019-03-11 ENCOUNTER — Encounter: Payer: Self-pay | Admitting: Podiatry

## 2019-03-11 ENCOUNTER — Other Ambulatory Visit: Payer: Self-pay | Admitting: Sports Medicine

## 2019-03-11 DIAGNOSIS — M216X1 Other acquired deformities of right foot: Secondary | ICD-10-CM

## 2019-03-11 DIAGNOSIS — M7731 Calcaneal spur, right foot: Secondary | ICD-10-CM

## 2019-03-11 DIAGNOSIS — Z9889 Other specified postprocedural states: Secondary | ICD-10-CM

## 2019-03-11 DIAGNOSIS — M7661 Achilles tendinitis, right leg: Secondary | ICD-10-CM

## 2019-03-11 MED ORDER — OXYCODONE-ACETAMINOPHEN 10-325 MG PO TABS: 1.0000 | ORAL_TABLET | ORAL | 0 refills | Status: DC | PRN

## 2019-03-11 NOTE — Progress Notes (Signed)
Electronically sent percocet Rx since pharmacy would not accept the hand written version from Dr. Paulla Dolly.

## 2019-03-11 NOTE — Telephone Encounter (Signed)
error 

## 2019-03-12 ENCOUNTER — Telehealth: Payer: Self-pay

## 2019-03-12 ENCOUNTER — Telehealth: Payer: Self-pay | Admitting: Podiatry

## 2019-03-12 ENCOUNTER — Other Ambulatory Visit: Payer: Self-pay

## 2019-03-12 MED ORDER — IBUPROFEN 600 MG PO TABS: 600.0000 mg | ORAL_TABLET | Freq: Three times a day (TID) | ORAL | 0 refills | Status: DC | PRN

## 2019-03-12 NOTE — Telephone Encounter (Signed)
Pt wife called and asked could Dr. Paulla Dolly call in 600 ibuprofen  for patient for his severe pain. Patient would need a refill on his Percocet, pt insurance on paid for 21 pills. He is taking the medication every 4 hours and he wont have enough pain medication to last him until to Monday. Please send to [place] -   Please call wife  [phone].

## 2019-03-12 NOTE — Telephone Encounter (Signed)
Returned pt call to inform him that Ibuprofen 600mg  has been called in to pharmacy as well as refill on his percocet prescription. Pt had no other concerns and stated he was doing okay.

## 2019-03-13 ENCOUNTER — Other Ambulatory Visit: Payer: Self-pay | Admitting: Podiatry

## 2019-03-13 ENCOUNTER — Telehealth: Payer: Self-pay | Admitting: Podiatry

## 2019-03-13 ENCOUNTER — Telehealth: Payer: Self-pay | Admitting: *Deleted

## 2019-03-13 MED ORDER — IBUPROFEN 600 MG PO TABS: 600.0000 mg | ORAL_TABLET | Freq: Three times a day (TID) | ORAL | 0 refills | Status: DC | PRN

## 2019-03-13 MED ORDER — OXYCODONE-ACETAMINOPHEN 10-325 MG PO TABS: 1.0000 | ORAL_TABLET | ORAL | 0 refills | Status: DC | PRN

## 2019-03-13 NOTE — Telephone Encounter (Signed)
Patient needs pain medication sent to [facility] when possible

## 2019-03-13 NOTE — Progress Notes (Signed)
DOS 03/10/29 Posterior Heel Resection RT; Reposition Achilles Tendon RT; Gastroc Lengthening RT. Dr. Jacqualyn Posey assisted Dr. Paulla Dolly with this surgery.

## 2019-03-13 NOTE — Telephone Encounter (Signed)
Ibuprofen was sent to wrong pharmacy. Please send to [facility]

## 2019-03-13 NOTE — Telephone Encounter (Signed)
Pts wife called requesting a refill on the patients Oxycodone stating he will run out of the medication over the weekend and will not make it until his follow up appt on Monday, 03/17/19.   Prescription needs to be sent to [facility].   Please give the patients wife a call once the prescription has been sent or call if there are any issues.

## 2019-03-13 NOTE — Telephone Encounter (Signed)
Oxycodone was sent to the wrong pharmacy. Please change to [facility] and resubmit.   **Please call patient once it is changed and sent. **

## 2019-03-13 NOTE — Telephone Encounter (Signed)
Called patient at [phone] (Cell #) to check to see how they were doing from when they got surgery from [place] on Tuesday, March 11, 2019.  DOS 03/11/19 Posterior Heel Resection RT; Reposition Achilles Tendon RT; Gastroc Lengthening RT  Patient stated, "I am doing much better today; my pain medication if working and giving me some relief. I was vomiting but took Zofran and that helped. I have had no shortness of breath, fever or chills. My bandage is dry, intact and clean. My bandage has been tight". I told patient to go ahead and loosen the ace wrap, then re-wrap it up. Not to touch any of the guaze on his foot. Patient stated he understood.  Patient is wearing the boot and elevating his foot. He had no questions or concerns regarding his surgery.

## 2019-03-13 NOTE — Telephone Encounter (Signed)
Called wife and told her Rx was sent to [place] [facility].

## 2019-03-13 NOTE — Addendum Note (Signed)
Addended by: Roney Jaffe on: 03/13/2019 11:54 AM   Modules accepted: Orders

## 2019-03-13 NOTE — Telephone Encounter (Signed)
Dr. Cannon Kettle sent in medication

## 2019-03-14 ENCOUNTER — Other Ambulatory Visit: Payer: Self-pay | Admitting: Podiatry

## 2019-03-14 MED ORDER — OXYCODONE-ACETAMINOPHEN 10-325 MG PO TABS: 1.0000 | ORAL_TABLET | ORAL | 0 refills | Status: AC | PRN

## 2019-03-14 NOTE — Progress Notes (Signed)
Rx sent to wrong pharmacy.

## 2019-03-17 ENCOUNTER — Encounter: Payer: Self-pay | Admitting: Podiatry

## 2019-03-17 ENCOUNTER — Other Ambulatory Visit: Payer: Self-pay

## 2019-03-17 ENCOUNTER — Ambulatory Visit (INDEPENDENT_AMBULATORY_CARE_PROVIDER_SITE_OTHER): Payer: Commercial Managed Care - PPO | Admitting: Podiatry

## 2019-03-17 ENCOUNTER — Ambulatory Visit (INDEPENDENT_AMBULATORY_CARE_PROVIDER_SITE_OTHER): Payer: Commercial Managed Care - PPO

## 2019-03-17 VITALS — Temp 98.2°F

## 2019-03-17 DIAGNOSIS — Z9889 Other specified postprocedural states: Secondary | ICD-10-CM

## 2019-03-17 DIAGNOSIS — M7662 Achilles tendinitis, left leg: Secondary | ICD-10-CM

## 2019-03-17 DIAGNOSIS — M7661 Achilles tendinitis, right leg: Secondary | ICD-10-CM

## 2019-03-17 NOTE — Progress Notes (Signed)
Subjective:   Patient ID: [name] Foster, male   DOB: 53 y.o.   MRN: [id number]   HPI Patient states overall he is doing pretty good but he did step down on his foot without his boot this morning and knows that he was supposed to wear his boot at all times   ROS      Objective:  Physical Exam  Neurovascular status intact negative Homans sign noted with thorough check of the Achilles and it was found to be intact with incision sites healing well wound edges well coapted and significant reduction of discomfort from preoperative analysis with improvement of equinus condition     Assessment:  Overall doing well post tenolysis spur removal and gastroc recession     Plan:  H&P condition reviewed sterile dressing reapplied and discussed continued immobilization elevation compression and boot usage 24 hours a day.  Reappoint 2 weeks or earlier if needed  X-ray indicates satisfactory resection of posterior spurring with no pathology noted

## 2019-03-26 ENCOUNTER — Telehealth: Payer: Self-pay | Admitting: Podiatry

## 2019-03-26 MED ORDER — IBUPROFEN 600 MG PO TABS: 600.0000 mg | ORAL_TABLET | Freq: Three times a day (TID) | ORAL | 0 refills | Status: DC | PRN

## 2019-03-26 NOTE — Addendum Note (Signed)
Addended by: Harriett Sine D on: 03/26/2019 03:37 PM   Modules accepted: Orders

## 2019-03-26 NOTE — Telephone Encounter (Signed)
Pt called requesting a refill of Ibuprofen 600mg .  Please send prescription to [facility].

## 2019-03-31 ENCOUNTER — Ambulatory Visit (INDEPENDENT_AMBULATORY_CARE_PROVIDER_SITE_OTHER): Payer: Commercial Managed Care - PPO

## 2019-03-31 ENCOUNTER — Ambulatory Visit (INDEPENDENT_AMBULATORY_CARE_PROVIDER_SITE_OTHER): Payer: Self-pay | Admitting: Podiatry

## 2019-03-31 ENCOUNTER — Other Ambulatory Visit: Payer: Self-pay

## 2019-03-31 ENCOUNTER — Encounter: Payer: Self-pay | Admitting: Podiatry

## 2019-03-31 VITALS — Temp 97.2°F

## 2019-03-31 DIAGNOSIS — M7661 Achilles tendinitis, right leg: Secondary | ICD-10-CM

## 2019-04-01 ENCOUNTER — Telehealth: Payer: Self-pay | Admitting: Podiatry

## 2019-04-01 NOTE — Progress Notes (Signed)
Subjective:   Patient ID: [name] Foster, male   DOB: 53 y.o.   MRN: [id number]   HPI Patient states overall doing really well with only mild pain and states that the knot is continue to go down and there is mild numbness   ROS      Objective:  Physical Exam  Neurovascular status intact negative Homans sign noted with well-healed surgical site posterior aspect right heel and calf from gastroc recession along with posterior heel resection and tenolysis procedure.  Patient's wound edges are coapted well no drainage is noted and stitches intact     Assessment:  Doing well post heel surgery right and gastroc surgery right      Plan:  Reviewed condition and x-rays.  Today I went ahead and remove stitches and left a portion intact in the distal portion of the heel procedure as a precautionary measure with no gapping noted.  I advised on continued elevation compression and for the most part immobilization with gradual weightbearing only with boot.  Patient will be checked back 2 weeks or earlier if needed  X-rays indicate satisfactory resection of bone with good alignment and no indication of current spur formation

## 2019-04-01 NOTE — Telephone Encounter (Signed)
Pt is scheduled for therapy Monday, 27 July at 3:00 pm per Jinny Blossom at [facility].

## 2019-04-07 ENCOUNTER — Telehealth: Payer: Self-pay | Admitting: Podiatry

## 2019-04-07 NOTE — Telephone Encounter (Signed)
[facility] is calling to get clarification on the orders for the patient. Please give them a call.

## 2019-04-07 NOTE — Telephone Encounter (Signed)
Unable to leave a message or speak with [facility] no answering service.

## 2019-04-08 NOTE — Telephone Encounter (Signed)
Gradual increase in dorsiflexion and edema reduction

## 2019-04-08 NOTE — Telephone Encounter (Signed)
I spoke with [facility], she request restrictions and protocol to be used for this post op pt.

## 2019-04-09 ENCOUNTER — Telehealth: Payer: Self-pay | Admitting: Podiatry

## 2019-04-09 NOTE — Telephone Encounter (Signed)
Pt Physical Therapist needs to know your limitations and if its weight bearing or non weight bearing. Please send over instructions.

## 2019-04-09 NOTE — Telephone Encounter (Signed)
Megan with [facility] called in regards to pt's status for weight bearing. Told Megan what Dr. Paulla Dolly responded to Celina, RN with. Jinny Blossom wrote it down since we did not have it written.

## 2019-04-10 NOTE — Telephone Encounter (Signed)
Can begin gradual wt. Bearing with boot and crutch assistance

## 2019-04-10 NOTE — Telephone Encounter (Signed)
I informed [facility] [place] of Dr. Mellody Drown 04/10/2019 8:35am.

## 2019-04-14 ENCOUNTER — Other Ambulatory Visit: Payer: Self-pay | Admitting: Family Medicine

## 2019-04-14 ENCOUNTER — Encounter: Payer: Self-pay | Admitting: Podiatry

## 2019-04-14 ENCOUNTER — Other Ambulatory Visit: Payer: Self-pay

## 2019-04-14 ENCOUNTER — Ambulatory Visit (INDEPENDENT_AMBULATORY_CARE_PROVIDER_SITE_OTHER): Payer: Commercial Managed Care - PPO | Admitting: Podiatry

## 2019-04-14 DIAGNOSIS — M7661 Achilles tendinitis, right leg: Secondary | ICD-10-CM | POA: Diagnosis not present

## 2019-04-14 DIAGNOSIS — E559 Vitamin D deficiency, unspecified: Secondary | ICD-10-CM

## 2019-04-16 NOTE — Progress Notes (Signed)
Subjective:   Patient ID: [name] Foster, male   DOB: 53 y.o.   MRN: [id number]   HPI Patient presents stating he is working with PT and states overall he is doing very well and it gets sore at times but overall he is very pleased   ROS      Objective:  Physical Exam  Neurovascular status intact negative Homans sign noted with patient's posterior incision site healing well with wound edges well coapted no breakdown of tissue stitches intact     Assessment:  Overall doing well post heel resection right with good motion and healing of the incision site     Plan:  H&P condition reviewed and recommended stitch removal which was accomplished today with no breakdown of incision site noted continue compression elevation immobilization and physical therapy and reappoint 3 weeks or earlier if needed

## 2019-04-28 ENCOUNTER — Telehealth: Payer: Self-pay | Admitting: Podiatry

## 2019-04-28 MED ORDER — IBUPROFEN 600 MG PO TABS: 600.0000 mg | ORAL_TABLET | Freq: Three times a day (TID) | ORAL | 0 refills | Status: DC | PRN

## 2019-04-28 NOTE — Addendum Note (Signed)
Addended by: Harriett Sine D on: 04/28/2019 01:44 PM   Modules accepted: Orders

## 2019-04-28 NOTE — Telephone Encounter (Signed)
Patient called requesting a refill on ibuprofen.   Please send prescription to [facility]

## 2019-05-05 ENCOUNTER — Encounter: Payer: Self-pay | Admitting: Podiatry

## 2019-05-05 ENCOUNTER — Ambulatory Visit (INDEPENDENT_AMBULATORY_CARE_PROVIDER_SITE_OTHER): Payer: Commercial Managed Care - PPO | Admitting: Podiatry

## 2019-05-05 ENCOUNTER — Ambulatory Visit (INDEPENDENT_AMBULATORY_CARE_PROVIDER_SITE_OTHER): Payer: Commercial Managed Care - PPO

## 2019-05-05 ENCOUNTER — Other Ambulatory Visit: Payer: Self-pay

## 2019-05-05 VITALS — Temp 98.4°F

## 2019-05-05 DIAGNOSIS — M7661 Achilles tendinitis, right leg: Secondary | ICD-10-CM

## 2019-05-05 NOTE — Progress Notes (Signed)
Subjective:   Patient ID: [name] Foster, male   DOB: 53 y.o.   MRN: [id number]   HPI Patient states doing a lot better with the surgery physical therapy is helping me and I do not like the surgical shoe   ROS      Objective:  Physical Exam  Neurovascular status intact with posterior right heel doing much better with incision site healing well wound edges well coapted good range of motion with no current equinus and no posterior pain     Assessment:  Patient continues to progress after having posterior heel surgery right     Plan:  Advised on anti-inflammatories and gradual increase in activity and dispensed ankle compression stocking and begin slow usage of shoes over the next 2 weeks with continued physical therapy  X-rays indicate satisfactory resection of bone with good alignment

## 2019-05-20 ENCOUNTER — Telehealth: Payer: Self-pay | Admitting: Podiatry

## 2019-05-20 MED ORDER — IBUPROFEN 600 MG PO TABS: 600.0000 mg | ORAL_TABLET | Freq: Three times a day (TID) | ORAL | 1 refills | Status: DC | PRN

## 2019-05-20 NOTE — Telephone Encounter (Signed)
Requesting a refill of the tylenol/ibuprofen. Pt is out of the medication and is having pain from therapy.

## 2019-05-20 NOTE — Telephone Encounter (Signed)
I spoke with pt's wife [name] and she states pt is taking therapy and would like ibuprofen plus an additional refill. Dr. Josephina Shih the refill of the ibuprofen +1 additional.

## 2019-05-20 NOTE — Addendum Note (Signed)
Addended by: Harriett Sine D on: 05/20/2019 03:13 PM   Modules accepted: Orders

## 2019-05-26 ENCOUNTER — Other Ambulatory Visit: Payer: Self-pay | Admitting: Family Medicine

## 2019-05-26 DIAGNOSIS — I10 Essential (primary) hypertension: Secondary | ICD-10-CM

## 2019-05-26 DIAGNOSIS — E559 Vitamin D deficiency, unspecified: Secondary | ICD-10-CM

## 2019-05-29 DIAGNOSIS — M79676 Pain in unspecified toe(s): Secondary | ICD-10-CM

## 2019-06-02 ENCOUNTER — Other Ambulatory Visit: Payer: Self-pay

## 2019-06-02 ENCOUNTER — Ambulatory Visit (INDEPENDENT_AMBULATORY_CARE_PROVIDER_SITE_OTHER): Payer: Commercial Managed Care - PPO

## 2019-06-02 ENCOUNTER — Ambulatory Visit (INDEPENDENT_AMBULATORY_CARE_PROVIDER_SITE_OTHER): Payer: Commercial Managed Care - PPO | Admitting: Podiatry

## 2019-06-02 ENCOUNTER — Encounter: Payer: Self-pay | Admitting: Podiatry

## 2019-06-02 DIAGNOSIS — M722 Plantar fascial fibromatosis: Secondary | ICD-10-CM | POA: Diagnosis not present

## 2019-06-02 DIAGNOSIS — M7661 Achilles tendinitis, right leg: Secondary | ICD-10-CM | POA: Diagnosis not present

## 2019-06-02 NOTE — Progress Notes (Signed)
Subjective:   Patient ID: [name] Foster, male   DOB: 53 y.o.   MRN: [id number]   HPI Patient states the back of the heel seems to be doing pretty well but I am getting quite a bit of pain in the bottom of the heel when I stepped down   ROS      Objective:  Physical Exam  Neurovascular status intact with patient's posterior incision site healing well with good alignment noted and a moderate amount of discomfort plantar aspect of the heel     Assessment:  Achilles tendon doing well after surgery along with gastroc repair with discomfort in the plantar heel upon palpation     Plan:  H&P x-ray reviewed and today I went ahead did sterile prep and injected the plantar fascial 3 mg Kenalog 5 mg Xylocaine and went ahead and I advised on gradual increase in activity for the posterior heel with return to normal activity as he is able to tolerate  X-ray indicated satisfactory resection and removal of bone with good alignment noted

## 2019-06-06 ENCOUNTER — Telehealth: Payer: Self-pay | Admitting: Podiatry

## 2019-06-06 NOTE — Telephone Encounter (Signed)
Pt is still having pain after sx, he wanted to know was he suppose to go back to work. He didn't have any paper work stating when he should go back to work.

## 2019-06-09 NOTE — Telephone Encounter (Signed)
I informed pt of Dr. Mellody Drown recommendation and pt states this job does not have light duty, and he is still in pain. I transferred pt to scheduler for an appt with Dr. Paulla Dolly for evaluation of his discomfort and other treatment options.

## 2019-06-09 NOTE — Telephone Encounter (Signed)
Pt called states fax note to HR Attn: Gabriel Cirri [phone]. Faxed note to pt's HR.

## 2019-06-09 NOTE — Telephone Encounter (Signed)
Pt states his employer does not have light duty so he will need to be out of work from 06/02/2019 to his next appt 06/16/2019. Pt states he will call back with his HR fax.

## 2019-06-09 NOTE — Telephone Encounter (Signed)
Hoping he can go back to work light duty. Depends on how it feels and what they will let him do

## 2019-06-10 ENCOUNTER — Telehealth: Payer: Self-pay | Admitting: Podiatry

## 2019-06-10 NOTE — Telephone Encounter (Signed)
Attorney Sherre Poot and I sent a medical records release form a couple of months ago. We received those requested medical records promptly. If I could get the office visit notes after the release faxed as we have an upcoming court date for his injury. Please call me with any questions.

## 2019-06-16 ENCOUNTER — Encounter: Payer: Self-pay | Admitting: Podiatry

## 2019-06-16 ENCOUNTER — Ambulatory Visit (INDEPENDENT_AMBULATORY_CARE_PROVIDER_SITE_OTHER): Payer: Commercial Managed Care - PPO | Admitting: Podiatry

## 2019-06-16 ENCOUNTER — Other Ambulatory Visit: Payer: Self-pay

## 2019-06-16 DIAGNOSIS — M7661 Achilles tendinitis, right leg: Secondary | ICD-10-CM | POA: Diagnosis not present

## 2019-06-16 DIAGNOSIS — M7662 Achilles tendinitis, left leg: Secondary | ICD-10-CM

## 2019-06-16 DIAGNOSIS — M722 Plantar fascial fibromatosis: Secondary | ICD-10-CM

## 2019-06-17 ENCOUNTER — Telehealth: Payer: Self-pay | Admitting: Podiatry

## 2019-06-17 NOTE — Telephone Encounter (Signed)
Patient was suppose to receive anti inflammatory medication in the place of the pain medication. It was not sent to the pharmacy.

## 2019-06-18 ENCOUNTER — Telehealth: Payer: Self-pay | Admitting: *Deleted

## 2019-06-18 NOTE — Telephone Encounter (Signed)
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

## 2019-06-18 NOTE — Telephone Encounter (Signed)
Pt request note to return back to work.

## 2019-06-18 NOTE — Telephone Encounter (Signed)
Diclofenac bid 75 mg. #60 2 refills

## 2019-06-19 ENCOUNTER — Telehealth: Payer: Self-pay | Admitting: Podiatry

## 2019-06-19 MED ORDER — DICLOFENAC SODIUM 75 MG PO TBEC: 75.0000 mg | DELAYED_RELEASE_TABLET | Freq: Two times a day (BID) | ORAL | 2 refills | Status: DC

## 2019-06-19 NOTE — Addendum Note (Signed)
Addended by: Harriett Sine D on: 06/19/2019 08:31 AM   Modules accepted: Orders

## 2019-06-19 NOTE — Telephone Encounter (Signed)
Patient called back and stated that he just needed a work note for his HR. They needs some type of documentation stating when he could possibly return. [name] Foster stated that we would like the note to say the beginning of the year if possible to give his foot time to heal. Patient needs the anti-inflammatory medication sent to his pharmacy as well. Please call patient

## 2019-06-19 NOTE — Telephone Encounter (Signed)
He did not seem to indicate that he wanted that. I would love to release him without restrictions but not sure if he will go for it

## 2019-06-19 NOTE — Telephone Encounter (Signed)
Orders to [facility].

## 2019-06-20 ENCOUNTER — Encounter: Payer: Self-pay | Admitting: Podiatry

## 2019-06-20 NOTE — Telephone Encounter (Signed)
Pt called back stating he will need a work note faxed to his HR department stating he is to remain out of work until his follow up appt on 10/26 and will re-evaluate his work situation at that time.   Is it okay to go ahead and write this note?

## 2019-06-20 NOTE — Telephone Encounter (Signed)
Letter was faxed to [phone]

## 2019-06-20 NOTE — Telephone Encounter (Signed)
He has appointment coming up end of month. That should be fine

## 2019-06-22 NOTE — Progress Notes (Signed)
Subjective:   Patient ID: [name] Foster, male   DOB: 53 y.o.   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  There is discomfort more on the plantar posterior aspect of the right heel which I am hoping is a good peds toward inflammatory condition     Assessment:  Still having posterior discomfort but improving slowly and unfortunately this is the kind of surgery that just takes a large time in order to get better completely with moderate plantar pain     Plan:  Reviewed conditions and at this point I did a very careful plantar posterior injection 3 mg Dexasone Kenalog 5 mg Xylocaine and advised on reduced activity with hopeful return to work over the next 4 weeks

## 2019-06-30 ENCOUNTER — Telehealth: Payer: Self-pay

## 2019-06-30 ENCOUNTER — Other Ambulatory Visit: Payer: Self-pay | Admitting: Family Medicine

## 2019-06-30 DIAGNOSIS — R3911 Hesitancy of micturition: Secondary | ICD-10-CM

## 2019-06-30 DIAGNOSIS — N529 Male erectile dysfunction, unspecified: Secondary | ICD-10-CM

## 2019-06-30 NOTE — Telephone Encounter (Signed)
Patient notified he voiced his understanding

## 2019-06-30 NOTE — Telephone Encounter (Signed)
Copied from [place] [phone]. Topic: Referral - Request for Referral >> Jun 30, 2019  9:47 AM [name] Foster L wrote: Has patient seen PCP for this complaint? no *If NO, is insurance requiring patient see PCP for this issue before PCP can refer them? Referral for which specialty: urologist Preferred provider/office: no preference - whoever he saw before, he can't remember Reason for referral: prostate exam  Pt can be reached at [phone]   Dr. Charlett Blake- Patient wants a prostate exam he had a PSA done in 2018. Please advise

## 2019-06-30 NOTE — Telephone Encounter (Signed)
Have placed referral 

## 2019-07-07 ENCOUNTER — Other Ambulatory Visit: Payer: Self-pay

## 2019-07-07 ENCOUNTER — Ambulatory Visit (INDEPENDENT_AMBULATORY_CARE_PROVIDER_SITE_OTHER): Payer: Commercial Managed Care - PPO | Admitting: Podiatry

## 2019-07-07 ENCOUNTER — Encounter: Payer: Self-pay | Admitting: Podiatry

## 2019-07-07 DIAGNOSIS — M722 Plantar fascial fibromatosis: Secondary | ICD-10-CM

## 2019-07-07 DIAGNOSIS — M7661 Achilles tendinitis, right leg: Secondary | ICD-10-CM | POA: Diagnosis not present

## 2019-07-09 NOTE — Progress Notes (Signed)
Subjective:   Patient ID: [name] Foster, male   DOB: 53 y.o.   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

## 2019-07-11 ENCOUNTER — Other Ambulatory Visit: Payer: Self-pay

## 2019-07-11 ENCOUNTER — Encounter: Payer: Self-pay | Admitting: Family Medicine

## 2019-07-11 ENCOUNTER — Ambulatory Visit (INDEPENDENT_AMBULATORY_CARE_PROVIDER_SITE_OTHER): Payer: Commercial Managed Care - PPO | Admitting: Family Medicine

## 2019-07-11 DIAGNOSIS — J069 Acute upper respiratory infection, unspecified: Secondary | ICD-10-CM

## 2019-07-11 DIAGNOSIS — R52 Pain, unspecified: Secondary | ICD-10-CM | POA: Diagnosis not present

## 2019-07-11 DIAGNOSIS — Z20822 Contact with and (suspected) exposure to covid-19: Secondary | ICD-10-CM

## 2019-07-11 MED ORDER — PREDNISONE 20 MG PO TABS: 40.0000 mg | ORAL_TABLET | Freq: Every day | ORAL | 0 refills | Status: AC

## 2019-07-11 MED ORDER — AMOXICILLIN 500 MG PO CAPS: 500.0000 mg | ORAL_CAPSULE | Freq: Two times a day (BID) | ORAL | 0 refills | Status: AC

## 2019-07-11 NOTE — Progress Notes (Signed)
Chief Complaint  Patient presents with  . Generalized Body Aches    congestion    [name] Foster here for URI complaints. Due to COVID-19 pandemic, we are interacting via web portal for an electronic face-to-face visit. I verified patient's ID using 2 identifiers. Patient agreed to proceed with visit via this method. Patient is at home, I am at office. Patient and I are present for visit.   Duration: 1 week  Associated symptoms: sinus headache, sinus congestion, shortness of breath, chest pain, myalgia and cough Denies: sinus pain, rhinorrhea, itchy watery eyes, ear pain, ear drainage, sore throat, wheezing, gi s/s's, and fevers Treatment to date: Dayquil, otc cough med, nasal spray Sick contacts: Yes- wife-took Tessalon Perles and Augmentin  ROS:  Const: Denies fevers HEENT: As noted in HPI Lungs: No SOB  Past Medical History:  Diagnosis Date  . Bone spur    left foot  . Chest pain   . Diabetes mellitus type 2 in obese ([place]) 07/13/2013  . Diverticulosis   . Erectile dysfunction 01/15/2013  . Esophageal reflux 01/15/2013  . Fatty liver 07/04/2015  . GERD (gastroesophageal reflux disease)   . Hiatal hernia   . Hiatal hernia with gastroesophageal reflux 03/20/2014  . Hyperglycemia 07/13/2013  . Hyperplastic colon polyp   . Hypertension   . Internal hemorrhoids   . Mixed hyperlipidemia   . Murmur   . OSA (obstructive sleep apnea)    s/p UPPP  . Plantar fasciitis   . Prediabetes   . Reflux esophagitis   . Stomach ulcer    from PCP  . Urinary hesitancy 09/30/2015   Exam No conversational dyspnea Age appropriate judgment and insight Nml affect and mood  Upper respiratory tract infection, unspecified type - Plan: amoxicillin (AMOXIL) 500 MG capsule, predniSONE (DELTASONE) 20 MG tablet  Generalized body aches - Plan: Novel Coronavirus, NAA ([facility])  Probably doesn't need amoxicillin, will do pred burst. If starting to worsen, will use abx.  Continue to push fluids, practice  good hand hygiene, cover mouth when coughing. F/u prn. If starting to experience fevers, shaking, or shortness of breath, seek immediate care. Pt voiced understanding and agreement to the plan.  [place], DO 07/11/19 10:36 AM

## 2019-07-12 LAB — NOVEL CORONAVIRUS, NAA: SARS-CoV-2, NAA: NOT DETECTED

## 2019-07-14 ENCOUNTER — Other Ambulatory Visit: Payer: Self-pay

## 2019-07-14 ENCOUNTER — Ambulatory Visit (INDEPENDENT_AMBULATORY_CARE_PROVIDER_SITE_OTHER): Payer: Commercial Managed Care - PPO | Admitting: Family Medicine

## 2019-07-14 ENCOUNTER — Encounter: Payer: Self-pay | Admitting: Family Medicine

## 2019-07-14 DIAGNOSIS — M25512 Pain in left shoulder: Secondary | ICD-10-CM | POA: Diagnosis not present

## 2019-07-14 DIAGNOSIS — G8929 Other chronic pain: Secondary | ICD-10-CM | POA: Diagnosis not present

## 2019-07-14 DIAGNOSIS — M25511 Pain in right shoulder: Secondary | ICD-10-CM | POA: Diagnosis not present

## 2019-07-14 DIAGNOSIS — M545 Low back pain: Secondary | ICD-10-CM | POA: Diagnosis not present
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
## 2019-07-18 ENCOUNTER — Other Ambulatory Visit: Payer: Self-pay

## 2019-07-18 ENCOUNTER — Emergency Department (HOSPITAL_BASED_OUTPATIENT_CLINIC_OR_DEPARTMENT_OTHER): Payer: Commercial Managed Care - PPO

## 2019-07-18 ENCOUNTER — Encounter (HOSPITAL_BASED_OUTPATIENT_CLINIC_OR_DEPARTMENT_OTHER): Payer: Self-pay | Admitting: Emergency Medicine

## 2019-07-18 ENCOUNTER — Emergency Department (HOSPITAL_BASED_OUTPATIENT_CLINIC_OR_DEPARTMENT_OTHER)
Admission: EM | Admit: 2019-07-18 | Discharge: 2019-07-18 | Disposition: A | Discharge status: Home / Self Care | Payer: Commercial Managed Care - PPO | Attending: Emergency Medicine | Admitting: Emergency Medicine

## 2019-07-18 ENCOUNTER — Ambulatory Visit: Payer: Self-pay | Admitting: *Deleted

## 2019-07-18 DIAGNOSIS — Z79899 Other long term (current) drug therapy: Secondary | ICD-10-CM | POA: Diagnosis not present

## 2019-07-18 DIAGNOSIS — J069 Acute upper respiratory infection, unspecified: Secondary | ICD-10-CM | POA: Diagnosis not present

## 2019-07-18 DIAGNOSIS — I10 Essential (primary) hypertension: Secondary | ICD-10-CM | POA: Insufficient documentation

## 2019-07-18 DIAGNOSIS — U071 COVID-19: Secondary | ICD-10-CM | POA: Insufficient documentation

## 2019-07-18 DIAGNOSIS — R079 Chest pain, unspecified: Secondary | ICD-10-CM | POA: Diagnosis present

## 2019-07-18 DIAGNOSIS — Z7982 Long term (current) use of aspirin: Secondary | ICD-10-CM | POA: Diagnosis not present

## 2019-07-18 DIAGNOSIS — E119 Type 2 diabetes mellitus without complications: Secondary | ICD-10-CM | POA: Diagnosis not present

## 2019-07-18 DIAGNOSIS — R0789 Other chest pain: Secondary | ICD-10-CM

## 2019-07-18 DIAGNOSIS — R05 Cough: Secondary | ICD-10-CM | POA: Insufficient documentation

## 2019-07-18 DIAGNOSIS — Z7984 Long term (current) use of oral hypoglycemic drugs: Secondary | ICD-10-CM | POA: Insufficient documentation

## 2019-07-18 LAB — CBC WITH DIFFERENTIAL/PLATELET
Abs Immature Granulocytes: 0.06 10*3/uL (ref 0.00–0.07)
Basophils Absolute: 0 10*3/uL (ref 0.0–0.1)
Basophils Relative: 1 %
Eosinophils Absolute: 0 10*3/uL (ref 0.0–0.5)
Eosinophils Relative: 1 %
HCT: 50.4 % (ref 39.0–52.0)
Hemoglobin: 16.4 g/dL (ref 13.0–17.0)
Immature Granulocytes: 1 %
Lymphocytes Relative: 18 %
Lymphs Abs: 1.2 10*3/uL (ref 0.7–4.0)
MCH: 28.6 pg (ref 26.0–34.0)
MCHC: 32.5 g/dL (ref 30.0–36.0)
MCV: 87.8 fL (ref 80.0–100.0)
Monocytes Absolute: 0.8 10*3/uL (ref 0.1–1.0)
Monocytes Relative: 13 %
Neutro Abs: 4.4 10*3/uL (ref 1.7–7.7)
Neutrophils Relative %: 66 %
Platelets: 191 10*3/uL (ref 150–400)
RBC: 5.74 MIL/uL (ref 4.22–5.81)
RDW: 14.3 % (ref 11.5–15.5)
WBC: 6.5 10*3/uL (ref 4.0–10.5)
nRBC: 0 % (ref 0.0–0.2)

## 2019-07-18 LAB — COMPREHENSIVE METABOLIC PANEL
ALT: 42 U/L (ref 0–44)
AST: 24 U/L (ref 15–41)
Albumin: 4.3 g/dL (ref 3.5–5.0)
Alkaline Phosphatase: 72 U/L (ref 38–126)
Anion gap: 8 (ref 5–15)
BUN: 16 mg/dL (ref 6–20)
CO2: 26 mmol/L (ref 22–32)
Calcium: 9.1 mg/dL (ref 8.9–10.3)
Chloride: 102 mmol/L (ref 98–111)
Creatinine, Ser: 0.95 mg/dL (ref 0.61–1.24)
GFR calc Af Amer: >60 mL/min (ref >60)
GFR calc non Af Amer: >60 mL/min (ref >60)
Glucose, Bld: 89 mg/dL (ref 70–99)
Potassium: 3.8 mmol/L (ref 3.5–5.1)
Sodium: 136 mmol/L (ref 135–145)
Total Bilirubin: 0.6 mg/dL (ref 0.3–1.2)
Total Protein: 7.6 g/dL (ref 6.5–8.1)

## 2019-07-18 LAB — TROPONIN I (HIGH SENSITIVITY): Troponin I (High Sensitivity): 8 ng/L (ref <18)

## 2019-07-18 LAB — D-DIMER, QUANTITATIVE: D-Dimer, Quant: 0.44 ug/mL-FEU (ref 0.00–0.50)

## 2019-07-18 MED ORDER — BENZONATATE 100 MG PO CAPS: 200.0000 mg | ORAL_CAPSULE | Freq: Two times a day (BID) | ORAL | 0 refills | Status: DC | PRN

## 2019-07-18 MED ORDER — KETOROLAC TROMETHAMINE 30 MG/ML IJ SOLN
30.0000 mg | Freq: Once | INTRAMUSCULAR | Status: AC
  Administered 2019-07-18: 30 mg via INTRAVENOUS
  Filled 2019-07-18: qty 1

## 2019-07-18 MED FILL — BENZONATATE 100 MG CAPS: 100 | 4 days supply | Qty: 16 | Fill #0

## 2019-07-18 NOTE — ED Triage Notes (Signed)
Pt states he is having chest pain since last night.  Pt has been coughing for over a week with sob.  Pt tested negative for covid at that time but was told to return to ed for another test.

## 2019-07-18 NOTE — ED Notes (Signed)
Pt on monitor 

## 2019-07-18 NOTE — Telephone Encounter (Signed)
I returned call to pt.   He did a virtual visit with Dr. Nani Ravens on 07/11/2019 for URI symptoms. He is c/o of same symptoms of chest burning and "It hurts real bad when I cough and it goes around into my back",  Shortness of breath, and a "deep cough especially at night",  Also body aches.   Does not have fever.   It was 98 this morning.  He was tested for COVID-19 and had a negative test result.  He is using Dayquil, Robitussin cough medicine and cough drops.    He also completed the antibiotics and prednisone prescribed for him by Dr. Nani Ravens.   He was getting better but now is getting worse.  Protocol is to be seen within 4 hours.    I called into Dr. Irene Limbo office and spoke with Reggy Eye.   I let her know what was going on and about his virtual visit last week.    Due to his symptoms and a possible false negative COVID-19 test result it was recommended he go to the urgent care.  I let the pt know this and he was agreeable.   "My sister is on the way now to take me to the one at [facility] on [address] now".   I went over the care advice and when to call 911 if he were to get worse before his sister arrived to take him to the [facility] ED.     I sent these notes to [facility] office for Dr. Horald Pollen information.    Reason for Disposition . [1] Continuous (nonstop) coughing AND [2] keeps from working or sleeping  Answer Assessment - Initial Assessment Questions 1. RESPIRATORY STATUS: "Describe your breathing?" (e.g., wheezing, shortness of breath, unable to speak, severe coughing)      For 1 day and 1/2 I'm having trouble breathing, chest pain radiating to the back and a bad cough.    No fever 98. This morning.    I'm coughing up bright yellow mucus.     2. ONSET: "When did this breathing problem begin?"      Going on for 1 day 1/2.     3. PATTERN "Does the difficult breathing come and go, or has it been constant since it started?"      Constant 4.  SEVERITY: "How bad is your breathing?" (e.g., mild, moderate, severe)    - MILD: No SOB at rest, mild SOB with walking, speaks normally in sentences, can lay down, no retractions, pulse < 100.    - MODERATE: SOB at rest, SOB with minimal exertion and prefers to sit, cannot lie down flat, speaks in phrases, mild retractions, audible wheezing, pulse 100-120.    - SEVERE: Very SOB at rest, speaks in single words, struggling to breathe, sitting hunched forward, retractions, pulse > 120      Moderate   At night it's harder to breath when I'm laying down.   It's a little easier to breath when I'm sitting up.    5. RECURRENT SYMPTOM: "Have you had difficulty breathing before?" If so, ask: "When was the last time?" and "What happened that time?"      I've been having chest for a while.   It burns in the middle of my chest.   I have a heart murmur but it's fine per my heart doctor. 6. CARDIAC HISTORY: "Do you have any history of heart disease?" (e.g., heart attack, angina, bypass surgery, angioplasty)  Only a murmur 7. LUNG HISTORY: "Do you have any history of lung disease?"  (e.g., pulmonary embolus, asthma, emphysema)     I have acid reflux and high BP.  No history of blood clots. 8. CAUSE: "What do you think is causing the breathing problem?"      The doctor gave me prednisone, an antibiotic.   It was helping until yesterday.   I'm using Robitussin, Dayquill and cough drops.    I've also tried the nose spray  sluticasone that I was prescribed a while ago.    I did a virtual visit with Dr. Nani Ravens last week (07/11/2019). 9. OTHER SYMPTOMS: "Do you have any other symptoms? (e.g., dizziness, runny nose, cough, chest pain, fever)     Cough is worse at night and it's deep.   It hurts really bad to cough and it radiates to the back.   I was tested for COVID-19 and it was negative. 10. PREGNANCY: "Is there any chance you are pregnant?" "When was your last menstrual period?"       N/A 11. TRAVEL: "Have you  traveled out of the country in the last month?" (e.g., travel history, exposures)       They did the COVID test but it wasn't the one where they put it all the way back into your nose.    [facility] done at [facility]  Protocols used: BREATHING DIFFICULTY-A-AH

## 2019-07-18 NOTE — ED Notes (Signed)
C/o cough, facial pain congestion  Chest pain x 3 days  Pain increased w cough and deep breath

## 2019-07-18 NOTE — ED Notes (Signed)
IV attempt x2 left AC unsuccessful.  [place] for labs sent.

## 2019-07-18 NOTE — ED Provider Notes (Signed)
[facility] DEPARTMENT Provider Note   CSN: [id number] Arrival date & time: 07/18/19  0906     History   Chief Complaint Chief Complaint  Patient presents with  . Chest Pain  . Cough    HPI [name] Foster is a 53 y.o. male.     53 year old male with past medical history below who presents with cough, shortness of breath, and chest pain.  Approximately a week ago, the patient began having cough associated with nasal congestion, facial pain.  He had a COVID-19 test that was negative.  He has continued to have coughing and over the past few days has had some mild shortness of breath.  He began having central, nonradiating chest pain 2 days ago that has been constant since it began.  The pain is worse with coughing and deep breathing.  He denies any leg swelling or pain.  No recent travel or sick contacts.  No fevers or vomiting.  The history is provided by the patient.  Chest Pain Associated symptoms: cough   Cough Associated symptoms: chest pain     Past Medical History:  Diagnosis Date  . Bone spur    left foot  . Chest pain   . Diabetes mellitus type 2 in obese ([place]) 07/13/2013  . Diverticulosis   . Erectile dysfunction 01/15/2013  . Esophageal reflux 01/15/2013  . Fatty liver 07/04/2015  . GERD (gastroesophageal reflux disease)   . Hiatal hernia   . Hiatal hernia with gastroesophageal reflux 03/20/2014  . Hyperglycemia 07/13/2013  . Hyperplastic colon polyp   . Hypertension   . Internal hemorrhoids   . Mixed hyperlipidemia   . Murmur   . OSA (obstructive sleep apnea)    s/p UPPP  . Plantar fasciitis   . Prediabetes   . Reflux esophagitis   . Stomach ulcer    from PCP  . Urinary hesitancy 09/30/2015    Patient Active Problem List   Diagnosis Date Noted  . Bilateral inguinal hernia without obstruction or gangrene 04/11/2017  . Umbilical hernia without obstruction and without gangrene 04/11/2017  . Vitamin D deficiency 01/24/2017  . Type 2  diabetes mellitus without complication, without long-term current use of insulin ([place]) 01/24/2017  . Depression 01/24/2017  . Shortness of breath on exertion 11/27/2016  . Internal hemorrhoids   . Atypical chest pain   . Gastroesophageal reflux disease without esophagitis   . Dyspnea 01/18/2016  . Preventative health care 10/10/2015  . Sleep apnea 10/10/2015  . Urinary hesitancy 09/30/2015  . Fatty liver 07/04/2015  . Edema 05/07/2014  . Scleroderma of esophagus ([place]) 03/20/2014  . Hiatal hernia with gastroesophageal reflux 03/20/2014  . Anxiety state, unspecified 07/13/2013  . Erectile dysfunction 01/15/2013  . Back pain 03/09/2012  . Mesenteric panniculitis ([place]) 02/06/2012  . Heel pain, bilateral 04/25/2010  . Obesity 01/05/2010  . Hyperlipidemia 10/04/2009  . Essential hypertension 10/04/2009    Past Surgical History:  Procedure Laterality Date  . [place] STUDY N/A 02/28/2016   Procedure: [place] STUDY;  Surgeon: Jerene Bears, MD;  Location: [facility];  Service: Gastroenterology;  Laterality: N/A;  . ESOPHAGEAL MANOMETRY N/A 09/01/2013   Procedure: ESOPHAGEAL MANOMETRY (EM);  Surgeon: Sable Feil, MD;  Location: [facility];  Service: Endoscopy;  Laterality: N/A;  . ESOPHAGEAL MANOMETRY N/A 02/28/2016   Procedure: ESOPHAGEAL MANOMETRY (EM);  Surgeon: Jerene Bears, MD;  Location: [facility];  Service: Gastroenterology;  Laterality: N/A;  . INGUINAL HERNIA REPAIR    .  TONSILLECTOMY    . UMBILICAL HERNIA REPAIR    . UVULECTOMY          Home Medications    Prior to Admission medications   Medication Sig Start Date End Date Taking? Authorizing Provider  acetaminophen (TYLENOL) 500 MG tablet Take 1 tablet (500 mg total) by mouth every 6 (six) hours as needed. 05/24/16   [name], MD  albuterol (PROVENTIL HFA;VENTOLIN HFA) 108 (90 Base) MCG/ACT inhaler Inhale 2 puffs into the lungs every 6 (six) hours as needed for wheezing or shortness of breath.  12/06/17   Debbrah Alar, NP  amitriptyline (ELAVIL) 25 MG tablet Take 1 tablet (25 mg total) by mouth at bedtime. Patient not taking: Reported on 07/14/2019 02/13/19   Jerene Bears, MD  amLODipine (NORVASC) 5 MG tablet Take 1 tablet by mouth once daily 01/15/19   Copland, Gay Filler, MD  amoxicillin (AMOXIL) 500 MG capsule Take 1 capsule (500 mg total) by mouth 2 (two) times daily for 7 days. 07/13/19 07/20/19  Shelda Pal, DO  aspirin EC 81 MG tablet Take 81 mg by mouth daily.    [provider]  baclofen (LIORESAL) 10 MG tablet Take 0.5-1 tablets (5-10 mg total) by mouth at bedtime as needed for muscle spasms. Patient not taking: Reported on 07/14/2019 08/29/18   Hilts, Legrand Como, MD  BAYER MICROLET LANCETS lancets Test blood sugars twice daily 10/12/17   Shelda Pal, DO  Blood Glucose Monitoring Suppl (CONTOUR NEXT MONITOR) w/Device KIT 1 kit by Does not apply route 2 (two) times daily. 03/15/17   Dennard Nip D, MD  celecoxib (CELEBREX) 100 MG capsule Take 1 capsule (100 mg total) by mouth 2 (two) times daily. For 2 weeks Patient not taking: Reported on 07/14/2019 11/27/18   Richardo Priest, MD  diclofenac (VOLTAREN) 75 MG EC tablet Take 1 tablet (75 mg total) by mouth 2 (two) times daily. 06/19/19   Wallene Huh, DPM  etodolac (LODINE) 400 MG tablet Take 1 tablet (400 mg total) by mouth 2 (two) times daily as needed. Patient not taking: Reported on 07/14/2019 08/29/18   Hilts, Legrand Como, MD  famotidine (PEPCID) 40 MG tablet Take 1 tablet (40 mg total) by mouth daily. 10/14/18   [name], MD  fluticasone (FLONASE) 50 MCG/ACT nasal spray Place 2 sprays into both nostrils daily. 06/11/18   [name], MD  glucose blood (CONTOUR NEXT TEST) test strip Test blood sugars twice daily 10/12/17   Shelda Pal, DO  hyoscyamine (LEVSIN SL) 0.125 MG SL tablet Place 1 tablet (0.125 mg total) under the tongue every 4 (four) hours as needed. Patient not taking:  Reported on 07/14/2019 06/11/18   [name], MD  ibuprofen (ADVIL) 600 MG tablet Take 1 tablet (600 mg total) by mouth every 8 (eight) hours as needed. Patient not taking: Reported on 07/14/2019 05/20/19   Wallene Huh, DPM  metFORMIN (GLUCOPHAGE) 500 MG tablet Take 1 tablet (500 mg total) by mouth daily with breakfast. Patient not taking: Reported on 07/14/2019 02/03/19   [name], MD  Multiple Vitamin (MULTIVITAMIN) tablet Take 1 tablet by mouth daily.    [provider]  omeprazole (PRILOSEC) 40 MG capsule Take 1 capsule (40 mg total) by mouth every morning. 02/13/19   Pyrtle, Lajuan Lines, MD  ondansetron (ZOFRAN) 4 MG tablet Take 4 mg by mouth every 8 (eight) hours as needed for nausea or vomiting.    [provider]  pantoprazole (PROTONIX) 40 MG tablet Take 1 tablet (40 mg total) by mouth 2 (two) times daily. 02/03/19   [name], MD  rosuvastatin (CRESTOR) 10 MG tablet Take 1 tablet by mouth once daily 02/21/19   Richardo Priest, MD  tadalafil (CIALIS) 5 MG tablet Take 1 tablet (5 mg total) by mouth daily as needed for erectile dysfunction. 12/06/17   Debbrah Alar, NP  triamterene-hydrochlorothiazide [facility]) 37.5-25 MG tablet Take 1 tablet by mouth once daily 05/26/19   [name], MD  Vitamin D, Ergocalciferol, (DRISDOL) 1.25 MG (50000 UT) CAPS capsule TAKE 1 CAPSULE BY MOUTH ONCE A WEEK FOR  12  WEEKS 05/27/19   [name], MD    Family History Family History  Problem Relation Age of Onset  . Dementia Mother   . Diabetes Mother   . Hypertension Mother   . Heart failure Mother   . Stroke Mother   . Obesity Mother   . Hypertension Other        siblings  . Diabetes Sister   . Cancer Brother        lung cancer smoker  . Heart attack Maternal Grandmother   . Diabetes Sister   . Pancreatic disease Sister   . Obesity Father   . Colon cancer Neg Hx     Social History Social History   Tobacco Use  . Smoking status: Never Smoker  .  Smokeless tobacco: Never Used  Substance Use Topics  . Alcohol use: No  . Drug use: No     Allergies   Hydrocodone and Tramadol   Review of Systems Review of Systems  Respiratory: Positive for cough.   Cardiovascular: Positive for chest pain.   All other systems reviewed and are negative except that which was mentioned in HPI   Physical Exam Updated Vital Signs BP (!) 153/105   Pulse 86   Temp 99.1 F (37.3 C) (Oral)   Resp (!) 21   Ht 6' (1.829 m)   Wt 117.9 kg   SpO2 95%   BMI 35.26 kg/m   Physical Exam Vitals signs and nursing note reviewed.  Constitutional:      General: He is not in acute distress.    Appearance: He is well-developed.  HENT:     Head: Normocephalic and atraumatic.  Eyes:     Conjunctiva/sclera: Conjunctivae normal.  Neck:     Musculoskeletal: Neck supple.  Cardiovascular:     Rate and Rhythm: Normal rate and regular rhythm.     Heart sounds: Normal heart sounds. No murmur.  Pulmonary:     Effort: Pulmonary effort is normal.     Breath sounds: Normal breath sounds.  Abdominal:     General: Bowel sounds are normal. There is no distension.     Palpations: Abdomen is soft.     Tenderness: There is no abdominal tenderness.  Musculoskeletal:     Right lower leg: No edema.     Left lower leg: No edema.  Skin:    General: Skin is warm and dry.  Neurological:     Mental Status: He is alert and oriented to person, place, and time.     Comments: Fluent speech  Psychiatric:        Judgment: Judgment normal.      ED Treatments / Results  Labs (all labs ordered are listed, but only abnormal results are displayed) Labs Reviewed  NOVEL CORONAVIRUS, NAA (HOSP ORDER, SEND-OUT TO REF LAB; TAT 18-24 HRS)  CBC WITH DIFFERENTIAL/PLATELET  COMPREHENSIVE METABOLIC PANEL  D-DIMER, QUANTITATIVE (NOT AT [facility])  TROPONIN I (HIGH SENSITIVITY)  TROPONIN I (HIGH SENSITIVITY)    EKG EKG Interpretation  Date/Time:  Friday July 18 2019 09:28:16  EST Ventricular Rate:  82 PR Interval:  188 QRS Duration: 82 QT Interval:  350 QTC Calculation: 408 R Axis:   50 Text Interpretation: Normal sinus rhythm Normal ECG No significant change since last tracing Confirmed by Theotis Burrow [phone]) on 07/18/2019 9:38:47 AM   Radiology Dg Chest Port 1 View  Result Date: 07/18/2019 CLINICAL DATA:  Chest pain and shortness of breath. EXAM: PORTABLE CHEST 1 VIEW COMPARISON:  November 26, 2018 FINDINGS: The lungs are clear. Heart is mildly enlarged with pulmonary vascularity normal. No adenopathy. No bone lesions. IMPRESSION: Mild cardiomegaly.  No edema or consolidation. Electronically Signed   By: Lowella Grip III M.D.   On: 07/18/2019 11:07    Procedures Procedures (including critical care time)  Medications Ordered in ED Medications  ketorolac (TORADOL) 30 MG/ML injection 30 mg (has no administration in time range)     Initial Impression / Assessment and Plan / ED Course  I have reviewed the triage vital signs and the nursing notes.  Pertinent labs & imaging results that were available during my care of the patient were reviewed by me and considered in my medical decision making (see chart for details).        Well-appearing on exam, hypertensive but otherwise reassuring vital signs.  No wheezing and normal work of breathing.  Chest x-ray is clear.  Screening lab work is unremarkable.  Troponin and D-dimer normal.  I feel that single troponin is sufficient given that he has had 2 days of constant pain and his symptoms are atypical for ACS, more likely to be related to his illness.  EKG is unchanged from previous.  I suspect that his chest pain may be related to costochondritis from coughing given his constellation of symptoms.  I did recommend repeat COVID-19 testing given the current pandemic and the possibility of a false negative initial test.  I discussed supportive measures for his symptoms and recommended follow-up with PCP.   Extensively reviewed return precautions and he voiced understanding.  [name] Foster was evaluated in Emergency Department on 07/18/2019 for the symptoms described in the history of present illness. He was evaluated in the context of the global COVID-19 pandemic, which necessitated consideration that the patient might be at risk for infection with the SARS-CoV-2 virus that causes COVID-19. Institutional protocols and algorithms that pertain to the evaluation of patients at risk for COVID-19 are in a state of rapid change based on information released by regulatory bodies including the CDC and federal and state organizations. These policies and algorithms were followed during the patient's care in the ED.  Final Clinical Impressions(s) / ED Diagnoses   Final diagnoses:  Viral URI with cough  Atypical chest pain    ED Discharge Orders    None       Aalina Brege, Wenda Overland, MD 07/18/19 1227

## 2019-07-19 ENCOUNTER — Emergency Department (HOSPITAL_BASED_OUTPATIENT_CLINIC_OR_DEPARTMENT_OTHER)
Admission: EM | Admit: 2019-07-19 | Discharge: 2019-07-19 | Disposition: A | Discharge status: Home / Self Care | Payer: Commercial Managed Care - PPO | Attending: Emergency Medicine | Admitting: Emergency Medicine

## 2019-07-19 ENCOUNTER — Other Ambulatory Visit: Payer: Self-pay

## 2019-07-19 ENCOUNTER — Encounter (HOSPITAL_BASED_OUTPATIENT_CLINIC_OR_DEPARTMENT_OTHER): Payer: Self-pay

## 2019-07-19 DIAGNOSIS — Z7982 Long term (current) use of aspirin: Secondary | ICD-10-CM | POA: Insufficient documentation

## 2019-07-19 DIAGNOSIS — Z7984 Long term (current) use of oral hypoglycemic drugs: Secondary | ICD-10-CM | POA: Diagnosis not present

## 2019-07-19 DIAGNOSIS — Z79899 Other long term (current) drug therapy: Secondary | ICD-10-CM | POA: Diagnosis not present

## 2019-07-19 DIAGNOSIS — E119 Type 2 diabetes mellitus without complications: Secondary | ICD-10-CM | POA: Diagnosis not present

## 2019-07-19 DIAGNOSIS — B349 Viral infection, unspecified: Secondary | ICD-10-CM | POA: Diagnosis not present

## 2019-07-19 DIAGNOSIS — Z885 Allergy status to narcotic agent status: Secondary | ICD-10-CM | POA: Insufficient documentation

## 2019-07-19 DIAGNOSIS — I10 Essential (primary) hypertension: Secondary | ICD-10-CM | POA: Diagnosis not present

## 2019-07-19 DIAGNOSIS — R05 Cough: Secondary | ICD-10-CM | POA: Diagnosis present

## 2019-07-19 DIAGNOSIS — E782 Mixed hyperlipidemia: Secondary | ICD-10-CM | POA: Insufficient documentation

## 2019-07-19 MED ORDER — KETOROLAC TROMETHAMINE 15 MG/ML IJ SOLN
15.0000 mg | Freq: Once | INTRAMUSCULAR | Status: AC
  Administered 2019-07-19: 15 mg via INTRAMUSCULAR
  Filled 2019-07-19: qty 1

## 2019-07-19 MED ORDER — DEXAMETHASONE SODIUM PHOSPHATE 10 MG/ML IJ SOLN
10.0000 mg | Freq: Once | INTRAMUSCULAR | Status: AC
  Administered 2019-07-19: 10 mg via INTRAMUSCULAR
  Filled 2019-07-19: qty 1

## 2019-07-19 MED ORDER — MELOXICAM 15 MG PO TABS: 15.0000 mg | ORAL_TABLET | Freq: Every day | ORAL | 0 refills | Status: DC

## 2019-07-19 NOTE — ED Triage Notes (Signed)
Pt seen yesterday for cough, body aches, fever.  States had restless night, no improvement in symptoms.  Body aches continue. Covid test sent yesterday, results pending.  Family request pt be tested for flu.  Pt in no acute distress on arrival.  States last took tylenol at 0200. Also reports buzzing sound in head.

## 2019-07-21 ENCOUNTER — Encounter: Payer: Self-pay | Admitting: Family Medicine

## 2019-07-21 ENCOUNTER — Ambulatory Visit (INDEPENDENT_AMBULATORY_CARE_PROVIDER_SITE_OTHER): Payer: Commercial Managed Care - PPO | Admitting: Family Medicine

## 2019-07-21 ENCOUNTER — Telehealth: Payer: Self-pay | Admitting: *Deleted

## 2019-07-21 ENCOUNTER — Other Ambulatory Visit: Payer: Self-pay

## 2019-07-21 DIAGNOSIS — U071 COVID-19: Secondary | ICD-10-CM

## 2019-07-21 LAB — NOVEL CORONAVIRUS, NAA (HOSP ORDER, SEND-OUT TO REF LAB; TAT 18-24 HRS): SARS-CoV-2, NAA: DETECTED — AB

## 2019-07-21 NOTE — Telephone Encounter (Signed)
Call Type Triage / Clinical Relationship To Patient Self Return Phone Number [phone] (Secondary) Chief Complaint Muscle pain Reason for Call Symptomatic / Request for [place] states went to ED yesterday for COVID19 test Fri Morning; Went back to ED yesterday morning and was given shots for body aches, deep cough, chills and body pain and a steroid shot. Results say 11/06 results 11/07; detected, standard range no detected. Are these the results?  Called patient about above call.  He stated that a nurse did call him and advised of results.  He also states that it hurts to breathe in his chest, his sinuses and face burns, has bad headache, and congestion.  He has been taking what was given by the ER and wants to know if there is anything else he can take to help with these symptoms.  Dr. Lorelei Pont can you help with this patient in Dr. Rhae Lerner absence.   He has a virtual ER follow up on Friday morning.

## 2019-07-21 NOTE — Telephone Encounter (Signed)
Do we want to change his virtual ER follow-up to me and I can see him later today?

## 2019-07-21 NOTE — Telephone Encounter (Signed)
Who Is Calling Patient / Member / Family / Caregiver Call Type Triage / Clinical Caller Name [place] Relationship To Patient Spouse Return Phone Number [phone] (Primary) Chief Complaint BREATHING - shortness of breath or sounds breathless Reason for Call Symptomatic / Request for [place] states her husband has a cough, shortness of breath, chills, chest and back pain, headache. Wants to know what they can do to help. Translation No Nurse Assessment Nurse: Susy Manor, RN, Megan Date/Time (Eastern Time): 07/19/2019 7:21:15 AM Confirm and document reason for call. If symptomatic, describe symptoms. ---Caller states patient had telehealth a week ago. Covid-19 test negative. Caller states he has progressively gotten worse. Caller states patient was taken to ER Friday and he was suspected to have COVID-19 and/or flu. Patient was discharged yesterday. Patient unware of test results. Caller states patient cough, shortness of breath, and his chest hurts. Chest x-ray showed it was clear.  Disp. Time Eilene Ghazi Time) Disposition Final User 07/19/2019 7:19:52 AM Send to Urgent Queue Mariann Laster 07/19/2019 7:29:06 AM Go to ED Now Yes Susy Manor, RN, Jinny Blossom

## 2019-07-21 NOTE — Progress Notes (Signed)
[place] at [facility] [address] [place], [place] [address] [phone]  Date:  07/21/2019   Name:  [name] Foster   DOB:  December 14, 1965   MRN:  [id number]  PCP:  [name], MD    Chief Complaint: No chief complaint on file.   History of Present Illness:  [name] Foster is a 53 y.o. very pleasant male patient who presents with the following:  Virtual visit today to discuss illness- primary pt of Dr Charlett Blake I did see this patient once about a year ago History of hypertension, diabetes, hyperlipidemia Negative cardiac stress 01/2019  His cardiologist is Dr. Oval Linsey Lab Results  Component Value Date   HGBA1C 6.3 10/14/2018   He was seen in the ER recently twice, on November 6 and November 7 On November 6 he had a negative troponin and D-dimer.  Symptoms not thought to be ACS.  They did recommend a repeat COVID-19 which was positive-he had been tested on October 30 and was negative He returned to the ER the next day, November 7, due to continued symptoms. This note is not yet complete.   However it looks like he got a shot of Decadron and Toradol, had an EKG Labs on 11/6 and chest film looked ok  Today he notes that he had a rough night- high fevers and generally feels poorly He notes that he has a fever to 102.7- his face hurts, he has a headache, cough is painful  He is taking tylenol- took 500 mg just recently He last checked his temp just about an hour prior to our visit, temp was 102.7 He is just taking tylenol- not any NSAID.  He feels a bit SOB  He is able to eat a little soup, he is drinking fluids and is urinating  He is having a bit of difficulty with his breathing but not severe They are not checking his oxygen sat or BP  His wife is with him  He is not generally checking his blood sugars.  No suspicion of high glucose  Most recent A1c showed good control Blood pressure slightly elevated at your  visit   BP Readings from Last 3 Encounters:  07/19/19 (!) 150/104  07/18/19 (!) 153/105  02/13/19 122/84    Patient Active Problem List   Diagnosis Date Noted  . Bilateral inguinal hernia without obstruction or gangrene 04/11/2017  . Umbilical hernia without obstruction and without gangrene 04/11/2017  . Vitamin D deficiency 01/24/2017  . Type 2 diabetes mellitus without complication, without long-term current use of insulin ([place]) 01/24/2017  . Depression 01/24/2017  . Shortness of breath on exertion 11/27/2016  . Internal hemorrhoids   . Atypical chest pain   . Gastroesophageal reflux disease without esophagitis   . Dyspnea 01/18/2016  . Preventative health care 10/10/2015  . Sleep apnea 10/10/2015  . Urinary hesitancy 09/30/2015  . Fatty liver 07/04/2015  . Edema 05/07/2014  . Scleroderma of esophagus ([place]) 03/20/2014  . Hiatal hernia with gastroesophageal reflux 03/20/2014  . Anxiety state, unspecified 07/13/2013  . Erectile dysfunction 01/15/2013  . Back pain 03/09/2012  . 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 Heel pain, bilateral 04/25/2010  . Obesity 01/05/2010  . Hyperlipidemia 10/04/2009  . Essential hypertension 10/04/2009    Past Medical History:  Diagnosis Date  . Bone spur    left foot  . Chest pain   . Diabetes mellitus type 2 in obese ([place]) 07/13/2013  .  Diverticulosis   . Erectile dysfunction 01/15/2013  . Esophageal reflux 01/15/2013  . Fatty liver 07/04/2015  . GERD (gastroesophageal reflux disease)   . Hiatal hernia   . Hiatal hernia with gastroesophageal reflux 03/20/2014  . Hyperglycemia 07/13/2013  . Hyperplastic colon polyp   . Hypertension   . Internal hemorrhoids   . Mixed hyperlipidemia   . Murmur   . OSA (obstructive sleep apnea)    s/p UPPP  . Plantar fasciitis   . Prediabetes   . Reflux esophagitis   . Stomach ulcer    from PCP  . Urinary hesitancy 09/30/2015    Past Surgical History:  Procedure Laterality Date  . [place] STUDY N/A 02/28/2016   Procedure: [place] STUDY;  Surgeon: Jerene Bears, MD;  Location: [facility];  Service: Gastroenterology;  Laterality: N/A;  . ESOPHAGEAL MANOMETRY N/A 09/01/2013   Procedure: ESOPHAGEAL MANOMETRY (EM);  Surgeon: Sable Feil, MD;  Location: [facility];  Service: Endoscopy;  Laterality: N/A;  . ESOPHAGEAL MANOMETRY N/A 02/28/2016   Procedure: ESOPHAGEAL MANOMETRY (EM);  Surgeon: Jerene Bears, MD;  Location: [facility];  Service: Gastroenterology;  Laterality: N/A;  . INGUINAL HERNIA REPAIR    . TONSILLECTOMY    . UMBILICAL HERNIA REPAIR    . UVULECTOMY      Social History   Tobacco Use  . Smoking status: Never Smoker  . Smokeless tobacco: Never Used  Substance Use Topics  . Alcohol use: No  . Drug use: No    Family History  Problem Relation Age of Onset  . Dementia Mother   . Diabetes Mother   . Hypertension Mother   . Heart failure Mother   . Stroke Mother   . Obesity Mother   . Hypertension Other        siblings  . Diabetes Sister   . Cancer Brother        lung cancer smoker  . Heart attack Maternal Grandmother   . Diabetes Sister   . Pancreatic disease Sister   . Obesity Father   . Colon cancer Neg Hx     Allergies  Allergen Reactions  . Hydrocodone Itching  . Tramadol Itching    Medication list has been reviewed and updated.  Current Outpatient Medications on File Prior to Visit  Medication Sig Dispense Refill  . acetaminophen (TYLENOL) 500 MG tablet Take 1 tablet (500 mg total) by mouth every 6 (six) hours as needed. 60 tablet 0  . albuterol (PROVENTIL HFA;VENTOLIN HFA) 108 (90 Base) MCG/ACT inhaler Inhale 2 puffs into the lungs every 6 (six) hours as needed for wheezing or shortness of breath. 1 Inhaler 0  . amLODipine (NORVASC) 5 MG tablet Take 1 tablet by mouth once daily 90 tablet 1  . aspirin EC 81 MG tablet Take 81 mg by mouth daily.    [name] BAYER MICROLET LANCETS lancets Test blood sugars twice daily 100 each 0  .  benzonatate (TESSALON) 100 MG capsule Take 2 capsules (200 mg total) by mouth 2 (two) times daily as needed for cough. 16 capsule 0  . Blood Glucose Monitoring Suppl (CONTOUR NEXT MONITOR) w/Device KIT 1 kit by Does not apply route 2 (two) times daily. 1 kit 0  . diclofenac (VOLTAREN) 75 MG EC tablet Take 1 tablet (75 mg total) by mouth 2 (two) times daily. 60 tablet 2  . famotidine (PEPCID) 40 MG tablet Take 1 tablet (40 mg total) by mouth daily. 30 tablet 4  .  fluticasone (FLONASE) 50 MCG/ACT nasal spray Place 2 sprays into both nostrils daily. 16 g 6  . glucose blood (CONTOUR NEXT TEST) test strip Test blood sugars twice daily 100 each 0  . meloxicam (MOBIC) 15 MG tablet Take 1 tablet (15 mg total) by mouth daily. 7 tablet 0  . metFORMIN (GLUCOPHAGE) 500 MG tablet Take 1 tablet (500 mg total) by mouth daily with breakfast. (Patient not taking: Reported on 07/14/2019) 90 tablet 1  . Multiple Vitamin (MULTIVITAMIN) tablet Take 1 tablet by mouth daily.    [name] omeprazole (PRILOSEC) 40 MG capsule Take 1 capsule (40 mg total) by mouth every morning. 30 capsule 3  . ondansetron (ZOFRAN) 4 MG tablet Take 4 mg by mouth every 8 (eight) hours as needed for nausea or vomiting.    . pantoprazole (PROTONIX) 40 MG tablet Take 1 tablet (40 mg total) by mouth 2 (two) times daily. 30 tablet 3  . rosuvastatin (CRESTOR) 10 MG tablet Take 1 tablet by mouth once daily 30 tablet 6  . tadalafil (CIALIS) 5 MG tablet Take 1 tablet (5 mg total) by mouth daily as needed for erectile dysfunction. 30 tablet 5  . triamterene-hydrochlorothiazide (MAXZIDE-25) 37.5-25 MG tablet Take 1 tablet by mouth once daily 90 tablet 0  . Vitamin D, Ergocalciferol, (DRISDOL) 1.25 MG (50000 UT) CAPS capsule TAKE 1 CAPSULE BY MOUTH ONCE A WEEK FOR  12  WEEKS 4 capsule 0  . [DISCONTINUED] amitriptyline (ELAVIL) 25 MG tablet Take 1 tablet (25 mg total) by mouth at bedtime. (Patient not taking: Reported on 07/14/2019) 30 tablet 2  . [DISCONTINUED]  hyoscyamine (LEVSIN SL) 0.125 MG SL tablet Place 1 tablet (0.125 mg total) under the tongue every 4 (four) hours as needed. (Patient not taking: Reported on 07/14/2019) 30 tablet 5   No current facility-administered medications on file prior to visit.     Review of Systems:  As per HPI- otherwise negative.   Physical Examination: There were no vitals filed for this visit. There were no vitals filed for this visit. There is no height or weight on file to calculate BMI. Ideal Body Weight:    Pt observed over video -no cough or shortness of breath is observed.  However he is seen in a dark room due to headache. He has not taken any vital signs at home  Assessment and Plan: COVID-19  Patient with known COVID-19 infection, positive test on 11/6.  He is having high fevers, body aches, headache.  He is treating his fevers but can be more aggressive, advised that he can take 1000 mg of Tylenol every 6 hours as needed, can also use ibuprofen.  Sent in a MyChart message with more specific instructions regarding antipyretics.  However, if his symptoms are not coming under control I do think he should return to the hospital.  I asked him to take another 500 mg of Tylenol and 400 mg ibuprofen.  If not feeling significantly improved and fever reduced in 1 to 2 hours he will return to the ER.  He states understanding and agreement Signed Lamar Blinks, MD

## 2019-07-21 NOTE — Telephone Encounter (Signed)
Thanks I can also see him tomorrow if he would like and it is not urgent

## 2019-07-21 NOTE — Telephone Encounter (Signed)
Appointment scheduled with Dr.Copland  

## 2019-07-21 NOTE — Telephone Encounter (Signed)
Just FYI  Patient was sent home on 07/18/19 and went back on 07/19/19.  Patient was positive for covid.

## 2019-07-21 NOTE — Telephone Encounter (Signed)
Patient is being seen today

## 2019-07-22 ENCOUNTER — Telehealth (HOSPITAL_COMMUNITY): Payer: Self-pay

## 2019-07-22 NOTE — Telephone Encounter (Signed)
Pt. Called to discuss his visit last night at [facility].  We discussed s/s of sob, chest pain, difficult breathing any symptoms that are concerning to return to the ED for medical care and inform of +Covid status.  Discussed with pt. Taking Acetaminophen or Ibuprofen for fever, body aches.  Pt. Reports that he will be contacting his PCP today for a follow-up visit.  All questions answered.

## 2019-07-22 NOTE — ED Provider Notes (Signed)
[facility] EMERGENCY DEPARTMENT Provider Note   CSN: [id number] Arrival date & time: 07/19/19  4098     History   Chief Complaint Chief Complaint  Patient presents with  . Cough  . Generalized Body Aches    HPI [name] Foster is a 53 y.o. male.  HPI   53 year old male with generalized body aches.  Cough.  States that he did not sleep well at all last night.  Feels achy.  Nauseated.  He was seen in the emergency room yesterday for the same.  He had pretty extensive work-up which I reviewed which is fairly unremarkable.  He is presenting today because he essentially does not feel significantly better.  Past Medical History:  Diagnosis Date  . Bone spur    left foot  . Chest pain   . Diabetes mellitus type 2 in obese ([place]) 07/13/2013  . Diverticulosis   . Erectile dysfunction 01/15/2013  . Esophageal reflux 01/15/2013  . Fatty liver 07/04/2015  . GERD (gastroesophageal reflux disease)   . Hiatal hernia   . Hiatal hernia with gastroesophageal reflux 03/20/2014  . Hyperglycemia 07/13/2013  . Hyperplastic colon polyp   . Hypertension   . Internal hemorrhoids   . Mixed hyperlipidemia   . Murmur   . OSA (obstructive sleep apnea)    s/p UPPP  . Plantar fasciitis   . Prediabetes   . Reflux esophagitis   . Stomach ulcer    from PCP  . Urinary hesitancy 09/30/2015    Patient Active Problem List   Diagnosis Date Noted  . Bilateral inguinal hernia without obstruction or gangrene 04/11/2017  . Umbilical hernia without obstruction and without gangrene 04/11/2017  . Vitamin D deficiency 01/24/2017  . Type 2 diabetes mellitus without complication, without long-term current use of insulin ([place]) 01/24/2017  . Depression 01/24/2017  . Shortness of breath on exertion 11/27/2016  . Internal hemorrhoids   . Atypical chest pain   . Gastroesophageal reflux disease without esophagitis   . Dyspnea 01/18/2016  . Preventative health care 10/10/2015  . Sleep apnea 10/10/2015  .  Urinary hesitancy 09/30/2015  . Fatty liver 07/04/2015  . Edema 05/07/2014  . Scleroderma of esophagus ([place]) 03/20/2014  . Hiatal hernia with gastroesophageal reflux 03/20/2014  . Anxiety state, unspecified 07/13/2013  . Erectile dysfunction 01/15/2013  . Back pain 03/09/2012  . Mesenteric panniculitis ([place]) 02/06/2012  . Heel pain, bilateral 04/25/2010  . Obesity 01/05/2010  . Hyperlipidemia 10/04/2009  . Essential hypertension 10/04/2009   Past Surgical History:  Procedure Laterality Date  . [place] STUDY N/A 02/28/2016   Procedure: [place] STUDY;  Surgeon: Jerene Bears, MD;  Location: [facility];  Service: Gastroenterology;  Laterality: N/A;  . ESOPHAGEAL MANOMETRY N/A 09/01/2013   Procedure: ESOPHAGEAL MANOMETRY (EM);  Surgeon: Sable Feil, MD;  Location: [facility];  Service: Endoscopy;  Laterality: N/A;  . ESOPHAGEAL MANOMETRY N/A 02/28/2016   Procedure: ESOPHAGEAL MANOMETRY (EM);  Surgeon: Jerene Bears, MD;  Location: [facility];  Service: Gastroenterology;  Laterality: N/A;  . INGUINAL HERNIA REPAIR    . TONSILLECTOMY    . UMBILICAL HERNIA REPAIR    . UVULECTOMY       Home Medications    Prior to Admission medications   Medication Sig Start Date End Date Taking? Authorizing Provider  acetaminophen (TYLENOL) 500 MG tablet Take 1 tablet (500 mg total) by mouth every 6 (six) hours as needed. 05/24/16   [name], MD  albuterol (PROVENTIL HFA;VENTOLIN HFA) 108 (90 Base) MCG/ACT inhaler Inhale 2 puffs into the lungs every 6 (six) hours as needed for wheezing or shortness of breath. 12/06/17   Debbrah Alar, NP  amLODipine (NORVASC) 5 MG tablet Take 1 tablet by mouth once daily 01/15/19   Copland, Gay Filler, MD  aspirin EC 81 MG tablet Take 81 mg by mouth daily.    [provider]  BAYER MICROLET LANCETS lancets Test blood sugars twice daily 10/12/17   Shelda Pal, DO  benzonatate (TESSALON) 100 MG capsule Take 2 capsules (200 mg  total) by mouth 2 (two) times daily as needed for cough. 07/18/19   Little, Wenda Overland, MD  Blood Glucose Monitoring Suppl (CONTOUR NEXT MONITOR) w/Device KIT 1 kit by Does not apply route 2 (two) times daily. 03/15/17   Dennard Nip D, MD  diclofenac (VOLTAREN) 75 MG EC tablet Take 1 tablet (75 mg total) by mouth 2 (two) times daily. 06/19/19   Wallene Huh, DPM  famotidine (PEPCID) 40 MG tablet Take 1 tablet (40 mg total) by mouth daily. 10/14/18   [name], MD  fluticasone (FLONASE) 50 MCG/ACT nasal spray Place 2 sprays into both nostrils daily. 06/11/18   [name], MD  glucose blood (CONTOUR NEXT TEST) test strip Test blood sugars twice daily 10/12/17   Shelda Pal, DO  meloxicam (MOBIC) 15 MG tablet Take 1 tablet (15 mg total) by mouth daily. 07/19/19   Virgel Manifold, MD  metFORMIN (GLUCOPHAGE) 500 MG tablet Take 1 tablet (500 mg total) by mouth daily with breakfast. Patient not taking: Reported on 07/14/2019 02/03/19   [name], MD  Multiple Vitamin (MULTIVITAMIN) tablet Take 1 tablet by mouth daily.    [provider]  omeprazole (PRILOSEC) 40 MG capsule Take 1 capsule (40 mg total) by mouth every morning. 02/13/19   Pyrtle, Lajuan Lines, MD  ondansetron (ZOFRAN) 4 MG tablet Take 4 mg by mouth every 8 (eight) hours as needed for nausea or vomiting.    [provider]  pantoprazole (PROTONIX) 40 MG tablet Take 1 tablet (40 mg total) by mouth 2 (two) times daily. 02/03/19   [name], MD  rosuvastatin (CRESTOR) 10 MG tablet Take 1 tablet by mouth once daily 02/21/19   Richardo Priest, MD  tadalafil (CIALIS) 5 MG tablet Take 1 tablet (5 mg total) by mouth daily as needed for erectile dysfunction. 12/06/17   Debbrah Alar, NP  triamterene-hydrochlorothiazide [facility]) 37.5-25 MG tablet Take 1 tablet by mouth once daily 05/26/19   [name], MD  Vitamin D, Ergocalciferol, (DRISDOL) 1.25 MG (50000 UT) CAPS capsule TAKE 1 CAPSULE BY MOUTH ONCE A  WEEK FOR  12  WEEKS 05/27/19   [name], MD  amitriptyline (ELAVIL) 25 MG tablet Take 1 tablet (25 mg total) by mouth at bedtime. Patient not taking: Reported on 07/14/2019 02/13/19 07/18/19  Pyrtle, Lajuan Lines, MD  hyoscyamine (LEVSIN SL) 0.125 MG SL tablet Place 1 tablet (0.125 mg total) under the tongue every 4 (four) hours as needed. Patient not taking: Reported on 07/14/2019 06/11/18 07/18/19  [name], MD   Family History Family History  Problem Relation Age of Onset  . Dementia Mother   . Diabetes Mother   . Hypertension Mother   . Heart failure Mother   . Stroke Mother   . Obesity Mother   . Hypertension Other        siblings  . Diabetes Sister   .  Cancer Brother        lung cancer smoker  . Heart attack Maternal Grandmother   . Diabetes Sister   . Pancreatic disease Sister   . Obesity Father   . Colon cancer Neg Hx    Social History Social History   Tobacco Use  . Smoking status: Never Smoker  . Smokeless tobacco: Never Used  Substance Use Topics  . Alcohol use: No  . Drug use: No    Allergies   Hydrocodone and Tramadol  Review of Systems Review of Systems  All systems reviewed and negative, other than as noted in HPI.  Physical Exam Updated Vital Signs BP (!) 150/104 (BP Location: Right Arm)   Pulse 84   Temp 99.1 F (37.3 C) (Oral)   Resp 18   Ht 6' 1" (1.854 m)   Wt 118 kg   SpO2 98%   BMI 34.32 kg/m   Physical Exam Vitals signs and nursing note reviewed.  Constitutional:      General: He is not in acute distress.    Appearance: He is well-developed.  HENT:     Head: Normocephalic and atraumatic.  Eyes:     General:        Right eye: No discharge.        Left eye: No discharge.     Conjunctiva/sclera: Conjunctivae normal.  Neck:     Musculoskeletal: Neck supple.  Cardiovascular:     Rate and Rhythm: Normal rate and regular rhythm.     Heart sounds: Normal heart sounds. No murmur. No friction rub. No gallop.   Pulmonary:      Effort: Pulmonary effort is normal. No respiratory distress.     Breath sounds: Normal breath sounds.  Abdominal:     General: There is no distension.     Palpations: Abdomen is soft.     Tenderness: There is no abdominal tenderness.  Musculoskeletal:        General: No tenderness.     Comments: Lower extremities symmetric as compared to each other. No calf tenderness. Negative Homan's. No palpable cords.   Skin:    General: Skin is warm and dry.  Neurological:     Mental Status: He is alert.  Psychiatric:        Behavior: Behavior normal.        Thought Content: Thought content normal.      ED Treatments / Results  Labs (all labs ordered are listed, but only abnormal results are displayed) Labs Reviewed - No data to display  EKG EKG Interpretation  Date/Time:  Saturday July 19 2019 08:58:49 EST Ventricular Rate:  93 PR Interval:    QRS Duration: 87 QT Interval:  341 QTC Calculation: 425 R Axis:   62 Text Interpretation: Sinus rhythm Probable left atrial enlargement Borderline T abnormalities, inferior leads Borderline ST elevation, lateral leads isolated new T wave inversion III, otherwise similar to previous Confirmed by Theotis Burrow [phone]) on 07/20/2019 6:27:29 PM   Radiology No results found.   Dg Chest Port 1 View  Result Date: 07/18/2019 CLINICAL DATA:  Chest pain and shortness of breath. EXAM: PORTABLE CHEST 1 VIEW COMPARISON:  November 26, 2018 FINDINGS: The lungs are clear. Heart is mildly enlarged with pulmonary vascularity normal. No adenopathy. No bone lesions. IMPRESSION: Mild cardiomegaly.  No edema or consolidation. Electronically Signed   By: Lowella Grip III M.D.   On: 07/18/2019 11:07    Procedures Procedures (including critical care time)  Medications Ordered in ED  Medications  ketorolac (TORADOL) 15 MG/ML injection 15 mg (15 mg Intramuscular Given 07/19/19 0929)  dexamethasone (DECADRON) injection 10 mg (10 mg Intramuscular Given 07/19/19  0931)     Initial Impression / Assessment and Plan / ED Course  I have reviewed the triage vital signs and the nursing notes.  Pertinent labs & imaging results that were available during my care of the patient were reviewed by me and considered in my medical decision making (see chart for details).        53 year old male with what I suspect is a viral respiratory illness.  I reviewed his work-up in the ER yesterday.  I do not feel that further testing is needed at this time.  Even if he has influenza, it would not change management.  Nontoxic.  Oxygen saturations are fine on room air.  Work of breathing is not significantly increased.  Chest x-ray without focal abnormality.  Plan continue symptomatic treatment.  Return precautions discussed.  Final Clinical Impressions(s) / ED Diagnoses   Final diagnoses:  Viral syndrome    ED Discharge Orders         Ordered    meloxicam (MOBIC) 15 MG tablet  Daily     07/19/19 0914           Virgel Manifold, MD 07/22/19 1043

## 2019-07-23 ENCOUNTER — Emergency Department (HOSPITAL_BASED_OUTPATIENT_CLINIC_OR_DEPARTMENT_OTHER): Payer: Commercial Managed Care - PPO

## 2019-07-23 ENCOUNTER — Encounter (HOSPITAL_BASED_OUTPATIENT_CLINIC_OR_DEPARTMENT_OTHER): Payer: Self-pay | Admitting: Emergency Medicine

## 2019-07-23 ENCOUNTER — Inpatient Hospital Stay (HOSPITAL_BASED_OUTPATIENT_CLINIC_OR_DEPARTMENT_OTHER)
Admission: EM | Admit: 2019-07-23 | Discharge: 2019-07-28 | DRG: 177 | Disposition: A | Discharge status: Home / Self Care | Payer: Commercial Managed Care - PPO | Attending: Internal Medicine | Admitting: Internal Medicine

## 2019-07-23 ENCOUNTER — Ambulatory Visit (INDEPENDENT_AMBULATORY_CARE_PROVIDER_SITE_OTHER): Payer: Commercial Managed Care - PPO | Admitting: Medical

## 2019-07-23 ENCOUNTER — Other Ambulatory Visit: Payer: Self-pay

## 2019-07-23 ENCOUNTER — Ambulatory Visit: Payer: Self-pay | Admitting: *Deleted

## 2019-07-23 DIAGNOSIS — J1282 Pneumonia due to coronavirus disease 2019: Secondary | ICD-10-CM | POA: Diagnosis present

## 2019-07-23 DIAGNOSIS — J1289 Other viral pneumonia: Secondary | ICD-10-CM | POA: Diagnosis present

## 2019-07-23 DIAGNOSIS — I10 Essential (primary) hypertension: Secondary | ICD-10-CM | POA: Diagnosis present

## 2019-07-23 DIAGNOSIS — Z79899 Other long term (current) drug therapy: Secondary | ICD-10-CM | POA: Diagnosis not present

## 2019-07-23 DIAGNOSIS — R05 Cough: Secondary | ICD-10-CM

## 2019-07-23 DIAGNOSIS — U071 COVID-19: Secondary | ICD-10-CM | POA: Diagnosis present

## 2019-07-23 DIAGNOSIS — K760 Fatty (change of) liver, not elsewhere classified: Secondary | ICD-10-CM | POA: Diagnosis present

## 2019-07-23 DIAGNOSIS — K219 Gastro-esophageal reflux disease without esophagitis: Secondary | ICD-10-CM | POA: Diagnosis present

## 2019-07-23 DIAGNOSIS — E785 Hyperlipidemia, unspecified: Secondary | ICD-10-CM | POA: Diagnosis not present

## 2019-07-23 DIAGNOSIS — E119 Type 2 diabetes mellitus without complications: Secondary | ICD-10-CM | POA: Diagnosis present

## 2019-07-23 DIAGNOSIS — Z791 Long term (current) use of non-steroidal anti-inflammatories (NSAID): Secondary | ICD-10-CM | POA: Diagnosis not present

## 2019-07-23 DIAGNOSIS — J9601 Acute respiratory failure with hypoxia: Secondary | ICD-10-CM | POA: Diagnosis present

## 2019-07-23 DIAGNOSIS — R197 Diarrhea, unspecified: Secondary | ICD-10-CM

## 2019-07-23 DIAGNOSIS — R112 Nausea with vomiting, unspecified: Secondary | ICD-10-CM

## 2019-07-23 DIAGNOSIS — Z7982 Long term (current) use of aspirin: Secondary | ICD-10-CM

## 2019-07-23 DIAGNOSIS — E782 Mixed hyperlipidemia: Secondary | ICD-10-CM | POA: Diagnosis present

## 2019-07-23 DIAGNOSIS — Z7951 Long term (current) use of inhaled steroids: Secondary | ICD-10-CM | POA: Diagnosis not present

## 2019-07-23 DIAGNOSIS — R7989 Other specified abnormal findings of blood chemistry: Secondary | ICD-10-CM | POA: Diagnosis not present

## 2019-07-23 DIAGNOSIS — R059 Cough, unspecified: Secondary | ICD-10-CM

## 2019-07-23 DIAGNOSIS — R509 Fever, unspecified: Secondary | ICD-10-CM

## 2019-07-23 DIAGNOSIS — R0602 Shortness of breath: Secondary | ICD-10-CM | POA: Diagnosis present

## 2019-07-23 HISTORY — DX: COVID-19: U07.1

## 2019-07-23 LAB — COMPREHENSIVE METABOLIC PANEL
ALT: 46 U/L — ABNORMAL HIGH (ref 0–44)
AST: 35 U/L (ref 15–41)
Albumin: 3.5 g/dL (ref 3.5–5.0)
Alkaline Phosphatase: 59 U/L (ref 38–126)
Anion gap: 10 (ref 5–15)
BUN: 13 mg/dL (ref 6–20)
CO2: 24 mmol/L (ref 22–32)
Calcium: 8.2 mg/dL — ABNORMAL LOW (ref 8.9–10.3)
Chloride: 99 mmol/L (ref 98–111)
Creatinine, Ser: 1.17 mg/dL (ref 0.61–1.24)
GFR calc Af Amer: >60 mL/min (ref >60)
GFR calc non Af Amer: >60 mL/min (ref >60)
Glucose, Bld: 108 mg/dL — ABNORMAL HIGH (ref 70–99)
Potassium: 3.8 mmol/L (ref 3.5–5.1)
Sodium: 133 mmol/L — ABNORMAL LOW (ref 135–145)
Total Bilirubin: 0.4 mg/dL (ref 0.3–1.2)
Total Protein: 7.4 g/dL (ref 6.5–8.1)

## 2019-07-23 LAB — CBC
HCT: 53.4 % — ABNORMAL HIGH (ref 39.0–52.0)
Hemoglobin: 17.5 g/dL — ABNORMAL HIGH (ref 13.0–17.0)
MCH: 28.2 pg (ref 26.0–34.0)
MCHC: 32.8 g/dL (ref 30.0–36.0)
MCV: 86.1 fL (ref 80.0–100.0)
Platelets: 149 10*3/uL — ABNORMAL LOW (ref 150–400)
RBC: 6.2 MIL/uL — ABNORMAL HIGH (ref 4.22–5.81)
RDW: 14.4 % (ref 11.5–15.5)
WBC: 3.9 10*3/uL — ABNORMAL LOW (ref 4.0–10.5)
nRBC: 0 % (ref 0.0–0.2)

## 2019-07-23 LAB — URINALYSIS, ROUTINE W REFLEX MICROSCOPIC
Bilirubin Urine: NEGATIVE
Glucose, UA: NEGATIVE mg/dL
Ketones, ur: 15 mg/dL — AB
Leukocytes,Ua: NEGATIVE
Nitrite: NEGATIVE
Protein, ur: >300 mg/dL — AB
Specific Gravity, Urine: >1.03 — ABNORMAL HIGH (ref 1.005–1.030)
pH: 6 (ref 5.0–8.0)

## 2019-07-23 LAB — FERRITIN: Ferritin: 2038 ng/mL — ABNORMAL HIGH (ref 24–336)

## 2019-07-23 LAB — D-DIMER, QUANTITATIVE: D-Dimer, Quant: 0.87 ug/mL-FEU — ABNORMAL HIGH (ref 0.00–0.50)

## 2019-07-23 LAB — LACTIC ACID, PLASMA
Lactic Acid, Venous: 2.2 mmol/L (ref 0.5–1.9)
Lactic Acid, Venous: 1.2 mmol/L (ref 0.5–1.9)

## 2019-07-23 LAB — URINALYSIS, MICROSCOPIC (REFLEX)

## 2019-07-23 LAB — LIPASE, BLOOD: Lipase: 36 U/L (ref 11–51)

## 2019-07-23 LAB — FIBRINOGEN: Fibrinogen: 594 mg/dL — ABNORMAL HIGH (ref 210–475)

## 2019-07-23 LAB — TRIGLYCERIDES: Triglycerides: 132 mg/dL (ref <150)

## 2019-07-23 LAB — PROCALCITONIN: Procalcitonin: <0.1 ng/mL

## 2019-07-23 LAB — LACTATE DEHYDROGENASE: LDH: 212 U/L — ABNORMAL HIGH (ref 98–192)

## 2019-07-23 LAB — C-REACTIVE PROTEIN: CRP: 7.4 mg/dL — ABNORMAL HIGH (ref <1.0)

## 2019-07-23 MED ORDER — KETOROLAC TROMETHAMINE 15 MG/ML IJ SOLN
15.0000 mg | Freq: Once | INTRAMUSCULAR | Status: AC
  Administered 2019-07-23: 15 mg via INTRAVENOUS
  Filled 2019-07-23: qty 1

## 2019-07-23 MED ORDER — ONDANSETRON HCL 4 MG/2ML IJ SOLN
4.0000 mg | Freq: Once | INTRAMUSCULAR | Status: AC | PRN
  Administered 2019-07-23: 4 mg via INTRAVENOUS
  Filled 2019-07-23: qty 2

## 2019-07-23 MED ORDER — METHYLPREDNISOLONE SODIUM SUCC 125 MG IJ SOLR
80.0000 mg | Freq: Once | INTRAMUSCULAR | Status: AC
  Administered 2019-07-23: 80 mg via INTRAVENOUS
  Filled 2019-07-23: qty 2

## 2019-07-23 MED ORDER — FAMOTIDINE 20 MG PO TABS
40.0000 mg | ORAL_TABLET | Freq: Once | ORAL | Status: AC
  Administered 2019-07-23: 40 mg via ORAL
  Filled 2019-07-23: qty 2

## 2019-07-23 MED ORDER — SODIUM CHLORIDE 0.9 % IV BOLUS
1000.0000 mL | Freq: Once | INTRAVENOUS | Status: AC
  Administered 2019-07-23: 1000 mL via INTRAVENOUS

## 2019-07-23 MED ORDER — ACETAMINOPHEN 500 MG PO TABS
1000.0000 mg | ORAL_TABLET | Freq: Once | ORAL | Status: AC
  Administered 2019-07-23: 1000 mg via ORAL
  Filled 2019-07-23: qty 2

## 2019-07-23 MED ORDER — KETOROLAC TROMETHAMINE 15 MG/ML IJ SOLN: 15.0000 mg | Freq: Once | INTRAMUSCULAR | Status: DC

## 2019-07-23 MED ORDER — ACETAMINOPHEN 325 MG PO TABS
650.00 | ORAL_TABLET | ORAL | Status: DC
Start: ? — End: 2019-07-23

## 2019-07-23 NOTE — Patient Instructions (Addendum)
53 year old diabetic patient with viral syndrome type symptoms since around October 30.  Progressively has gotten worse and has had 3 emergency department visits since onset of symptoms.  Recently had Covid positive test that came back on November 7.  Since then he describes not improving.  Now has high fever of 103 despite Tylenol and ibuprofen.  Today he has begun having loose stools with vomiting.  He feels weak and expresses concern for dehydration.  I do think in light of presentation/course of illness there is best for him to be evaluated in the emergency department to get O2 sat monitoring and repeat lab work-up to determine if he needs admission or not.  Explained patient he could be seen at [facility] ED or ED at the med center.  He expresses preference for ED at the med center.  I explained to him that I would call down and talk with the ED physician and explained recent presentation/signs and symptoms.  Follow-up with [place] as needed.  Did notify ED MD of patient presentation.

## 2019-07-23 NOTE — ED Triage Notes (Addendum)
Tested positive for Covid on 11/6. Symptoms worsening.  Vomiting and diarrhea started today.  Headache, Chest Pain, Abd pain, Chills, fever to 103, weakness, fatigue.

## 2019-07-23 NOTE — Progress Notes (Signed)
Subjective:    Patient ID: [name] Foster, male    DOB: 1965/09/30, 53 y.o.   MRN: [id number]  HPI  Virtual Visit via Video Note  I connected with [name] Foster on 07/23/19 at  1:00 PM EST by a video enabled telemedicine application and verified that I am speaking with the correct person using two identifiers.  Location: Patient: home Provider: office   I discussed the limitations of evaluation and management by telemedicine and the availability of in person appointments. The patient expressed understanding and agreed to proceed.  Pt states he is still short of breath. Pt can here himself wheeze.      History of Present Illness:   Pt in for office visit. Pt states on oct 30 by Dr Nani Ravens for upper respiratorytype symptoms. He was dx with covid on Jul 19, 2019.   Pt has no 02 sat monitor.  Pt states recent onset of diarrhea and vomiting. Pt vomited about 4 times today. Feels weak and dehydrated.  Pt temp 103 today. He states temp won't come down despite use of tylenol and ibuprofen.   Observations/Objective:  General-Looks uncomfortable on exam with grimace to face entire exam. But pleasant. Lungs- could not inspect anterior chest well.. Neck- no tracheal deviation or jvd on inspection. Neuro- gross motor function appears intact.  Assessment and Plan: 53 year old diabetic patient with viral syndrome type symptoms since around October 30.  Progressively has gotten worse and has had 3 emergency department visits since onset of symptoms.  Recently had Covid positive test that came back on November 7.  Since then he describes not improving.  Now has high fever of 103 despite Tylenol and ibuprofen.  Today he has begun having loose stools with vomiting.  He feels weak and expresses concern for dehydration.  I do think in light of presentation/course of illness there is best for him to be evaluated in the emergency department to get O2 sat monitoring and repeat lab work-up to  determine if he needs admission or not.  Explained patient he could be seen at [facility] ED or ED at the med center.  He expresses preference for ED at the med center.  I explained to him that I would call down and talk with the ED physician and explained recent presentation/signs and symptoms.  Follow-up with [place] as needed.  Did notify ED MD of patient presentation.  Follow Up Instructions:    I discussed the assessment and treatment plan with the patient. The patient was provided an opportunity to ask questions and all were answered. The patient agreed with the plan and demonstrated an understanding of the instructions.   The patient was advised to call back or seek an in-person evaluation if the symptoms worsen or if the condition fails to improve as anticipated.  I provided 25 minutes of non-face-to-face time during this encounter.   Mackie Pai, PA-C   Review of Systems  Constitutional: Positive for fatigue and fever. Negative for chills.  Respiratory: Positive for cough, shortness of breath and wheezing.   Cardiovascular: Negative for chest pain and palpitations.  Gastrointestinal: Positive for abdominal pain, diarrhea, nausea and vomiting. Negative for abdominal distention, blood in stool and constipation.  Musculoskeletal: Negative for back pain.  Skin: Negative for rash.  Neurological: Positive for weakness. Negative for dizziness, seizures, speech difficulty and headaches.  Hematological: Negative for adenopathy. Does not bruise/bleed easily.  Psychiatric/Behavioral: Negative for behavioral problems, decreased concentration, hallucinations, sleep disturbance and suicidal ideas. The patient is  not nervous/anxious.        Objective:   Physical Exam        Assessment & Plan:

## 2019-07-23 NOTE — ED Notes (Signed)
Pt able to tolerated po fluids. Denies nausea. No vomiting. Pt c/o 9/10 headache and body aches. Voided 467ml  Clear urine.

## 2019-07-23 NOTE — ED Notes (Signed)
SPO2 90 on R/A. Placed on 2l/m [place]

## 2019-07-23 NOTE — ED Notes (Signed)
Attempted to update pt's wife about room. No answer on phone. PT has cell phone at bedside.

## 2019-07-23 NOTE — ED Provider Notes (Signed)
[place] EMERGENCY DEPARTMENT Provider Note   CSN: [id number] Arrival date & time: 07/23/19  1357     History   Chief Complaint Chief Complaint  Patient presents with  . SOB, HA, Covid+, V/D, CP    HPI [name] Foster is a 53 y.o. male.     The history is provided by the patient.  URI Presenting symptoms: cough, fatigue and fever   Presenting symptoms: no ear pain and no sore throat   Severity:  Moderate Onset quality:  Gradual Timing:  Constant Progression:  Unchanged Chronicity:  New Relieved by:  OTC medications Worsened by:  Nothing Associated symptoms: arthralgias, headaches and myalgias   Risk factors: recent illness (COVID positive)     Past Medical History:  Diagnosis Date  . Bone spur    left foot  . Chest pain   . COVID-19   . Diabetes mellitus type 2 in obese ([place]) 07/13/2013  . Diverticulosis   . Erectile dysfunction 01/15/2013  . Esophageal reflux 01/15/2013  . Fatty liver 07/04/2015  . GERD (gastroesophageal reflux disease)   . Hiatal hernia   . Hiatal hernia with gastroesophageal reflux 03/20/2014  . Hyperglycemia 07/13/2013  . Hyperplastic colon polyp   . Hypertension   . Internal hemorrhoids   . Mixed hyperlipidemia   . Murmur   . OSA (obstructive sleep apnea)    s/p UPPP  . Plantar fasciitis   . Prediabetes   . Reflux esophagitis   . Stomach ulcer    from PCP  . Urinary hesitancy 09/30/2015    Patient Active Problem List   Diagnosis Date Noted  . Pneumonia due to COVID-19 virus 07/23/2019  . Bilateral inguinal hernia without obstruction or gangrene 04/11/2017  . Umbilical hernia without obstruction and without gangrene 04/11/2017  . Vitamin D deficiency 01/24/2017  . Type 2 diabetes mellitus without complication, without long-term current use of insulin ([place]) 01/24/2017  . Depression 01/24/2017  . Shortness of breath on exertion 11/27/2016  . Internal hemorrhoids   . Atypical chest pain   . Gastroesophageal reflux  disease without esophagitis   . Dyspnea 01/18/2016  . Preventative health care 10/10/2015  . Sleep apnea 10/10/2015  . Urinary hesitancy 09/30/2015  . Fatty liver 07/04/2015  . Edema 05/07/2014  . Scleroderma of esophagus ([place]) 03/20/2014  . Hiatal hernia with gastroesophageal reflux 03/20/2014  . Anxiety state, unspecified 07/13/2013  . Erectile dysfunction 01/15/2013  . Back pain 03/09/2012  . 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 Heel pain, bilateral 04/25/2010  . Obesity 01/05/2010  . Hyperlipidemia 10/04/2009  . Essential hypertension 10/04/2009    Past Surgical History:  Procedure Laterality Date  . [place] STUDY N/A 02/28/2016   Procedure: [place] STUDY;  Surgeon: Jerene Bears, MD;  Location: [facility];  Service: Gastroenterology;  Laterality: N/A;  . ESOPHAGEAL MANOMETRY N/A 09/01/2013   Procedure: ESOPHAGEAL MANOMETRY (EM);  Surgeon: Sable Feil, MD;  Location: [facility];  Service: Endoscopy;  Laterality: N/A;  . ESOPHAGEAL MANOMETRY N/A 02/28/2016   Procedure: ESOPHAGEAL MANOMETRY (EM);  Surgeon: Jerene Bears, MD;  Location: [facility];  Service: Gastroenterology;  Laterality: N/A;  . INGUINAL HERNIA REPAIR    . TONSILLECTOMY    . UMBILICAL HERNIA REPAIR    . UVULECTOMY          Home Medications    Prior to Admission medications   Medication Sig Start Date End Date Taking? Authorizing Provider  acetaminophen (TYLENOL) 500 MG tablet  Take 1 tablet (500 mg total) by mouth every 6 (six) hours as needed. 05/24/16   [name], MD  albuterol (PROVENTIL HFA;VENTOLIN HFA) 108 (90 Base) MCG/ACT inhaler Inhale 2 puffs into the lungs every 6 (six) hours as needed for wheezing or shortness of breath. 12/06/17   Debbrah Alar, NP  amLODipine (NORVASC) 5 MG tablet Take 1 tablet by mouth once daily 01/15/19   Copland, Gay Filler, MD  aspirin EC 81 MG tablet Take 81 mg by mouth daily.    [provider]  BAYER MICROLET LANCETS lancets Test  blood sugars twice daily 10/12/17   Shelda Pal, DO  benzonatate (TESSALON) 100 MG capsule Take 2 capsules (200 mg total) by mouth 2 (two) times daily as needed for cough. 07/18/19   Little, Wenda Overland, MD  Blood Glucose Monitoring Suppl (CONTOUR NEXT MONITOR) w/Device KIT 1 kit by Does not apply route 2 (two) times daily. 03/15/17   Dennard Nip D, MD  diclofenac (VOLTAREN) 75 MG EC tablet Take 1 tablet (75 mg total) by mouth 2 (two) times daily. 06/19/19   Wallene Huh, DPM  famotidine (PEPCID) 40 MG tablet Take 1 tablet (40 mg total) by mouth daily. 10/14/18   [name], MD  fluticasone (FLONASE) 50 MCG/ACT nasal spray Place 2 sprays into both nostrils daily. 06/11/18   [name], MD  glucose blood (CONTOUR NEXT TEST) test strip Test blood sugars twice daily 10/12/17   Shelda Pal, DO  meloxicam (MOBIC) 15 MG tablet Take 1 tablet (15 mg total) by mouth daily. 07/19/19   Virgel Manifold, MD  metFORMIN (GLUCOPHAGE) 500 MG tablet Take 1 tablet (500 mg total) by mouth daily with breakfast. Patient not taking: Reported on 07/14/2019 02/03/19   [name], MD  Multiple Vitamin (MULTIVITAMIN) tablet Take 1 tablet by mouth daily.    [provider]  omeprazole (PRILOSEC) 40 MG capsule Take 1 capsule (40 mg total) by mouth every morning. 02/13/19   Pyrtle, Lajuan Lines, MD  ondansetron (ZOFRAN) 4 MG tablet Take 4 mg by mouth every 8 (eight) hours as needed for nausea or vomiting.    [provider]  pantoprazole (PROTONIX) 40 MG tablet Take 1 tablet (40 mg total) by mouth 2 (two) times daily. 02/03/19   [name], MD  rosuvastatin (CRESTOR) 10 MG tablet Take 1 tablet by mouth once daily 02/21/19   Richardo Priest, MD  tadalafil (CIALIS) 5 MG tablet Take 1 tablet (5 mg total) by mouth daily as needed for erectile dysfunction. 12/06/17   Debbrah Alar, NP  triamterene-hydrochlorothiazide [facility]) 37.5-25 MG tablet Take 1 tablet by mouth once daily  05/26/19   [name], MD  Vitamin D, Ergocalciferol, (DRISDOL) 1.25 MG (50000 UT) CAPS capsule TAKE 1 CAPSULE BY MOUTH ONCE A WEEK FOR  12  WEEKS 05/27/19   [name], MD  amitriptyline (ELAVIL) 25 MG tablet Take 1 tablet (25 mg total) by mouth at bedtime. Patient not taking: Reported on 07/14/2019 02/13/19 07/18/19  Pyrtle, Lajuan Lines, MD  hyoscyamine (LEVSIN SL) 0.125 MG SL tablet Place 1 tablet (0.125 mg total) under the tongue every 4 (four) hours as needed. Patient not taking: Reported on 07/14/2019 06/11/18 07/18/19  [name], MD    Family History Family History  Problem Relation Age of Onset  . Dementia Mother   . Diabetes Mother   . Hypertension Mother   . Heart failure Mother   . Stroke  Mother   . Obesity Mother   . Hypertension Other        siblings  . Diabetes Sister   . Cancer Brother        lung cancer smoker  . Heart attack Maternal Grandmother   . Diabetes Sister   . Pancreatic disease Sister   . Obesity Father   . Colon cancer Neg Hx     Social History Social History   Tobacco Use  . Smoking status: Never Smoker  . Smokeless tobacco: Never Used  Substance Use Topics  . Alcohol use: No  . Drug use: No     Allergies   Hydrocodone and Tramadol   Review of Systems Review of Systems  Constitutional: Positive for fatigue and fever. Negative for chills.  HENT: Negative for ear pain and sore throat.   Eyes: Negative for pain and visual disturbance.  Respiratory: Positive for cough and shortness of breath.   Cardiovascular: Negative for chest pain and palpitations.  Gastrointestinal: Positive for abdominal pain (cramping), diarrhea, nausea and vomiting.  Genitourinary: Negative for dysuria and hematuria.  Musculoskeletal: Positive for arthralgias and myalgias. Negative for back pain.  Skin: Negative for color change and rash.  Neurological: Positive for headaches. Negative for seizures and syncope.  All other systems reviewed and are negative.     Physical Exam Updated Vital Signs  ED Triage Vitals  Enc Vitals Group     BP 07/23/19 1415 (!) 153/107     Pulse Rate 07/23/19 1415 (!) 108     Resp 07/23/19 1415 20     Temp 07/23/19 1415 (!) 103.5 F (39.7 C)     Temp Source 07/23/19 1415 Oral     SpO2 07/23/19 1415 93 %     Weight 07/23/19 1417 259 lb 11.2 oz (117.8 kg)     Height 07/23/19 1417 5' 11.5" (1.816 m)     Head Circumference --      Peak Flow --      Pain Score 07/23/19 1418 10     Pain Loc --      Pain Edu? --      Excl. in [place]? --     Physical Exam Vitals signs and nursing note reviewed.  Constitutional:      General: He is in acute distress.     Appearance: He is well-developed. He is ill-appearing.  HENT:     Head: Normocephalic and atraumatic.     Nose: Nose normal.     Mouth/Throat:     Mouth: Mucous membranes are moist.  Eyes:     Extraocular Movements: Extraocular movements intact.     Conjunctiva/sclera: Conjunctivae normal.     Pupils: Pupils are equal, round, and reactive to light.  Neck:     Musculoskeletal: Normal range of motion and neck supple. No muscular tenderness.  Cardiovascular:     Rate and Rhythm: Normal rate and regular rhythm.     Heart sounds: No murmur.  Pulmonary:     Effort: No respiratory distress.     Breath sounds: No wheezing.     Comments: Tachypnea, hypoxic on room air to the 80s, coarse breath sounds throughout Abdominal:     Palpations: Abdomen is soft.     Tenderness: There is no abdominal tenderness.  Musculoskeletal: Normal range of motion.        General: No tenderness.  Skin:    General: Skin is warm and dry.  Neurological:     General: No focal deficit present.  Mental Status: He is alert.  Psychiatric:        Mood and Affect: Mood normal.      ED Treatments / Results  Labs (all labs ordered are listed, but only abnormal results are displayed) Labs Reviewed  CBC - Abnormal; Notable for the following components:      Result Value   WBC 3.9  (*)    RBC 6.20 (*)    Hemoglobin 17.5 (*)    HCT 53.4 (*)    Platelets 149 (*)    All other components within normal limits  URINALYSIS, ROUTINE W REFLEX MICROSCOPIC - Abnormal; Notable for the following components:   Specific Gravity, Urine >1.030 (*)    Hgb urine dipstick MODERATE (*)    Ketones, ur 15 (*)    Protein, ur >300 (*)    All other components within normal limits  LACTIC ACID, PLASMA - Abnormal; Notable for the following components:   Lactic Acid, Venous 2.2 (*)    All other components within normal limits  D-DIMER, QUANTITATIVE (NOT AT [facility]) - Abnormal; Notable for the following components:   D-Dimer, Quant 0.87 (*)    All other components within normal limits  COMPREHENSIVE METABOLIC PANEL - Abnormal; Notable for the following components:   Sodium 133 (*)    Glucose, Bld 108 (*)    Calcium 8.2 (*)    ALT 46 (*)    All other components within normal limits  URINALYSIS, MICROSCOPIC (REFLEX) - Abnormal; Notable for the following components:   Bacteria, UA FEW (*)    All other components within normal limits  CULTURE, BLOOD (ROUTINE X 2)  CULTURE, BLOOD (ROUTINE X 2)  URINE CULTURE  LIPASE, BLOOD  LACTIC ACID, PLASMA  FIBRINOGEN  PROCALCITONIN  C-REACTIVE PROTEIN  FERRITIN  LACTATE DEHYDROGENASE  TRIGLYCERIDES    EKG EKG Interpretation  Date/Time:  Wednesday July 23 2019 14:59:14 EST Ventricular Rate:  103 PR Interval:    QRS Duration: 81 QT Interval:  306 QTC Calculation: 401 R Axis:   48 Text Interpretation: Sinus tachycardia Borderline ST elevation, anterior leads Confirmed by Lennice Sites [phone]) on 07/23/2019 3:06:07 PM   Radiology Dg Chest Portable 1 View  Result Date: 07/23/2019 CLINICAL DATA:  Shortness of breath, vomiting, and diarrhea. COVID-19. Fever. Chest pain and abdominal pain. Headache. EXAM: PORTABLE CHEST 1 VIEW COMPARISON:  07/18/2019 and 11/26/2018 FINDINGS: There is a new faint infiltrate at the right lung base as well as  a new small patchy area of infiltrate and atelectasis in the left mid and lower lung zones. Heart size and pulmonary vascularity are normal. No discrete effusions. No acute bone abnormality. IMPRESSION: 1. New small areas of infiltrate in both lungs. 2. No other significant abnormalities. Electronically Signed   By: Lorriane Shire M.D.   On: 07/23/2019 15:15    Procedures .Critical Care Performed by: Lennice Sites, DO Authorized by: Lennice Sites, DO   Critical care provider statement:    Critical care time (minutes):  45   Critical care was necessary to treat or prevent imminent or life-threatening deterioration of the following conditions:  Respiratory failure   Critical care was time spent personally by me on the following activities:  Blood draw for specimens, development of treatment plan with patient or surrogate, discussions with primary provider, evaluation of patient's response to treatment, examination of patient, obtaining history from patient or surrogate, ordering and performing treatments and interventions, ordering and review of laboratory studies, pulse oximetry, ordering and review of radiographic studies,  re-evaluation of patient's condition and review of old charts   I assumed direction of critical care for this patient from another provider in my specialty: no     (including critical care time)  Medications Ordered in ED Medications  ondansetron (ZOFRAN) injection 4 mg (4 mg Intravenous Given 07/23/19 1526)  sodium chloride 0.9 % bolus 1,000 mL ( Intravenous Stopped 07/23/19 1535)  acetaminophen (TYLENOL) tablet 1,000 mg (1,000 mg Oral Given 07/23/19 1529)  methylPREDNISolone sodium succinate (SOLU-MEDROL) 125 mg/2 mL injection 80 mg (80 mg Intravenous Given 07/23/19 1659)     Initial Impression / Assessment and Plan / ED Course  I have reviewed the triage vital signs and the nursing notes.  Pertinent labs & imaging results that were available during my care of the  patient were reviewed by me and considered in my medical decision making (see chart for details).       Ayron Fillinger Langner is a 53 year old male with history of hypertension, high cholesterol who presents the ED with viral symptoms.  Positive for coronavirus several days ago.  Having nausea, vomiting, diarrhea today.  Having worsening cough and shortness of breath.  Hypoxic upon arrival to the upper 80s which improved on 2 L of oxygen.  Patient tachypneic in the 30s.  Febrile and mildly tachycardic.  Infectious work-up was initiated but suspect that this is all secondary to coronavirus.  Chest x-ray showed some infiltrates bilaterally.  Patient had leukopenia.  Otherwise no significant electrolyte abnormality, kidney injury.  Inflammatory markers elevated including D-dimer, lactic acid.  Patient was given 1 L normal saline, IV Zofran.  Was given Tylenol.  EKG shows sinus rhythm.  Hospitalist to admit patient over at [facility].  Will give a dose of IV Solu-Medrol per their recommendations.  Stable throughout my care.  [place] Corr[place]jo was evaluated in Emergency Department on 07/23/2019 for the symptoms described in the history of present illness. He was evaluated in the context of the global COVID-19 pandemic, which necessitated consideration that the patient might be at risk for infection with the SARS-CoV-2 virus that causes COVID-19. Institutional protocols and algorithms that pertain to the evaluation of patients at risk for COVID-19 are in a state of rapid change based on information released by regulatory bodies including the CDC and federal and state organizations. These policies and algorithms were followed during the patient's care in the ED.  This chart was dictated using voice recognition software.  Despite best efforts to proofread,  errors can occur which can change the documentation meaning.    Final Clinical Impressions(s) / ED Diagnoses   Final diagnoses:  Acute respiratory failure with  hypoxia [facility])  COVID-19    ED Discharge Orders    None       Lennice Sites, DO 07/23/19 1705

## 2019-07-23 NOTE — Telephone Encounter (Signed)
Patient's wife calling to report that the patient was notified of positive COVID-19 test on Monday,07/21/19. Since that time pt's wife states that the pt has been running a fever that does not seem to come down and has not been able to keep anything down.Most recent temperature was 102.75 and was taken 20 min prior to speaking with triage nurse.Patient had 4 episodes of emesis today. Pt is not able to keep anything down at this time and is not able to take tylenol. Pt's wife states that the patient was taken to [facility] on Monday had to wait for 7 hours in the waiting room. Call transferred to [facility] in the office to see if appt was available with provider today. Pt's wife advised to take the pt to the ED(and to notify them that the patient was positive for COVID-19) if pt becomes worse before scheduled appt. Pt verbalized understanding.   Reason for Disposition . [1] Fever > 100.0 F (37.8 C) AND [2] weak immune system (e.g., HIV positive, cancer chemo, splenectomy, organ transplant, chronic steroids)  Answer Assessment - Initial Assessment Questions 1. TEMPERATURE: "What is the most recent temperature?"  "How was it measured?"      102.75 2. ONSET: "When did the fever start?"      Has been occurring since Monday 3. SYMPTOMS: "Do you have any other symptoms besides the fever?"  (e.g., colds, headache, sore throat, earache, cough, rash, diarrhea, vomiting, abdominal pain)     Vomiting and body aches 4. CAUSE: If there are no symptoms, ask: "What do you think is causing the fever?"      +COVID-19 5. CONTACTS: "Does anyone else in the family have an infection?"     no 6. TREATMENT: "What have you done so far to treat this fever?" (e.g., medications)     Has tried to treat with Tylenol but pt is unable to keep anything down  Answer Assessment - Initial Assessment Questions 1. VOMITING SEVERITY: "How many times have you vomited in the past 24 hours?"     - MILD:  1 - 2 times/day    -  MODERATE: 3 - 5 times/day, decreased oral intake without significant weight loss or symptoms of dehydration    - SEVERE: 6 or more times/day, vomits everything or nearly everything, with significant weight loss, symptoms of dehydration      4 times today 2. ONSET: "When did the vomiting begin?"      Today and yesterday 3. FLUIDS: "What fluids or food have you vomited up today?" "Have you been able to keep any fluids down?"     Unable to keep anything down 4. ABDOMINAL PAIN: "Are your having any abdominal pain?" If yes : "How bad is it and what does it feel like?" (e.g., crampy, dull, intermittent, constant)      No c/o abdominal pain voiced 5. DIARRHEA: "Is there any diarrhea?" If so, ask: "How many times today?"      No c/o of diarrhea voiced 6. CONTACTS: "Is there anyone else in the family with the same symptoms?"      Recently tested positive for COVID-19 7. CAUSE: "What do you think is causing your vomiting?"     +COVID-19 8. OTHER SYMPTOMS: "Do you have any other symptoms?" (e.g., fever, headache, vertigo, vomiting blood or coffee grounds, recent head injury)     Body aches  Protocols used: VOMITING-A-AH, FEVER-A-AH

## 2019-07-23 NOTE — ED Notes (Signed)
[facility] advised several hours before transport to [facility]. Pt informed.

## 2019-07-23 NOTE — ED Notes (Signed)
Update given to patients wife about his room at [facility].

## 2019-07-24 DIAGNOSIS — I10 Essential (primary) hypertension: Secondary | ICD-10-CM

## 2019-07-24 DIAGNOSIS — E119 Type 2 diabetes mellitus without complications: Secondary | ICD-10-CM

## 2019-07-24 DIAGNOSIS — E785 Hyperlipidemia, unspecified: Secondary | ICD-10-CM

## 2019-07-24 DIAGNOSIS — J9601 Acute respiratory failure with hypoxia: Secondary | ICD-10-CM | POA: Diagnosis present

## 2019-07-24 DIAGNOSIS — J1289 Other viral pneumonia: Secondary | ICD-10-CM

## 2019-07-24 DIAGNOSIS — U071 COVID-19: Principal | ICD-10-CM

## 2019-07-24 LAB — TYPE AND SCREEN
ABO/RH(D): B POS
Antibody Screen: POSITIVE

## 2019-07-24 LAB — C-REACTIVE PROTEIN: CRP: 5.3 mg/dL — ABNORMAL HIGH (ref <1.0)

## 2019-07-24 LAB — GLUCOSE, CAPILLARY
Glucose-Capillary: 113 mg/dL — ABNORMAL HIGH (ref 70–99)
Glucose-Capillary: 162 mg/dL — ABNORMAL HIGH (ref 70–99)
Glucose-Capillary: 149 mg/dL — ABNORMAL HIGH (ref 70–99)

## 2019-07-24 LAB — HEMOGLOBIN A1C
Hgb A1c MFr Bld: 6.3 % — ABNORMAL HIGH (ref 4.8–5.6)
Mean Plasma Glucose: 134.11 mg/dL

## 2019-07-24 LAB — D-DIMER, QUANTITATIVE: D-Dimer, Quant: 0.72 ug/mL-FEU — ABNORMAL HIGH (ref 0.00–0.50)

## 2019-07-24 LAB — HIV ANTIBODY (ROUTINE TESTING W REFLEX): HIV Screen 4th Generation wRfx: NONREACTIVE

## 2019-07-24 MED ORDER — ORAL CARE MOUTH RINSE
15.0000 mL | Freq: Two times a day (BID) | OROMUCOSAL | Status: DC
  Administered 2019-07-24 – 2019-07-28 (×5): 15 mL via OROMUCOSAL

## 2019-07-24 MED ORDER — SODIUM CHLORIDE 0.9 % IV SOLN
INTRAVENOUS | Status: DC | PRN
  Administered 2019-07-24: 1000 mL via INTRAVENOUS

## 2019-07-24 MED ORDER — BENZONATATE 100 MG PO CAPS
200.0000 mg | ORAL_CAPSULE | Freq: Two times a day (BID) | ORAL | Status: DC | PRN
  Administered 2019-07-25 – 2019-07-26 (×2): 200 mg via ORAL
  Filled 2019-07-24 (×2): qty 2

## 2019-07-24 MED ORDER — DEXAMETHASONE 6 MG PO TABS
6.0000 mg | ORAL_TABLET | ORAL | Status: DC
  Administered 2019-07-24 – 2019-07-25 (×2): 6 mg via ORAL
  Filled 2019-07-24 (×2): qty 1

## 2019-07-24 MED ORDER — ALBUTEROL SULFATE HFA 108 (90 BASE) MCG/ACT IN AERS
2.0000 | INHALATION_SPRAY | Freq: Four times a day (QID) | RESPIRATORY_TRACT | Status: DC
  Administered 2019-07-24 – 2019-07-28 (×14): 2 via RESPIRATORY_TRACT
  Filled 2019-07-24: qty 6.7

## 2019-07-24 MED ORDER — INSULIN ASPART 100 UNIT/ML ~~LOC~~ SOLN: 0.0000 [IU] | Freq: Three times a day (TID) | SUBCUTANEOUS | Status: DC

## 2019-07-24 MED ORDER — ROSUVASTATIN CALCIUM 5 MG PO TABS
10.0000 mg | ORAL_TABLET | Freq: Every day | ORAL | Status: DC
  Administered 2019-07-24 – 2019-07-27 (×4): 10 mg via ORAL
  Filled 2019-07-24 (×4): qty 2

## 2019-07-24 MED ORDER — AMLODIPINE BESYLATE 5 MG PO TABS: 5.0000 mg | ORAL_TABLET | Freq: Every day | ORAL | Status: DC

## 2019-07-24 MED ORDER — AMLODIPINE BESYLATE 5 MG PO TABS
5.0000 mg | ORAL_TABLET | Freq: Once | ORAL | Status: AC
  Administered 2019-07-24: 5 mg via ORAL
  Filled 2019-07-24: qty 1

## 2019-07-24 MED ORDER — GUAIFENESIN-DM 100-10 MG/5ML PO SYRP
10.0000 mL | ORAL_SOLUTION | ORAL | Status: DC | PRN
  Administered 2019-07-24: 10 mL via ORAL
  Filled 2019-07-24: qty 10

## 2019-07-24 MED ORDER — AMLODIPINE BESYLATE 5 MG PO TABS
10.0000 mg | ORAL_TABLET | Freq: Every day | ORAL | Status: DC
  Administered 2019-07-25 – 2019-07-26 (×2): 10 mg via ORAL
  Filled 2019-07-24 (×2): qty 2

## 2019-07-24 MED ORDER — INSULIN ASPART 100 UNIT/ML ~~LOC~~ SOLN: 0.0000 [IU] | Freq: Every day | SUBCUTANEOUS | Status: DC

## 2019-07-24 MED ORDER — ALBUTEROL SULFATE HFA 108 (90 BASE) MCG/ACT IN AERS
2.0000 | INHALATION_SPRAY | Freq: Four times a day (QID) | RESPIRATORY_TRACT | Status: DC | PRN
  Filled 2019-07-24: qty 6.7

## 2019-07-24 MED ORDER — TRIAMTERENE-HCTZ 37.5-25 MG PO TABS
1.0000 | ORAL_TABLET | Freq: Every day | ORAL | Status: DC
  Administered 2019-07-24 – 2019-07-28 (×5): 1 via ORAL
  Filled 2019-07-24 (×6): qty 1

## 2019-07-24 MED ORDER — PANTOPRAZOLE SODIUM 40 MG PO TBEC
80.0000 mg | DELAYED_RELEASE_TABLET | Freq: Two times a day (BID) | ORAL | Status: DC
  Administered 2019-07-24 – 2019-07-28 (×9): 80 mg via ORAL
  Filled 2019-07-24 (×9): qty 2

## 2019-07-24 MED ORDER — ADULT MULTIVITAMIN W/MINERALS CH
1.0000 | ORAL_TABLET | Freq: Every day | ORAL | Status: DC
  Administered 2019-07-24 – 2019-07-28 (×5): 1 via ORAL
  Filled 2019-07-24 (×6): qty 1

## 2019-07-24 MED ORDER — PANTOPRAZOLE SODIUM 40 MG PO TBEC: 40.0000 mg | DELAYED_RELEASE_TABLET | Freq: Two times a day (BID) | ORAL | Status: DC

## 2019-07-24 MED ORDER — ENOXAPARIN SODIUM 60 MG/0.6ML ~~LOC~~ SOLN
60.0000 mg | Freq: Every day | SUBCUTANEOUS | Status: DC
  Administered 2019-07-24 – 2019-07-26 (×3): 60 mg via SUBCUTANEOUS
  Filled 2019-07-24 (×4): qty 0.6

## 2019-07-24 MED ORDER — SODIUM CHLORIDE 0.9 % IV SOLN
100.0000 mg | INTRAVENOUS | Status: AC
  Administered 2019-07-25 – 2019-07-28 (×4): 100 mg via INTRAVENOUS
  Filled 2019-07-24: qty 100
  Filled 2019-07-24: qty 20
  Filled 2019-07-24: qty 100
  Filled 2019-07-24: qty 20

## 2019-07-24 MED ORDER — ONDANSETRON HCL 4 MG/2ML IJ SOLN: 4.0000 mg | Freq: Four times a day (QID) | INTRAMUSCULAR | Status: DC | PRN

## 2019-07-24 MED ORDER — INSULIN ASPART 100 UNIT/ML ~~LOC~~ SOLN
0.0000 [IU] | Freq: Three times a day (TID) | SUBCUTANEOUS | Status: DC
  Administered 2019-07-24: 2 [IU] via SUBCUTANEOUS
  Administered 2019-07-24 – 2019-07-25 (×2): 1 [IU] via SUBCUTANEOUS

## 2019-07-24 MED ORDER — ACETAMINOPHEN 325 MG PO TABS
650.0000 mg | ORAL_TABLET | Freq: Four times a day (QID) | ORAL | Status: DC | PRN
  Administered 2019-07-24 – 2019-07-26 (×5): 650 mg via ORAL
  Filled 2019-07-24 (×5): qty 2

## 2019-07-24 MED ORDER — ASPIRIN EC 81 MG PO TBEC
81.0000 mg | DELAYED_RELEASE_TABLET | Freq: Every day | ORAL | Status: DC
  Administered 2019-07-24 – 2019-07-28 (×5): 81 mg via ORAL
  Filled 2019-07-24 (×5): qty 1

## 2019-07-24 MED ORDER — SODIUM CHLORIDE 0.9 % IV SOLN
200.0000 mg | Freq: Once | INTRAVENOUS | Status: AC
  Administered 2019-07-24: 200 mg via INTRAVENOUS
  Filled 2019-07-24: qty 40

## 2019-07-24 MED ORDER — ONDANSETRON HCL 4 MG PO TABS: 4.0000 mg | ORAL_TABLET | Freq: Four times a day (QID) | ORAL | Status: DC | PRN

## 2019-07-24 MED ORDER — HYDRALAZINE HCL 50 MG PO TABS
50.0000 mg | ORAL_TABLET | Freq: Three times a day (TID) | ORAL | Status: DC
  Administered 2019-07-24 – 2019-07-26 (×8): 50 mg via ORAL
  Filled 2019-07-24 (×8): qty 1

## 2019-07-24 NOTE — Progress Notes (Signed)
PROGRESS NOTE                                                                                                                                                                                                             Patient Demographics:    [name] Foster, is a 53 y.o. male, DOB - [birth year], [id number]  Outpatient Primary MD for the patient is [name], MD    LOS - 1  Admit date - 07/23/2019    Chief Complaint  Patient presents with  . SOB HA Covid VD, CP       Brief Narrative  - [name] Foster is a 53 y.o. male with medical history significant of HTN, DM2 (diet controlled), PUD, GERD.  Patient diagnosed with COVID 11/6. Patient presents to the ED with progressively worsening cough, fatigue, fever, SOB.  Not helped by OTC meds at home.  Associated headache, muscle aches.  Symptoms constant & worsening. Had Covid PNA with hypoxia in the ER.   Subjective:    [name] Foster today has, No headache, No chest pain, No abdominal pain - No Nausea, No new weakness tingling or numbness, improved Cough - SOB.     Assessment  & Plan :     1. Acute Hypoxic Resp. Failure due to Acute Covid 19 Viral Pneumonitis during the ongoing 2020 Covid 19 Pandemic - has been started on IV Remdesivir and Steroids, monitor, has consented for Con.Plasma if needed. Currently stable on 2 lits o2.    COVID-19 Labs  Recent Labs    07/23/19 1520 07/23/19 1615 07/24/19 0800  DDIMER 0.87*  --  0.72*  FERRITIN  --  2,038*  --   LDH  --  212*  --   CRP  --  7.4* 5.3*    Lab Results  Component Value Date   SARSCOV2NAA DETECTED (A) 07/18/2019   [place] Not Detected 07/11/2019   [place] Not Detected 02/10/2019     SpO2: 95 % O2 Flow Rate (L/min): 2 L/min  Hepatic Function Latest Ref Rng & Units 07/23/2019 07/18/2019 10/14/2018  Total Protein 6.5 - 8.1 g/dL 7.4 7.6 7.7  Albumin 3.5 - 5.0 g/dL 3.5 4.3 4.7  AST 15 - 41 U/L 35 24  25  ALT 0 - 44 U/L 46(H) 42 41  Alk Phosphatase 38 - 126 U/L 59 72  73  Total Bilirubin 0.3 - 1.2 mg/dL 0.4 0.6 0.5  Bilirubin, Direct 0.0 - 0.3 mg/dL - - -     No results found for: BNP    2.  HTN - placed on Norvasc increased dose and added Hydralazine.  3.  H/O TB - treated decades ago.  4. GERD - PPI  5. Dyslipidemia - on statin.  6. DM2 - good outpt control ISS and monitor.  Lab Results  Component Value Date   HGBA1C 6.3 (H) 07/24/2019   CBG (last 3)  Recent Labs    07/24/19 0919  GLUCAP 113*      Condition - Fair  Family Communication  :  Wife 07/24/19 - 10.20 am > 20 rings on her cell - [phone] x 2  Code Status :  Full  Diet :   Diet Order            Diet Carb Modified Fluid consistency: Thin; Room service appropriate? Yes  Diet effective now               Disposition Plan  :  Home  Consults  :  None  Procedures  :     PUD Prophylaxis : PPI  DVT Prophylaxis  :  Lovenox    Lab Results  Component Value Date   PLT 149 (L) 07/23/2019    Inpatient Medications  Scheduled Meds: . albuterol  2 puff Inhalation Q6H  . amLODipine  5 mg Oral Daily  . aspirin EC  81 mg Oral Daily  . dexamethasone  6 mg Oral Q24H  . enoxaparin (LOVENOX) injection  60 mg Subcutaneous Daily  . insulin aspart  0-15 Units Subcutaneous TID WC  . mouth rinse  15 mL Mouth Rinse BID  . multivitamin with minerals  1 tablet Oral Daily  . pantoprazole  80 mg Oral BID  . rosuvastatin  10 mg Oral q1800  . triamterene-hydrochlorothiazide  1 tablet Oral Daily   Continuous Infusions: . sodium chloride 1,000 mL (07/24/19 0811)  . [START ON 07/25/2019] remdesivir 100 mg in NS 250 mL     PRN Meds:.sodium chloride, acetaminophen, albuterol, benzonatate, guaiFENesin-dextromethorphan, ondansetron **OR** ondansetron (ZOFRAN) IV  Antibiotics  :    Anti-infectives (From admission, onward)   Start     Dose/Rate Route Frequency Ordered Stop   07/25/19 1000  remdesivir 100 mg  in sodium chloride 0.9 % 250 mL IVPB     100 mg 500 mL/hr over 30 Minutes Intravenous Every 24 hours 07/24/19 0627 07/29/19 0959   07/24/19 0730  remdesivir 200 mg in sodium chloride 0.9 % 250 mL IVPB     200 mg 500 mL/hr over 30 Minutes Intravenous Once 07/24/19 0321 07/24/19 0844       Time Spent in minutes  30   Lala Lund M.D on 07/24/2019 at 10:13 AM  To page go to [email or web address] - password [place]  [facility] -  Office  [phone]    See all Orders from today for further details    Objective:   Vitals:   07/24/19 0423 07/24/19 0500 07/24/19 0509 07/24/19 0806  BP: (!) 155/101 (!) 147/90 (!) 147/90 (!) 140/96  Pulse: 67 62 (!) 58 (!) 55  Resp: (!) 22 (!) 21  15  Temp:  98.3 F (36.8 C)  (!) 96.4 F (35.8 C)  TempSrc:  Oral  Oral  SpO2: 96% 98% 97% 95%  Weight:  118.5 kg    Height:  5' 11.5" (1.816 m)  Wt Readings from Last 3 Encounters:  07/24/19 118.5 kg  07/19/19 118 kg  07/18/19 117.9 kg     Intake/Output Summary (Last 24 hours) at 07/24/2019 1013 Last data filed at 07/24/2019 0701 Gross per 24 hour  Intake 128.04 ml  Output 1300 ml  Net -1171.96 ml     Physical Exam  Awake Alert, Oriented X 3, No new F.N deficits, Normal affect [place].AT,PERRAL Supple Neck,No JVD, No cervical lymphadenopathy appriciated.  Symmetrical Chest wall movement, Good air movement bilaterally, CTAB RRR,No Gallops,Rubs or new Murmurs, No Parasternal Heave +ve B.Sounds, Abd Soft, No tenderness, No organomegaly appriciated, No rebound - guarding or rigidity. No Cyanosis, Clubbing or edema, No new Rash or bruise       Data Review:    CBC Recent Labs  Lab 07/18/19 1007 07/23/19 1520  WBC 6.5 3.9*  HGB 16.4 17.5*  HCT 50.4 53.4*  PLT 191 149*  MCV 87.8 86.1  MCH 28.6 28.2  MCHC 32.5 32.8  RDW 14.3 14.4  LYMPHSABS 1.2  --   MONOABS 0.8  --   EOSABS 0.0  --   BASOSABS 0.0  --     Chemistries  Recent Labs  Lab 07/18/19 1007 07/23/19 1600  NA  136 133*  K 3.8 3.8  CL 102 99  CO2 26 24  GLUCOSE 89 108*  BUN 16 13  CREATININE 0.95 1.17  CALCIUM 9.1 8.2*  AST 24 35  ALT 42 46*  ALKPHOS 72 59  BILITOT 0.6 0.4   ------------------------------------------------------------------------------------------------------------------ Recent Labs    07/23/19 1615  TRIG 132    Lab Results  Component Value Date   HGBA1C 6.3 (H) 07/24/2019   ------------------------------------------------------------------------------------------------------------------ No results for input(s): TSH, T4TOTAL, T3FREE, THYROIDAB in the last 72 hours.  Invalid input(s): FREET3  Cardiac Enzymes No results for input(s): CKMB, TROPONINI, MYOGLOBIN in the last 168 hours.  Invalid input(s): CK ------------------------------------------------------------------------------------------------------------------ No results found for: BNP  Micro Results Recent Results (from the past 240 hour(s))  Novel Coronavirus, NAA (Hosp order, Send-out to Ref Lab; TAT 18-24 hrs     Status: Abnormal   Collection Time: 07/18/19 10:07 AM   Specimen: Nasopharyngeal Swab; Respiratory  Result Value Ref Range Status   SARS-CoV-2, NAA DETECTED (A) NOT DETECTED Final    Comment: CRITICAL RESULT CALLED TO, READ BACK BY AND VERIFIED WITH:  LORI Moran [id number] FCP (NOTE)                  Client Requested Flag This nucleic acid amplification test was developed and its performance characteristics determined by [employer]. Nucleic acid amplification tests include PCR and TMA. This test has not been FDA cleared or approved. This test has been authorized by FDA under an Emergency Use Authorization (EUA). This test is only authorized for the duration of time the declaration that circumstances exist justifying the authorization of the emergency use of in vitro diagnostic tests for detection of SARS-CoV-2 virus and/or diagnosis of COVID-19 infection under section  564(b)(1) of the Act, 21 U.S.C. 403KVQ-2(V) (1), unless the authorization is terminated or revoked sooner. When diagnostic testing is negative, the possibility of a false negative result should be considered in the context of a patient's recent exposures and the presence of clinica l signs and symptoms consistent with COVID-19. An individual without symptoms of COVID- 19 and who is not shedding SARS-CoV-2 virus would expect to have a negative (not detected) result in this assay. Performed At: [facility] [address]  [place], [place] [id number] Rush Farmer MD AQ:[id number] Performed at [facility], [place] [address], [place], [place] [address]    Coronavirus Source NASAL SWAB  Corrected    Comment: Performed at [facility], [address] [place], [place] [address] CORRECTED ON 11/06 AT 1023: PREVIOUSLY REPORTED AS NASOPHARYNGEAL   Blood Culture (routine x 2)     Status: None (Preliminary result)   Collection Time: 07/23/19  3:45 PM   Specimen: BLOOD  Result Value Ref Range Status   Specimen Description   Final    BLOOD BLOOD Performed at [facility], [address] [place], [place] [address]    Special Requests   Final    BOTTLES DRAWN AEROBIC AND ANAEROBIC Blood Culture adequate volume Performed at [facility], [address] [place], [place] [address]    Culture   Final    NO GROWTH < 12 HOURS Performed at [facility], [place] [address], [place], [place] [address]    Report Status PENDING  Incomplete    Radiology Reports Dg Chest Portable 1 View  Result Date: 07/23/2019 CLINICAL DATA:  Shortness of breath, vomiting, and diarrhea. COVID-19. Fever. Chest pain and abdominal pain. Headache. EXAM: PORTABLE CHEST 1 VIEW COMPARISON:  07/18/2019 and 11/26/2018 FINDINGS: There is a new faint infiltrate at the right lung base as well as a new small patchy area of infiltrate and atelectasis in the left mid and lower lung  zones. Heart size and pulmonary vascularity are normal. No discrete effusions. No acute bone abnormality. IMPRESSION: 1. New small areas of infiltrate in both lungs. 2. No other significant abnormalities. Electronically Signed   By: Lorriane Shire M.D.   On: 07/23/2019 15:15   Dg Chest Port 1 View  Result Date: 07/18/2019 CLINICAL DATA:  Chest pain and shortness of breath. EXAM: PORTABLE CHEST 1 VIEW COMPARISON:  November 26, 2018 FINDINGS: The lungs are clear. Heart is mildly enlarged with pulmonary vascularity normal. No adenopathy. No bone lesions. IMPRESSION: Mild cardiomegaly.  No edema or consolidation. Electronically Signed   By: Lowella Grip III M.D.   On: 07/18/2019 11:07

## 2019-07-24 NOTE — Evaluation (Signed)
Physical Therapy Evaluation Patient Details Name: [name] Foster MRN: [id number] DOB: Jun 12, 1966 Today's Date: 07/24/2019   History of Present Illness  53 y/o male w/ hx of DM, OSA, murmur, HLD, HTN, GERD, diverticulosis, chest pain and recent dx of COVID. presented w/ c/o cough, gen body aches,  Clinical Impression   Pt admitted with above diagnosis. PTA was independent living home with spouse. Was dx with COVID and dc home but came back and was admitted for above. Pt currently with functional limitations due to the deficits listed below (see PT Problem List). Pt presnted with decrease activity tolerance increased pain and decreased independence with all functional mobility. He is currently on 2L/min and was able to maintain sats in 90s throughout assessment and tx. Pt will benefit from skilled PT to increase their independence and safety with mobility to allow discharge to the venue listed below.       Follow Up Recommendations No PT follow up    Equipment Recommendations  None recommended by PT    Recommendations for Other Services OT consult     Precautions / Restrictions Precautions Precautions: Fall Restrictions Weight Bearing Restrictions: No      Mobility  Bed Mobility Overal bed mobility: Needs Assistance Bed Mobility: Supine to Sit     Supine to sit: Supervision     General bed mobility comments: line management/set up and cues  Transfers Overall transfer level: Needs assistance Equipment used: None Transfers: Sit to/from [employer] Transfers Sit to Stand: Supervision Stand pivot transfers: Supervision          Ambulation/Gait Ambulation/Gait assistance: Supervision Gait Distance (Feet): 48 Feet Assistive device: None Gait Pattern/deviations: Step-through pattern     General Gait Details: able to ambulate in room pt on 2L/min via  and maintains sats in 90s  Stairs            Wheelchair Mobility    Modified Rankin (Stroke Patients  Only)       Balance Overall balance assessment: Needs assistance Sitting-balance support: Feet unsupported Sitting balance-Leahy Scale: Good   Postural control: (none noted) Standing balance support: During functional activity Standing balance-Leahy Scale: Fair                               Pertinent Vitals/Pain Pain Assessment: 0-10 Pain Score: 6  Pain Location: not currently but has been having pain in back corresponding to level of chest when coughing, states has been having pain here. unable to fully qualify but states it feels like an internal pain not somewhere he can touch. Pain Descriptors / Indicators: Aching;Burning Pain Intervention(s): Limited activity within patient's tolerance    Home Living Family/patient expects to be discharged to:: Private residence Living Arrangements: Spouse/significant other Available Help at Discharge: Family Type of Home: House Home Access: Level entry     Home Layout: One level Home Equipment: None      Prior Function Level of Independence: Independent               Hand Dominance   Dominant Hand: Right    Extremity/Trunk Assessment   Upper Extremity Assessment Upper Extremity Assessment: Generalized weakness    Lower Extremity Assessment Lower Extremity Assessment: Generalized weakness    Cervical / Trunk Assessment Cervical / Trunk Assessment: Normal  Communication   Communication: No difficulties  Cognition Arousal/Alertness: Awake/alert Behavior During Therapy: WFL for tasks assessed/performed Overall Cognitive Status: Within Functional Limits for tasks assessed  General Comments General comments (skin integrity, edema, etc.): Pt fatigues easily but sats remain in 90s with all activity this am, worked on ambulation in room, exercises with flutter valve and incentive spirometer, sit<>stand from recliner.    Exercises Other  Exercises Other Exercises: initiated incentive spirometer use, able to pull 1255ml Other Exercises: inititaed flutter valve use, w/ cues does well with this Other Exercises: initiated sit<>stand exercises, completed x 5 in succession   Assessment/Plan    PT Assessment Patient needs continued PT services  PT Problem List Decreased strength;Decreased activity tolerance;Decreased balance;Decreased coordination;Decreased mobility;Decreased safety awareness       PT Treatment Interventions Gait training;Functional mobility training;Therapeutic exercise;Neuromuscular re-education;Patient/family education;Balance training;Therapeutic activities    PT Goals (Current goals can be found in the Care Plan section)  Acute Rehab PT Goals Patient Stated Goal: go home w/ family PT Goal Formulation: With patient Time For Goal Achievement: 08/07/19 Potential to Achieve Goals: Good    Frequency Min 3X/week   Barriers to discharge        Co-evaluation               AM-PAC PT "6 Clicks" Mobility  Outcome Measure Help needed turning from your back to your side while in a flat bed without using bedrails?: A Little Help needed moving from lying on your back to sitting on the side of a flat bed without using bedrails?: A Little Help needed moving to and from a bed to a chair (including a wheelchair)?: A Little Help needed standing up from a chair using your arms (e.g., wheelchair or bedside chair)?: A Little Help needed to walk in hospital room?: A Little Help needed climbing 3-5 steps with a railing? : A Little 6 Click Score: 18    End of Session Equipment Utilized During Treatment: Oxygen Activity Tolerance: Treatment limited secondary to medical complications (Comment);Patient tolerated treatment well Patient left: in chair;with call bell/phone within reach Nurse Communication: Mobility status PT Visit Diagnosis: Other abnormalities of gait and mobility (R26.89);Muscle weakness  (generalized) (M62.81)    Time: [id number] PT Time Calculation (min) (ACUTE ONLY): 29 min   Charges:   PT Evaluation $PT Eval Moderate Complexity: 1 Mod PT Treatments $Therapeutic Activity: 8-22 mins        Horald Chestnut, PT   Delford Field 07/24/2019, 12:38 PM

## 2019-07-24 NOTE — ED Notes (Signed)
Large loose stool.

## 2019-07-24 NOTE — Progress Notes (Signed)
Pharmacy Note  [name] Foster is a 53 y.o. male admitted on 07/23/2019 with COVID 19.  Pharmacy has been consulted for Remdesivir dosing.  CXR shows New small areas of infiltrate in both lungs. Pt requiring supplemental oxygen (2L [place]) ALT 46  Plan: Remdesivir 200mg  IV now then 100mg  IV daily x 4 days Will f/u ALT and pt's clinical condition   Height: 5' 11.5" (181.6 cm) Weight: 261 lb 3.9 oz (118.5 kg) IBW/kg (Calculated) : 76.45  Temp (24hrs), Avg:100.3 F (37.9 C), Min:98.2 F (36.8 C), Max:103.5 F (39.7 C)  Recent Labs  Lab 07/18/19 1007 07/23/19 1520 07/23/19 1600 07/23/19 1730  WBC 6.5 3.9*  --   --   CREATININE 0.95  --  1.17  --   LATICACIDVEN  --  2.2*  --  1.2    Estimated Creatinine Clearance: 98.6 mL/min (by C-G formula based on SCr of 1.17 mg/dL).    Allergies  Allergen Reactions  . Hydrocodone Itching  . Tramadol Itching    Antimicrobials this admission: 11/12 Remdesivier >> 11/16  Microbiology results: 11/11 BCx:  11/11 UCx:    Thank you for allowing pharmacy to be a part of this patient's care.  Sherlon Handing, PharmD, BCPS CGV Clinical pharmacist phone [phone] 07/24/2019 6:21 AM

## 2019-07-24 NOTE — H&P (Signed)
History and Physical    Evart Mcdonnell Deuser TMH:[id number] DOB: [birth year] DOA: 07/23/2019  PCP: [name], MD  Patient coming from: Home  I have personally briefly reviewed patient's old medical records in [place]  Chief Complaint: COVID, SOB  HPI: [name] Foster is a 53 y.o. male with medical history significant of HTN, DM2 (diet controlled), PUD, GERD.  Patient diagnosed with COVID 11/6.  Patient presents to the ED with progressively worsening cough, fatigue, fever, SOB.  Not helped by OTC meds at home.  Associated headache, muscle aches.  Symptoms constant, worsening.   ED Course: Tm. 103.5, satting upper 80s on RA, improved on O2 via [place].  CXR shows B small areas of infiltrate.  D.Dimer 0.87, CRP 7.4, procalcitonin is <0.10, Lactate 2.2, 1.2 on repeat.    WBC 3.9k.  Review of Systems: As per HPI, otherwise all review of systems negative.  Past Medical History:  Diagnosis Date   Bone spur    left foot   Chest pain    COVID-19    Diabetes mellitus type 2 in obese ([place]) 07/13/2013   Diverticulosis    Erectile dysfunction 01/15/2013   Esophageal reflux 01/15/2013   Fatty liver 07/04/2015   GERD (gastroesophageal reflux disease)    Hiatal hernia    Hiatal hernia with gastroesophageal reflux 03/20/2014   Hyperglycemia 07/13/2013   Hyperplastic colon polyp    Hypertension    Internal hemorrhoids    Mixed hyperlipidemia    Murmur    OSA (obstructive sleep apnea)    s/p UPPP   Plantar fasciitis    Prediabetes    Reflux esophagitis    Stomach ulcer    from PCP   Urinary hesitancy 09/30/2015    Past Surgical History:  Procedure Laterality Date   24 HOUR [place] STUDY N/A 02/28/2016   Procedure: 24 HOUR [place] STUDY;  Surgeon: Jerene Bears, MD;  Location: [facility];  Service: Gastroenterology;  Laterality: N/A;   ESOPHAGEAL MANOMETRY N/A 09/01/2013   Procedure: ESOPHAGEAL MANOMETRY (EM);  Surgeon: Sable Feil, MD;  Location: [facility]  ENDOSCOPY;  Service: Endoscopy;  Laterality: N/A;   ESOPHAGEAL MANOMETRY N/A 02/28/2016   Procedure: ESOPHAGEAL MANOMETRY (EM);  Surgeon: Jerene Bears, MD;  Location: [facility] ENDOSCOPY;  Service: Gastroenterology;  Laterality: N/A;   INGUINAL HERNIA REPAIR     TONSILLECTOMY     UMBILICAL HERNIA REPAIR     UVULECTOMY       reports that he has never smoked. He has never used smokeless tobacco. He reports that he does not drink alcohol or use drugs.  Allergies  Allergen Reactions   Hydrocodone Itching   Tramadol Itching    Family History  Problem Relation Age of Onset   Dementia Mother    Diabetes Mother    Hypertension Mother    Heart failure Mother    Stroke Mother    Obesity Mother    Hypertension Other        siblings   Diabetes Sister    Cancer Brother        lung cancer smoker   Heart attack Maternal Grandmother    Diabetes Sister    Pancreatic disease Sister    Obesity Father    Colon cancer Neg Hx      Prior to Admission medications   Medication Sig Start Date End Date Taking? Authorizing Provider  acetaminophen (TYLENOL) 500 MG tablet Take 1 tablet (500 mg total) by mouth every 6 (six) hours as  needed. 05/24/16   [name], MD  albuterol (PROVENTIL HFA;VENTOLIN HFA) 108 (90 Base) MCG/ACT inhaler Inhale 2 puffs into the lungs every 6 (six) hours as needed for wheezing or shortness of breath. 12/06/17   Debbrah Alar, NP  amLODipine (NORVASC) 5 MG tablet Take 1 tablet by mouth once daily 01/15/19   Copland, Gay Filler, MD  aspirin EC 81 MG tablet Take 81 mg by mouth daily.    [provider]  BAYER MICROLET LANCETS lancets Test blood sugars twice daily 10/12/17   Shelda Pal, DO  benzonatate (TESSALON) 100 MG capsule Take 2 capsules (200 mg total) by mouth 2 (two) times daily as needed for cough. 07/18/19   Little, Wenda Overland, MD  Blood Glucose Monitoring Suppl (CONTOUR NEXT MONITOR) w/Device KIT 1 kit by Does not apply  route 2 (two) times daily. 03/15/17   Dennard Nip D, MD  diclofenac (VOLTAREN) 75 MG EC tablet Take 1 tablet (75 mg total) by mouth 2 (two) times daily. 06/19/19   Wallene Huh, DPM  famotidine (PEPCID) 40 MG tablet Take 1 tablet (40 mg total) by mouth daily. 10/14/18   [name], MD  fluticasone (FLONASE) 50 MCG/ACT nasal spray Place 2 sprays into both nostrils daily. 06/11/18   [name], MD  glucose blood (CONTOUR NEXT TEST) test strip Test blood sugars twice daily 10/12/17   Shelda Pal, DO  meloxicam (MOBIC) 15 MG tablet Take 1 tablet (15 mg total) by mouth daily. 07/19/19   Virgel Manifold, MD  metFORMIN (GLUCOPHAGE) 500 MG tablet Take 1 tablet (500 mg total) by mouth daily with breakfast. Patient not taking: Reported on 07/14/2019 02/03/19   [name], MD  Multiple Vitamin (MULTIVITAMIN) tablet Take 1 tablet by mouth daily.    [provider]  omeprazole (PRILOSEC) 40 MG capsule Take 1 capsule (40 mg total) by mouth every morning. 02/13/19   Pyrtle, Lajuan Lines, MD  ondansetron (ZOFRAN) 4 MG tablet Take 4 mg by mouth every 8 (eight) hours as needed for nausea or vomiting.    [provider]  pantoprazole (PROTONIX) 40 MG tablet Take 1 tablet (40 mg total) by mouth 2 (two) times daily. 02/03/19   [name], MD  rosuvastatin (CRESTOR) 10 MG tablet Take 1 tablet by mouth once daily 02/21/19   Richardo Priest, MD  tadalafil (CIALIS) 5 MG tablet Take 1 tablet (5 mg total) by mouth daily as needed for erectile dysfunction. 12/06/17   Debbrah Alar, NP  triamterene-hydrochlorothiazide [facility]) 37.5-25 MG tablet Take 1 tablet by mouth once daily 05/26/19   [name], MD  Vitamin D, Ergocalciferol, (DRISDOL) 1.25 MG (50000 UT) CAPS capsule TAKE 1 CAPSULE BY MOUTH ONCE A WEEK FOR  12  WEEKS 05/27/19   [name], MD  amitriptyline (ELAVIL) 25 MG tablet Take 1 tablet (25 mg total) by mouth at bedtime. Patient not taking: Reported on 07/14/2019  02/13/19 07/18/19  Pyrtle, Lajuan Lines, MD  hyoscyamine (LEVSIN SL) 0.125 MG SL tablet Place 1 tablet (0.125 mg total) under the tongue every 4 (four) hours as needed. Patient not taking: Reported on 07/14/2019 06/11/18 07/18/19  [name], MD    Physical Exam: Vitals:   07/24/19 0200 07/24/19 0423 07/24/19 0500 07/24/19 0509  BP: (!) 150/89 (!) 155/101 (!) 147/90 (!) 147/90  Pulse: 61 67 62 (!) 58  Resp: (!) 22 (!) 22 (!) 21   Temp: 98.4 F (36.9 C)  98.3  F (36.8 C)   TempSrc:   Oral   SpO2: 96% 96% 98% 97%  Weight:   118.5 kg   Height:   5' 11.5" (1.816 m)     Constitutional: NAD, calm, comfortable Eyes: PERRL, lids and conjunctivae normal ENMT: Mucous membranes are moist. Posterior pharynx clear of any exudate or lesions.Normal dentition.  Neck: normal, supple, no masses, no thyromegaly Respiratory: clear to auscultation bilaterally, no wheezing, no crackles. Normal respiratory effort. No accessory muscle use.  Cardiovascular: Regular rate and rhythm, no murmurs / rubs / gallops. No extremity edema. 2+ pedal pulses. No carotid bruits.  Abdomen: no tenderness, no masses palpated. No hepatosplenomegaly. Bowel sounds positive.  Musculoskeletal: no clubbing / cyanosis. No joint deformity upper and lower extremities. Good ROM, no contractures. Normal muscle tone.  Skin: no rashes, lesions, ulcers. No induration Neurologic: CN 2-12 grossly intact. Sensation intact, DTR normal. Strength 5/5 in all 4.  Psychiatric: Normal judgment and insight. Alert and oriented x 3. Normal mood.    Labs on Admission: I have personally reviewed following labs and imaging studies  CBC: Recent Labs  Lab 07/18/19 1007 07/23/19 1520  WBC 6.5 3.9*  NEUTROABS 4.4  --   HGB 16.4 17.5*  HCT 50.4 53.4*  MCV 87.8 86.1  PLT 191 203*   Basic Metabolic Panel: Recent Labs  Lab 07/18/19 1007 07/23/19 1600  NA 136 133*  K 3.8 3.8  CL 102 99  CO2 26 24  GLUCOSE 89 108*  BUN 16 13  CREATININE 0.95 1.17    CALCIUM 9.1 8.2*   GFR: Estimated Creatinine Clearance: 98.6 mL/min (by C-G formula based on SCr of 1.17 mg/dL). Liver Function Tests: Recent Labs  Lab 07/18/19 1007 07/23/19 1600  AST 24 35  ALT 42 46*  ALKPHOS 72 59  BILITOT 0.6 0.4  PROT 7.6 7.4  ALBUMIN 4.3 3.5   Recent Labs  Lab 07/23/19 1600  LIPASE 36   No results for input(s): AMMONIA in the last 168 hours. Coagulation Profile: No results for input(s): INR, PROTIME in the last 168 hours. Cardiac Enzymes: No results for input(s): CKTOTAL, CKMB, CKMBINDEX, TROPONINI in the last 168 hours. BNP (last 3 results) No results for input(s): PROBNP in the last 8760 hours. HbA1C: No results for input(s): HGBA1C in the last 72 hours. CBG: No results for input(s): GLUCAP in the last 168 hours. Lipid Profile: Recent Labs    07/23/19 1615  TRIG 132   Thyroid Function Tests: No results for input(s): TSH, T4TOTAL, FREET4, T3FREE, THYROIDAB in the last 72 hours. Anemia Panel: Recent Labs    07/23/19 1615  FERRITIN 2,038*   Urine analysis:    Component Value Date/Time   COLORURINE YELLOW 07/23/2019 1520   APPEARANCEUR CLEAR 07/23/2019 1520   LABSPEC >1.030 (H) 07/23/2019 1520   PHURINE 6.0 07/23/2019 1520   GLUCOSEU NEGATIVE 07/23/2019 1520   GLUCOSEU NEGATIVE 09/21/2016 0911   HGBUR MODERATE (A) 07/23/2019 1520   BILIRUBINUR NEGATIVE 07/23/2019 1520   BILIRUBINUR negative 12/06/2017 1000   KETONESUR 15 (A) 07/23/2019 1520   PROTEINUR >300 (A) 07/23/2019 1520   UROBILINOGEN 0.2 12/06/2017 1000   UROBILINOGEN 0.2 09/21/2016 0911   NITRITE NEGATIVE 07/23/2019 1520   LEUKOCYTESUR NEGATIVE 07/23/2019 1520    Radiological Exams on Admission: Dg Chest Portable 1 View  Result Date: 07/23/2019 CLINICAL DATA:  Shortness of breath, vomiting, and diarrhea. COVID-19. Fever. Chest pain and abdominal pain. Headache. EXAM: PORTABLE CHEST 1 VIEW COMPARISON:  07/18/2019 and 11/26/2018 FINDINGS: There  is a new faint  infiltrate at the right lung base as well as a new small patchy area of infiltrate and atelectasis in the left mid and lower lung zones. Heart size and pulmonary vascularity are normal. No discrete effusions. No acute bone abnormality. IMPRESSION: 1. New small areas of infiltrate in both lungs. 2. No other significant abnormalities. Electronically Signed   By: Lorriane Shire M.D.   On: 07/23/2019 15:15    EKG: Independently reviewed.  Assessment/Plan Principal Problem:   Pneumonia due to COVID-19 virus Active Problems:   Hyperlipidemia   Essential hypertension   Type 2 diabetes mellitus without complication, without long-term current use of insulin ([name])   Acute respiratory failure with hypoxia ([name])    1. COVID PNA with new O2 requirement - 1. COVID pathway 2. remdesivir 3. Steroids (got solumedrol in ED, putting him on decadron daily this morning). 4. No WBC elevation, procalcitonin neg, no indication for ABx at this time 5. Tylenol for fever 6. Cont pulse ox 7. Daily labs 2. DM2 - 1. Normally diet controlled (doesn't take the metformin) 2. But now on steroids 3. SSI mod scale AC to start with, may need further increases to insulin due to steroids 3. HTN - 1. Cont home amlodipine, maxide 4. HLD - cont crestor 5. GERD, H/O PUD - 1. Putting on max dose PPI given the steroid use (protonix 2m PO BID)  DVT prophylaxis: Lovenox Code Status: Full Family Communication: No family in room Disposition Plan: Home after admit Consults called: None Admission status: Admit to inpatient  Severity of Illness: The appropriate patient status for this patient is INPATIENT. Inpatient status is judged to be reasonable and necessary in order to provide the required intensity of service to ensure the patient's safety. The patient's presenting symptoms, physical exam findings, and initial radiographic and laboratory data in the context of their chronic comorbidities is felt to place them at high  risk for further clinical deterioration. Furthermore, it is not anticipated that the patient will be medically stable for discharge from the hospital within 2 midnights of admission. The following factors support the patient status of inpatient.   IP status due to new O2 requirement secondary to COVID-19 PNA.  * I certify that at the point of admission it is my clinical judgment that the patient will require inpatient hospital care spanning beyond 2 midnights from the point of admission due to high intensity of service, high risk for further deterioration and high frequency of surveillance required.*    Wei Poplaski M. DO [facility]  How to contact the [facility]ending or Consulting provider [place] covering provider during after hours [place] for this patient?  1. Check the care team in [facility] look for a) attending/consulting TRH provider listed and b) the [facility] LLCteam listed 2. Log into [email or web address]  Amion Physician Scheduling and messaging for groups and whole hospitals  On call and physician scheduling software for group practices, residents, hospitalists and other medical providers for call, clinic, rotation and shift schedules. OnCall [employer] is a hospital-wide system for scheduling doctors and paging doctors on call. EasyPlot is for scientific plotting and data analysis.  [email or web address]  and use [place]'s universal password to access. If you do not have the password, please contact the hospital operator.  3. Locate the TG[facility] Center LLCprovider you are looking for under [facility] and page to a number that you can be directly reached. 4. If you still have difficulty reaching the provider,  please page the [facility] (Director on Call) for the Hospitalists listed on amion for assistance.  07/24/2019, 6:18 AM

## 2019-07-25 ENCOUNTER — Telehealth: Payer: Commercial Managed Care - PPO | Admitting: Family Medicine

## 2019-07-25 LAB — CBC WITH DIFFERENTIAL/PLATELET
Abs Immature Granulocytes: 0.04 10*3/uL (ref 0.00–0.07)
Basophils Absolute: 0 10*3/uL (ref 0.0–0.1)
Basophils Relative: 0 %
Eosinophils Absolute: 0 10*3/uL (ref 0.0–0.5)
Eosinophils Relative: 0 %
HCT: 45.8 % (ref 39.0–52.0)
Hemoglobin: 14.7 g/dL (ref 13.0–17.0)
Immature Granulocytes: 1 %
Lymphocytes Relative: 13 %
Lymphs Abs: 1 10*3/uL (ref 0.7–4.0)
MCH: 28.4 pg (ref 26.0–34.0)
MCHC: 32.1 g/dL (ref 30.0–36.0)
MCV: 88.4 fL (ref 80.0–100.0)
Monocytes Absolute: 0.2 10*3/uL (ref 0.1–1.0)
Monocytes Relative: 3 %
Neutro Abs: 6.5 10*3/uL (ref 1.7–7.7)
Neutrophils Relative %: 83 %
Platelets: 166 10*3/uL (ref 150–400)
RBC: 5.18 MIL/uL (ref 4.22–5.81)
RDW: 14.2 % (ref 11.5–15.5)
WBC: 7.7 10*3/uL (ref 4.0–10.5)
nRBC: 0 % (ref 0.0–0.2)

## 2019-07-25 LAB — COMPREHENSIVE METABOLIC PANEL
ALT: 58 U/L — ABNORMAL HIGH (ref 0–44)
AST: 38 U/L (ref 15–41)
Albumin: 3.2 g/dL — ABNORMAL LOW (ref 3.5–5.0)
Alkaline Phosphatase: 52 U/L (ref 38–126)
Anion gap: 12 (ref 5–15)
BUN: 18 mg/dL (ref 6–20)
CO2: 22 mmol/L (ref 22–32)
Calcium: 9.1 mg/dL (ref 8.9–10.3)
Chloride: 101 mmol/L (ref 98–111)
Creatinine, Ser: 0.97 mg/dL (ref 0.61–1.24)
GFR calc Af Amer: >60 mL/min (ref >60)
GFR calc non Af Amer: >60 mL/min (ref >60)
Glucose, Bld: 121 mg/dL — ABNORMAL HIGH (ref 70–99)
Potassium: 4.1 mmol/L (ref 3.5–5.1)
Sodium: 135 mmol/L (ref 135–145)
Total Bilirubin: 0.5 mg/dL (ref 0.3–1.2)
Total Protein: 6.6 g/dL (ref 6.5–8.1)

## 2019-07-25 LAB — URINE CULTURE: Culture: NO GROWTH

## 2019-07-25 LAB — D-DIMER, QUANTITATIVE: D-Dimer, Quant: 0.35 ug/mL-FEU (ref 0.00–0.50)

## 2019-07-25 LAB — C-REACTIVE PROTEIN: CRP: 3.1 mg/dL — ABNORMAL HIGH (ref <1.0)

## 2019-07-25 LAB — GLUCOSE, CAPILLARY
Glucose-Capillary: 189 mg/dL — ABNORMAL HIGH (ref 70–99)
Glucose-Capillary: 130 mg/dL — ABNORMAL HIGH (ref 70–99)
Glucose-Capillary: 98 mg/dL (ref 70–99)
Glucose-Capillary: 77 mg/dL (ref 70–99)
Glucose-Capillary: 158 mg/dL — ABNORMAL HIGH (ref 70–99)

## 2019-07-25 LAB — MAGNESIUM: Magnesium: 1.9 mg/dL (ref 1.7–2.4)

## 2019-07-25 NOTE — Evaluation (Signed)
Occupational Therapy Evaluation Patient Details Name: [name] Foster Thede MRN: [id number] DOB: 1966/01/14 Today's Date: 07/25/2019    History of Present Illness 53 y/o male w/ hx of DM, OSA, murmur, HLD, HTN, GERD, diverticulosis, chest pain and recent dx of COVID. presented w/ c/o cough, gen body aches,   Clinical Impression   Pt admitted with above diagnoses, presenting with compromised cardiopulmonary status limiting ability to engage in BADL at desired level of independence. PTA pt fully independent. At time of eval, pt on RA. He completed standing grooming at the sink and functional mobility beyond household distance in the hall remaining on RA. Sats maintaining in mid 80s. Education given on ECS strategies and pursed lip breathing, as well as how to monitor self with O2 and RR readings on portable monitor. IS And flutter education also given with pt pulling 1000 on IS. States he can usually pull more but is limited by coughing spell at this time. Given current status, no post acute OT needed. Will continue to follow acutely per POC listed below.     Follow Up Recommendations  No OT follow up    Equipment Recommendations  None recommended by OT    Recommendations for Other Services       Precautions / Restrictions Precautions Precautions: Fall Restrictions Weight Bearing Restrictions: No      Mobility Bed Mobility               General bed mobility comments: up in chair  Transfers Overall transfer level: Needs assistance Equipment used: None Transfers: Sit to/from Stand Sit to Stand: Supervision         General transfer comment: line management only    Balance Overall balance assessment: No apparent balance deficits (not formally assessed)                                         ADL either performed or assessed with clinical judgement   ADL Overall ADL's : Needs assistance/impaired Eating/Feeding: Set up;Sitting   Grooming: Set  up;Standing;Oral care Grooming Details (indicate cue type and reason): standing at sink to brush teeth Upper Body Bathing: Set up;Sitting   Lower Body Bathing: Set up;Sit to/from stand   Upper Body Dressing : Set up;Sitting   Lower Body Dressing: Set up;Sit to/from Archivist: Supervision/safety;Regular Museum/gallery exhibitions officer and Hygiene: Modified independent   Tub/ Banker: Supervision/safety;Ambulation;Shower seat   Functional mobility during ADLs: Supervision/safety General ADL Comments: pt overall set up-supervision for BADLs, mostly limited from compromise in cardiopulmonary status     Vision Patient Visual Report: No change from baseline       Perception     Praxis      Pertinent Vitals/Pain Pain Assessment: Faces Faces Pain Scale: Hurts a little bit Pain Location: states chest and back hurt from coughing Pain Descriptors / Indicators: Aching Pain Intervention(s): Monitored during session     Hand Dominance Right   Extremity/Trunk Assessment Upper Extremity Assessment Upper Extremity Assessment: Generalized weakness   Lower Extremity Assessment Lower Extremity Assessment: Defer to PT evaluation       Communication Communication Communication: No difficulties   Cognition Arousal/Alertness: Awake/alert Behavior During Therapy: WFL for tasks assessed/performed Overall Cognitive Status: Within Functional Limits for tasks assessed  General Comments       Exercises     Shoulder Instructions      Home Living Family/patient expects to be discharged to:: Private residence Living Arrangements: Spouse/significant other Available Help at Discharge: Family Type of Home: House Home Access: Level entry     Home Layout: One level     Bathroom Shower/Tub: Teacher, early years/pre: Standard     Home Equipment: None          Prior  Functioning/Environment Level of Independence: Independent        Comments: hasn't worked due to orthopedic injury but otherwise independent        OT Problem List: Decreased strength;Decreased knowledge of use of DME or AE;Decreased activity tolerance;Cardiopulmonary status limiting activity      OT Treatment/Interventions: Self-care/ADL training;Therapeutic exercise;Patient/family education;Energy conservation;Therapeutic activities;DME and/or AE instruction    OT Goals(Current goals can be found in the care plan section) Acute Rehab OT Goals Patient Stated Goal: get healthy OT Goal Formulation: With patient Time For Goal Achievement: 08/08/19 Potential to Achieve Goals: Good  OT Frequency: Min 3X/week   Barriers to D/C:            Co-evaluation              AM-PAC OT "6 Clicks" Daily Activity     Outcome Measure Help from another person eating meals?: None Help from another person taking care of personal grooming?: A Little Help from another person toileting, which includes using toliet, bedpan, or urinal?: A Little Help from another person bathing (including washing, rinsing, drying)?: A Little Help from another person to put on and taking off regular upper body clothing?: None Help from another person to put on and taking off regular lower body clothing?: A Little 6 Click Score: 20   End of Session Equipment Utilized During Treatment: Gait belt;Oxygen Nurse Communication: Mobility status  Activity Tolerance: Patient tolerated treatment well Patient left: in chair;with call bell/phone within reach  OT Visit Diagnosis: Other abnormalities of gait and mobility (R26.89);Muscle weakness (generalized) (M62.81);Other (comment)(cardiopulmonary status compromise to BADLs)                Time: [id number] OT Time Calculation (min): 31 min Charges:  OT General Charges $OT Visit: 1 Visit OT Evaluation $OT Eval Low Complexity: 1 Low OT Treatments $Self Care/Home  Management : 8-22 mins  Zenovia Jarred, MSOT, OTR/L Behavioral Health OT/ Acute Relief OT [facility] Office: [phone]   Zenovia Jarred 07/25/2019, 4:26 PM

## 2019-07-25 NOTE — Progress Notes (Signed)
PROGRESS NOTE                                                                                                                                                                                                             Patient Demographics:    [name] Foster, is a 53 y.o. male, DOB - 1966-01-05, GGE:[id number]  Outpatient Primary MD for the patient is [name], MD    LOS - 2  Admit date - 07/23/2019    Chief Complaint  Patient presents with  . SOB HA Covid VD, CP       Brief Narrative  - [name] Foster is a 53 y.o. male with medical history significant of HTN, DM2 (diet controlled), PUD, GERD.  Patient diagnosed with COVID 11/6. Patient presents to the ED with progressively worsening cough, fatigue, fever, SOB.  Not helped by OTC meds at home.  Associated headache, muscle aches.  Symptoms constant & worsening. Had Covid PNA with hypoxia in the ER.   Subjective:   Patient in bed, appears comfortable, denies any headache, no fever, no chest pain or pressure, no shortness of breath but has a mild dry cough , no abdominal pain. No focal weakness.    Assessment  & Plan :     1. Acute Hypoxic Resp. Failure due to Acute Covid 19 Viral Pneumonitis during the ongoing 2020 Covid 19 Pandemic - has been started on IV Remdesivir and Steroids, clinically improving, advance activity, monitor closely, he has consented for Con.Plasma if needed. Currently stable on 2 lits o2.    COVID-19 Labs  Recent Labs    07/23/19 1520 07/23/19 1615 07/24/19 0800 07/25/19 0034  DDIMER 0.87*  --  0.72* 0.35  FERRITIN  --  2,038*  --   --   LDH  --  212*  --   --   CRP  --  7.4* 5.3* 3.1*    Lab Results  Component Value Date   SARSCOV2NAA DETECTED (A) 07/18/2019   [place] Not Detected 07/11/2019   [place] Not Detected 02/10/2019     SpO2: 91 % O2 Flow Rate (L/min): 2 L/min  Hepatic Function Latest Ref Rng & Units  07/25/2019 07/23/2019 07/18/2019  Total Protein 6.5 - 8.1 g/dL 6.6 7.4 7.6  Albumin 3.5 - 5.0 g/dL 3.2(L) 3.5 4.3  AST 15 - 41 U/L  38 35 24  ALT 0 - 44 U/L 58(H) 46(H) 42  Alk Phosphatase 38 - 126 U/L 52 59 72  Total Bilirubin 0.3 - 1.2 mg/dL 0.5 0.4 0.6  Bilirubin, Direct 0.0 - 0.3 mg/dL - - -     No results found for: BNP    2.  HTN - placed on Norvasc increased dose and added Hydralazine.  3.  H/O TB - treated decades ago.  4. GERD - PPI  5. Dyslipidemia - on statin.  6. DM2 - good outpt control ISS and monitor.  Lab Results  Component Value Date   HGBA1C 6.3 (H) 07/24/2019   CBG (last 3)  Recent Labs    07/24/19 1242 07/24/19 1705 07/25/19 0821  GLUCAP 162* 149* 130*      Condition - Fair  Family Communication  :  Wife 07/24/19 - 10.20 am > 20 rings on her cell - [phone] x 2  Code Status :  Full  Diet :   Diet Order            Diet Carb Modified Fluid consistency: Thin; Room service appropriate? Yes  Diet effective now               Disposition Plan  :  Home  Consults  :  None  Procedures  :     PUD Prophylaxis : PPI  DVT Prophylaxis  :  Lovenox    Lab Results  Component Value Date   PLT 166 07/25/2019    Inpatient Medications  Scheduled Meds: . albuterol  2 puff Inhalation Q6H  . amLODipine  10 mg Oral Daily  . aspirin EC  81 mg Oral Daily  . dexamethasone  6 mg Oral Q24H  . enoxaparin (LOVENOX) injection  60 mg Subcutaneous Daily  . hydrALAZINE  50 mg Oral Q8H  . insulin aspart  0-5 Units Subcutaneous QHS  . insulin aspart  0-9 Units Subcutaneous TID WC  . mouth rinse  15 mL Mouth Rinse BID  . multivitamin with minerals  1 tablet Oral Daily  . pantoprazole  80 mg Oral BID  . rosuvastatin  10 mg Oral q1800  . triamterene-hydrochlorothiazide  1 tablet Oral Daily   Continuous Infusions: . sodium chloride 10 mL/hr at 07/24/19 1200  . remdesivir 100 mg in NS 250 mL     PRN Meds:.sodium chloride, acetaminophen, albuterol,  benzonatate, guaiFENesin-dextromethorphan, ondansetron **OR** ondansetron (ZOFRAN) IV  Antibiotics  :    Anti-infectives (From admission, onward)   Start     Dose/Rate Route Frequency Ordered Stop   07/25/19 1000  remdesivir 100 mg in sodium chloride 0.9 % 250 mL IVPB     100 mg 500 mL/hr over 30 Minutes Intravenous Every 24 hours 07/24/19 0627 07/29/19 0959   07/24/19 0730  remdesivir 200 mg in sodium chloride 0.9 % 250 mL IVPB     200 mg 500 mL/hr over 30 Minutes Intravenous Once 07/24/19 7711 07/24/19 0923       Time Spent in minutes  30   Lala Lund M.D on 07/25/2019 at 9:13 AM  To page go to [email or web address] - password [facility]  [facility] -  Office  [phone]    See all Orders from today for further details    Objective:   Vitals:   07/24/19 1630 07/24/19 1927 07/25/19 0400 07/25/19 0800  BP: 129/75 129/73 (!) 142/87 130/81  Pulse: 68 65 71 80  Resp: (!) '21 14 20 ' (!) 22  Temp: 98.7 F (37.1  C) 98.2 F (36.8 C) 97.6 F (36.4 C) 98.3 F (36.8 C)  TempSrc: Oral Oral Oral Oral  SpO2: 90% 93% 95% 91%  Weight:      Height:        Wt Readings from Last 3 Encounters:  07/24/19 118.5 kg  07/19/19 118 kg  07/18/19 117.9 kg     Intake/Output Summary (Last 24 hours) at 07/25/2019 0913 Last data filed at 07/25/2019 0600 Gross per 24 hour  Intake 1079.3 ml  Output 1850 ml  Net -770.7 ml     Physical Exam  Awake Alert, Oriented X 3, No new F.N deficits, Normal affect [place].AT,PERRAL Supple Neck,No JVD, No cervical lymphadenopathy appriciated.  Symmetrical Chest wall movement, Good air movement bilaterally, CTAB RRR,No Gallops, Rubs or new Murmurs, No Parasternal Heave +ve B.Sounds, Abd Soft, No tenderness, No organomegaly appriciated, No rebound - guarding or rigidity. No Cyanosis, Clubbing or edema, No new Rash or bruise   Data Review:    CBC Recent Labs  Lab 07/18/19 1007 07/23/19 1520 07/25/19 0034  WBC 6.5 3.9* 7.7  HGB 16.4 17.5* 14.7   HCT 50.4 53.4* 45.8  PLT 191 149* 166  MCV 87.8 86.1 88.4  MCH 28.6 28.2 28.4  MCHC 32.5 32.8 32.1  RDW 14.3 14.4 14.2  LYMPHSABS 1.2  --  1.0  MONOABS 0.8  --  0.2  EOSABS 0.0  --  0.0  BASOSABS 0.0  --  0.0    Chemistries  Recent Labs  Lab 07/18/19 1007 07/23/19 1600 07/25/19 0034  NA 136 133* 135  K 3.8 3.8 4.1  CL 102 99 101  CO2 '26 24 22  ' GLUCOSE 89 108* 121*  BUN '16 13 18  ' CREATININE 0.95 1.17 0.97  CALCIUM 9.1 8.2* 9.1  MG  --   --  1.9  AST 24 35 38  ALT 42 46* 58*  ALKPHOS 72 59 52  BILITOT 0.6 0.4 0.5   ------------------------------------------------------------------------------------------------------------------ Recent Labs    07/23/19 1615  TRIG 132    Lab Results  Component Value Date   HGBA1C 6.3 (H) 07/24/2019   ------------------------------------------------------------------------------------------------------------------ No results for input(s): TSH, T4TOTAL, T3FREE, THYROIDAB in the last 72 hours.  Invalid input(s): FREET3  Cardiac Enzymes No results for input(s): CKMB, TROPONINI, MYOGLOBIN in the last 168 hours.  Invalid input(s): CK ------------------------------------------------------------------------------------------------------------------ No results found for: BNP  Micro Results Recent Results (from the past 240 hour(s))  Novel Coronavirus, NAA (Hosp order, Send-out to Ref Lab; TAT 18-24 hrs     Status: Abnormal   Collection Time: 07/18/19 10:07 AM   Specimen: Nasopharyngeal Swab; Respiratory  Result Value Ref Range Status   SARS-CoV-2, NAA DETECTED (A) NOT DETECTED Final    Comment: CRITICAL RESULT CALLED TO, READ BACK BY AND VERIFIED WITH:  LORI Truxton [phone] [id number] FCP (NOTE)                  Client Requested Flag This nucleic acid amplification test was developed and its performance characteristics determined by [employer]. Nucleic acid amplification tests include PCR and TMA. This test has not been FDA  cleared or approved. This test has been authorized by FDA under an Emergency Use Authorization (EUA). This test is only authorized for the duration of time the declaration that circumstances exist justifying the authorization of the emergency use of in vitro diagnostic tests for detection of SARS-CoV-2 virus and/or diagnosis of COVID-19 infection under section 564(b)(1) of the Act, 21 U.S.C. 256LSL-3(T) (1), unless the authorization  is terminated or revoked sooner. When diagnostic testing is negative, the possibility of a false negative result should be considered in the context of a patient's recent exposures and the presence of clinica l signs and symptoms consistent with COVID-19. An individual without symptoms of COVID- 19 and who is not shedding SARS-CoV-2 virus would expect to have a negative (not detected) result in this assay. Performed At: [facility] [place], [place] [id number] Rush Farmer MD [id number] Performed at [facility], [place] [address], [place], [place] [address]    Coronavirus Source NASAL SWAB  Corrected    Comment: Performed at [facility], [address] [place], [place] [address] CORRECTED ON 11/06 AT 1023: PREVIOUSLY REPORTED AS NASOPHARYNGEAL   Urine culture     Status: None   Collection Time: 07/23/19  3:20 PM   Specimen: Urine, Clean Catch  Result Value Ref Range Status   Specimen Description   Final    URINE, CLEAN CATCH Performed at [facility], [address] [place], [place] [address]    Special Requests   Final    NONE Performed at [facility], [address] [place], [place] [address]    Culture   Final    NO GROWTH Performed at [facility], [place] [address] [place], [place] [address]    Report Status 07/25/2019 FINAL  Final  Blood Culture (routine x 2)     Status: None (Preliminary result)   Collection Time: 07/23/19  3:45 PM   Specimen: BLOOD  Result Value  Ref Range Status   Specimen Description   Final    BLOOD BLOOD Performed at [facility], [address] [place], [place] [address]    Special Requests   Final    BOTTLES DRAWN AEROBIC AND ANAEROBIC Blood Culture adequate volume Performed at [facility], [address] [place], [place] [address]    Culture   Final    NO GROWTH < 12 HOURS Performed at [facility], [place] [address] [place], [place] [address]    Report Status PENDING  Incomplete    Radiology Reports Dg Chest Portable 1 View  Result Date: 07/23/2019 CLINICAL DATA:  Shortness of breath, vomiting, and diarrhea. COVID-19. Fever. Chest pain and abdominal pain. Headache. EXAM: PORTABLE CHEST 1 VIEW COMPARISON:  07/18/2019 and 11/26/2018 FINDINGS: There is a new faint infiltrate at the right lung base as well as a new small patchy area of infiltrate and atelectasis in the left mid and lower lung zones. Heart size and pulmonary vascularity are normal. No discrete effusions. No acute bone abnormality. IMPRESSION: 1. New small areas of infiltrate in both lungs. 2. No other significant abnormalities. Electronically Signed   By: Lorriane Shire M.D.   On: 07/23/2019 15:15   Dg Chest Port 1 View  Result Date: 07/18/2019 CLINICAL DATA:  Chest pain and shortness of breath. EXAM: PORTABLE CHEST 1 VIEW COMPARISON:  November 26, 2018 FINDINGS: The lungs are clear. Heart is mildly enlarged with pulmonary vascularity normal. No adenopathy. No bone lesions. IMPRESSION: Mild cardiomegaly.  No edema or consolidation. Electronically Signed   By: Lowella Grip III M.D.   On: 07/18/2019 11:07

## 2019-07-25 NOTE — Progress Notes (Signed)
Patient walked in the hall without oxygen for about 315ft. Patient maintained an O2 saturation above 90% the entire walk and did not complain of any shortness of breath.

## 2019-07-25 NOTE — Progress Notes (Signed)
Patient did not want me to call and update anyone, said he would take care of it himself.

## 2019-07-26 ENCOUNTER — Inpatient Hospital Stay (HOSPITAL_COMMUNITY): Payer: Commercial Managed Care - PPO

## 2019-07-26 DIAGNOSIS — R7989 Other specified abnormal findings of blood chemistry: Secondary | ICD-10-CM

## 2019-07-26 LAB — COMPREHENSIVE METABOLIC PANEL
ALT: 53 U/L — ABNORMAL HIGH (ref 0–44)
AST: 34 U/L (ref 15–41)
Albumin: 3.4 g/dL — ABNORMAL LOW (ref 3.5–5.0)
Alkaline Phosphatase: 52 U/L (ref 38–126)
Anion gap: 14 (ref 5–15)
BUN: 18 mg/dL (ref 6–20)
CO2: 20 mmol/L — ABNORMAL LOW (ref 22–32)
Calcium: 9.3 mg/dL (ref 8.9–10.3)
Chloride: 102 mmol/L (ref 98–111)
Creatinine, Ser: 0.91 mg/dL (ref 0.61–1.24)
GFR calc Af Amer: >60 mL/min (ref >60)
GFR calc non Af Amer: >60 mL/min (ref >60)
Glucose, Bld: 108 mg/dL — ABNORMAL HIGH (ref 70–99)
Potassium: 4.6 mmol/L (ref 3.5–5.1)
Sodium: 136 mmol/L (ref 135–145)
Total Bilirubin: 0.7 mg/dL (ref 0.3–1.2)
Total Protein: 7.3 g/dL (ref 6.5–8.1)

## 2019-07-26 LAB — CBC WITH DIFFERENTIAL/PLATELET
Abs Immature Granulocytes: 0.05 10*3/uL (ref 0.00–0.07)
Basophils Absolute: 0 10*3/uL (ref 0.0–0.1)
Basophils Relative: 0 %
Eosinophils Absolute: 0 10*3/uL (ref 0.0–0.5)
Eosinophils Relative: 0 %
HCT: 50.1 % (ref 39.0–52.0)
Hemoglobin: 16.6 g/dL (ref 13.0–17.0)
Immature Granulocytes: 1 %
Lymphocytes Relative: 9 %
Lymphs Abs: 0.9 10*3/uL (ref 0.7–4.0)
MCH: 28.8 pg (ref 26.0–34.0)
MCHC: 33.1 g/dL (ref 30.0–36.0)
MCV: 86.8 fL (ref 80.0–100.0)
Monocytes Absolute: 0.4 10*3/uL (ref 0.1–1.0)
Monocytes Relative: 5 %
Neutro Abs: 8.1 10*3/uL — ABNORMAL HIGH (ref 1.7–7.7)
Neutrophils Relative %: 85 %
Platelets: 114 10*3/uL — ABNORMAL LOW (ref 150–400)
RBC: 5.77 MIL/uL (ref 4.22–5.81)
RDW: 14.3 % (ref 11.5–15.5)
WBC: 9.5 10*3/uL (ref 4.0–10.5)
nRBC: 0 % (ref 0.0–0.2)

## 2019-07-26 LAB — MAGNESIUM: Magnesium: 2.1 mg/dL (ref 1.7–2.4)

## 2019-07-26 LAB — GLUCOSE, CAPILLARY
Glucose-Capillary: 108 mg/dL — ABNORMAL HIGH (ref 70–99)
Glucose-Capillary: 112 mg/dL — ABNORMAL HIGH (ref 70–99)
Glucose-Capillary: 174 mg/dL — ABNORMAL HIGH (ref 70–99)

## 2019-07-26 LAB — D-DIMER, QUANTITATIVE: D-Dimer, Quant: >20 ug/mL-FEU — ABNORMAL HIGH (ref 0.00–0.50)

## 2019-07-26 LAB — C-REACTIVE PROTEIN: CRP: 1.6 mg/dL — ABNORMAL HIGH (ref <1.0)

## 2019-07-26 MED ORDER — ENOXAPARIN SODIUM 60 MG/0.6ML ~~LOC~~ SOLN
60.0000 mg | Freq: Once | SUBCUTANEOUS | Status: AC
  Administered 2019-07-26: 60 mg via SUBCUTANEOUS
  Filled 2019-07-26: qty 0.6

## 2019-07-26 MED ORDER — ENOXAPARIN SODIUM 120 MG/0.8ML ~~LOC~~ SOLN
1.0000 mg/kg | Freq: Two times a day (BID) | SUBCUTANEOUS | Status: DC
  Administered 2019-07-26: 120 mg via SUBCUTANEOUS
  Filled 2019-07-26 (×2): qty 0.8

## 2019-07-26 MED ORDER — DEXAMETHASONE 6 MG PO TABS
3.0000 mg | ORAL_TABLET | ORAL | Status: DC
  Administered 2019-07-26: 3 mg via ORAL
  Filled 2019-07-26: qty 1

## 2019-07-26 NOTE — Progress Notes (Signed)
PROGRESS NOTE                                                                                                                                                                                                             Patient Demographics:    [name] Foster, is a 53 y.o. male, DOB - 15-Jul-1966, JTT:[id number]  Outpatient Primary MD for the patient is [name], MD    LOS - 3  Admit date - 07/23/2019    Chief Complaint  Patient presents with   SOB HA Covid VD, CP       Brief Narrative  - [name] Foster is a 53 y.o. male with medical history significant of HTN, DM2 (diet controlled), PUD, GERD.  Patient diagnosed with COVID 11/6. Patient presents to the ED with progressively worsening cough, fatigue, fever, SOB.  Not helped by OTC meds at home.  Associated headache, muscle aches.  Symptoms constant & worsening. Had Covid PNA with hypoxia in the ER.   Subjective:   Patient in bed, appears comfortable, denies any headache, no fever, no chest pain or pressure, no shortness of breath , no abdominal pain. No focal weakness.   Assessment  & Plan :     1. Acute Hypoxic Resp. Failure due to Acute Covid 19 Viral Pneumonitis during the ongoing 2020 Covid 19 Pandemic - has been started on IV Remdesivir and Steroids, clinically improving, advance activity, [place] improving and currently on room air will start tapering steroids.  D-dimer has been noted below.    COVID-19 Labs  Recent Labs    07/23/19 1615 07/24/19 0800 07/25/19 0034 07/26/19 0439  DDIMER  --  0.72* 0.35 >20.00*  FERRITIN 2,038*  --   --   --   LDH 212*  --   --   --   CRP 7.4* 5.3* 3.1* 1.6*    Lab Results  Component Value Date   SARSCOV2NAA DETECTED (A) 07/18/2019   [place] Not Detected 07/11/2019   [place] Not Detected 02/10/2019     SpO2: 92 % O2 Flow Rate (L/min): 2 L/min  Hepatic Function Latest Ref Rng & Units 07/26/2019  07/25/2019 07/23/2019  Total Protein 6.5 - 8.1 g/dL 7.3 6.6 7.4  Albumin 3.5 - 5.0 g/dL 3.4(L) 3.2(L) 3.5  AST 15 - 41 U/L 34 38 35  ALT 0 -  44 U/L 53(H) 58(H) 46(H)  Alk Phosphatase 38 - 126 U/L 52 52 59  Total Bilirubin 0.3 - 1.2 mg/dL 0.7 0.5 0.4  Bilirubin, Direct 0.0 - 0.3 mg/dL - - -     No results found for: BNP    2.  HTN - placed on Norvasc increased dose and added Hydralazine.  3.  H/O TB - treated decades ago.  4. GERD - PPI  5. Dyslipidemia - on statin.  6.  Extremely high D-dimer.  Check lower extremity duplex, currently not short of breath or tachycardic hence clinically no PE.  Will switch him to therapeutic dose Lovenox.    7.DM2 - good outpt control ISS and monitor.  Lab Results  Component Value Date   HGBA1C 6.3 (H) 07/24/2019   CBG (last 3)  Recent Labs    07/25/19 1322 07/25/19 1737 07/25/19 2043  GLUCAP 98 77 158*      Condition - Fair  Family Communication  :  Wife 07/24/19 - 10.20 am > 20 rings on her cell - [phone] x 2  Code Status :  Full  Diet :   Diet Order            Diet Carb Modified Fluid consistency: Thin; Room service appropriate? Yes  Diet effective now               Disposition Plan  :  Home  Consults  :  None  Procedures  :     Lower extremity venous duplex ultrasound ordered 07/26/2019.  PUD Prophylaxis : PPI  DVT Prophylaxis  :  Lovenox    Lab Results  Component Value Date   PLT 114 (L) 07/26/2019    Inpatient Medications  Scheduled Meds:  albuterol  2 puff Inhalation Q6H   amLODipine  10 mg Oral Daily   aspirin EC  81 mg Oral Daily   dexamethasone  6 mg Oral Q24H   enoxaparin (LOVENOX) injection  60 mg Subcutaneous Daily   hydrALAZINE  50 mg Oral Q8H   insulin aspart  0-5 Units Subcutaneous QHS   insulin aspart  0-9 Units Subcutaneous TID WC   mouth rinse  15 mL Mouth Rinse BID   multivitamin with minerals  1 tablet Oral Daily   pantoprazole  80 mg Oral BID   rosuvastatin  10  mg Oral q1800   triamterene-hydrochlorothiazide  1 tablet Oral Daily   Continuous Infusions:  sodium chloride 10 mL/hr at 07/24/19 1200   remdesivir 100 mg in NS 250 mL Stopped (07/25/19 1035)   PRN Meds:.sodium chloride, acetaminophen, albuterol, benzonatate, guaiFENesin-dextromethorphan, ondansetron **OR** ondansetron (ZOFRAN) IV  Antibiotics  :    Anti-infectives (From admission, onward)   Start     Dose/Rate Route Frequency Ordered Stop   07/25/19 1000  remdesivir 100 mg in sodium chloride 0.9 % 250 mL IVPB     100 mg 500 mL/hr over 30 Minutes Intravenous Every 24 hours 07/24/19 0627 07/29/19 0959   07/24/19 0730  remdesivir 200 mg in sodium chloride 0.9 % 250 mL IVPB     200 mg 500 mL/hr over 30 Minutes Intravenous Once 07/24/19 0263 07/24/19 0923       Time Spent in minutes  30   Lala Lund M.D on 07/26/2019 at 9:15 AM  To page go to [email or web address] - password [email or web address]  [facility] -  Office  [phone]    See all Orders from today for further details    Objective:   Vitals:  07/26/19 0500 07/26/19 0555 07/26/19 0600 07/26/19 0759  BP:    127/75  Pulse: 75 64 66 75  Resp: (!) 24 (!) 23 (!) 24 (!) 22  Temp:    98.2 F (36.8 C)  TempSrc:    Oral  SpO2: (!) 87% 93% 92% 92%  Weight:      Height:        Wt Readings from Last 3 Encounters:  07/24/19 118.5 kg  07/19/19 118 kg  07/18/19 117.9 kg     Intake/Output Summary (Last 24 hours) at 07/26/2019 0915 Last data filed at 07/26/2019 0556 Gross per 24 hour  Intake 460 ml  Output 1850 ml  Net -1390 ml     Physical Exam  Awake Alert,  No new F.N deficits, Normal affect [place].AT,PERRAL Supple Neck,No JVD, No cervical lymphadenopathy appriciated.  Symmetrical Chest wall movement, Good air movement bilaterally, CTAB RRR,No Gallops, Rubs or new Murmurs, No Parasternal Heave +ve B.Sounds, Abd Soft, No tenderness, No organomegaly appriciated, No rebound - guarding or rigidity. No Cyanosis,  Clubbing or edema, No new Rash or bruise    Data Review:    CBC Recent Labs  Lab 07/23/19 1520 07/25/19 0034 07/26/19 0439  WBC 3.9* 7.7 9.5  HGB 17.5* 14.7 16.6  HCT 53.4* 45.8 50.1  PLT 149* 166 114*  MCV 86.1 88.4 86.8  MCH 28.2 28.4 28.8  MCHC 32.8 32.1 33.1  RDW 14.4 14.2 14.3  LYMPHSABS  --  1.0 0.9  MONOABS  --  0.2 0.4  EOSABS  --  0.0 0.0  BASOSABS  --  0.0 0.0    Chemistries  Recent Labs  Lab 07/23/19 1600 07/25/19 0034 07/26/19 0439  NA 133* 135 136  K 3.8 4.1 4.6  CL 99 101 102  CO2 24 22 20*  GLUCOSE 108* 121* 108*  BUN '13 18 18  ' CREATININE 1.17 0.97 0.91  CALCIUM 8.2* 9.1 9.3  MG  --  1.9 2.1  AST 35 38 34  ALT 46* 58* 53*  ALKPHOS 59 52 52  BILITOT 0.4 0.5 0.7   ------------------------------------------------------------------------------------------------------------------ Recent Labs    07/23/19 1615  TRIG 132    Lab Results  Component Value Date   HGBA1C 6.3 (H) 07/24/2019   ------------------------------------------------------------------------------------------------------------------ No results for input(s): TSH, T4TOTAL, T3FREE, THYROIDAB in the last 72 hours.  Invalid input(s): FREET3  Cardiac Enzymes No results for input(s): CKMB, TROPONINI, MYOGLOBIN in the last 168 hours.  Invalid input(s): CK ------------------------------------------------------------------------------------------------------------------ No results found for: BNP  Micro Results Recent Results (from the past 240 hour(s))  Novel Coronavirus, NAA (Hosp order, Send-out to Ref Lab; TAT 18-24 hrs     Status: Abnormal   Collection Time: 07/18/19 10:07 AM   Specimen: Nasopharyngeal Swab; Respiratory  Result Value Ref Range Status   SARS-CoV-2, NAA DETECTED (A) NOT DETECTED Final    Comment: CRITICAL RESULT CALLED TO, READ BACK BY AND VERIFIED WITH:  LORI Tieton [id number] [id number] FCP (NOTE)                  Client Requested Flag This nucleic acid  amplification test was developed and its performance characteristics determined by [employer]. Nucleic acid amplification tests include PCR and TMA. This test has not been FDA cleared or approved. This test has been authorized by FDA under an Emergency Use Authorization (EUA). This test is only authorized for the duration of time the declaration that circumstances exist justifying the authorization of the emergency use of in vitro diagnostic  tests for detection of SARS-CoV-2 virus and/or diagnosis of COVID-19 infection under section 564(b)(1) of the Act, 21 U.S.C. 480XKP-5(V) (1), unless the authorization is terminated or revoked sooner. When diagnostic testing is negative, the possibility of a false negative result should be considered in the context of a patient's recent exposures and the presence of clinica l signs and symptoms consistent with COVID-19. An individual without symptoms of COVID- 19 and who is not shedding SARS-CoV-2 virus would expect to have a negative (not detected) result in this assay. Performed At: [facility] [place], [place] [id number] Rush Farmer MD [id number] Performed at [facility], [place] [address], [place], [place] [address]    Coronavirus Source NASAL SWAB  Corrected    Comment: Performed at [facility], [address] [place], [place] [address] CORRECTED ON 11/06 AT 1023: PREVIOUSLY REPORTED AS NASOPHARYNGEAL   Urine culture     Status: None   Collection Time: 07/23/19  3:20 PM   Specimen: Urine, Clean Catch  Result Value Ref Range Status   Specimen Description   Final    URINE, CLEAN CATCH Performed at [facility], [address] [place], [place] [address]    Special Requests   Final    NONE Performed at [facility], [address], [place], [place] [address]    Culture   Final    NO GROWTH Performed at [facility], [place] [address] [place], [place]  [address]    Report Status 07/25/2019 FINAL  Final  Blood Culture (routine x 2)     Status: None (Preliminary result)   Collection Time: 07/23/19  3:45 PM   Specimen: BLOOD  Result Value Ref Range Status   Specimen Description   Final    BLOOD BLOOD Performed at [facility], [address] [place], [place] [address]    Special Requests   Final    BOTTLES DRAWN AEROBIC AND ANAEROBIC Blood Culture adequate volume Performed at [facility], [address] [place], [place] [address]    Culture   Final    NO GROWTH 2 DAYS Performed at [facility], [place] [address] [place], [place] [address]    Report Status PENDING  Incomplete    Radiology Reports Dg Chest Portable 1 View  Result Date: 07/23/2019 CLINICAL DATA:  Shortness of breath, vomiting, and diarrhea. COVID-19. Fever. Chest pain and abdominal pain. Headache. EXAM: PORTABLE CHEST 1 VIEW COMPARISON:  07/18/2019 and 11/26/2018 FINDINGS: There is a new faint infiltrate at the right lung base as well as a new small patchy area of infiltrate and atelectasis in the left mid and lower lung zones. Heart size and pulmonary vascularity are normal. No discrete effusions. No acute bone abnormality. IMPRESSION: 1. New small areas of infiltrate in both lungs. 2. No other significant abnormalities. Electronically Signed   By: Lorriane Shire M.D.   On: 07/23/2019 15:15   Dg Chest Port 1 View  Result Date: 07/18/2019 CLINICAL DATA:  Chest pain and shortness of breath. EXAM: PORTABLE CHEST 1 VIEW COMPARISON:  November 26, 2018 FINDINGS: The lungs are clear. Heart is mildly enlarged with pulmonary vascularity normal. No adenopathy. No bone lesions. IMPRESSION: Mild cardiomegaly.  No edema or consolidation. Electronically Signed   By: Lowella Grip III M.D.   On: 07/18/2019 11:07

## 2019-07-26 NOTE — Plan of Care (Signed)
Patient Summary: No adverse events throughout the night. No SOB or acute distress noted. Patient c/o headache throughout the night. Given tylenol x2 doses per MD PRN order with some relief. See MAR re: medications administered. See flowsheet re: assessment. VSS. Bed in low locked position. Plan of care continued.  Problem: Education: Goal: Knowledge of General Education information will improve Description: Including pain rating scale, medication(s)/side effects and non-pharmacologic comfort measures Outcome: Progressing   Problem: Health Behavior/Discharge Planning: Goal: Ability to manage health-related needs will improve Outcome: Progressing   Problem: Clinical Measurements: Goal: Ability to maintain clinical measurements within normal limits will improve Outcome: Progressing Goal: Will remain free from infection Outcome: Progressing Goal: Diagnostic test results will improve Outcome: Progressing Goal: Respiratory complications will improve Outcome: Progressing Goal: Cardiovascular complication will be avoided Outcome: Progressing   Problem: Activity: Goal: Risk for activity intolerance will decrease Outcome: Progressing   Problem: Nutrition: Goal: Adequate nutrition will be maintained Outcome: Progressing   Problem: Coping: Goal: Level of anxiety will decrease Outcome: Progressing   Problem: Elimination: Goal: Will not experience complications related to bowel motility Outcome: Progressing Goal: Will not experience complications related to urinary retention Outcome: Progressing   Problem: Pain Managment: Goal: General experience of comfort will improve Outcome: Progressing   Problem: Safety: Goal: Ability to remain free from injury will improve Outcome: Progressing   Problem: Skin Integrity: Goal: Risk for impaired skin integrity will decrease Outcome: Progressing

## 2019-07-26 NOTE — Plan of Care (Signed)
Patient Summary: No SOB or acute distress noted at this time. Patient continues to c/o headache. Spoke with wife at 43 given updates via phone. Wife requesting if patient can have a CT scan. 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 Addressed all questions and concerns she had pertaining to her husband. Plan of care continued. Bed in low locked position. Will continue to monitor patient.  Problem: Education: Goal: Knowledge of General Education information will improve Description: Including pain rating scale, medication(s)/side effects and non-pharmacologic comfort measures Outcome: Progressing   Problem: Health Behavior/Discharge Planning: Goal: Ability to manage health-related needs will improve Outcome: Progressing   Problem: Clinical Measurements: Goal: Ability to maintain clinical measurements within normal limits will improve Outcome: Progressing Goal: Will remain free from infection Outcome: Progressing Goal: Diagnostic test results will improve Outcome: Progressing Goal: Respiratory complications will improve Outcome: Progressing Goal: Cardiovascular complication will be avoided Outcome: Progressing   Problem: Activity: Goal: Risk for activity intolerance will decrease Outcome: Progressing   Problem: Nutrition: Goal: Adequate nutrition will be maintained Outcome: Progressing   Problem: Coping: Goal: Level of anxiety will decrease Outcome: Progressing   Problem: Elimination: Goal: Will not experience complications related to bowel motility Outcome: Progressing Goal: Will not experience complications related to urinary retention Outcome: Progressing   Problem: Pain Managment: Goal: General experience of comfort will improve Outcome: Progressing   Problem: Safety: Goal: Ability to remain free from injury will improve Outcome: Progressing   Problem: Skin  Integrity: Goal: Risk for impaired skin integrity will decrease Outcome: Progressing

## 2019-07-26 NOTE — Progress Notes (Signed)
Patient walked in the hall without oxygen for about 446ft. Patient maintained an O2 saturation above 90% the entire walk, did not complain of any shortness of breath and felt stronger.

## 2019-07-26 NOTE — Progress Notes (Signed)
ANTICOAGULATION CONSULT NOTE - Initial Consult  Pharmacy Consult for Lovenox  Indication: R/o VTE  Allergies  Allergen Reactions  . Hydrocodone Itching  . Tramadol Itching    Patient Measurements: Height: 5' 11.5" (181.6 cm) Weight: 261 lb 3.9 oz (118.5 kg) IBW/kg (Calculated) : 76.45  Vital Signs: Temp: 98.2 F (36.8 C) (11/14 0759) Temp Source: Oral (11/14 0759) BP: 127/75 (11/14 0759) Pulse Rate: 75 (11/14 0759)  Labs: Recent Labs    07/23/19 1520 07/23/19 1600 07/25/19 0034 07/26/19 0439  HGB 17.5*  --  14.7 16.6  HCT 53.4*  --  45.8 50.1  PLT 149*  --  166 114*  CREATININE  --  1.17 0.97 0.91    Estimated Creatinine Clearance: 126.7 mL/min (by C-G formula based on SCr of 0.91 mg/dL).   Medical History: Past Medical History:  Diagnosis Date  . Bone spur    left foot  . Chest pain   . COVID-19   . Diabetes mellitus type 2 in obese ([place]) 07/13/2013  . Diverticulosis   . Erectile dysfunction 01/15/2013  . Esophageal reflux 01/15/2013  . Fatty liver 07/04/2015  . GERD (gastroesophageal reflux disease)   . Hiatal hernia   . Hiatal hernia with gastroesophageal reflux 03/20/2014  . Hyperglycemia 07/13/2013  . Hyperplastic colon polyp   . Hypertension   . Internal hemorrhoids   . Mixed hyperlipidemia   . Murmur   . OSA (obstructive sleep apnea)    s/p UPPP  . Plantar fasciitis   . Prediabetes   . Reflux esophagitis   . Stomach ulcer    from PCP  . Urinary hesitancy 09/30/2015    Medications:  Medications Prior to Admission  Medication Sig Dispense Refill Last Dose  . acetaminophen (TYLENOL) 500 MG tablet Take 1 tablet (500 mg total) by mouth every 6 (six) hours as needed. 60 tablet 0 07/23/2019 at Unknown time  . albuterol (PROVENTIL HFA;VENTOLIN HFA) 108 (90 Base) MCG/ACT inhaler Inhale 2 puffs into the lungs every 6 (six) hours as needed for wheezing or shortness of breath. 1 Inhaler 0 07/23/2019 at Unknown time  . amLODipine (NORVASC) 5 MG tablet Take  1 tablet by mouth once daily 90 tablet 1 07/22/2019  . aspirin EC 81 MG tablet Take 81 mg by mouth daily.   07/22/2019  . benzonatate (TESSALON) 100 MG capsule Take 2 capsules (200 mg total) by mouth 2 (two) times daily as needed for cough. 16 capsule 0 07/23/2019 at Unknown time  . calcium carbonate (TUMS EX) 750 MG chewable tablet Chew 2 tablets by mouth daily as needed for heartburn.   07/22/2019  . diclofenac (VOLTAREN) 75 MG EC tablet Take 1 tablet (75 mg total) by mouth 2 (two) times daily. 60 tablet 2 07/22/2019  . fluticasone (FLONASE) 50 MCG/ACT nasal spray Place 2 sprays into both nostrils daily. 16 g 6 07/23/2019 at Unknown time  . meloxicam (MOBIC) 15 MG tablet Take 1 tablet (15 mg total) by mouth daily. 7 tablet 0 07/15/2019  . Multiple Vitamin (MULTIVITAMIN) tablet Take 1 tablet by mouth daily.   Past Week at Unknown time  . omeprazole (PRILOSEC) 40 MG capsule Take 40 mg by mouth daily.   Past Week at Unknown time  . ondansetron (ZOFRAN) 4 MG tablet Take 4 mg by mouth every 8 (eight) hours as needed for nausea or vomiting.   07/21/2019  . rosuvastatin (CRESTOR) 10 MG tablet Take 1 tablet by mouth once daily 30 tablet 6 07/23/2019 at Unknown time  .  tadalafil (CIALIS) 5 MG tablet Take 1 tablet (5 mg total) by mouth daily as needed for erectile dysfunction. 30 tablet 5 unk  . triamterene-hydrochlorothiazide (MAXZIDE-25) 37.5-25 MG tablet Take 1 tablet by mouth once daily 90 tablet 0 07/22/2019  . Vitamin D, Ergocalciferol, (DRISDOL) 1.25 MG (50000 UT) CAPS capsule TAKE 1 CAPSULE BY MOUTH ONCE A WEEK FOR  12  WEEKS (Patient taking differently: Take 50,000 Units by mouth every 7 (seven) days. ) 4 capsule 0 07/14/2019  . BAYER MICROLET LANCETS lancets Test blood sugars twice daily 100 each 0   . Blood Glucose Monitoring Suppl (CONTOUR NEXT MONITOR) w/Device KIT 1 kit by Does not apply route 2 (two) times daily. 1 kit 0   . glucose blood (CONTOUR NEXT TEST) test strip Test blood sugars twice  daily 100 each 0   . metFORMIN (GLUCOPHAGE) 500 MG tablet Take 1 tablet (500 mg total) by mouth daily with breakfast. (Patient not taking: Reported on 07/14/2019) 90 tablet 1 Not Taking at Unknown time  . pantoprazole (PROTONIX) 40 MG tablet Take 1 tablet (40 mg total) by mouth 2 (two) times daily. (Patient not taking: Reported on 07/24/2019) 30 tablet 3 Not Taking at Unknown time    Assessment: 27 YOM with COVID-19 pneumonia who presented with respiratory symptoms. Currently on prophylactic Lovenox but D-dimer has trended up significantly to >20. MD concerned for VTE. Pharmacy consulted to transition patient to treatment dose lovenox.   H/H wnl, Plt 166>114k. SCr wnl. Lower extremity dopplers ordered. Of note, patient received a dose of Lovenox 60 mg at 0946 this AM.   Goal of Therapy:  Anti-Xa level 0.6-1 units/ml 4hrs after LMWH dose given Monitor platelets by anticoagulation protocol: Yes   Plan:  -Start Lovenox 1 mg/kg (120 mg) twice daily. Will give additional Lovenox 60 mg now  -Monitor CBC, renal fx, and s/s of bleeding -F/u LE doppler results   Albertina Parr, PharmD., BCPS Clinical Pharmacist Clinical phone for 07/26/19 until 5pm: [phone]

## 2019-07-26 NOTE — Progress Notes (Signed)
Bilateral lower extremity venous duplex completed. Refer to "CV Proc" under chart review to view preliminary results.  07/26/2019 2:13 PM Kelby Aline., MHA, RVT, RDCS, RDMS

## 2019-07-26 NOTE — Progress Notes (Signed)
Bed in low locked position with call light in reach. Will continue to monitor patient.

## 2019-07-27 LAB — COMPREHENSIVE METABOLIC PANEL
ALT: 65 U/L — ABNORMAL HIGH (ref 0–44)
AST: 36 U/L (ref 15–41)
Albumin: 3.5 g/dL (ref 3.5–5.0)
Alkaline Phosphatase: 55 U/L (ref 38–126)
Anion gap: 13 (ref 5–15)
BUN: 21 mg/dL — ABNORMAL HIGH (ref 6–20)
CO2: 22 mmol/L (ref 22–32)
Calcium: 9.6 mg/dL (ref 8.9–10.3)
Chloride: 101 mmol/L (ref 98–111)
Creatinine, Ser: 0.9 mg/dL (ref 0.61–1.24)
GFR calc Af Amer: >60 mL/min (ref >60)
GFR calc non Af Amer: >60 mL/min (ref >60)
Glucose, Bld: 97 mg/dL (ref 70–99)
Potassium: 4.4 mmol/L (ref 3.5–5.1)
Sodium: 136 mmol/L (ref 135–145)
Total Bilirubin: 0.6 mg/dL (ref 0.3–1.2)
Total Protein: 7.4 g/dL (ref 6.5–8.1)

## 2019-07-27 LAB — GLUCOSE, CAPILLARY
Glucose-Capillary: 88 mg/dL (ref 70–99)
Glucose-Capillary: 80 mg/dL (ref 70–99)
Glucose-Capillary: 114 mg/dL — ABNORMAL HIGH (ref 70–99)
Glucose-Capillary: 122 mg/dL — ABNORMAL HIGH (ref 70–99)

## 2019-07-27 LAB — CBC WITH DIFFERENTIAL/PLATELET
Abs Immature Granulocytes: 0.05 10*3/uL (ref 0.00–0.07)
Basophils Absolute: 0 10*3/uL (ref 0.0–0.1)
Basophils Relative: 0 %
Eosinophils Absolute: 0 10*3/uL (ref 0.0–0.5)
Eosinophils Relative: 0 %
HCT: 49.8 % (ref 39.0–52.0)
Hemoglobin: 16.4 g/dL (ref 13.0–17.0)
Immature Granulocytes: 1 %
Lymphocytes Relative: 13 %
Lymphs Abs: 1.1 10*3/uL (ref 0.7–4.0)
MCH: 28.2 pg (ref 26.0–34.0)
MCHC: 32.9 g/dL (ref 30.0–36.0)
MCV: 85.6 fL (ref 80.0–100.0)
Monocytes Absolute: 0.5 10*3/uL (ref 0.1–1.0)
Monocytes Relative: 6 %
Neutro Abs: 6.4 10*3/uL (ref 1.7–7.7)
Neutrophils Relative %: 80 %
Platelets: 194 10*3/uL (ref 150–400)
RBC: 5.82 MIL/uL — ABNORMAL HIGH (ref 4.22–5.81)
RDW: 14.2 % (ref 11.5–15.5)
WBC: 8 10*3/uL (ref 4.0–10.5)
nRBC: 0 % (ref 0.0–0.2)

## 2019-07-27 LAB — D-DIMER, QUANTITATIVE: D-Dimer, Quant: 0.33 ug/mL-FEU (ref 0.00–0.50)

## 2019-07-27 LAB — MAGNESIUM: Magnesium: 2.2 mg/dL (ref 1.7–2.4)

## 2019-07-27 LAB — C-REACTIVE PROTEIN: CRP: 1.2 mg/dL — ABNORMAL HIGH (ref <1.0)

## 2019-07-27 MED ORDER — DIPHENHYDRAMINE HCL 50 MG/ML IJ SOLN
12.5000 mg | Freq: Once | INTRAMUSCULAR | Status: AC
  Administered 2019-07-27: 12.5 mg via INTRAVENOUS
  Filled 2019-07-27: qty 1

## 2019-07-27 MED ORDER — AMLODIPINE BESYLATE 5 MG PO TABS
10.0000 mg | ORAL_TABLET | Freq: Every day | ORAL | Status: DC
  Administered 2019-07-27 – 2019-07-28 (×2): 10 mg via ORAL
  Filled 2019-07-27 (×2): qty 2

## 2019-07-27 MED ORDER — SODIUM CHLORIDE 0.9 % IV SOLN: 12.5000 mg | Freq: Once | INTRAVENOUS | Status: DC

## 2019-07-27 MED ORDER — ENOXAPARIN SODIUM 60 MG/0.6ML ~~LOC~~ SOLN: 60.0000 mg | Freq: Once | SUBCUTANEOUS | Status: DC

## 2019-07-27 MED ORDER — DEXAMETHASONE 4 MG PO TABS
2.0000 mg | ORAL_TABLET | ORAL | Status: DC
  Administered 2019-07-27 – 2019-07-28 (×2): 2 mg via ORAL
  Filled 2019-07-27 (×2): qty 1

## 2019-07-27 MED ORDER — CARVEDILOL 3.125 MG PO TABS
6.2500 mg | ORAL_TABLET | Freq: Two times a day (BID) | ORAL | Status: DC
  Administered 2019-07-27 – 2019-07-28 (×3): 6.25 mg via ORAL
  Filled 2019-07-27 (×3): qty 2

## 2019-07-27 MED ORDER — PROMETHAZINE HCL 25 MG/ML IJ SOLN
25.0000 mg | Freq: Once | INTRAMUSCULAR | Status: AC
  Administered 2019-07-27: 25 mg via INTRAVENOUS
  Filled 2019-07-27: qty 1

## 2019-07-27 MED ORDER — ENOXAPARIN SODIUM 60 MG/0.6ML ~~LOC~~ SOLN
60.0000 mg | SUBCUTANEOUS | Status: DC
  Administered 2019-07-27 – 2019-07-28 (×2): 60 mg via SUBCUTANEOUS
  Filled 2019-07-27 (×2): qty 0.6

## 2019-07-27 MED ORDER — KETOROLAC TROMETHAMINE 30 MG/ML IJ SOLN
30.0000 mg | Freq: Once | INTRAMUSCULAR | Status: AC
  Administered 2019-07-27: 30 mg via INTRAVENOUS
  Filled 2019-07-27: qty 1

## 2019-07-27 NOTE — Progress Notes (Signed)
Patient continues to c/o headache. Refused am dose of hydralazine and tylenol for headache. Would like to talk with MD first. Patient would like to have a CT scan of MRI re: continued c/o headache.

## 2019-07-27 NOTE — Progress Notes (Signed)
PROGRESS NOTE                                                                                                                                                                                                             Patient Demographics:    [name] Foster, is a 53 y.o. male, DOB - 1966/06/16, YSH:[id number]  Outpatient Primary MD for the patient is [name], MD    LOS - 4  Admit date - 07/23/2019    Chief Complaint  Patient presents with   SOB HA Covid VD, CP       Brief Narrative  - [name] Foster is a 53 y.o. male with medical history significant of HTN, DM2 (diet controlled), PUD, GERD.  Patient diagnosed with COVID 11/6. Patient presents to the ED with progressively worsening cough, fatigue, fever, SOB.  Not helped by OTC meds at home.  Associated headache, muscle aches.  Symptoms constant & worsening. Had Covid PNA with hypoxia in the ER.   Subjective:   Patient in bed, appears comfortable, does have on and off generalized headaches for the last 7 to 10 days, had this problem in the past but had subsided for a few years, links it to hydralazine which I highly doubt, no photophobia or neck stiffness. No fever, no chest pain or pressure, no shortness of breath but does have a dry cough, no abdominal pain. No focal weakness.   Assessment  & Plan :     1. Acute Hypoxic Resp. Failure due to Acute Covid 19 Viral Pneumonitis during the ongoing 2020 Covid 19 Pandemic - has been started on IV Remdesivir and Steroids, clinically improving, advance activity, Niccoli improving and currently on room air will start tapering steroids.  D-dimer has been noted below.    COVID-19 Labs  Recent Labs    07/25/19 0034 07/26/19 0439 07/27/19 0245  DDIMER 0.35 >20.00* 0.33  CRP 3.1* 1.6* 1.2*    Lab Results  Component Value Date   SARSCOV2NAA DETECTED (A) 07/18/2019   [place] Not Detected 07/11/2019   [place] Not  Detected 02/10/2019     SpO2: 91 % O2 Flow Rate (L/min): 2 L/min  Hepatic Function Latest Ref Rng & Units 07/27/2019 07/26/2019 07/25/2019  Total Protein 6.5 - 8.1 g/dL 7.4 7.3 6.6  Albumin 3.5 - 5.0 g/dL 3.5 3.4(L) 3.2(L)  AST 15 - 41 U/L 36 34 38  ALT 0 - 44 U/L 65(H) 53(H) 58(H)  Alk Phosphatase 38 - 126 U/L 55 52 52  Total Bilirubin 0.3 - 1.2 mg/dL 0.6 0.7 0.5  Bilirubin, Direct 0.0 - 0.3 mg/dL - - -     No results found for: BNP    2.  HTN - on Norvasc, Coreg added and hydralazine stopped.  Patient thinks hydralazine is giving him headaches.  3.  H/O TB - treated decades ago.  4. GERD - PPI  5. Dyslipidemia - on statin.  6.  Extremely high D-dimer.  Lower extremity venous duplex negative, D-dimer was high only 1 day question if this was a error, cut down Lovenox to prophylaxis dose.    7. Ongoing intermittent headache for the last 7 to 10 days, way before coming to the hospital.  No photophobia or neck stiffness, he did have history of headaches in the past.  Question if this is a migraine type headache.  Will try combination of Toradol Benadryl and Phenergan.  Monitor.  No warning signs or further investigation.  Will monitor.    8.DM2 - good outpt control ISS and monitor.  Lab Results  Component Value Date   HGBA1C 6.3 (H) 07/24/2019   CBG (last 3)  Recent Labs    07/26/19 1625 07/26/19 1953 07/27/19 0802  GLUCAP 112* 174* 88      Condition - Fair  Family Communication  :  Wife 07/24/19 - 10.20 am > 20 rings on her cell - [phone] x 2  Code Status :  Full  Diet :   Diet Order            Diet Carb Modified Fluid consistency: Thin; Room service appropriate? Yes  Diet effective now               Disposition Plan  :  Home  Consults  :  None  Procedures  :     Lower extremity venous duplex ultrasound ordered 07/26/2019.  No DVT.  PUD Prophylaxis : PPI  DVT Prophylaxis  :  Lovenox    Lab Results  Component Value Date   PLT 194  07/27/2019    Inpatient Medications  Scheduled Meds:  albuterol  2 puff Inhalation Q6H   amLODipine  10 mg Oral Daily   aspirin EC  81 mg Oral Daily   carvedilol  6.25 mg Oral BID WC   dexamethasone  2 mg Oral Q24H   enoxaparin (LOVENOX) injection  60 mg Subcutaneous Q24H   insulin aspart  0-5 Units Subcutaneous QHS   insulin aspart  0-9 Units Subcutaneous TID WC   ketorolac  30 mg Intravenous Once   mouth rinse  15 mL Mouth Rinse BID   multivitamin with minerals  1 tablet Oral Daily   pantoprazole  80 mg Oral BID   promethazine  25 mg Intravenous Once   rosuvastatin  10 mg Oral q1800   triamterene-hydrochlorothiazide  1 tablet Oral Daily   Continuous Infusions:  sodium chloride 10 mL/hr at 07/24/19 1200   diphenhydrAMINE     remdesivir 100 mg in NS 250 mL Stopped (07/26/19 1100)   PRN Meds:.sodium chloride, acetaminophen, albuterol, benzonatate, guaiFENesin-dextromethorphan, ondansetron **OR** ondansetron (ZOFRAN) IV  Antibiotics  :    Anti-infectives (From admission, onward)   Start     Dose/Rate Route Frequency Ordered Stop   07/25/19 1000  remdesivir 100 mg in sodium chloride 0.9 % 250 mL IVPB  100 mg 500 mL/hr over 30 Minutes Intravenous Every 24 hours 07/24/19 0627 07/29/19 0959   07/24/19 0730  remdesivir 200 mg in sodium chloride 0.9 % 250 mL IVPB     200 mg 500 mL/hr over 30 Minutes Intravenous Once 07/24/19 4782 07/24/19 0923       Time Spent in minutes  30   Lala Lund M.D on 07/27/2019 at 8:48 AM  To page go to [email or web address] - password [facility]  [facility] -  Office  [phone]    See all Orders from today for further details    Objective:   Vitals:   07/27/19 0300 07/27/19 0400 07/27/19 0500 07/27/19 0758  BP:   121/79 134/86  Pulse: 78 79 79 72  Resp: (!) 22 (!) 21 (!) 21 18  Temp:   98.4 F (36.9 C) 98.8 F (37.1 C)  TempSrc:   Oral Oral  SpO2: 91% 90% 93% 91%  Weight:      Height:        Wt Readings  from Last 3 Encounters:  07/24/19 118.5 kg  07/19/19 118 kg  07/18/19 117.9 kg     Intake/Output Summary (Last 24 hours) at 07/27/2019 0848 Last data filed at 07/27/2019 0100 Gross per 24 hour  Intake 250 ml  Output 2100 ml  Net -1850 ml     Physical Exam  Awake Alert, Oriented X 3, No new F.N deficits, Normal affect [place].AT,PERRAL Supple Neck,No JVD, No cervical lymphadenopathy appriciated.  Symmetrical Chest wall movement, Good air movement bilaterally, CTAB RRR,No Gallops, Rubs or new Murmurs, No Parasternal Heave +ve B.Sounds, Abd Soft, No tenderness, No organomegaly appriciated, No rebound - guarding or rigidity. No Cyanosis, Clubbing or edema, No new Rash or bruise    Data Review:    CBC Recent Labs  Lab 07/23/19 1520 07/25/19 0034 07/26/19 0439 07/27/19 0245  WBC 3.9* 7.7 9.5 8.0  HGB 17.5* 14.7 16.6 16.4  HCT 53.4* 45.8 50.1 49.8  PLT 149* 166 114* 194  MCV 86.1 88.4 86.8 85.6  MCH 28.2 28.4 28.8 28.2  MCHC 32.8 32.1 33.1 32.9  RDW 14.4 14.2 14.3 14.2  LYMPHSABS  --  1.0 0.9 1.1  MONOABS  --  0.2 0.4 0.5  EOSABS  --  0.0 0.0 0.0  BASOSABS  --  0.0 0.0 0.0    Chemistries  Recent Labs  Lab 07/23/19 1600 07/25/19 0034 07/26/19 0439 07/27/19 0245  NA 133* 135 136 136  K 3.8 4.1 4.6 4.4  CL 99 101 102 101  CO2 24 22 20* 22  GLUCOSE 108* 121* 108* 97  BUN '13 18 18 ' 21*  CREATININE 1.17 0.97 0.91 0.90  CALCIUM 8.2* 9.1 9.3 9.6  MG  --  1.9 2.1 2.2  AST 35 38 34 36  ALT 46* 58* 53* 65*  ALKPHOS 59 52 52 55  BILITOT 0.4 0.5 0.7 0.6   ------------------------------------------------------------------------------------------------------------------ No results for input(s): CHOL, HDL, LDLCALC, TRIG, CHOLHDL, LDLDIRECT in the last 72 hours.  Lab Results  Component Value Date   HGBA1C 6.3 (H) 07/24/2019   ------------------------------------------------------------------------------------------------------------------ No results for input(s):  TSH, T4TOTAL, T3FREE, THYROIDAB in the last 72 hours.  Invalid input(s): FREET3  Cardiac Enzymes No results for input(s): CKMB, TROPONINI, MYOGLOBIN in the last 168 hours.  Invalid input(s): CK ------------------------------------------------------------------------------------------------------------------ No results found for: BNP  Micro Results Recent Results (from the past 240 hour(s))  Novel Coronavirus, NAA (Hosp order, Send-out to Ref Lab; TAT 18-24 hrs  Status: Abnormal   Collection Time: 07/18/19 10:07 AM   Specimen: Nasopharyngeal Swab; Respiratory  Result Value Ref Range Status   SARS-CoV-2, NAA DETECTED (A) NOT DETECTED Final    Comment: CRITICAL RESULT CALLED TO, READ BACK BY AND VERIFIED WITH:  LORI BERDIK [id number] FCP (NOTE)                  Client Requested Flag This nucleic acid amplification test was developed and its performance characteristics determined by [employer]. Nucleic acid amplification tests include PCR and TMA. This test has not been FDA cleared or approved. This test has been authorized by FDA under an Emergency Use Authorization (EUA). This test is only authorized for the duration of time the declaration that circumstances exist justifying the authorization of the emergency use of in vitro diagnostic tests for detection of SARS-CoV-2 virus and/or diagnosis of COVID-19 infection under section 564(b)(1) of the Act, 21 U.S.C. 109NAT-5(T) (1), unless the authorization is terminated or revoked sooner. When diagnostic testing is negative, the possibility of a false negative result should be considered in the context of a patient's recent exposures and the presence of clinica l signs and symptoms consistent with COVID-19. An individual without symptoms of COVID- 19 and who is not shedding SARS-CoV-2 virus would expect to have a negative (not detected) result in this assay. Performed At: [facility] [place], [place] [id number] Rush Farmer MD HC:[id number] Performed at [facility], [place] [address], [place], [place] [address]    Coronavirus Source NASAL SWAB  Corrected    Comment: Performed at [facility], [address] [place], [place] [address] CORRECTED ON 11/06 AT 1023: PREVIOUSLY REPORTED AS NASOPHARYNGEAL   Urine culture     Status: None   Collection Time: 07/23/19  3:20 PM   Specimen: Urine, Clean Catch  Result Value Ref Range Status   Specimen Description   Final    URINE, CLEAN CATCH Performed at [facility], [address] [place], [place] [address]    Special Requests   Final    NONE Performed at [facility], [address] [place], [place] [address]    Culture   Final    NO GROWTH Performed at [facility], [place] [address] [place], [place] [address]    Report Status 07/25/2019 FINAL  Final  Blood Culture (routine x 2)     Status: None (Preliminary result)   Collection Time: 07/23/19  3:45 PM   Specimen: BLOOD  Result Value Ref Range Status   Specimen Description   Final    BLOOD BLOOD Performed at [facility], [address] [place], [place] [address]    Special Requests   Final    BOTTLES DRAWN AEROBIC AND ANAEROBIC Blood Culture adequate volume Performed at [facility], [address] [place], [place] [address]    Culture   Final    NO GROWTH 3 DAYS Performed at [facility], [place] [address] [place], [place] [address]    Report Status PENDING  Incomplete    Radiology Reports Dg Chest Portable 1 View  Result Date: 07/23/2019 CLINICAL DATA:  Shortness of breath, vomiting, and diarrhea. COVID-19. Fever. Chest pain and abdominal pain. Headache. EXAM: PORTABLE CHEST 1 VIEW COMPARISON:  07/18/2019 and 11/26/2018 FINDINGS: There is a new faint infiltrate at the right lung base as well as a new small  patchy area of infiltrate and atelectasis in the left mid and lower lung  zones. Heart size and pulmonary vascularity are normal. No discrete effusions. No acute bone abnormality. IMPRESSION: 1. New small areas of infiltrate in both lungs. 2. No other significant abnormalities. Electronically Signed   By: Lorriane Shire M.D.   On: 07/23/2019 15:15   Dg Chest Port 1 View  Result Date: 07/18/2019 CLINICAL DATA:  Chest pain and shortness of breath. EXAM: PORTABLE CHEST 1 VIEW COMPARISON:  November 26, 2018 FINDINGS: The lungs are clear. Heart is mildly enlarged with pulmonary vascularity normal. No adenopathy. No bone lesions. IMPRESSION: Mild cardiomegaly.  No edema or consolidation. Electronically Signed   By: Lowella Grip III M.D.   On: 07/18/2019 11:07   Leg [place]  Result Date: 07/26/2019  Lower Venous Study Indications: Elevated d-dimer.  Comparison Study: No prior study Performing Technologist: Maudry Mayhew MHA, RDMS, RVT, RDCS  Examination Guidelines: A complete evaluation includes B-mode imaging, spectral Doppler, color Doppler, and power Doppler as needed of all accessible portions of each vessel. Bilateral testing is considered an integral part of a complete examination. Limited examinations for reoccurring indications may be performed as noted.  +---------+---------------+---------+-----------+----------+--------------+  RIGHT     Compressibility Phasicity Spontaneity Properties Thrombus Aging  +---------+---------------+---------+-----------+----------+--------------+  CFV       Full            Yes       Yes                                    +---------+---------------+---------+-----------+----------+--------------+  SFJ       Full                                                             +---------+---------------+---------+-----------+----------+--------------+  FV Prox   Full                                                             +---------+---------------+---------+-----------+----------+--------------+  FV Mid    Full                                                              +---------+---------------+---------+-----------+----------+--------------+  FV Distal Full                                                             +---------+---------------+---------+-----------+----------+--------------+  PFV       Full                                                             +---------+---------------+---------+-----------+----------+--------------+  POP       Full            Yes       Yes                                    +---------+---------------+---------+-----------+----------+--------------+  PTV       Full                                                             +---------+---------------+---------+-----------+----------+--------------+  PERO      Full                                                             +---------+---------------+---------+-----------+----------+--------------+  +---------+---------------+---------+-----------+----------+--------------+  LEFT      Compressibility Phasicity Spontaneity Properties Thrombus Aging  +---------+---------------+---------+-----------+----------+--------------+  CFV       Full            Yes       Yes                                    +---------+---------------+---------+-----------+----------+--------------+  SFJ       Full                                                             +---------+---------------+---------+-----------+----------+--------------+  FV Prox   Full                                                             +---------+---------------+---------+-----------+----------+--------------+  FV Mid    Full                                                             +---------+---------------+---------+-----------+----------+--------------+  FV Distal Full                                                             +---------+---------------+---------+-----------+----------+--------------+  PFV       Full                                                              +---------+---------------+---------+-----------+----------+--------------+  POP       Full            Yes       Yes                                    +---------+---------------+---------+-----------+----------+--------------+  PTV       Full                                                             +---------+---------------+---------+-----------+----------+--------------+  PERO      Full                                                             +---------+---------------+---------+-----------+----------+--------------+  Summary: Right: There is no evidence of deep vein thrombosis in the lower extremity. No cystic structure found in the popliteal fossa. Left: There is no evidence of deep vein thrombosis in the lower extremity. No cystic structure found in the popliteal fossa.  *See table(s) above for measurements and observations.    Preliminary

## 2019-07-28 LAB — CBC WITH DIFFERENTIAL/PLATELET
Abs Immature Granulocytes: 0.05 10*3/uL (ref 0.00–0.07)
Basophils Absolute: 0 10*3/uL (ref 0.0–0.1)
Basophils Relative: 1 %
Eosinophils Absolute: 0 10*3/uL (ref 0.0–0.5)
Eosinophils Relative: 0 %
HCT: 50.5 % (ref 39.0–52.0)
Hemoglobin: 16.6 g/dL (ref 13.0–17.0)
Immature Granulocytes: 1 %
Lymphocytes Relative: 25 %
Lymphs Abs: 1.6 10*3/uL (ref 0.7–4.0)
MCH: 28.4 pg (ref 26.0–34.0)
MCHC: 32.9 g/dL (ref 30.0–36.0)
MCV: 86.5 fL (ref 80.0–100.0)
Monocytes Absolute: 0.3 10*3/uL (ref 0.1–1.0)
Monocytes Relative: 5 %
Neutro Abs: 4.3 10*3/uL (ref 1.7–7.7)
Neutrophils Relative %: 68 %
Platelets: 201 10*3/uL (ref 150–400)
RBC: 5.84 MIL/uL — ABNORMAL HIGH (ref 4.22–5.81)
RDW: 14.2 % (ref 11.5–15.5)
WBC: 6.3 10*3/uL (ref 4.0–10.5)
nRBC: 0 % (ref 0.0–0.2)

## 2019-07-28 LAB — CULTURE, BLOOD (ROUTINE X 2)
Culture: NO GROWTH
Special Requests: ADEQUATE

## 2019-07-28 LAB — GLUCOSE, CAPILLARY: Glucose-Capillary: 77 mg/dL (ref 70–99)

## 2019-07-28 LAB — COMPREHENSIVE METABOLIC PANEL
ALT: 90 U/L — ABNORMAL HIGH (ref 0–44)
AST: 44 U/L — ABNORMAL HIGH (ref 15–41)
Albumin: 3.9 g/dL (ref 3.5–5.0)
Alkaline Phosphatase: 53 U/L (ref 38–126)
Anion gap: 13 (ref 5–15)
BUN: 29 mg/dL — ABNORMAL HIGH (ref 6–20)
CO2: 21 mmol/L — ABNORMAL LOW (ref 22–32)
Calcium: 9.2 mg/dL (ref 8.9–10.3)
Chloride: 103 mmol/L (ref 98–111)
Creatinine, Ser: 1.13 mg/dL (ref 0.61–1.24)
GFR calc Af Amer: >60 mL/min (ref >60)
GFR calc non Af Amer: >60 mL/min (ref >60)
Glucose, Bld: 90 mg/dL (ref 70–99)
Potassium: 4.6 mmol/L (ref 3.5–5.1)
Sodium: 137 mmol/L (ref 135–145)
Total Bilirubin: 0.9 mg/dL (ref 0.3–1.2)
Total Protein: 7.8 g/dL (ref 6.5–8.1)

## 2019-07-28 LAB — MAGNESIUM: Magnesium: 2.4 mg/dL (ref 1.7–2.4)

## 2019-07-28 LAB — C-REACTIVE PROTEIN: CRP: <0.8 mg/dL (ref <1.0)

## 2019-07-28 LAB — D-DIMER, QUANTITATIVE: D-Dimer, Quant: 0.36 ug/mL-FEU (ref 0.00–0.50)

## 2019-07-28 MED ORDER — GUAIFENESIN-DM 100-10 MG/5ML PO SYRP: 5.0000 mL | ORAL_SOLUTION | Freq: Three times a day (TID) | ORAL | 0 refills | Status: DC | PRN

## 2019-07-28 MED ORDER — AMLODIPINE BESYLATE 10 MG PO TABS: 10.0000 mg | ORAL_TABLET | Freq: Every day | ORAL | 0 refills | Status: DC

## 2019-07-28 MED ORDER — CARVEDILOL 6.25 MG PO TABS: 6.2500 mg | ORAL_TABLET | Freq: Two times a day (BID) | ORAL | 0 refills | Status: DC

## 2019-07-28 NOTE — Progress Notes (Signed)
Pt's spouse called to talk with nurse re: pt's condition.  Pt's spouse relayed to nurse that pt had stated to her that he doesn't feel quite right, ready to come home after last dose of Remdesivir.  Pt's spouse also mentioned she felt he sounded to breathy/winded and not talking much d/t this.    Nurse explained what it means when a pt has COVID/PN.  Discussed what things pt should do even upon returning home, using IS and Flutter valve; explained the value of what each does to assist pt recovering from PN/COVID in the long run.  Also explained that pt will still be recovering once he return homes, which may take up 3 to 6 months to get back to what he felt like before getting sick with COVID.  Pt's spouse stated her understanding and appreciation of of this information.

## 2019-07-28 NOTE — Discharge Instructions (Signed)
Follow with Primary MD [name], MD in 7 days   Get CBC, CMP, 2 view Chest X ray -  checked next visit within 1 week by Primary MD    Activity: As tolerated with Full fall precautions use walker/cane & assistance as needed  Disposition Home    Diet: Heart Healthy Low Carb.  Special Instructions: If you have smoked or chewed Tobacco  in the last 2 yrs please stop smoking, stop any regular Alcohol  and or any Recreational drug use.  On your next visit with your primary care physician please Get Medicines reviewed and adjusted.  Please request your Prim.MD to go over all Hospital Tests and Procedure/Radiological results at the follow up, please get all Hospital records sent to your Prim MD by signing hospital release before you go home.  If you experience worsening of your admission symptoms, develop shortness of breath, life threatening emergency, suicidal or homicidal thoughts you must seek medical attention immediately by calling 911 or calling your MD immediately  if symptoms less severe.  You Must read complete instructions/literature along with all the possible adverse reactions/side effects for all the Medicines you take and that have been prescribed to you. Take any new Medicines after you have completely understood and accpet all the possible adverse reactions/side effects.       Person Under Monitoring Name: [name] Foster  Location: [id number] [place] [address]   Infection Prevention Recommendations for Individuals Confirmed to have, or Being Evaluated for, 2019 Novel Coronavirus (COVID-19) Infection Who Receive Care at Home  Individuals who are confirmed to have, or are being evaluated for, COVID-19 should follow the prevention steps below until a healthcare provider or local or state health department says they can return to normal activities.  Stay home except to get medical care You should restrict activities outside your home, except for getting medical  care. Do not go to work, school, or public areas, and do not use public transportation or taxis.  Call ahead before visiting your doctor Before your medical appointment, call the healthcare provider and tell them that you have, or are being evaluated for, COVID-19 infection. This will help the healthcare providers office take steps to keep other people from getting infected. Ask your healthcare provider to call the local or state health department.  Monitor your symptoms Seek prompt medical attention if your illness is worsening (e.g., difficulty breathing). Before going to your medical appointment, call the healthcare provider and tell them that you have, or are being evaluated for, COVID-19 infection. Ask your healthcare provider to call the local or state health department.  Wear a facemask You should wear a facemask that covers your nose and mouth when you are in the same room with other people and when you visit a healthcare provider. People who live with or visit you should also wear a facemask while they are in the same room with you.  Separate yourself from other people in your home As much as possible, you should stay in a different room from other people in your home. Also, you should use a separate bathroom, if available.  Avoid sharing household items You should not share dishes, drinking glasses, cups, eating utensils, towels, bedding, or other items with other people in your home. After using these items, you should wash them thoroughly with soap and water.  Cover your coughs and sneezes Cover your mouth and nose with a tissue when you cough or sneeze, or you can  cough or sneeze into your sleeve. Throw used tissues in a lined trash can, and immediately wash your hands with soap and water for at least 20 seconds or use an alcohol-based hand rub.  Wash your [facility] your hands often and thoroughly with soap and water for at least 20 seconds. You can use an alcohol-based  hand sanitizer if soap and water are not available and if your hands are not visibly dirty. Avoid touching your eyes, nose, and mouth with unwashed hands.   Prevention Steps for Caregivers and Household Members of Individuals Confirmed to have, or Being Evaluated for, COVID-19 Infection Being Cared for in the Home  If you live with, or provide care at home for, a person confirmed to have, or being evaluated for, COVID-19 infection please follow these guidelines to prevent infection:  Follow healthcare providers instructions Make sure that you understand and can help the patient follow any healthcare provider instructions for all care.  Provide for the patients basic needs You should help the patient with basic needs in the home and provide support for getting groceries, prescriptions, and other personal needs.  Monitor the patients symptoms If they are getting sicker, call his or her medical provider and tell them that the patient has, or is being evaluated for, COVID-19 infection. This will help the healthcare providers office take steps to keep other people from getting infected. Ask the healthcare provider to call the local or state health department.  Limit the number of people who have contact with the patient  If possible, have only one caregiver for the patient.  Other household members should stay in another home or place of residence. If this is not possible, they should stay  in another room, or be separated from the patient as much as possible. Use a separate bathroom, if available.  Restrict visitors who do not have an essential need to be in the home.  Keep older adults, very young children, and other sick people away from the patient Keep older adults, very young children, and those who have compromised immune systems or chronic health conditions away from the patient. This includes people with chronic heart, lung, or kidney conditions, diabetes, and  cancer.  Ensure good ventilation Make sure that shared spaces in the home have good air flow, such as from an air conditioner or an opened window, weather permitting.  Wash your hands often  Wash your hands often and thoroughly with soap and water for at least 20 seconds. You can use an alcohol based hand sanitizer if soap and water are not available and if your hands are not visibly dirty.  Avoid touching your eyes, nose, and mouth with unwashed hands.  Use disposable paper towels to dry your hands. If not available, use dedicated cloth towels and replace them when they become wet.  Wear a facemask and gloves  Wear a disposable facemask at all times in the room and gloves when you touch or have contact with the patients blood, body fluids, and/or secretions or excretions, such as sweat, saliva, sputum, nasal mucus, vomit, urine, or feces.  Ensure the mask fits over your nose and mouth tightly, and do not touch it during use.  Throw out disposable facemasks and gloves after using them. Do not reuse.  Wash your hands immediately after removing your facemask and gloves.  If your personal clothing becomes contaminated, carefully remove clothing and launder. Wash your hands after handling contaminated clothing.  Place all used disposable facemasks, gloves, and  other waste in a lined container before disposing them with other household waste.  Remove gloves and wash your hands immediately after handling these items.  Do not share dishes, glasses, or other household items with the patient  Avoid sharing household items. You should not share dishes, drinking glasses, cups, eating utensils, towels, bedding, or other items with a patient who is confirmed to have, or being evaluated for, COVID-19 infection.  After the person uses these items, you should wash them thoroughly with soap and water.  Wash laundry thoroughly  Immediately remove and wash clothes or bedding that have blood, body  fluids, and/or secretions or excretions, such as sweat, saliva, sputum, nasal mucus, vomit, urine, or feces, on them.  Wear gloves when handling laundry from the patient.  Read and follow directions on labels of laundry or clothing items and detergent. In general, wash and dry with the warmest temperatures recommended on the label.  Clean all areas the individual has used often  Clean all touchable surfaces, such as counters, tabletops, doorknobs, bathroom fixtures, toilets, phones, keyboards, tablets, and bedside tables, every day. Also, clean any surfaces that may have blood, body fluids, and/or secretions or excretions on them.  Wear gloves when cleaning surfaces the patient has come in contact with.  Use a diluted bleach solution (e.g., dilute bleach with 1 part bleach and 10 parts water) or a household disinfectant with a label that says EPA-registered for coronaviruses. To make a bleach solution at home, add 1 tablespoon of bleach to 1 quart (4 cups) of water. For a larger supply, add  cup of bleach to 1 gallon (16 cups) of water.  Read labels of cleaning products and follow recommendations provided on product labels. Labels contain instructions for safe and effective use of the cleaning product including precautions you should take when applying the product, such as wearing gloves or eye protection and making sure you have good ventilation during use of the product.  Remove gloves and wash hands immediately after cleaning.  Monitor yourself for signs and symptoms of illness Caregivers and household members are considered close contacts, should monitor their health, and will be asked to limit movement outside of the home to the extent possible. Follow the monitoring steps for close contacts listed on the symptom monitoring form.   ? If you have additional questions, contact your local health department or call the epidemiologist on call at [phone] (available 24/7). ? This  guidance is subject to change. For the most up-to-date guidance from [facility], please refer to their website: [email or web address]

## 2019-07-28 NOTE — Discharge Summary (Signed)
[name] Foster ZWC:[id number] DOB: August 12, 1966 DOA: 07/23/2019  PCP: [name], MD  Admit date: 07/23/2019  Discharge date: 07/28/2019  Admitted From: Home   Disposition:  Home   Recommendations for Outpatient Follow-up:   Follow up with PCP in 1-2 weeks  PCP Please obtain BMP/CBC, 2 view CXR in 1week,  (see Discharge instructions)   PCP Please follow up on the following pending results: Check CBC and CMP in 7 to 10 days.   Home Health: None   Equipment/Devices: None  Consultations: None  Discharge Condition: Stable    CODE STATUS: Full    Diet Recommendation: Heart Healthy Low Carb    Chief Complaint  Patient presents with  . SOB HA Covid VD, CP     Brief history of present illness from the day of admission and additional interim summary    [name] E Fateis a 53 y.o.malewith medical history significant ofHTN, DM2 (diet controlled), PUD, GERD.  Patient diagnosed with COVID 11/6. Patient presents to the ED with progressively worsening cough, fatigue, fever, SOB. Not helped by OTC meds at home. Associated headache, muscle aches. Symptoms constant & worsening. Had Covid PNA with hypoxia in the ER.                                                                 Hospital Course    1. Acute Hypoxic Resp. Failure due to Acute Covid 19 Viral Pneumonitis during the ongoing 2020 Covid 19 Pandemic - he was treated with IV steroids and remdesivir has finished his course, completely symptom-free on room air for 2 days.  Will be discharged home with outpatient PCP follow-up.  Had mild Covid related transaminitis with slightly elevated ALT.  Request PCP to check CMP in 7 to 10 days.   2.  HTN - on Norvasc dose increased, added  Coreg with home dose ACE inhibitor and HCTZ.  PCP to monitor and adjust  3.  H/O  TB - treated decades ago.  4. GERD - PPI  5. Dyslipidemia - on statin.  6.  Extremely high D-dimer.  Lower extremity venous duplex negative, D-dimer was high only 1 day question if this was a error, cut down Lovenox to prophylaxis dose.    7. Ongoing intermittent headache for the last 7 to 10 days, way before coming to the hospital.    Resolved after 1 dose of Toradol.    8.DM2 - good outpt control , continue home regimen.  R.  Lab Results  Component Value Date   HGBA1C 6.3 (H) 07/24/2019     Discharge diagnosis     Principal Problem:   Pneumonia due to COVID-19 virus Active Problems:   Hyperlipidemia   Essential hypertension   Type 2 diabetes mellitus without complication, without long-term current use of insulin ([place])  Acute respiratory failure with hypoxia [facility])    Discharge instructions    Discharge Instructions    Discharge instructions   Complete by: As directed    Follow with Primary MD [name], MD in 7 days   Get CBC, CMP, 2 view Chest X ray -  checked next visit within 1 week by Primary MD    Activity: As tolerated with Full fall precautions use walker/cane & assistance as needed  Disposition Home    Diet: Heart Healthy Low Carb.  Special Instructions: If you have smoked or chewed Tobacco  in the last 2 yrs please stop smoking, stop any regular Alcohol  and or any Recreational drug use.  On your next visit with your primary care physician please Get Medicines reviewed and adjusted.  Please request your Prim.MD to go over all Hospital Tests and Procedure/Radiological results at the follow up, please get all Hospital records sent to your Prim MD by signing hospital release before you go home.  If you experience worsening of your admission symptoms, develop shortness of breath, life threatening emergency, suicidal or homicidal thoughts you must seek medical attention immediately by calling 911 or calling your MD immediately  if symptoms less  severe.  You Must read complete instructions/literature along with all the possible adverse reactions/side effects for all the Medicines you take and that have been prescribed to you. Take any new Medicines after you have completely understood and accpet all the possible adverse reactions/side effects.   Increase activity slowly   Complete by: As directed    MyChart COVID-19 home monitoring program   Complete by: Jul 28, 2019    Is the patient willing to use the [place] for home monitoring?: Yes   Temperature monitoring   Complete by: Jul 28, 2019    After how many days would you like to receive a notification of this patient's flowsheet entries?: 1      Discharge Medications   Allergies as of 07/28/2019      Reactions   Hydrocodone Itching   Tramadol Itching      Medication List    STOP taking these medications   meloxicam 15 MG tablet Commonly known as: Mobic   pantoprazole 40 MG tablet Commonly known as: PROTONIX     TAKE these medications   acetaminophen 500 MG tablet Commonly known as: TYLENOL Take 1 tablet (500 mg total) by mouth every 6 (six) hours as needed.   albuterol 108 (90 Base) MCG/ACT inhaler Commonly known as: VENTOLIN HFA Inhale 2 puffs into the lungs every 6 (six) hours as needed for wheezing or shortness of breath.   amLODipine 10 MG tablet Commonly known as: NORVASC Take 1 tablet (10 mg total) by mouth daily. What changed:   medication strength  how much to take   aspirin EC 81 MG tablet Take 81 mg by mouth daily.   Bayer Microlet Lancets lancets Test blood sugars twice daily   benzonatate 100 MG capsule Commonly known as: TESSALON Take 2 capsules (200 mg total) by mouth 2 (two) times daily as needed for cough.   calcium carbonate 750 MG chewable tablet Commonly known as: TUMS EX Chew 2 tablets by mouth daily as needed for heartburn.   carvedilol 6.25 MG tablet Commonly known as: Coreg Take 1 tablet (6.25 mg total) by  mouth 2 (two) times daily.   Contour Next Monitor w/Device Kit 1 kit by Does not apply route 2 (two) times daily.   diclofenac 75  MG EC tablet Commonly known as: VOLTAREN Take 1 tablet (75 mg total) by mouth 2 (two) times daily.   fluticasone 50 MCG/ACT nasal spray Commonly known as: FLONASE Place 2 sprays into both nostrils daily.   glucose blood test strip Commonly known as: Contour Next Test Test blood sugars twice daily   guaiFENesin-dextromethorphan 100-10 MG/5ML syrup Commonly known as: ROBITUSSIN DM Take 5 mLs by mouth every 8 (eight) hours as needed for cough.   metFORMIN 500 MG tablet Commonly known as: GLUCOPHAGE Take 1 tablet (500 mg total) by mouth daily with breakfast.   multivitamin tablet Take 1 tablet by mouth daily.   omeprazole 40 MG capsule Commonly known as: PRILOSEC Take 40 mg by mouth daily.   ondansetron 4 MG tablet Commonly known as: ZOFRAN Take 4 mg by mouth every 8 (eight) hours as needed for nausea or vomiting.   rosuvastatin 10 MG tablet Commonly known as: CRESTOR Take 1 tablet by mouth once daily   tadalafil 5 MG tablet Commonly known as: Cialis Take 1 tablet (5 mg total) by mouth daily as needed for erectile dysfunction.   triamterene-hydrochlorothiazide 37.5-25 MG tablet Commonly known as: MAXZIDE-25 Take 1 tablet by mouth once daily   Vitamin D (Ergocalciferol) 1.25 MG (50000 UT) Caps capsule Commonly known as: DRISDOL TAKE 1 CAPSULE BY MOUTH ONCE A WEEK FOR  12  WEEKS What changed: See the new instructions.       Follow-up Information    [name], MD. Schedule an appointment as soon as possible for a visit in 1 week(s).   Specialty: Family Medicine Contact information: [address] [place] [address] [phone]           Major procedures and Radiology Reports - PLEASE review detailed and final reports thoroughly  -       Dg Chest Portable 1 View  Result Date: 07/23/2019 CLINICAL  DATA:  Shortness of breath, vomiting, and diarrhea. COVID-19. Fever. Chest pain and abdominal pain. Headache. EXAM: PORTABLE CHEST 1 VIEW COMPARISON:  07/18/2019 and 11/26/2018 FINDINGS: There is a new faint infiltrate at the right lung base as well as a new small patchy area of infiltrate and atelectasis in the left mid and lower lung zones. Heart size and pulmonary vascularity are normal. No discrete effusions. No acute bone abnormality. IMPRESSION: 1. New small areas of infiltrate in both lungs. 2. No other significant abnormalities. Electronically Signed   By: Lorriane Shire M.D.   On: 07/23/2019 15:15   Dg Chest Port 1 View  Result Date: 07/18/2019 CLINICAL DATA:  Chest pain and shortness of breath. EXAM: PORTABLE CHEST 1 VIEW COMPARISON:  November 26, 2018 FINDINGS: The lungs are clear. Heart is mildly enlarged with pulmonary vascularity normal. No adenopathy. No bone lesions. IMPRESSION: Mild cardiomegaly.  No edema or consolidation. Electronically Signed   By: Lowella Grip III M.D.   On: 07/18/2019 11:07   Leg [place]  Result Date: 07/27/2019  Lower Venous Study Indications: Elevated d-dimer.  Comparison Study: No prior study Performing Technologist: Maudry Mayhew MHA, RDMS, RVT, RDCS  Examination Guidelines: A complete evaluation includes B-mode imaging, spectral Doppler, color Doppler, and power Doppler as needed of all accessible portions of each vessel. Bilateral testing is considered an integral part of a complete examination. Limited examinations for reoccurring indications may be performed as noted.  +---------+---------------+---------+-----------+----------+--------------+ RIGHT    CompressibilityPhasicitySpontaneityPropertiesThrombus Aging +---------+---------------+---------+-----------+----------+--------------+ CFV      Full  Yes      Yes                                 +---------+---------------+---------+-----------+----------+--------------+ SFJ       Full                                                        +---------+---------------+---------+-----------+----------+--------------+ FV Prox  Full                                                        +---------+---------------+---------+-----------+----------+--------------+ FV Mid   Full                                                        +---------+---------------+---------+-----------+----------+--------------+ FV DistalFull                                                        +---------+---------------+---------+-----------+----------+--------------+ PFV      Full                                                        +---------+---------------+---------+-----------+----------+--------------+ POP      Full           Yes      Yes                                 +---------+---------------+---------+-----------+----------+--------------+ PTV      Full                                                        +---------+---------------+---------+-----------+----------+--------------+ PERO     Full                                                        +---------+---------------+---------+-----------+----------+--------------+  +---------+---------------+---------+-----------+----------+--------------+ LEFT     CompressibilityPhasicitySpontaneityPropertiesThrombus Aging +---------+---------------+---------+-----------+----------+--------------+ CFV      Full           Yes      Yes                                 +---------+---------------+---------+-----------+----------+--------------+ SFJ  Full                                                        +---------+---------------+---------+-----------+----------+--------------+ FV Prox  Full                                                        +---------+---------------+---------+-----------+----------+--------------+ FV Mid   Full                                                         +---------+---------------+---------+-----------+----------+--------------+ FV DistalFull                                                        +---------+---------------+---------+-----------+----------+--------------+ PFV      Full                                                        +---------+---------------+---------+-----------+----------+--------------+ POP      Full           Yes      Yes                                 +---------+---------------+---------+-----------+----------+--------------+ PTV      Full                                                        +---------+---------------+---------+-----------+----------+--------------+ PERO     Full                                                        +---------+---------------+---------+-----------+----------+--------------+  Summary: Right: There is no evidence of deep vein thrombosis in the lower extremity. No cystic structure found in the popliteal fossa. Left: There is no evidence of deep vein thrombosis in the lower extremity. No cystic structure found in the popliteal fossa.  *See table(s) above for measurements and observations. Electronically signed by Monica Martinez MD on 07/27/2019 at 10:39:27 AM.    Final     Micro Results     Recent Results (from the past 240 hour(s))  Novel Coronavirus, NAA (Hosp order, Send-out to Ref Lab; TAT 18-24 hrs     Status: Abnormal   Collection Time: 07/18/19 10:07 AM   Specimen: Nasopharyngeal Swab; Respiratory  Result  Value Ref Range Status   SARS-CoV-2, NAA DETECTED (A) NOT DETECTED Final    Comment: CRITICAL RESULT CALLED TO, READ BACK BY AND VERIFIED WITH:  LORI BERDIK [id number] [id number] FCP (NOTE)                  Client Requested Flag This nucleic acid amplification test was developed and its performance characteristics determined by [employer]. Nucleic acid amplification tests include PCR and TMA. This test has not been FDA  cleared or approved. This test has been authorized by FDA under an Emergency Use Authorization (EUA). This test is only authorized for the duration of time the declaration that circumstances exist justifying the authorization of the emergency use of in vitro diagnostic tests for detection of SARS-CoV-2 virus and/or diagnosis of COVID-19 infection under section 564(b)(1) of the Act, 21 U.S.C. 749SWH-6(P) (1), unless the authorization is terminated or revoked sooner. When diagnostic testing is negative, the possibility of a false negative result should be considered in the context of a patient's recent exposures and the presence of clinica l signs and symptoms consistent with COVID-19. An individual without symptoms of COVID- 19 and who is not shedding SARS-CoV-2 virus would expect to have a negative (not detected) result in this assay. Performed At: [facility] [place], [place] [id number] Rush Farmer MD ZL:[id number] Performed at [facility], [place] [address], [place], [place] [address]    Coronavirus Source NASAL SWAB  Corrected    Comment: Performed at [facility], [address] [place], [place] [address] CORRECTED ON 11/06 AT 1023: PREVIOUSLY REPORTED AS NASOPHARYNGEAL   Urine culture     Status: None   Collection Time: 07/23/19  3:20 PM   Specimen: Urine, Clean Catch  Result Value Ref Range Status   Specimen Description   Final    URINE, CLEAN CATCH Performed at [facility], [address] [place], [place] [address]    Special Requests   Final    NONE Performed at [facility], [address], [place], [place] [address]    Culture   Final    NO GROWTH Performed at [facility], [place] [address] [place], [place] [address]    Report Status 07/25/2019 FINAL  Final  Blood Culture (routine x 2)     Status: None (Preliminary result)   Collection Time: 07/23/19  3:45 PM   Specimen: BLOOD  Result Value  Ref Range Status   Specimen Description   Final    BLOOD BLOOD Performed at [facility], [address] [place], [place] [address]    Special Requests   Final    BOTTLES DRAWN AEROBIC AND ANAEROBIC Blood Culture adequate volume Performed at [facility], [address] [place], [place] [address]    Culture   Final    NO GROWTH 4 DAYS Performed at [facility], [place] [address], [place],  [address]    Report Status PENDING  Incomplete    Today   Subjective    Siddiq Geeting today has no headache,no chest abdominal pain,no new weakness tingling or numbness, feels much better wants to go home today.     Objective   Blood pressure 130/87, pulse 71, temperature 98 F (36.7 C), temperature source Oral, resp. rate (!) 21, height 5' 11.5" (1.816 m), weight 118.5 kg, SpO2 92 %.   Intake/Output Summary (Last 24 hours) at  07/28/2019 0935 Last data filed at 07/28/2019 0000 Gross per 24 hour  Intake 240 ml  Output 1525 ml  Net -1285 ml    Exam Awake Alert, Oriented x 3, No new F.N deficits, Normal affect [place].AT,PERRAL Supple Neck,No JVD, No cervical lymphadenopathy appriciated.  Symmetrical Chest wall movement, Good air movement bilaterally, CTAB RRR,No Gallops,Rubs or new Murmurs, No Parasternal Heave +ve B.Sounds, Abd Soft, Non tender, No organomegaly appriciated, No rebound -guarding or rigidity. No Cyanosis, Clubbing or edema, No new Rash or bruise   Data Review   CBC w Diff:  Lab Results  Component Value Date   WBC 6.3 07/28/2019   HGB 16.6 07/28/2019   HGB 15.8 11/27/2016   HCT 50.5 07/28/2019   HCT 46.5 11/27/2016   PLT 201 07/28/2019   LYMPHOPCT 25 07/28/2019   MONOPCT 5 07/28/2019   EOSPCT 0 07/28/2019   BASOPCT 1 07/28/2019    CMP:  Lab Results  Component Value Date   NA 137 07/28/2019   NA 141 11/27/2016   K 4.6 07/28/2019   CL 103 07/28/2019   CO2 21 (L) 07/28/2019   BUN 29 (H) 07/28/2019   BUN 17 11/27/2016    CREATININE 1.13 07/28/2019   CREATININE 1.08 05/05/2014   PROT 7.8 07/28/2019   PROT 7.4 11/27/2016   ALBUMIN 3.9 07/28/2019   ALBUMIN 4.6 11/27/2016   BILITOT 0.9 07/28/2019   BILITOT 0.4 11/27/2016   ALKPHOS 53 07/28/2019   AST 44 (H) 07/28/2019   ALT 90 (H) 07/28/2019  .   Total Time in preparing paper work, data evaluation and todays exam - 18 minutes  Lala Lund M.D on 07/28/2019 at 9:35 AM  [facility]   Office  [phone]

## 2019-07-29 ENCOUNTER — Telehealth: Payer: Self-pay | Admitting: *Deleted

## 2019-07-29 LAB — GLUCOSE, CAPILLARY: Glucose-Capillary: 103 mg/dL — ABNORMAL HIGH (ref 70–99)

## 2019-07-29 NOTE — Telephone Encounter (Signed)
LVM for pt to schedule appt 

## 2019-07-30 NOTE — Telephone Encounter (Signed)
Unable to reach pt. 2nd attempt. 

## 2019-07-31 NOTE — Telephone Encounter (Signed)
Unable to reach pt. 3rd attempt. Hospital follow up scheduled 08/01/19.

## 2019-08-01 ENCOUNTER — Encounter: Payer: Self-pay | Admitting: Family Medicine

## 2019-08-01 ENCOUNTER — Ambulatory Visit (INDEPENDENT_AMBULATORY_CARE_PROVIDER_SITE_OTHER): Payer: Commercial Managed Care - PPO | Admitting: Family Medicine

## 2019-08-01 ENCOUNTER — Other Ambulatory Visit: Payer: Self-pay

## 2019-08-01 VITALS — BP 129/76 | Wt 245.0 lb

## 2019-08-01 DIAGNOSIS — E119 Type 2 diabetes mellitus without complications: Secondary | ICD-10-CM

## 2019-08-01 DIAGNOSIS — U071 COVID-19: Secondary | ICD-10-CM

## 2019-08-01 DIAGNOSIS — J1289 Other viral pneumonia: Secondary | ICD-10-CM

## 2019-08-01 DIAGNOSIS — I10 Essential (primary) hypertension: Secondary | ICD-10-CM

## 2019-08-01 DIAGNOSIS — M341 CR(E)ST syndrome: Secondary | ICD-10-CM

## 2019-08-01 MED ORDER — BAYER MICROLET LANCETS MISC: 0 refills | Status: AC

## 2019-08-01 MED ORDER — PROMETHAZINE HCL 25 MG RE SUPP: 25.0000 mg | Freq: Four times a day (QID) | RECTAL | 0 refills | Status: DC | PRN

## 2019-08-01 MED ORDER — FAMOTIDINE 20 MG PO TABS: 20.0000 mg | ORAL_TABLET | Freq: Two times a day (BID) | ORAL | 3 refills | Status: DC

## 2019-08-01 MED ORDER — ASPIRIN EC 81 MG PO TBEC: 81.0000 mg | DELAYED_RELEASE_TABLET | Freq: Two times a day (BID) | ORAL | 3 refills | Status: DC

## 2019-08-01 MED ORDER — SPACER/AERO-HOLD CHAMBER BAGS MISC: 1 refills | Status: DC

## 2019-08-01 MED ORDER — ALBUTEROL SULFATE HFA 108 (90 BASE) MCG/ACT IN AERS: 1.0000 | INHALATION_SPRAY | Freq: Four times a day (QID) | RESPIRATORY_TRACT | 3 refills | Status: DC | PRN

## 2019-08-01 MED ORDER — MULTI-VITAMIN/MINERALS PO TABS: 1.0000 | ORAL_TABLET | Freq: Every day | ORAL | 5 refills | Status: DC

## 2019-08-01 MED ORDER — PULSE OXIMETER FOR FINGER MISC: 1.0000 | 0 refills | Status: AC | PRN

## 2019-08-01 MED ORDER — CONTOUR NEXT TEST VI STRP: ORAL_STRIP | 0 refills | Status: DC

## 2019-08-01 MED ORDER — THERMOMETER MISC: 1.0000 | 0 refills | Status: DC | PRN

## 2019-08-01 NOTE — Assessment & Plan Note (Signed)
Well controlled, no changes to meds. Encouraged heart healthy diet such as the DASH diet and exercise as tolerated.  °

## 2019-08-01 NOTE — Progress Notes (Signed)
Virtual Visit via Video Note  I connected with [name] Foster on 08/01/19 at  9:20 AM EST by a video enabled telemedicine application and verified that I am speaking with the correct person using two identifiers.  Location: Patient: home Provider: home   I discussed the limitations of evaluation and management by telemedicine and the availability of in person appointments. The patient expressed understanding and agreed to proceed. Magdalene Molly, CMA was able to get the patient set up on a video visit    Subjective:    Patient ID: [name] Foster, male    DOB: Nov 25, 1965, 53 y.o.   MRN: [id number]  No chief complaint on file.   HPI Patient is in today for hospital follow up. He is home recovering from [place] after being admitted to [facility] from 11/11 to 11/16. He notes his headaches, nausea, vomiting and diarrhea have resolved. He is still struggling with chills, myalgias, weakness and productive cough. His nights are difficult and he has trouble breathing and resting at night. He has been using his wife's Albuterol several times a day with temporary improved. He is trying to hydrate but is not eating great. He notes he is resting and sitting most of the time. No headache or diarrhea at this time.   Past Medical History:  Diagnosis Date   Bone spur    left foot   Chest pain    COVID-19    Diabetes mellitus type 2 in obese ([place]) 07/13/2013   Diverticulosis    Erectile dysfunction 01/15/2013   Esophageal reflux 01/15/2013   Fatty liver 07/04/2015   GERD (gastroesophageal reflux disease)    Hiatal hernia    Hiatal hernia with gastroesophageal reflux 03/20/2014   Hyperglycemia 07/13/2013   Hyperplastic colon polyp    Hypertension    Internal hemorrhoids    Mixed hyperlipidemia    Murmur    OSA (obstructive sleep apnea)    s/p UPPP   Plantar fasciitis    Prediabetes    Reflux esophagitis    Stomach ulcer    from PCP   Urinary hesitancy 09/30/2015    Past Surgical  History:  Procedure Laterality Date   24 HOUR [place] STUDY N/A 02/28/2016   Procedure: 24 HOUR [place] STUDY;  Surgeon: Jerene Bears, MD;  Location: [facility];  Service: Gastroenterology;  Laterality: N/A;   ESOPHAGEAL MANOMETRY N/A 09/01/2013   Procedure: ESOPHAGEAL MANOMETRY (EM);  Surgeon: Sable Feil, MD;  Location: [facility];  Service: Endoscopy;  Laterality: N/A;   ESOPHAGEAL MANOMETRY N/A 02/28/2016   Procedure: ESOPHAGEAL MANOMETRY (EM);  Surgeon: Jerene Bears, MD;  [facility];  Service: Gastroenterology;  Laterality: N/A;   INGUINAL HERNIA REPAIR     TONSILLECTOMY     UMBILICAL HERNIA REPAIR     UVULECTOMY      Family History  Problem Relation Age of Onset   Dementia Mother    Diabetes Mother    Hypertension Mother    Heart failure Mother    Stroke Mother    Obesity Mother    Hypertension Other        siblings   Diabetes Sister    Cancer Brother        lung cancer smoker   Heart attack Maternal Grandmother    Diabetes Sister    Pancreatic disease Sister    Obesity Father    Colon cancer Neg Hx     Social History   Socioeconomic History   Marital status: Married  Spouse name: [name]   Number of children: Not on file   Years of education: Not on file   Highest education level: Not on file  Occupational History   Occupation: Nature conservation officer: [employer] resource strain: Not on file   Food insecurity    Worry: Not on file    Inability: Not on file   Transportation needs    Medical: Not on file    Non-medical: Not on file  Tobacco Use   Smoking status: Never Smoker   Smokeless tobacco: Never Used  Substance and Sexual Activity   Alcohol use: No   Drug use: No   Sexual activity: Yes    Partners: Female  Lifestyle   Physical activity    Days per week: Not on file    Minutes per session: Not on file   Stress: Not on file  Relationships   Social  connections    Talks on phone: Not on file    Gets together: Not on file    Attends religious service: Not on file    Active member of club or organization: Not on file    Attends meetings of clubs or organizations: Not on file    Relationship status: Not on file   Intimate partner violence    Fear of current or ex partner: Not on file    Emotionally abused: Not on file    Physically abused: Not on file    Forced sexual activity: Not on file  Other Topics Concern   Not on file  Social History Narrative   Tobacco Use - No.    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 area   Married - 13 years   Alcohol Use - no   Regular Exercise - yes   Drug Use - no   3 girls    3 boys   Smoking Status:      Packs/Day:     Caffeine use/day:  1 cup coffee every other day   Does Patient Exercise:  no    Outpatient Medications Prior to Visit  Medication Sig Dispense Refill   acetaminophen (TYLENOL) 500 MG tablet Take 1 tablet (500 mg total) by mouth every 6 (six) hours as needed. 60 tablet 0   albuterol (PROVENTIL HFA;VENTOLIN HFA) 108 (90 Base) MCG/ACT inhaler Inhale 2 puffs into the lungs every 6 (six) hours as needed for wheezing or shortness of breath. 1 Inhaler 0   amLODipine (NORVASC) 10 MG tablet Take 1 tablet (10 mg total) by mouth daily. 30 tablet 0   benzonatate (TESSALON) 100 MG capsule Take 2 capsules (200 mg total) by mouth 2 (two) times daily as needed for cough. 16 capsule 0   Blood Glucose Monitoring Suppl (CONTOUR NEXT MONITOR) w/Device KIT 1 kit by Does not apply route 2 (two) times daily. 1 kit 0   calcium carbonate (TUMS EX) 750 MG chewable tablet Chew 2 tablets by mouth daily as needed for heartburn.     carvedilol (COREG) 6.25 MG tablet Take 1 tablet (6.25 mg total) by mouth 2 (two) times daily. 60 tablet 0   diclofenac (VOLTAREN) 75 MG EC tablet Take 1 tablet (75 mg total) by mouth 2 (two) times daily. 60 tablet 2   fluticasone  (FLONASE) 50 MCG/ACT nasal spray Place 2 sprays into both nostrils daily. 16 g 6   guaiFENesin-dextromethorphan (ROBITUSSIN DM) 100-10 MG/5ML syrup Take  5 mLs by mouth every 8 (eight) hours as needed for cough. 118 mL 0   metFORMIN (GLUCOPHAGE) 500 MG tablet Take 1 tablet (500 mg total) by mouth daily with breakfast. (Patient not taking: Reported on 07/14/2019) 90 tablet 1   Multiple Vitamin (MULTIVITAMIN) tablet Take 1 tablet by mouth daily.     omeprazole (PRILOSEC) 40 MG capsule Take 40 mg by mouth daily.     ondansetron (ZOFRAN) 4 MG tablet Take 4 mg by mouth every 8 (eight) hours as needed for nausea or vomiting.     rosuvastatin (CRESTOR) 10 MG tablet Take 1 tablet by mouth once daily 30 tablet 6   tadalafil (CIALIS) 5 MG tablet Take 1 tablet (5 mg total) by mouth daily as needed for erectile dysfunction. 30 tablet 5   triamterene-hydrochlorothiazide (MAXZIDE-25) 37.5-25 MG tablet Take 1 tablet by mouth once daily 90 tablet 0   Vitamin D, Ergocalciferol, (DRISDOL) 1.25 MG (50000 UT) CAPS capsule TAKE 1 CAPSULE BY MOUTH ONCE A WEEK FOR  12  WEEKS (Patient taking differently: Take 50,000 Units by mouth every 7 (seven) days. ) 4 capsule 0   aspirin EC 81 MG tablet Take 81 mg by mouth daily.     BAYER MICROLET LANCETS lancets Test blood sugars twice daily 100 each 0   glucose blood (CONTOUR NEXT TEST) test strip Test blood sugars twice daily 100 each 0   No facility-administered medications prior to visit.     Allergies  Allergen Reactions   Hydrocodone Itching   Tramadol Itching    Review of Systems  Constitutional: Positive for malaise/fatigue. Negative for fever.  HENT: Negative for congestion.   Eyes: Negative for blurred vision.  Respiratory: Positive for cough and shortness of breath.   Cardiovascular: Negative for chest pain, palpitations and leg swelling.  Gastrointestinal: Negative for abdominal pain, blood in stool and nausea.  Genitourinary: Negative for  dysuria and frequency.  Musculoskeletal: Positive for myalgias. Negative for falls.  Skin: Negative for rash.  Neurological: Positive for weakness. Negative for dizziness, loss of consciousness and headaches.  Endo/Heme/Allergies: Negative for environmental allergies.  Psychiatric/Behavioral: Negative for depression. The patient has insomnia. The patient is not nervous/anxious.        Objective:    Physical Exam Constitutional:      Appearance: Normal appearance. He is not ill-appearing.  HENT:     Head: Normocephalic and atraumatic.     Nose: Nose normal.  Eyes:     General:        Right eye: No discharge.        Left eye: No discharge.  Pulmonary:     Effort: Pulmonary effort is normal.  Neurological:     Mental Status: He is alert and oriented to person, place, and time.  Psychiatric:        Mood and Affect: Mood normal.        Behavior: Behavior normal.     BP 129/76 (BP Location: Left Arm, Patient Position: Sitting, Cuff Size: Large)    Wt 245 lb (111.1 kg)    BMI 33.69 kg/m  Wt Readings from Last 3 Encounters:  08/01/19 245 lb (111.1 kg)  07/24/19 261 lb 3.9 oz (118.5 kg)  07/19/19 260 lb 2.3 oz (118 kg)    Diabetic Foot Exam - Simple   No data filed     Lab Results  Component Value Date   WBC 6.3 07/28/2019   HGB 16.6 07/28/2019   HCT 50.5 07/28/2019   PLT  201 07/28/2019   GLUCOSE 90 07/28/2019   CHOL 207 (H) 10/14/2018   TRIG 132 07/23/2019   HDL 47.20 10/14/2018   LDLDIRECT 162.0 01/10/2018   LDLCALC 148 (H) 10/14/2018   ALT 90 (H) 07/28/2019   AST 44 (H) 07/28/2019   NA 137 07/28/2019   K 4.6 07/28/2019   CL 103 07/28/2019   CREATININE 1.13 07/28/2019   BUN 29 (H) 07/28/2019   CO2 21 (L) 07/28/2019   TSH 1.54 10/14/2018   PSA 2.39 09/21/2016   HGBA1C 6.3 (H) 07/24/2019    Lab Results  Component Value Date   TSH 1.54 10/14/2018   Lab Results  Component Value Date   WBC 6.3 07/28/2019   HGB 16.6 07/28/2019   HCT 50.5 07/28/2019    MCV 86.5 07/28/2019   PLT 201 07/28/2019   Lab Results  Component Value Date   NA 137 07/28/2019   K 4.6 07/28/2019   CO2 21 (L) 07/28/2019   GLUCOSE 90 07/28/2019   BUN 29 (H) 07/28/2019   CREATININE 1.13 07/28/2019   BILITOT 0.9 07/28/2019   ALKPHOS 53 07/28/2019   AST 44 (H) 07/28/2019   ALT 90 (H) 07/28/2019   PROT 7.8 07/28/2019   ALBUMIN 3.9 07/28/2019   CALCIUM 9.2 07/28/2019   ANIONGAP 13 07/28/2019   GFR 106.29 10/14/2018   Lab Results  Component Value Date   CHOL 207 (H) 10/14/2018   Lab Results  Component Value Date   HDL 47.20 10/14/2018   Lab Results  Component Value Date   LDLCALC 148 (H) 10/14/2018   Lab Results  Component Value Date   TRIG 132 07/23/2019   Lab Results  Component Value Date   CHOLHDL 4 10/14/2018   Lab Results  Component Value Date   HGBA1C 6.3 (H) 07/24/2019       Assessment & Plan:   Problem List Items Addressed This Visit    Essential hypertension (Chronic)    Well controlled, no changes to meds. Encouraged heart healthy diet such as the DASH diet and exercise as tolerated.       Relevant Medications   aspirin EC 81 MG tablet   Type 2 diabetes mellitus without complication, without long-term current use of insulin ([name]) (Chronic)    hgba1c acceptable, minimize simple carbs. Increase exercise as tolerated. Continue current meds      Relevant Medications   aspirin EC 81 MG tablet   glucose blood (CONTOUR NEXT TEST) test strip   Pneumonia due to COVID-19 virus    He is home recovering from [place] after being admitted to [facility] from 11/11 to 11/16. He notes his headaches, nausea, vomiting and diarrhea have resolved. He is still struggling with chills, myalgias, weakness and productive cough. His nights are difficult and he has trouble breathing and resting at night. He does not have a pulse oximeter so he agrees to get one. He has been usinghis wife's albuterol so a prescription is sent in for this and a spacer as well as one  for a thermometer, pulse oximeter. He takes aspirin 81 mg daily he will increase to bid and he should increase Famotidine to 20 mg bid and he will take Vit C 1000 bid, Vit D 1000 IU daily, Zinc 50 mg daily and a multivitamin with minerals daily. Launched the covid monitoring platform in epic and he will seek care if he worsens. F/u in 1-2 weeks. Encouraged to do his incentive spirometer at least 3 x a day and to rest  but also get up and walk around the house as able on the hour during the day.       Relevant Medications   promethazine (PHENERGAN) 25 MG suppository   albuterol (VENTOLIN HFA) 108 (90 Base) MCG/ACT inhaler    Other Visit Diagnoses    COVID-19 virus detected    -  Primary   Relevant Orders   MYCHART COVID-19 HOME MONITORING PROGRAM   Diabetes mellitus without complication ([place])       Relevant Medications   aspirin EC 81 MG tablet   Bayer Microlet Lancets lancets      I have changed [name] Foster's aspirin EC. I am also having him start on famotidine, Pulse Oximeter For Finger, Thermometer, promethazine, multivitamin with minerals, albuterol, and Spacer/Aero-Hold Chamber Bags. Additionally, I am having him maintain his multivitamin, acetaminophen, Contour Next Monitor, albuterol, tadalafil, fluticasone, metFORMIN, rosuvastatin, ondansetron, Vitamin D (Ergocalciferol), triamterene-hydrochlorothiazide, diclofenac, benzonatate, omeprazole, calcium carbonate, amLODipine, carvedilol, guaiFENesin-dextromethorphan, Bayer Microlet Lancets, and Contour Next Test.  Meds ordered this encounter  Medications   famotidine (PEPCID) 20 MG tablet    Sig: Take 1 tablet (20 mg total) by mouth 2 (two) times daily.    Dispense:  60 tablet    Refill:  3   Misc. Devices (PULSE OXIMETER FOR FINGER) MISC    Sig: 1 Device by Does not apply route as needed (SOB, fatigue, headache patient with COVID).    Dispense:  1 each    Refill:  0   Thermometer MISC    Sig: 1 Device by Does not apply route as  needed. Patient with covid    Dispense:  1 Device    Refill:  0   promethazine (PHENERGAN) 25 MG suppository    Sig: Place 1 suppository (25 mg total) rectally every 6 (six) hours as needed for nausea or vomiting.    Dispense:  12 each    Refill:  0   Multiple Vitamins-Minerals (MULTIVITAMIN WITH MINERALS) tablet    Sig: Take 1 tablet by mouth daily.    Dispense:  100 tablet    Refill:  5   aspirin EC 81 MG tablet    Sig: Take 1 tablet (81 mg total) by mouth 2 (two) times daily.    Dispense:  60 tablet    Refill:  3   albuterol (VENTOLIN HFA) 108 (90 Base) MCG/ACT inhaler    Sig: Inhale 1-2 puffs into the lungs every 6 (six) hours as needed for wheezing or shortness of breath.    Dispense:  18 g    Refill:  3   Bayer Microlet Lancets lancets    Sig: Test blood sugars twice daily    Dispense:  100 each    Refill:  0    OK to sub with whatever insurance covers.   glucose blood (CONTOUR NEXT TEST) test strip    Sig: Test blood sugars twice daily    Dispense:  100 each    Refill:  0   Spacer/Aero-Hold Chamber Bags MISC    Sig: Use as directed with inhaler    Dispense:  1 each    Refill:  1      I discussed the assessment and treatment plan with the patient. The patient was provided an opportunity to ask questions and all were answered. The patient agreed with the plan and demonstrated an understanding of the instructions.   The patient was advised to call back or seek an in-person evaluation if the symptoms worsen or if  the condition fails to improve as anticipated.  I provided 25 minutes of non-face-to-face time during this encounter.   Penni Homans, MD

## 2019-08-01 NOTE — Assessment & Plan Note (Addendum)
He is home recovering from [place] after being admitted to [facility] from 11/11 to 11/16. He notes his headaches, nausea, vomiting and diarrhea have resolved. He is still struggling with chills, myalgias, weakness and productive cough. His nights are difficult and he has trouble breathing and resting at night. He does not have a pulse oximeter so he agrees to get one. He has been using his wife's albuterol so a prescription is sent in for this and a spacer as well as one for a thermometer, pulse oximeter. He takes aspirin 81 mg daily he will increase to bid and he should increase Famotidine to 20 mg bid and he will take Vit C 1000 bid, Vit D 1000 IU daily, Zinc 50 mg daily and a multivitamin with minerals daily. Launched the covid monitoring platform in epic and he will seek care if he worsens. F/u in 1-2 weeks. Encouraged to do his incentive spirometer at least 3 x a day and to rest but also get up and walk around the house as able on the hour during the day.

## 2019-08-01 NOTE — Assessment & Plan Note (Signed)
hgba1c acceptable, minimize simple carbs. Increase exercise as tolerated. Continue current meds 

## 2019-08-04 ENCOUNTER — Ambulatory Visit: Payer: Commercial Managed Care - PPO | Admitting: Podiatry

## 2019-08-07 ENCOUNTER — Encounter (INDEPENDENT_AMBULATORY_CARE_PROVIDER_SITE_OTHER): Payer: Self-pay

## 2019-08-14 ENCOUNTER — Ambulatory Visit (INDEPENDENT_AMBULATORY_CARE_PROVIDER_SITE_OTHER): Payer: Commercial Managed Care - PPO | Admitting: Podiatry

## 2019-08-14 ENCOUNTER — Other Ambulatory Visit: Payer: Self-pay

## 2019-08-14 ENCOUNTER — Ambulatory Visit: Payer: Commercial Managed Care - PPO | Admitting: Podiatry

## 2019-08-14 ENCOUNTER — Ambulatory Visit (INDEPENDENT_AMBULATORY_CARE_PROVIDER_SITE_OTHER): Payer: Commercial Managed Care - PPO

## 2019-08-14 ENCOUNTER — Encounter: Payer: Self-pay | Admitting: Podiatry

## 2019-08-14 DIAGNOSIS — Z9889 Other specified postprocedural states: Secondary | ICD-10-CM

## 2019-08-14 DIAGNOSIS — M7661 Achilles tendinitis, right leg: Secondary | ICD-10-CM | POA: Diagnosis not present

## 2019-08-14 DIAGNOSIS — M722 Plantar fascial fibromatosis: Secondary | ICD-10-CM

## 2019-08-14 NOTE — Progress Notes (Signed)
Subjective:   Patient ID: [name] Foster, male   DOB: 53 y.o.   MRN: [id number]   HPI Patient presents stating that the back of his heel seems to be doing relatively well but he is having a lot of problems with the bottom of both heels and states that seems to be ongoing.  He did have Covid and spent 5 days in the hospital   ROS      Objective:  Physical Exam  Neurovascular status intact with patient found to have exquisite discomfort plantar aspect heel region bilateral with inflammation fluid around the medial band bilateral with well-healed surgical site posterior right heel     Assessment:  Acute fasciitis bilateral with posterior inflammation right     Plan:  H&P reviewed conditions and today for the posterior heel I am very pleased with how it is done on for the plantar heel bilateral I did sterile prep and injected the plantar fascia 3 mg Kenalog 5 mg Xylocaine and advised on supportive therapy.  Reappoint for [place] to recheck  X-rays right do indicate satisfactory resection of bone good alignment noted

## 2019-08-21 ENCOUNTER — Encounter: Payer: Self-pay | Admitting: *Deleted

## 2019-08-27 ENCOUNTER — Ambulatory Visit: Payer: Commercial Managed Care - PPO | Admitting: Internal Medicine

## 2019-09-04 ENCOUNTER — Other Ambulatory Visit: Payer: Self-pay | Admitting: Internal Medicine

## 2019-09-09 ENCOUNTER — Telehealth: Payer: Self-pay | Admitting: *Deleted

## 2019-09-09 DIAGNOSIS — N529 Male erectile dysfunction, unspecified: Secondary | ICD-10-CM

## 2019-09-09 MED ORDER — TADALAFIL 5 MG PO TABS: 5.0000 mg | ORAL_TABLET | Freq: Every day | ORAL | 2 refills | Status: DC | PRN

## 2019-09-09 NOTE — Telephone Encounter (Signed)
Received fax from [place] [address] for Cialis refill. Refill sent.

## 2019-09-10 ENCOUNTER — Telehealth: Payer: Self-pay | Admitting: Podiatry

## 2019-09-10 ENCOUNTER — Ambulatory Visit (INDEPENDENT_AMBULATORY_CARE_PROVIDER_SITE_OTHER): Payer: Commercial Managed Care - PPO | Admitting: Family Medicine

## 2019-09-10 ENCOUNTER — Other Ambulatory Visit: Payer: Self-pay

## 2019-09-10 ENCOUNTER — Encounter: Payer: Self-pay | Admitting: Family Medicine

## 2019-09-10 DIAGNOSIS — M545 Low back pain, unspecified: Secondary | ICD-10-CM

## 2019-09-10 DIAGNOSIS — G8929 Other chronic pain: Secondary | ICD-10-CM

## 2019-09-10 NOTE — Progress Notes (Signed)
Office Visit Note   Patient: [name] Foster           Date of Birth: 07/15/1966           MRN: [id number] Visit Date: 09/10/2019 Requested by: [name], MD [address] [place],  [place] [address] PCP: [name], MD  Subjective: Chief Complaint  Patient presents with  . Lower Back - Pain    Still having pain in the lower back, especially with bending down at the waist (like with getting dressed in the morning).     HPI: He is here with persistent chronic low back pain.  He has been through physical therapy in the past with minimal change in symptoms.  Pain is in the midline lower thoracic/upper lumbar spine, worse when bending forward.  No significant radicular symptoms.  We were planning to refer him for an epidural injection in November, but he became sick with COVID-19 and was hospitalized for 5 days.  He has fully recovered with no long-term issues.  He is not taking anything specifically for his back right now.              ROS: No fevers or chills.  All other systems were reviewed and are negative.  Objective: Vital Signs: There were no vitals taken for this visit.  Physical Exam:  General:  Alert and oriented, in no acute distress. Pulm:  Breathing unlabored. Psy:  Normal mood, congruent affect. Skin: No rash on the skin. Low back: He has tenderness in the paraspinous muscles mainly in the upper lumbar spine.  Negative straight leg raise, lower extremity strength and reflexes are still normal.  Imaging: None today.  Assessment & Plan: 1.  Chronic low back pain -Discussed options with him and elected to try chiropractic per Dr. Mel Almond in [facility]. -If he fails to improve he will contact me and I will request referral to Dr. Ernestina Patches.     Procedures: No procedures performed  No notes on file     PMFS History: Patient Active Problem List   Diagnosis Date Noted  . Acute respiratory failure with hypoxia ([place]) 07/24/2019  . Pneumonia due  to COVID-19 virus 07/23/2019  . Bilateral inguinal hernia without obstruction or gangrene 04/11/2017  . Umbilical hernia without obstruction and without gangrene 04/11/2017  . Vitamin D deficiency 01/24/2017  . Type 2 diabetes mellitus without complication, without long-term current use of insulin ([place]) 01/24/2017  . Depression 01/24/2017  . Shortness of breath on exertion 11/27/2016  . Internal hemorrhoids   . Atypical chest pain   . Gastroesophageal reflux disease without esophagitis   . Dyspnea 01/18/2016  . Preventative health care 10/10/2015  . Sleep apnea 10/10/2015  . Urinary hesitancy 09/30/2015  . Fatty liver 07/04/2015  . Edema 05/07/2014  . Scleroderma of esophagus ([place]) 03/20/2014  . Hiatal hernia with gastroesophageal reflux 03/20/2014  . Anxiety state, unspecified 07/13/2013  . Erectile dysfunction 01/15/2013  . Back pain 03/09/2012  . 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 Heel pain, bilateral 04/25/2010  . Obesity 01/05/2010  . Hyperlipidemia 10/04/2009  . Essential hypertension 10/04/2009   Past Medical History:  Diagnosis Date  . Bone spur    left foot  . Chest pain   . COVID-19   . Diabetes mellitus type 2 in obese ([place]) 07/13/2013  . Diverticulosis   . Erectile dysfunction 01/15/2013  . Esophageal reflux 01/15/2013  . Fatty liver 07/04/2015  . GERD (gastroesophageal reflux disease)   .  Hiatal hernia   . Hiatal hernia with gastroesophageal reflux 03/20/2014  . Hyperglycemia 07/13/2013  . Hyperplastic colon polyp   . Hypertension   . Internal hemorrhoids   . Mixed hyperlipidemia   . Murmur   . OSA (obstructive sleep apnea)    s/p UPPP  . Plantar fasciitis   . Pneumonia due to COVID-19 virus   . Prediabetes   . Reflux esophagitis   . Stomach ulcer    from PCP  . Urinary hesitancy 09/30/2015    Family History  Problem Relation Age of Onset  . Dementia Mother   . Diabetes Mother   . Hypertension Mother   . Heart failure Mother   . Stroke  Mother   . Obesity Mother   . Hypertension Other        siblings  . Diabetes Sister   . Cancer Brother        lung cancer smoker  . Heart attack Maternal Grandmother   . Diabetes Sister   . Pancreatic disease Sister   . Obesity Father   . Colon cancer Neg Hx     Past Surgical History:  Procedure Laterality Date  . [place] STUDY N/A 02/28/2016   Procedure: [place] STUDY;  Surgeon: Jerene Bears, MD;  [facility]: [facility];  Service: Gastroenterology;  Laterality: N/A;  . ESOPHAGEAL MANOMETRY N/A 09/01/2013   Procedure: ESOPHAGEAL MANOMETRY (EM);  Surgeon: Sable Feil, MD;  Location: [facility];  Service: Endoscopy;  Laterality: N/A;  . ESOPHAGEAL MANOMETRY N/A 02/28/2016   Procedure: ESOPHAGEAL MANOMETRY (EM);  Surgeon: Jerene Bears, MD;  [facility]: [facility];  Service: Gastroenterology;  Laterality: N/A;  . INGUINAL HERNIA REPAIR    . TONSILLECTOMY    . UMBILICAL HERNIA REPAIR    . UVULECTOMY     Social History   Occupational History  . Occupation: Nature conservation officer: [employer]  Tobacco Use  . Smoking status: Never Smoker  . Smokeless tobacco: Never Used  Substance and Sexual Activity  . Alcohol use: No  . Drug use: No  . Sexual activity: Yes    Partners: Female

## 2019-09-10 NOTE — Telephone Encounter (Signed)
Called pt to let him know his requested medical records are ready and at the front desk for him to pick up at his convenience.

## 2019-10-07 ENCOUNTER — Ambulatory Visit: Payer: Commercial Managed Care - PPO | Admitting: Internal Medicine

## 2019-10-09 ENCOUNTER — Encounter: Payer: Self-pay | Admitting: Podiatry

## 2019-10-09 ENCOUNTER — Other Ambulatory Visit: Payer: Self-pay

## 2019-10-09 ENCOUNTER — Telehealth: Payer: Self-pay | Admitting: Podiatry

## 2019-10-09 ENCOUNTER — Ambulatory Visit (INDEPENDENT_AMBULATORY_CARE_PROVIDER_SITE_OTHER): Payer: Commercial Managed Care - PPO

## 2019-10-09 ENCOUNTER — Ambulatory Visit (INDEPENDENT_AMBULATORY_CARE_PROVIDER_SITE_OTHER): Payer: Commercial Managed Care - PPO | Admitting: Podiatry

## 2019-10-09 VITALS — Temp 96.1°F

## 2019-10-09 DIAGNOSIS — M7661 Achilles tendinitis, right leg: Secondary | ICD-10-CM | POA: Diagnosis not present

## 2019-10-09 DIAGNOSIS — M722 Plantar fascial fibromatosis: Secondary | ICD-10-CM | POA: Diagnosis not present

## 2019-10-09 DIAGNOSIS — B351 Tinea unguium: Secondary | ICD-10-CM | POA: Diagnosis not present

## 2019-10-09 NOTE — Telephone Encounter (Signed)
Patient needs a return to work date and needs to know what his restrictions are.

## 2019-10-10 NOTE — Progress Notes (Signed)
Subjective:   Patient ID: [name] Foster, male   DOB: 54 y.o.   MRN: [id number]   HPI Patient states he seems to be improving right and states that it is feeling better and he still gets some discomfort but not as much of the back of the heel but the bottom but it appears more numb than it is actual pain currently   ROS      Objective:  Physical Exam  Neurovascular status intact with posterior Achilles tendon healing well incision site healed well with good range of motion and improved dorsiflexion secondary to gastroc with patient having discomfort of a mild nature plantar aspect right heel and also has concerns about nail discoloration bilateral     Assessment:  Mycotic nail condition localized with patient doing very well after having posterior heel surgery approximate 6 months ago with plantar pain that is mild in intensity     Plan:  H NP reviewed all conditions and I am discharging him currently for the posterior heel advised on good support for the plantar heel with stretching exercises and for the nails we will begin topical antifungal therapy that he will start using with instructions given today

## 2019-10-10 NOTE — Telephone Encounter (Signed)
Should be able to return to work within next 2 weeks with no restrictions that I'm aware of

## 2019-10-13 ENCOUNTER — Other Ambulatory Visit: Payer: Self-pay

## 2019-10-13 ENCOUNTER — Encounter: Payer: Self-pay | Admitting: Family Medicine

## 2019-10-13 ENCOUNTER — Ambulatory Visit (INDEPENDENT_AMBULATORY_CARE_PROVIDER_SITE_OTHER): Payer: Commercial Managed Care - PPO | Admitting: Family Medicine

## 2019-10-13 ENCOUNTER — Ambulatory Visit: Payer: Self-pay

## 2019-10-13 ENCOUNTER — Encounter: Payer: Self-pay | Admitting: Podiatry

## 2019-10-13 DIAGNOSIS — M545 Low back pain, unspecified: Secondary | ICD-10-CM

## 2019-10-13 DIAGNOSIS — G8929 Other chronic pain: Secondary | ICD-10-CM

## 2019-10-13 DIAGNOSIS — M25511 Pain in right shoulder: Secondary | ICD-10-CM | POA: Diagnosis not present

## 2019-10-13 DIAGNOSIS — M25512 Pain in left shoulder: Secondary | ICD-10-CM

## 2019-10-13 MED ORDER — BACLOFEN 10 MG PO TABS: 5.0000 mg | ORAL_TABLET | Freq: Every evening | ORAL | 3 refills | Status: DC | PRN

## 2019-10-13 MED ORDER — CELECOXIB 200 MG PO CAPS: 200.0000 mg | ORAL_CAPSULE | Freq: Two times a day (BID) | ORAL | 6 refills | Status: DC | PRN

## 2019-10-13 NOTE — Progress Notes (Signed)
Office Visit Note   Patient: [name] Foster           Date of Birth: 09/08/1966           MRN: [id number] Visit Date: 10/13/2019 Requested by: [name], MD [address] [place],  [place] [address] PCP: [name], MD  Subjective: Chief Complaint  Patient presents with  . Right Shoulder - Pain    Pain in both shoulders, left greater than right. Cortisone injections in the past have helped. However, the last injections only lasted one month (07/14/19). The pain wakes him at night.   . Left Shoulder - Pain    HPI: He is here with persistent bilateral shoulder pain.  Most recent injection in November gave only temporary relief lasting for about a month.  Even the first injection about a year ago, never gave complete relief of his symptoms.  He was much better for a while but the pain came back.  Pain seems to be more in the anterior aspect of his shoulders and hurts when reaching overhead or behind the back.  His low back is still bothering him.  He is now working with chiropractor but has not noticed much difference.  He was released from podiatry care where he was getting his disability paperwork completed.  He asked me to take over that aspect because of his ongoing shoulder and back pain.               ROS:   All other systems were reviewed and are negative.  Objective: Vital Signs: There were no vitals taken for this visit.  Physical Exam:  General:  Alert and oriented, in no acute distress. Pulm:  Breathing unlabored. Psy:  Normal mood, congruent affect.  Shoulders: He is developing adhesive capsulitis bilaterally and symmetrically with decreased overhead reach.  Pain reaching behind the back as well.  Isometric rotator cuff strength is 5/5 throughout but he has quite a bit of pain with empty can test on both sides.  Speeds test is equivocal.  Both shoulders are tender at the [facility] joint and near the long head biceps tendons.  No significant pain in the  subacromial space.   Imaging: X-rays both shoulders: Moderate AC joint arthropathy in both shoulders.  Glenohumeral joint looks normal, no soft tissue calcifications.  Lung fields look clear.  X-rays lumbar spine: Mild degenerative disc disease at multiple levels.  Mild facet arthropathy.  SI joints look good and hip joints are well-preserved.    Assessment & Plan: 1.  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 -We will order bilateral MRI scans to rule out rotator cuff tear that might require surgery.  Surgical consult if indicated. -If MRI scans are negative for surgical pathology, then physical therapy.  2.  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  Could contemplate referral for epidural injections depending on the findings.     Procedures: No procedures performed  No notes on file     PMFS History: Patient Active Problem List   Diagnosis Date Noted  . Acute respiratory failure with hypoxia ([place]) 07/24/2019  . Pneumonia due to COVID-19 virus 07/23/2019  . Bilateral inguinal hernia without obstruction or gangrene 04/11/2017  . Umbilical hernia without obstruction and without gangrene 04/11/2017  . Vitamin D deficiency 01/24/2017  . Type 2 diabetes mellitus without complication, without long-term current use of insulin ([place])  01/24/2017  . Depression 01/24/2017  . Shortness of breath on exertion 11/27/2016  . Internal hemorrhoids   . Atypical chest pain   . Gastroesophageal reflux disease without esophagitis   . Dyspnea 01/18/2016  . Preventative health care 10/10/2015  . Sleep apnea 10/10/2015  . Urinary hesitancy 09/30/2015  . Fatty liver 07/04/2015  . Edema 05/07/2014  . Scleroderma of esophagus ([place]) 03/20/2014  . Hiatal hernia with gastroesophageal reflux 03/20/2014  . Anxiety state, unspecified 07/13/2013  . Erectile dysfunction 01/15/2013  . Back pain 03/09/2012  . Mesenteric  panniculitis ([place]) 02/06/2012  . Heel pain, bilateral 04/25/2010  . Obesity 01/05/2010  . Hyperlipidemia 10/04/2009  . Essential hypertension 10/04/2009   Past Medical History:  Diagnosis Date  . Bone spur    left foot  . Chest pain   . COVID-19   . Diabetes mellitus type 2 in obese ([place]) 07/13/2013  . Diverticulosis   . Erectile dysfunction 01/15/2013  . Esophageal reflux 01/15/2013  . Fatty liver 07/04/2015  . GERD (gastroesophageal reflux disease)   . Hiatal hernia   . Hiatal hernia with gastroesophageal reflux 03/20/2014  . Hyperglycemia 07/13/2013  . Hyperplastic colon polyp   . Hypertension   . Internal hemorrhoids   . Mixed hyperlipidemia   . Murmur   . OSA (obstructive sleep apnea)    s/p UPPP  . Plantar fasciitis   . Pneumonia due to COVID-19 virus   . Prediabetes   . Reflux esophagitis   . Stomach ulcer    from PCP  . Urinary hesitancy 09/30/2015    Family History  Problem Relation Age of Onset  . Dementia Mother   . Diabetes Mother   . Hypertension Mother   . Heart failure Mother   . Stroke Mother   . Obesity Mother   . Hypertension Other        siblings  . Diabetes Sister   . Cancer Brother        lung cancer smoker  . Heart attack Maternal Grandmother   . Diabetes Sister   . Pancreatic disease Sister   . Obesity Father   . Colon cancer Neg Hx     Past Surgical History:  Procedure Laterality Date  . [place] STUDY N/A 02/28/2016   Procedure: [place] STUDY;  Surgeon: Jerene Bears, MD;  [facility];  Service: Gastroenterology;  Laterality: N/A;  . ESOPHAGEAL MANOMETRY N/A 09/01/2013   Procedure: ESOPHAGEAL MANOMETRY (EM);  Surgeon: Sable Feil, MD;  [facility];  Service: Endoscopy;  Laterality: N/A;  . ESOPHAGEAL MANOMETRY N/A 02/28/2016   Procedure: ESOPHAGEAL MANOMETRY (EM);  Surgeon: Jerene Bears, MD;  [facility];  Service: Gastroenterology;  Laterality: N/A;  . INGUINAL HERNIA REPAIR    . TONSILLECTOMY     . UMBILICAL HERNIA REPAIR    . UVULECTOMY     Social History   Occupational History  . Occupation: Nature conservation officer: [employer]  Tobacco Use  . Smoking status: Never Smoker  . Smokeless tobacco: Never Used  Substance and Sexual Activity  . Alcohol use: No  . Drug use: No  . Sexual activity: Yes    Partners: Female

## 2019-10-22 ENCOUNTER — Ambulatory Visit (INDEPENDENT_AMBULATORY_CARE_PROVIDER_SITE_OTHER): Payer: Commercial Managed Care - PPO | Admitting: Family Medicine

## 2019-10-22 ENCOUNTER — Encounter: Payer: Self-pay | Admitting: Family Medicine

## 2019-10-22 ENCOUNTER — Other Ambulatory Visit: Payer: Self-pay | Admitting: Family Medicine

## 2019-10-22 ENCOUNTER — Ambulatory Visit (HOSPITAL_BASED_OUTPATIENT_CLINIC_OR_DEPARTMENT_OTHER)
Admission: RE | Admit: 2019-10-22 | Discharge: 2019-10-22 | Disposition: A | Discharge status: Home / Self Care | Payer: Commercial Managed Care - PPO | Source: Ambulatory Visit | Attending: Family Medicine | Admitting: Family Medicine

## 2019-10-22 ENCOUNTER — Other Ambulatory Visit: Payer: Self-pay

## 2019-10-22 DIAGNOSIS — R079 Chest pain, unspecified: Secondary | ICD-10-CM

## 2019-10-22 DIAGNOSIS — U071 COVID-19: Secondary | ICD-10-CM | POA: Diagnosis not present

## 2019-10-22 DIAGNOSIS — G8929 Other chronic pain: Secondary | ICD-10-CM

## 2019-10-22 DIAGNOSIS — R059 Cough, unspecified: Secondary | ICD-10-CM

## 2019-10-22 DIAGNOSIS — M25512 Pain in left shoulder: Secondary | ICD-10-CM | POA: Diagnosis not present

## 2019-10-22 DIAGNOSIS — M545 Low back pain: Secondary | ICD-10-CM | POA: Diagnosis not present

## 2019-10-22 DIAGNOSIS — J1282 Pneumonia due to coronavirus disease 2019: Secondary | ICD-10-CM

## 2019-10-22 DIAGNOSIS — M25511 Pain in right shoulder: Secondary | ICD-10-CM

## 2019-10-22 DIAGNOSIS — R05 Cough: Secondary | ICD-10-CM

## 2019-10-22 NOTE — Progress Notes (Signed)
Virtual Visit via Video Note  I connected with [name] Foster on 10/22/19 at 11:00 AM EST by a video enabled telemedicine application and verified that I am speaking with the correct person using two identifiers.  Location: Patient: in car alone Provider: home    I discussed the limitations of evaluation and management by telemedicine and the availability of in person appointments. The patient expressed understanding and agreed to proceed.  History of Present Illness: Pt is in his car c/o chest pain x 1 month    He was in the hosp with covid  And was in the hosp for 5 days ----he had pneumonia Pain never completely went away and he still has dry cough No fevers, no congestion  Past Medical History:  Diagnosis Date  . Bone spur    left foot  . Chest pain   . COVID-19   . Diabetes mellitus type 2 in obese ([place]) 07/13/2013  . Diverticulosis   . Erectile dysfunction 01/15/2013  . Esophageal reflux 01/15/2013  . Fatty liver 07/04/2015  . GERD (gastroesophageal reflux disease)   . Hiatal hernia   . Hiatal hernia with gastroesophageal reflux 03/20/2014  . Hyperglycemia 07/13/2013  . Hyperplastic colon polyp   . Hypertension   . Internal hemorrhoids   . Mixed hyperlipidemia   . Murmur   . OSA (obstructive sleep apnea)    s/p UPPP  . Plantar fasciitis   . Pneumonia due to COVID-19 virus   . Prediabetes   . Reflux esophagitis   . Stomach ulcer    from PCP  . Urinary hesitancy 09/30/2015   Current Outpatient Medications on File Prior to Visit  Medication Sig Dispense Refill  . acetaminophen (TYLENOL) 500 MG tablet Take 1 tablet (500 mg total) by mouth every 6 (six) hours as needed. 60 tablet 0  . amLODipine (NORVASC) 10 MG tablet Take 1 tablet (10 mg total) by mouth daily. 30 tablet 0  . aspirin EC 81 MG tablet Take 1 tablet (81 mg total) by mouth 2 (two) times daily. 60 tablet 3  . baclofen (LIORESAL) 10 MG tablet Take 0.5-1 tablets (5-10 mg total) by mouth at bedtime as needed for  muscle spasms. 30 each 3  . Bayer Microlet Lancets lancets Test blood sugars twice daily 100 each 0  . Blood Glucose Monitoring Suppl (CONTOUR NEXT MONITOR) w/Device KIT 1 kit by Does not apply route 2 (two) times daily. 1 kit 0  . calcium carbonate (TUMS EX) 750 MG chewable tablet Chew 2 tablets by mouth daily as needed for heartburn.    . carvedilol (COREG) 6.25 MG tablet Take 1 tablet (6.25 mg total) by mouth 2 (two) times daily. 60 tablet 0  . celecoxib (CELEBREX) 200 MG capsule Take 1 capsule (200 mg total) by mouth 2 (two) times daily as needed. 60 capsule 6  . famotidine (PEPCID) 20 MG tablet Take 1 tablet (20 mg total) by mouth 2 (two) times daily. 60 tablet 3  . fluticasone (FLONASE) 50 MCG/ACT nasal spray Place 2 sprays into both nostrils daily. 16 g 6  . glucose blood (CONTOUR NEXT TEST) test strip Test blood sugars twice daily 100 each 0  . ibuprofen (ADVIL) 600 MG tablet Take 600 mg by mouth 3 (three) times daily.    . metFORMIN (GLUCOPHAGE) 500 MG tablet Take 1 tablet (500 mg total) by mouth daily with breakfast. 90 tablet 1  . Misc. Devices (PULSE OXIMETER FOR FINGER) MISC 1 Device by Does not apply route  as needed (SOB, fatigue, headache patient with COVID). 1 each 0  . Multiple Vitamin (MULTIVITAMIN) tablet Take 1 tablet by mouth daily.    . Multiple Vitamins-Minerals (MULTIVITAMIN WITH MINERALS) tablet Take 1 tablet by mouth daily. 100 tablet 5  . omeprazole (PRILOSEC) 40 MG capsule Take 1 capsule by mouth once daily in the morning 30 capsule 0  . ondansetron (ZOFRAN) 4 MG tablet Take 4 mg by mouth every 8 (eight) hours as needed for nausea or vomiting.    . promethazine (PHENERGAN) 25 MG suppository Place 1 suppository (25 mg total) rectally every 6 (six) hours as needed for nausea or vomiting. 12 each 0  . rosuvastatin (CRESTOR) 10 MG tablet Take 1 tablet by mouth once daily 30 tablet 6  . Spacer/Aero-Hold Chamber [employer] Use as directed with inhaler 1 each 1  . tadalafil  (CIALIS) 5 MG tablet Take 1 tablet (5 mg total) by mouth daily as needed for erectile dysfunction. 30 tablet 2  . Thermometer MISC 1 Device by Does not apply route as needed. Patient with covid 1 Device 0  . triamterene-hydrochlorothiazide (MAXZIDE-25) 37.5-25 MG tablet Take 1 tablet by mouth once daily 90 tablet 0  . Vitamin D, Ergocalciferol, (DRISDOL) 1.25 MG (50000 UT) CAPS capsule TAKE 1 CAPSULE BY MOUTH ONCE A WEEK FOR  12  WEEKS 4 capsule 0  . [DISCONTINUED] amitriptyline (ELAVIL) 25 MG tablet Take 1 tablet (25 mg total) by mouth at bedtime. (Patient not taking: Reported on 07/14/2019) 30 tablet 2  . [DISCONTINUED] hyoscyamine (LEVSIN SL) 0.125 MG SL tablet Place 1 tablet (0.125 mg total) under the tongue every 4 (four) hours as needed. (Patient not taking: Reported on 07/14/2019) 30 tablet 5   No current facility-administered medications on file prior to visit.   Allergies  Allergen Reactions  . Hydrocodone Itching  . Tramadol Itching    Observations/Objective: There were no vitals filed for this visit. Pt is in NAD No sob  Assessment and Plan: 1. Chest pain, unspecified type Med record reviewed from [facility] and f/u pcp  - DG Chest 2 View; Future  Follow Up Instructions:    I discussed the assessment and treatment plan with the patient. The patient was provided an opportunity to ask questions and all were answered. The patient agreed with the plan and demonstrated an understanding of the instructions.   The patient was advised to call back or seek an in-person evaluation if the symptoms worsen or if the condition fails to improve as anticipated.  I provided 30 minutes of non-face-to-face time during this encounter.   Ann Held, DO

## 2019-10-22 NOTE — Progress Notes (Signed)
Office Visit Note   Patient: [name] Foster           Date of Birth: 09-05-1966           MRN: [id number] Visit Date: 10/22/2019 Requested by: [name], MD [address] [place],  [place] [address] PCP: [name], MD  Subjective: Chief Complaint  Patient presents with  . Right Shoulder - Follow-up    Has not had MRIs of shoulders/back yet - authorization still pending. No change in symptoms from last visit.  . Left Shoulder - Follow-up    HPI: He is here with worsening bilateral shoulder pain.  We still have not gotten approval for MRI scans of the shoulders and low back.  Ibuprofen and Tylenol give some relief, diclofenac gives some relief, he has not yet tried Celebrex.  His shoulder are keeping him awake at night.  Even though previous injections did not give complete relief, he would like to have injections again if possible.              ROS: No fevers or chills.  All other systems were reviewed and are negative.  Objective: Vital Signs: There were no vitals taken for this visit.  Physical Exam:  General:  Alert and oriented, in no acute distress. Pulm:  Breathing unlabored. Psy:  Normal mood, congruent affect. Skin: No rash. Shoulders: Full active range of motion with pain reaching overhead.  He is tender to palpation in the anterior shoulders near the glenohumeral joint.  No significant tenderness at the [facility] joint, slight tenderness at the long head biceps tendon of both shoulders.  Rotator cuff strength is still 5/5 throughout.  Imaging: None today  Assessment & Plan: 1.  Chronic bilateral shoulder pain, cannot rule out rotator cuff tear. -Subacromial injections today.  Proceed with MRI scan.  2.  Chronic low back pain -Awaiting MRI results.     Procedures: Bilateral shoulder injections: After sterile prep with Betadine, injected 5 cc 1% lidocaine without epinephrine and 40 mg methylprednisolone from posterior approach into subacromial  space of both shoulders.    PMFS History: Patient Active Problem List   Diagnosis Date Noted  . Acute respiratory failure with hypoxia ([place]) 07/24/2019  . Pneumonia due to COVID-19 virus 07/23/2019  . Bilateral inguinal hernia without obstruction or gangrene 04/11/2017  . Umbilical hernia without obstruction and without gangrene 04/11/2017  . Vitamin D deficiency 01/24/2017  . Type 2 diabetes mellitus without complication, without long-term current use of insulin ([place]) 01/24/2017  . Depression 01/24/2017  . Shortness of breath on exertion 11/27/2016  . Internal hemorrhoids   . Atypical chest pain   . Gastroesophageal reflux disease without esophagitis   . Dyspnea 01/18/2016  . Preventative health care 10/10/2015  . Sleep apnea 10/10/2015  . Urinary hesitancy 09/30/2015  . Fatty liver 07/04/2015  . Edema 05/07/2014  . Scleroderma of esophagus ([place]) 03/20/2014  . Hiatal hernia with gastroesophageal reflux 03/20/2014  . Anxiety state, unspecified 07/13/2013  . Erectile dysfunction 01/15/2013  . Back pain 03/09/2012  . Mesenteric panniculitis ([place]) 02/06/2012  . Heel pain, bilateral 04/25/2010  . Obesity 01/05/2010  . Hyperlipidemia 10/04/2009  . Essential hypertension 10/04/2009   Past Medical History:  Diagnosis Date  . Bone spur    left foot  . Chest pain   . COVID-19   . Diabetes mellitus type 2 in obese ([place]) 07/13/2013  . Diverticulosis   . Erectile dysfunction 01/15/2013  . Esophageal  reflux 01/15/2013  . Fatty liver 07/04/2015  . GERD (gastroesophageal reflux disease)   . Hiatal hernia   . Hiatal hernia with gastroesophageal reflux 03/20/2014  . Hyperglycemia 07/13/2013  . Hyperplastic colon polyp   . Hypertension   . Internal hemorrhoids   . Mixed hyperlipidemia   . Murmur   . OSA (obstructive sleep apnea)    s/p UPPP  . Plantar fasciitis   . Pneumonia due to COVID-19 virus   . Prediabetes   . Reflux esophagitis   . Stomach ulcer    from PCP  . Urinary  hesitancy 09/30/2015    Family History  Problem Relation Age of Onset  . Dementia Mother   . Diabetes Mother   . Hypertension Mother   . Heart failure Mother   . Stroke Mother   . Obesity Mother   . Hypertension Other        siblings  . Diabetes Sister   . Cancer Brother        lung cancer smoker  . Heart attack Maternal Grandmother   . Diabetes Sister   . Pancreatic disease Sister   . Obesity Father   . Colon cancer Neg Hx     Past Surgical History:  Procedure Laterality Date  . [place] STUDY N/A 02/28/2016   Procedure: [place] STUDY;  Surgeon: Jerene Bears, MD;  [facility];  Service: Gastroenterology;  Laterality: N/A;  . ESOPHAGEAL MANOMETRY N/A 09/01/2013   Procedure: ESOPHAGEAL MANOMETRY (EM);  Surgeon: Sable Feil, MD;  [facility];  Service: Endoscopy;  Laterality: N/A;  . ESOPHAGEAL MANOMETRY N/A 02/28/2016   Procedure: ESOPHAGEAL MANOMETRY (EM);  Surgeon: Jerene Bears, MD;  [facility];  Service: Gastroenterology;  Laterality: N/A;  . INGUINAL HERNIA REPAIR    . TONSILLECTOMY    . UMBILICAL HERNIA REPAIR    . UVULECTOMY     Social History   Occupational History  . Occupation: Nature conservation officer: [employer]  Tobacco Use  . Smoking status: Never Smoker  . Smokeless tobacco: Never Used  Substance and Sexual Activity  . Alcohol use: No  . Drug use: No  . Sexual activity: Yes    Partners: Female

## 2019-10-24 ENCOUNTER — Other Ambulatory Visit: Payer: Self-pay | Admitting: Internal Medicine

## 2019-10-29 ENCOUNTER — Other Ambulatory Visit: Payer: Self-pay

## 2019-10-29 ENCOUNTER — Telehealth: Payer: Self-pay | Admitting: Family Medicine

## 2019-10-29 MED ORDER — PROAIR HFA 108 (90 BASE) MCG/ACT IN AERS: 1.0000 | INHALATION_SPRAY | Freq: Four times a day (QID) | RESPIRATORY_TRACT | 5 refills | Status: DC | PRN

## 2019-10-29 MED ORDER — DIAZEPAM 5 MG PO TABS: ORAL_TABLET | ORAL | 0 refills | Status: DC

## 2019-10-29 NOTE — Telephone Encounter (Signed)
Please advise 

## 2019-10-29 NOTE — Telephone Encounter (Signed)
Patient called stated his MRI is 11/01/19 and requesting a RX of something to keep him calm during the MRI.  Please call patient [phone]  [facility] on [address] in [facility]

## 2019-10-29 NOTE — Telephone Encounter (Signed)
Valium Rx sent

## 2019-10-29 NOTE — Telephone Encounter (Signed)
I called and advised the patient. 

## 2019-11-01 ENCOUNTER — Ambulatory Visit (INDEPENDENT_AMBULATORY_CARE_PROVIDER_SITE_OTHER): Payer: Commercial Managed Care - PPO

## 2019-11-01 ENCOUNTER — Other Ambulatory Visit: Payer: Self-pay | Admitting: Orthopedic Surgery

## 2019-11-01 ENCOUNTER — Other Ambulatory Visit: Payer: Self-pay

## 2019-11-01 DIAGNOSIS — M545 Low back pain, unspecified: Secondary | ICD-10-CM

## 2019-11-01 DIAGNOSIS — M19012 Primary osteoarthritis, left shoulder: Secondary | ICD-10-CM

## 2019-11-01 DIAGNOSIS — M25512 Pain in left shoulder: Secondary | ICD-10-CM

## 2019-11-01 DIAGNOSIS — G8929 Other chronic pain: Secondary | ICD-10-CM | POA: Diagnosis not present

## 2019-11-01 DIAGNOSIS — M25511 Pain in right shoulder: Secondary | ICD-10-CM | POA: Diagnosis not present

## 2019-11-01 MED ORDER — DIAZEPAM 5 MG PO TABS: ORAL_TABLET | ORAL | 0 refills | Status: DC

## 2019-11-03 ENCOUNTER — Telehealth: Payer: Self-pay | Admitting: Family Medicine

## 2019-11-03 NOTE — Telephone Encounter (Signed)
Right shoulder MRI scan shows a tear of the labrum cartilage.  If shoulder pain does not improve with recent injection, could try a different injection down into the glenohumeral joint, or else consult with one of our surgeons for possible arthroscopic surgery.  Left shoulder MRI scan shows tendinopathy changes but no tendon or cartilage tears.  There is some arthritis at the end of the collarbone.  No definite indication for surgery based on these results.  Lumbar spine MRI scan looks normal.  Back pain is probably muscular.

## 2019-11-04 ENCOUNTER — Ambulatory Visit: Payer: Commercial Managed Care - PPO | Admitting: Internal Medicine

## 2019-11-05 ENCOUNTER — Encounter: Payer: Self-pay | Admitting: Family Medicine

## 2019-11-05 ENCOUNTER — Ambulatory Visit: Payer: Commercial Managed Care - PPO | Admitting: Family Medicine

## 2019-11-05 ENCOUNTER — Other Ambulatory Visit: Payer: Self-pay

## 2019-11-05 ENCOUNTER — Ambulatory Visit (INDEPENDENT_AMBULATORY_CARE_PROVIDER_SITE_OTHER): Payer: Commercial Managed Care - PPO | Admitting: Family Medicine

## 2019-11-05 DIAGNOSIS — G8929 Other chronic pain: Secondary | ICD-10-CM

## 2019-11-05 DIAGNOSIS — M25512 Pain in left shoulder: Secondary | ICD-10-CM | POA: Diagnosis not present

## 2019-11-05 DIAGNOSIS — M545 Low back pain: Secondary | ICD-10-CM

## 2019-11-05 DIAGNOSIS — M25511 Pain in right shoulder: Secondary | ICD-10-CM

## 2019-11-05 NOTE — Progress Notes (Signed)
Office Visit Note   Patient: [name] Foster           Date of Birth: 26-Apr-1966           MRN: [id number] Visit Date: 11/05/2019 Requested by: [name], MD [address] [place],  [place] [address] PCP: [name], MD  Subjective: Chief Complaint  Patient presents with  . Right Shoulder - Pain, Follow-up    Post MRIs both shoulders. Would like to go over the results in person. The cortisone injections on 10/22/19 did help.  . Left Shoulder - Pain, Follow-up    HPI: He is here to discuss MRI results.  Shoulders both feel better after recent injection.  Right shoulder MRI showed a labrum tear.  Left shoulder showed tendinopathy but no definite tears.  Lumbar spine MRI scan looked unremarkable.  He continues to have low back pain.  He remains out of work.              ROS:   All other systems were reviewed and are negative.  Objective: Vital Signs: There were no vitals taken for this visit.  Physical Exam:  General:  Alert and oriented, in no acute distress. Pulm:  Breathing unlabored. Psy:  Normal mood, congruent affect.  No exam done today.  Imaging: MRI images were reviewed on computer.  Assessment & Plan: 1.  Improved bilateral shoulder pain with right shoulder labrum tear and left shoulder rotator cuff tendinopathy. -Home exercises given for maintenance.  Surgical consult if pain worsens.  2.  Mechanical low back pain -Discussed with patient and he wants to be referred to Dr. Ernestina Patches for possible injection. -Out of work for 1 more months while undergoing treatment.     Procedures: No procedures performed  No notes on file     PMFS History: Patient Active Problem List   Diagnosis Date Noted  . Acute respiratory failure with hypoxia ([place]) 07/24/2019  . Pneumonia due to COVID-19 virus 07/23/2019  . Bilateral inguinal hernia without obstruction or gangrene 04/11/2017  . Umbilical hernia without obstruction and without gangrene 04/11/2017   . Vitamin D deficiency 01/24/2017  . Type 2 diabetes mellitus without complication, without long-term current use of insulin ([place]) 01/24/2017  . Depression 01/24/2017  . Shortness of breath on exertion 11/27/2016  . Internal hemorrhoids   . Atypical chest pain   . Gastroesophageal reflux disease without esophagitis   . Dyspnea 01/18/2016  . Preventative health care 10/10/2015  . Sleep apnea 10/10/2015  . Urinary hesitancy 09/30/2015  . Fatty liver 07/04/2015  . Edema 05/07/2014  . Scleroderma of esophagus ([place]) 03/20/2014  . Hiatal hernia with gastroesophageal reflux 03/20/2014  . Anxiety state, unspecified 07/13/2013  . Erectile dysfunction 01/15/2013  . Back pain 03/09/2012  . Mesenteric panniculitis ([place]) 02/06/2012  . Heel pain, bilateral 04/25/2010  . Obesity 01/05/2010  . Hyperlipidemia 10/04/2009  . Essential hypertension 10/04/2009   Past Medical History:  Diagnosis Date  . Bone spur    left foot  . Chest pain   . COVID-19   . Diabetes mellitus type 2 in obese ([place]) 07/13/2013  . Diverticulosis   . Erectile dysfunction 01/15/2013  . Esophageal reflux 01/15/2013  . Fatty liver 07/04/2015  . GERD (gastroesophageal reflux disease)   . Hiatal hernia   . Hiatal hernia with gastroesophageal reflux 03/20/2014  . Hyperglycemia 07/13/2013  . Hyperplastic colon polyp   . Hypertension   . Internal hemorrhoids   . Mixed  hyperlipidemia   . Murmur   . OSA (obstructive sleep apnea)    s/p UPPP  . Plantar fasciitis   . Pneumonia due to COVID-19 virus   . Prediabetes   . Reflux esophagitis   . Stomach ulcer    from PCP  . Urinary hesitancy 09/30/2015    Family History  Problem Relation Age of Onset  . Dementia Mother   . Diabetes Mother   . Hypertension Mother   . Heart failure Mother   . Stroke Mother   . Obesity Mother   . Hypertension Other        siblings  . Diabetes Sister   . Cancer Brother        lung cancer smoker  . Heart attack Maternal Grandmother   .  Diabetes Sister   . Pancreatic disease Sister   . Obesity Father   . Colon cancer Neg Hx     Past Surgical History:  Procedure Laterality Date  . [place] STUDY N/A 02/28/2016   Procedure: [place] STUDY;  Surgeon: Jerene Bears, MD;  [facility];  Service: Gastroenterology;  Laterality: N/A;  . ESOPHAGEAL MANOMETRY N/A 09/01/2013   Procedure: ESOPHAGEAL MANOMETRY (EM);  Surgeon: Sable Feil, MD;  [facility];  Service: Endoscopy;  Laterality: N/A;  . ESOPHAGEAL MANOMETRY N/A 02/28/2016   Procedure: ESOPHAGEAL MANOMETRY (EM);  Surgeon: Jerene Bears, MD;  [facility];  Service: Gastroenterology;  Laterality: N/A;  . INGUINAL HERNIA REPAIR    . TONSILLECTOMY    . UMBILICAL HERNIA REPAIR    . UVULECTOMY     Social History   Occupational History  . Occupation: Nature conservation officer: [employer]  Tobacco Use  . Smoking status: Never Smoker  . Smokeless tobacco: Never Used  Substance and Sexual Activity  . Alcohol use: No  . Drug use: No  . Sexual activity: Yes    Partners: Female

## 2019-11-07 ENCOUNTER — Other Ambulatory Visit: Payer: Self-pay

## 2019-11-07 ENCOUNTER — Encounter: Payer: Self-pay | Admitting: Internal Medicine

## 2019-11-07 ENCOUNTER — Ambulatory Visit (INDEPENDENT_AMBULATORY_CARE_PROVIDER_SITE_OTHER): Payer: Commercial Managed Care - PPO | Admitting: Internal Medicine

## 2019-11-07 ENCOUNTER — Ambulatory Visit: Payer: Commercial Managed Care - PPO | Admitting: Family Medicine

## 2019-11-07 VITALS — BP 150/92 | HR 91 | Temp 98.7°F | Ht 72.0 in | Wt 268.0 lb

## 2019-11-07 DIAGNOSIS — K5900 Constipation, unspecified: Secondary | ICD-10-CM | POA: Diagnosis not present

## 2019-11-07 DIAGNOSIS — R0789 Other chest pain: Secondary | ICD-10-CM

## 2019-11-07 DIAGNOSIS — R140 Abdominal distension (gaseous): Secondary | ICD-10-CM | POA: Diagnosis not present

## 2019-11-07 DIAGNOSIS — K219 Gastro-esophageal reflux disease without esophagitis: Secondary | ICD-10-CM | POA: Diagnosis not present

## 2019-11-07 MED ORDER — HYOSCYAMINE SULFATE 0.125 MG SL SUBL: 0.1250 mg | SUBLINGUAL_TABLET | Freq: Three times a day (TID) | SUBLINGUAL | 7 refills | Status: DC | PRN

## 2019-11-07 NOTE — Progress Notes (Signed)
Subjective:    Patient ID: [name] Foster, male    DOB: 11-22-1965, 54 y.o.   MRN: [id number]  HPI [name] Foster is a 54 year old male with a history of GERD with functional heartburn and esophageal hypersensitivity, atypical chest pain, hypertension, hyperlipidemia, sleep apnea who is here for follow-up.  He is here alone today and was last seen in June 2020.  Unfortunately he developed COVID-19 in early November and required hospitalization for 5 days.  He received IV remdesivir and steroid therapy.  He has recovered fully.  His wife and daughter were also sick but did not need hospitalization.  His breathing has improved and returned to baseline.  He continues to have issues with atypical chest pain which she has had for a number of years.  It seems to have migrated under his right breast at times.  Though at other times it remains in the center of his chest and can radiate to the back.  Comes and goes.  Some days can last most of the day.  Occasionally is worse after eating.  Does not feel like he has traditional heartburn.  He is using his omeprazole 40 mg in the morning and famotidine 20 mg in the evening.  Levsin sublingual seems to help the most but he was unsure if he could use this on a regular basis.  We tried amitriptyline last year but it made him very sleepy throughout the day so we stopped it.  We did a Medrol Dosepak which he thinks may have helped briefly but with return of symptoms.  He does have some mild constipation but he has been off of his Benefiber.  Bowel movements have been smaller and somewhat incomplete of late.  No rectal bleeding or melena.  He has had prior upper endoscopy, esophageal manometry, and 24-hour pH with impedance testing.  All of these were rather unremarkable.  He had a normal colonoscopy with the exception of a very distal hyperplastic polyp in 2017.   Review of Systems  As per HPI, otherwise negative  Current Medications, Allergies, Past Medical History,  Past Surgical History, Family History and Social History were reviewed in [employer] record.     Objective:   Physical Exam BP (!) 150/92   Pulse 91   Temp 98.7 F (37.1 C)   Ht 6' (1.829 m)   Wt 268 lb (121.6 kg)   BMI 36.35 kg/m  Gen: awake, alert, NAD HEENT: anicteric CV: RRR, no mrg Pulm: CTA b/l Abd: soft, NT/ND, +BS throughout Ext: no c/c/e Neuro: nonfocal     Assessment & Plan:   54 year old male with a history of GERD with functional heartburn and esophageal hypersensitivity, atypical chest pain, hypertension, hyperlipidemia, sleep apnea who is here for follow-up  1. GERD/atypical chest pain/esophageal hypersensitivity --he has had a thorough GI evaluation as well as cardiac evaluation.  This is a longstanding pain having been present on and off for years.  His GERD seems to be well controlled which was also documented by prior pH testing.  Fortunately antispasmodic, specifically hyoscyamine sublingual helped significantly.  This is a very safe therapy which he can use chronically if needed --Continue omeprazole 40 mg in the morning and Pepcid 20 mg at bedtime --We will use hyoscyamine 0.125 mg sublingual, 1 to 2 tablets 3 times daily as needed  2.  Mild constipation/abdominal bloating --he has been off of Benefiber but it is not uncommon for patients recovering from COVID-19 to have change in bowel  habit.  Fortunately his colonoscopy was normal for screening several years ago. --Resume Benefiber working up to 2 heaping tablespoons daily  3.  CRC screening --repeat screening colonoscopy is recommended in May 2027  30 minutes total spent today including patient facing time, coordination of care, reviewing medical history/procedures/pertinent radiology studies, and documentation of the encounter.

## 2019-11-07 NOTE — Patient Instructions (Signed)
Continue omeprazole.  Continue famotidine.  We have sent the following medications to your pharmacy for you to pick up at your convenience: Hyoscyamine SL 1-2 tablets three times daily as needed (may take up to 6 tablets daily)  Please take Benefiber daily (take this regularly)  If you are age 52 or older, your body mass index should be between 23-30. Your Body mass index is 36.35 kg/m. If this is out of the aforementioned range listed, please consider follow up with your Primary Care Provider.  If you are age 60 or younger, your body mass index should be between 19-25. Your Body mass index is 36.35 kg/m. If this is out of the aformentioned range listed, please consider follow up with your Primary Care Provider.

## 2019-11-21 ENCOUNTER — Other Ambulatory Visit: Payer: Self-pay | Admitting: Family Medicine

## 2019-11-21 ENCOUNTER — Telehealth: Payer: Self-pay | Admitting: Family Medicine

## 2019-11-21 MED ORDER — METHYLPREDNISOLONE 4 MG PO TABS: ORAL_TABLET | ORAL | 0 refills | Status: DC

## 2019-11-21 MED ORDER — TIZANIDINE HCL 4 MG PO TABS: 2.0000 mg | ORAL_TABLET | Freq: Two times a day (BID) | ORAL | 0 refills | Status: DC | PRN

## 2019-11-21 MED FILL — METHYLPREDNISOLONE 4 MG TAB: 4 | 5 days supply | Qty: 15 | Fill #0

## 2019-11-21 MED FILL — tiZANidine HCL 4 MG TABS: 4 | 20 days supply | Qty: 40 | Fill #0

## 2019-11-21 NOTE — Telephone Encounter (Signed)
I could send him in a Medrol dose pak and some Tizanidine to take at bedtime to take with him, see if they want to do that. I can also refer him to [facility] for shots but do not know his timeline

## 2019-11-21 NOTE — Telephone Encounter (Signed)
Spoke with wife.  They are leaving today to drive to [place] for funeral.  She agrees to prescriptions. Please send to [facility].

## 2019-11-21 NOTE — Telephone Encounter (Signed)
I have sent them in.

## 2019-11-21 NOTE — Telephone Encounter (Signed)
Patient's wife states he has a death in family . Patient is experiencing back pain, Patient can not get in with Pain Mgt until March 17,2021. Patient is wondering if we can give shot to him before he leaves to go to [place].    Patient's wife call back # [phone]

## 2019-11-21 NOTE — Telephone Encounter (Signed)
Patient 's wife called back, stating that she needs help with husband and back pain.

## 2019-11-21 NOTE — Telephone Encounter (Signed)
Please advise 

## 2019-11-25 ENCOUNTER — Other Ambulatory Visit: Payer: Self-pay

## 2019-11-25 ENCOUNTER — Ambulatory Visit (HOSPITAL_COMMUNITY)
Admission: EM | Admit: 2019-11-25 | Discharge: 2019-11-25 | Discharge status: Against Medical Advice | Payer: Commercial Managed Care - PPO

## 2019-11-25 ENCOUNTER — Encounter: Payer: Self-pay | Admitting: Physical Medicine and Rehabilitation

## 2019-11-25 ENCOUNTER — Ambulatory Visit (INDEPENDENT_AMBULATORY_CARE_PROVIDER_SITE_OTHER): Payer: Commercial Managed Care - PPO | Admitting: Physical Medicine and Rehabilitation

## 2019-11-25 ENCOUNTER — Ambulatory Visit (HOSPITAL_COMMUNITY)
Admission: EM | Admit: 2019-11-25 | Discharge: 2019-11-25 | Disposition: A | Discharge status: Home / Self Care | Payer: Commercial Managed Care - PPO | Attending: Emergency Medicine | Admitting: Emergency Medicine

## 2019-11-25 ENCOUNTER — Encounter (HOSPITAL_COMMUNITY): Payer: Self-pay

## 2019-11-25 ENCOUNTER — Ambulatory Visit: Payer: Self-pay

## 2019-11-25 VITALS — BP 164/107 | HR 82

## 2019-11-25 DIAGNOSIS — R300 Dysuria: Secondary | ICD-10-CM | POA: Insufficient documentation

## 2019-11-25 DIAGNOSIS — R350 Frequency of micturition: Secondary | ICD-10-CM | POA: Diagnosis not present

## 2019-11-25 DIAGNOSIS — M47816 Spondylosis without myelopathy or radiculopathy, lumbar region: Secondary | ICD-10-CM

## 2019-11-25 MED ORDER — PHENAZOPYRIDINE HCL 200 MG PO TABS: 200.0000 mg | ORAL_TABLET | Freq: Three times a day (TID) | ORAL | 0 refills | Status: DC

## 2019-11-25 MED ORDER — METHYLPREDNISOLONE ACETATE 80 MG/ML IJ SUSP: 40.0000 mg | Freq: Once | INTRAMUSCULAR | Status: DC

## 2019-11-25 NOTE — ED Triage Notes (Addendum)
Pt c/o frequent urination that started last night and dysuria started today. 8/10 sharp, Lower back pain. Pt denies blood in urine. Pt denies abd pain.

## 2019-11-25 NOTE — Progress Notes (Signed)
 .  Numeric Pain Rating Scale and Functional Assessment Average Pain 9   In the last MONTH (on 0-10 scale) has pain interfered with the following?  1. General activity like being  able to carry out your everyday physical activities such as walking, climbing stairs, carrying groceries, or moving a chair?  Rating(9)   +Driver, -BT, -Dye Allergies.  

## 2019-11-25 NOTE — ED Provider Notes (Signed)
[name]    CSN: [id number] Arrival date & time: 11/25/19  1542      History   Chief Complaint Chief Complaint  Patient presents with  . Urinary Tract Infection    HPI [name] Foster is a 54 y.o. male.   Reports urinary frequency, dysuria, low back pain.  Reports that he had epidural for his back pain today from pain management.  Denies hematuria, foul odor, fever, chills, headache, nausea, vomiting, diarrhea, rash, other symptoms.  ROS per HPI  The history is provided by the patient.    Past Medical History:  Diagnosis Date  . Bone spur    left foot  . Chest pain   . COVID-19   . Diabetes mellitus type 2 in obese ([place]) 07/13/2013  . Diverticulosis   . Erectile dysfunction 01/15/2013  . Esophageal reflux 01/15/2013  . Fatty liver 07/04/2015  . GERD (gastroesophageal reflux disease)   . Hiatal hernia   . Hiatal hernia with gastroesophageal reflux 03/20/2014  . Hyperglycemia 07/13/2013  . Hyperplastic colon polyp   . Hypertension   . Internal hemorrhoids   . Mixed hyperlipidemia   . Murmur   . OSA (obstructive sleep apnea)    s/p UPPP  . Plantar fasciitis   . Pneumonia due to COVID-19 virus   . Prediabetes   . Reflux esophagitis   . Stomach ulcer    from PCP  . Urinary hesitancy 09/30/2015    Patient Active Problem List   Diagnosis Date Noted  . Acute respiratory failure with hypoxia ([place]) 07/24/2019  . Pneumonia due to COVID-19 virus 07/23/2019  . Bilateral inguinal hernia without obstruction or gangrene 04/11/2017  . Umbilical hernia without obstruction and without gangrene 04/11/2017  . Vitamin D deficiency 01/24/2017  . Type 2 diabetes mellitus without complication, without long-term current use of insulin ([place]) 01/24/2017  . Depression 01/24/2017  . Shortness of breath on exertion 11/27/2016  . Internal hemorrhoids   . Atypical chest pain   . Gastroesophageal reflux disease without esophagitis   . Dyspnea 01/18/2016  . Preventative health  care 10/10/2015  . Sleep apnea 10/10/2015  . Urinary hesitancy 09/30/2015  . Fatty liver 07/04/2015  . Edema 05/07/2014  . Scleroderma of esophagus ([place]) 03/20/2014  . Hiatal hernia with gastroesophageal reflux 03/20/2014  . Anxiety state, unspecified 07/13/2013  . Erectile dysfunction 01/15/2013  . Back pain 03/09/2012  . Mesenteric panniculitis ([place]) 02/06/2012  . Heel pain, bilateral 04/25/2010  . Obesity 01/05/2010  . Hyperlipidemia 10/04/2009  . Essential hypertension 10/04/2009    Past Surgical History:  Procedure Laterality Date  . [place] STUDY N/A 02/28/2016   Procedure: [place] STUDY;  Surgeon: Jerene Bears, MD;  Location: [facility];  Service: Gastroenterology;  Laterality: N/A;  . ESOPHAGEAL MANOMETRY N/A 09/01/2013   Procedure: ESOPHAGEAL MANOMETRY (EM);  Surgeon: Sable Feil, MD;  Location: [facility];  Service: Endoscopy;  Laterality: N/A;  . ESOPHAGEAL MANOMETRY N/A 02/28/2016   Procedure: ESOPHAGEAL MANOMETRY (EM);  Surgeon: Jerene Bears, MD;  Location: [facility];  Service: Gastroenterology;  Laterality: N/A;  . FOOT TENDON TRANSFER Right   . INGUINAL HERNIA REPAIR    . TONSILLECTOMY    . UMBILICAL HERNIA REPAIR    . UVULECTOMY         Home Medications    Prior to Admission medications   Medication Sig Start Date End Date Taking? Authorizing Provider  acetaminophen (TYLENOL) 500 MG tablet Take 1 tablet (500  mg total) by mouth every 6 (six) hours as needed. 05/24/16   [name], MD  amLODipine (NORVASC) 10 MG tablet Take 1 tablet (10 mg total) by mouth daily. 07/28/19   Thurnell Lose, MD  aspirin EC 81 MG tablet Take 1 tablet (81 mg total) by mouth 2 (two) times daily. 08/01/19   [name], MD  Bayer Microlet Lancets lancets Test blood sugars twice daily 08/01/19   [name], MD  Blood Glucose Monitoring Suppl (CONTOUR NEXT MONITOR) w/Device KIT 1 kit by Does not apply route 2 (two) times daily. 03/15/17   Dennard Nip  D, MD  calcium carbonate (TUMS EX) 750 MG chewable tablet Chew 2 tablets by mouth daily as needed for heartburn.    [provider]  carvedilol (COREG) 6.25 MG tablet Take 1 tablet (6.25 mg total) by mouth 2 (two) times daily. 07/28/19 07/27/20  Thurnell Lose, MD  famotidine (PEPCID) 20 MG tablet Take 1 tablet (20 mg total) by mouth 2 (two) times daily. 08/01/19   [name], MD  fluticasone (FLONASE) 50 MCG/ACT nasal spray Place 2 sprays into both nostrils daily. 06/11/18   [name], MD  glucose blood (CONTOUR NEXT TEST) test strip Test blood sugars twice daily 08/01/19   [name], MD  hyoscyamine (LEVSIN SL) 0.125 MG SL tablet Place 1-2 tablets (0.125-0.25 mg total) under the tongue 3 (three) times daily as needed. 11/07/19   Pyrtle, Lajuan Lines, MD  ibuprofen (ADVIL) 600 MG tablet Take 600 mg by mouth 3 (three) times daily. 08/01/19   [provider]  metFORMIN (GLUCOPHAGE) 500 MG tablet Take 1 tablet (500 mg total) by mouth daily with breakfast. 02/03/19   [name], MD  methylPREDNISolone (MEDROL) 4 MG tablet 5 tab po qd X 1d then 4 tab po qd X 1d then 3 tab po qd X 1d then 2 tab po qd then 1 tab po qd 11/21/19   [name], MD  Misc. Devices (PULSE OXIMETER FOR FINGER) MISC 1 Device by Does not apply route as needed (SOB, fatigue, headache patient with COVID). 08/01/19   [name], MD  Multiple Vitamins-Minerals (MULTIVITAMIN WITH MINERALS) tablet Take 1 tablet by mouth daily. 08/01/19   [name], MD  omeprazole (PRILOSEC) 40 MG capsule Take 1 capsule by mouth once daily in the morning 10/24/19   Pyrtle, Lajuan Lines, MD  phenazopyridine (PYRIDIUM) 200 MG tablet Take 1 tablet (200 mg total) by mouth 3 (three) times daily. 11/25/19   Faustino Congress, NP  PROAIR HFA 108 [phone] Base) MCG/ACT inhaler Inhale 1-2 puffs into the lungs every 6 (six) hours as needed for wheezing or shortness of breath. 10/29/19   [name], MD  rosuvastatin (CRESTOR) 10  MG tablet Take 1 tablet by mouth once daily 02/21/19   Richardo Priest, MD  Spacer/Aero-Hold Chamber Bags MISC Use as directed with inhaler 08/01/19   [name], MD  tadalafil (CIALIS) 5 MG tablet Take 1 tablet (5 mg total) by mouth daily as needed for erectile dysfunction. 09/09/19   [name], MD  Thermometer MISC 1 Device by Does not apply route as needed. Patient with covid 08/01/19   [name], MD  tiZANidine (ZANAFLEX) 4 MG tablet Take 0.5-1 tablets (2-4 mg total) by mouth 2 (two) times daily as needed for muscle spasms. 11/21/19   [name], MD  triamterene-hydrochlorothiazide (MAXZIDE-25) 37.5-25 MG tablet Take 1 tablet  by mouth once daily 05/26/19   [name], MD  Vitamin D, Ergocalciferol, (DRISDOL) 1.25 MG (50000 UT) CAPS capsule TAKE 1 CAPSULE BY MOUTH ONCE A WEEK FOR  12  WEEKS 05/27/19   [name], MD  amitriptyline (ELAVIL) 25 MG tablet Take 1 tablet (25 mg total) by mouth at bedtime. Patient not taking: Reported on 07/14/2019 02/13/19 07/18/19  Pyrtle, Lajuan Lines, MD    Family History Family History  Problem Relation Age of Onset  . Dementia Mother   . Diabetes Mother   . Hypertension Mother   . Heart failure Mother   . Stroke Mother   . Obesity Mother   . Hypertension Other        siblings  . Diabetes Sister   . Cancer Brother        lung cancer smoker  . Heart attack Maternal Grandmother   . Diabetes Sister   . Pancreatic disease Sister   . Obesity Father   . Colon cancer Neg Hx     Social History Social History   Tobacco Use  . Smoking status: Never Smoker  . Smokeless tobacco: Never Used  Substance Use Topics  . Alcohol use: No  . Drug use: No     Allergies   Hydrocodone and Tramadol   Review of Systems Review of Systems   Physical Exam Triage Vital Signs ED Triage Vitals  Enc Vitals Group     BP 11/25/19 1611 (!) 178/104     Pulse Rate 11/25/19 1611 67     Resp 11/25/19 1611 18     Temp 11/25/19 1611 98.4 F (36.9  C)     Temp Source 11/25/19 1611 Oral     SpO2 11/25/19 1611 96 %     Weight 11/25/19 1612 260 lb (117.9 kg)     Height 11/25/19 1612 6' (1.829 m)     Head Circumference --      Peak Flow --      Pain Score 11/25/19 1611 8     Pain Loc --      Pain Edu? --      Excl. in [place]? --    No data found.  Updated Vital Signs BP (!) 178/104 (BP Location: Right Arm)   Pulse 67   Temp 98.4 F (36.9 C) (Oral)   Resp 18   Ht 6' (1.829 m)   Wt 260 lb (117.9 kg)   SpO2 96%   BMI 35.26 kg/m   Visual Acuity Right Eye Distance:   Left Eye Distance:   Bilateral Distance:    Right Eye Near:   Left Eye Near:    Bilateral Near:     Physical Exam Vitals and nursing note reviewed.  Constitutional:      General: He is not in acute distress.    Appearance: Normal appearance. He is well-developed. He is obese.  HENT:     Head: Normocephalic and atraumatic.  Eyes:     Extraocular Movements: Extraocular movements intact.     Conjunctiva/sclera: Conjunctivae normal.  Neck:     Vascular: No carotid bruit.  Cardiovascular:     Rate and Rhythm: Normal rate and regular rhythm.     Heart sounds: Normal heart sounds. No murmur.  Pulmonary:     Effort: Pulmonary effort is normal. No respiratory distress.     Breath sounds: Normal breath sounds.  Abdominal:     General: Bowel sounds are normal. There is no distension.  Palpations: Abdomen is soft. There is no mass.     Tenderness: There is no abdominal tenderness. There is no right CVA tenderness, left CVA tenderness, guarding or rebound.     Hernia: No hernia is present.  Genitourinary:    Penis: Normal.   Musculoskeletal:        General: Tenderness present. No swelling, deformity or signs of injury.     Cervical back: Normal range of motion and neck supple. No rigidity or tenderness.     Comments: Tenderness to left low back  Lymphadenopathy:     Cervical: No cervical adenopathy.  Skin:    General: Skin is warm and dry.      Capillary Refill: Capillary refill takes less than 2 seconds.  Neurological:     General: No focal deficit present.     Mental Status: He is alert and oriented to person, place, and time.  Psychiatric:        Mood and Affect: Mood normal.        Behavior: Behavior normal.      UC Treatments / Results  Labs (all labs ordered are listed, but only abnormal results are displayed) Labs Reviewed  CYTOLOGY, (ORAL, ANAL, URETHRAL) ANCILLARY ONLY    EKG   Radiology XR C-ARM NO REPORT  Result Date: 11/25/2019 Please see Notes tab for imaging impression.   Procedures Procedures (including critical care time)  Medications Ordered in UC Medications - No data to display  Initial Impression / Assessment and Plan / UC Course  I have reviewed the triage vital signs and the nursing notes.  Pertinent labs & imaging results that were available during my care of the patient were reviewed by me and considered in my medical decision making (see chart for details).  Clinical Course as of Nov 24 1733  Tue Nov 25, 2019  1625 POCT Urinalysis, Dipstick [SM]  1631 POCT Urinalysis, Dipstick [SM]  1733 POCT Urinalysis, Dipstick [SM]    Clinical Course User Index [SM] Faustino Congress, NP    Dysuria, frequency, urgency since last night.  UA negative for infection in office today.  STD swab obtained, will inform patient when results are back and will treat as needed.  Discussed with patient other causes of dysuria, frequency, urgency could include prostate issues, testicular issues, if everything is negative patient agrees to follow-up with primary care.  Pyridium prescribed to help with dysuria. Final Clinical Impressions(s) / UC Diagnoses   Final diagnoses:  Dysuria  Urinary frequency     Discharge Instructions     We will call you with test results, and treat as needed. If everything is negative, follow up with primary care for possible prostatitis.     ED Prescriptions     Medication Sig Dispense Auth. Provider   phenazopyridine (PYRIDIUM) 200 MG tablet Take 1 tablet (200 mg total) by mouth 3 (three) times daily. 6 tablet Faustino Congress, NP     I have reviewed the PDMP during this encounter.   Faustino Congress, NP 11/25/19 1735

## 2019-11-25 NOTE — Discharge Instructions (Addendum)
We will call you with test results, and treat as needed. If everything is negative, follow up with primary care for possible prostatitis.

## 2019-11-26 LAB — POCT URINALYSIS DIP (DEVICE)
Bilirubin Urine: NEGATIVE
Glucose, UA: NEGATIVE mg/dL
Hgb urine dipstick: NEGATIVE
Ketones, ur: NEGATIVE mg/dL
Leukocytes,Ua: NEGATIVE
Nitrite: NEGATIVE
Protein, ur: NEGATIVE mg/dL
Specific Gravity, Urine: 1.025 (ref 1.005–1.030)
Urobilinogen, UA: 0.2 mg/dL (ref 0.0–1.0)
pH: 7 (ref 5.0–8.0)

## 2019-11-27 LAB — CYTOLOGY, (ORAL, ANAL, URETHRAL) ANCILLARY ONLY
Chlamydia: NEGATIVE
Neisseria Gonorrhea: NEGATIVE
Trichomonas: NEGATIVE

## 2019-12-02 ENCOUNTER — Other Ambulatory Visit: Payer: Self-pay | Admitting: Family Medicine

## 2019-12-02 ENCOUNTER — Other Ambulatory Visit: Payer: Self-pay | Admitting: Internal Medicine

## 2019-12-02 DIAGNOSIS — I10 Essential (primary) hypertension: Secondary | ICD-10-CM

## 2019-12-03 ENCOUNTER — Ambulatory Visit: Payer: Commercial Managed Care - PPO | Admitting: Podiatry

## 2019-12-03 NOTE — Telephone Encounter (Signed)
Looks like hospital took this off his med list.  Do you want him to continue?

## 2019-12-03 NOTE — Telephone Encounter (Signed)
Please check with him and see if he has restarted it and if he has been watching his BP. If he has not been checking his bp let's bring him in for a nurse visit and check it. If he has and it has been normal get a few numbers and then only refill the Amlodipine if he has been taking it. If he has not discontinue and set him up with an appointment to evaluate

## 2019-12-04 NOTE — Telephone Encounter (Signed)
Left message on machine to call back  

## 2019-12-05 ENCOUNTER — Other Ambulatory Visit: Payer: Self-pay | Admitting: *Deleted

## 2019-12-05 ENCOUNTER — Ambulatory Visit (INDEPENDENT_AMBULATORY_CARE_PROVIDER_SITE_OTHER): Payer: Commercial Managed Care - PPO | Admitting: Family Medicine

## 2019-12-05 ENCOUNTER — Other Ambulatory Visit: Payer: Self-pay

## 2019-12-05 ENCOUNTER — Encounter: Payer: Self-pay | Admitting: Family Medicine

## 2019-12-05 DIAGNOSIS — G8929 Other chronic pain: Secondary | ICD-10-CM | POA: Diagnosis not present

## 2019-12-05 DIAGNOSIS — M545 Low back pain: Secondary | ICD-10-CM | POA: Diagnosis not present

## 2019-12-05 DIAGNOSIS — M25512 Pain in left shoulder: Secondary | ICD-10-CM | POA: Diagnosis not present

## 2019-12-05 MED ORDER — AMLODIPINE BESYLATE 10 MG PO TABS: 10.0000 mg | ORAL_TABLET | Freq: Every day | ORAL | 1 refills | Status: DC

## 2019-12-05 NOTE — Progress Notes (Signed)
Office Visit Note   Patient: [name] Foster           Date of Birth: [birth year]           MRN: [id number] Visit Date: 12/05/2019 Requested by: [name], MD [address] [place],  [place] [address] PCP: [name], MD  Subjective: Chief Complaint  Patient presents with  . Left Shoulder - Follow-up  . Lower Back - Follow-up    HPI: He is here for follow-up.  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  He has been out of work for the past year.  He has been to physical therapy, chiropractor.  He has had several injections in his shoulders and recently had a lumbar injection per Dr. Ernestina Patches.  He states that his shoulders are feeling a little bit better right now but he has good days and bad days.  He has recent lumbar injections helped for a couple days but seems to have worn off.  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               ROS:   All other systems were reviewed and are negative.  Objective: Vital Signs: There were no vitals taken for this visit.  Physical Exam:  General:  Alert and oriented, in no acute distress. Pulm:  Breathing unlabored. Psy:  Normal mood, congruent affect.  No exam done.  Imaging: None today  Assessment & Plan: 1.  Chronic bilateral shoulder pain and low back pain -No surgery indicated based on MRI results. -Patient is significantly overweight, and this is likely affecting his back and possibly his shoulders.  Encouraged him to work hard on weight loss. -Gave him exercises to do at home for his shoulders. -We will keep her out of work for the next 2 months and see him back at that point for recheck.     Procedures: No procedures performed  No notes on file     PMFS History: Patient Active Problem List   Diagnosis Date Noted  . Acute respiratory failure with hypoxia ([place]) 07/24/2019  . Pneumonia  due to COVID-19 virus 07/23/2019  . Bilateral inguinal hernia without obstruction or gangrene 04/11/2017  . Umbilical hernia without obstruction and without gangrene 04/11/2017  . Vitamin D deficiency 01/24/2017  . Type 2 diabetes mellitus without complication, without long-term current use of insulin ([place]) 01/24/2017  . Depression 01/24/2017  . Shortness of breath on exertion 11/27/2016  . Internal hemorrhoids   . Atypical chest pain   . Gastroesophageal reflux disease without esophagitis   . Dyspnea 01/18/2016  . Preventative health care 10/10/2015  . Sleep apnea 10/10/2015  . Urinary hesitancy 09/30/2015  . Fatty liver 07/04/2015  . Edema 05/07/2014  . Scleroderma of esophagus ([place]) 03/20/2014  . Hiatal hernia with gastroesophageal reflux 03/20/2014  . Anxiety state, unspecified 07/13/2013  . Erectile dysfunction 01/15/2013  . Back pain 03/09/2012  . Mesenteric panniculitis ([place]) 02/06/2012  . Heel pain, bilateral 04/25/2010  . Obesity 01/05/2010  . Hyperlipidemia 10/04/2009  . Essential hypertension 10/04/2009   Past Medical History:  Diagnosis Date  . Bone spur    left foot  . Chest pain   . COVID-19   . Diabetes mellitus type 2 in obese ([place]) 07/13/2013  . Diverticulosis   . Erectile dysfunction 01/15/2013  .  Esophageal reflux 01/15/2013  . Fatty liver 07/04/2015  . GERD (gastroesophageal reflux disease)   . Hiatal hernia   . Hiatal hernia with gastroesophageal reflux 03/20/2014  . Hyperglycemia 07/13/2013  . Hyperplastic colon polyp   . Hypertension   . Internal hemorrhoids   . Mixed hyperlipidemia   . Murmur   . OSA (obstructive sleep apnea)    s/p UPPP  . Plantar fasciitis   . Pneumonia due to COVID-19 virus   . Prediabetes   . Reflux esophagitis   . Stomach ulcer    from PCP  . Urinary hesitancy 09/30/2015    Family History  Problem Relation Age of Onset  . Dementia Mother   . Diabetes Mother   . Hypertension Mother   . Heart failure Mother   . Stroke  Mother   . Obesity Mother   . Hypertension Other        siblings  . Diabetes Sister   . Cancer Brother        lung cancer smoker  . Heart attack Maternal Grandmother   . Diabetes Sister   . Pancreatic disease Sister   . Obesity Father   . Colon cancer Neg Hx     Past Surgical History:  Procedure Laterality Date  . [place] STUDY N/A 02/28/2016   Procedure: [place] STUDY;  Surgeon: Jerene Bears, MD;  [facility];  Service: Gastroenterology;  Laterality: N/A;  . ESOPHAGEAL MANOMETRY N/A 09/01/2013   Procedure: ESOPHAGEAL MANOMETRY (EM);  Surgeon: Sable Feil, MD;  [facility];  Service: Endoscopy;  Laterality: N/A;  . ESOPHAGEAL MANOMETRY N/A 02/28/2016   Procedure: ESOPHAGEAL MANOMETRY (EM);  Surgeon: Jerene Bears, MD;  [facility];  Service: Gastroenterology;  Laterality: N/A;  . FOOT TENDON TRANSFER Right   . INGUINAL HERNIA REPAIR    . TONSILLECTOMY    . UMBILICAL HERNIA REPAIR    . UVULECTOMY     Social History   Occupational History  . Occupation: Nature conservation officer: [employer]  Tobacco Use  . Smoking status: Never Smoker  . Smokeless tobacco: Never Used  Substance and Sexual Activity  . Alcohol use: No  . Drug use: No  . Sexual activity: Yes    Partners: Female

## 2019-12-05 NOTE — Telephone Encounter (Signed)
Spoke with patient and the hospital increased in to 10mg , which is in his chart.  I apologized that we overlooked this and will get this sent in.  Pharmacy sent in wrong mg.  10mg  sent to pharmacy.

## 2019-12-08 ENCOUNTER — Other Ambulatory Visit: Payer: Self-pay

## 2019-12-08 ENCOUNTER — Ambulatory Visit (INDEPENDENT_AMBULATORY_CARE_PROVIDER_SITE_OTHER): Payer: Commercial Managed Care - PPO | Admitting: Family Medicine

## 2019-12-08 VITALS — BP 181/90 | HR 70 | Temp 98.8°F | Resp 12 | Ht 72.0 in | Wt 272.2 lb

## 2019-12-08 DIAGNOSIS — I10 Essential (primary) hypertension: Secondary | ICD-10-CM

## 2019-12-08 DIAGNOSIS — E785 Hyperlipidemia, unspecified: Secondary | ICD-10-CM

## 2019-12-08 DIAGNOSIS — F3289 Other specified depressive episodes: Secondary | ICD-10-CM

## 2019-12-08 DIAGNOSIS — R350 Frequency of micturition: Secondary | ICD-10-CM | POA: Diagnosis not present

## 2019-12-08 DIAGNOSIS — E119 Type 2 diabetes mellitus without complications: Secondary | ICD-10-CM | POA: Diagnosis not present

## 2019-12-08 DIAGNOSIS — E559 Vitamin D deficiency, unspecified: Secondary | ICD-10-CM | POA: Diagnosis not present

## 2019-12-08 DIAGNOSIS — R109 Unspecified abdominal pain: Secondary | ICD-10-CM

## 2019-12-08 DIAGNOSIS — R300 Dysuria: Secondary | ICD-10-CM | POA: Diagnosis not present

## 2019-12-08 DIAGNOSIS — K219 Gastro-esophageal reflux disease without esophagitis: Secondary | ICD-10-CM

## 2019-12-08 DIAGNOSIS — R0602 Shortness of breath: Secondary | ICD-10-CM

## 2019-12-08 DIAGNOSIS — K449 Diaphragmatic hernia without obstruction or gangrene: Secondary | ICD-10-CM

## 2019-12-08 DIAGNOSIS — F411 Generalized anxiety disorder: Secondary | ICD-10-CM

## 2019-12-08 LAB — POC URINALSYSI DIPSTICK (AUTOMATED)
Bilirubin, UA: NEGATIVE
Blood, UA: NEGATIVE
Glucose, UA: NEGATIVE
Ketones, UA: NEGATIVE
Leukocytes, UA: NEGATIVE
Nitrite, UA: NEGATIVE
Protein, UA: NEGATIVE
Spec Grav, UA: 1.015 (ref 1.010–1.025)
Urobilinogen, UA: 0.2 E.U./dL
pH, UA: 7 (ref 5.0–8.0)

## 2019-12-08 MED ORDER — METHYLPREDNISOLONE 4 MG PO TABS: ORAL_TABLET | ORAL | 0 refills | Status: DC

## 2019-12-08 MED ORDER — CARVEDILOL 12.5 MG PO TABS: 12.5000 mg | ORAL_TABLET | Freq: Two times a day (BID) | ORAL | 1 refills | Status: DC

## 2019-12-08 MED ORDER — SULFAMETHOXAZOLE-TRIMETHOPRIM 800-160 MG PO TABS: 1.0000 | ORAL_TABLET | Freq: Two times a day (BID) | ORAL | 0 refills | Status: DC

## 2019-12-08 MED ORDER — BUPROPION HCL ER (SR) 150 MG PO TB12: 150.0000 mg | ORAL_TABLET | Freq: Every morning | ORAL | 1 refills | Status: DC

## 2019-12-08 NOTE — Assessment & Plan Note (Signed)
Supplement and monitor 

## 2019-12-08 NOTE — Patient Instructions (Signed)
[facility] Blood Pressure cuff, upper arm, want BP 100-140/60-90 Pulse oximeter, want oxygen in 90s  Weekly vitals  Take Multivitamin with minerals, selenium Vitamin D 1000-2000 IU daily Probiotic with lactobacillus and bifidophilus Asprin EC 81 mg daily Fish or krill oil tabs daily Melatonin 2-5 mg at bedtime  [email or web address]  The mRNA technology has been in development for 20 years and we already had the Coronavirus family of viruses (which usually just cause the common cold) genetically mapped already which is why we were able to come up with viable vaccine candidates so quickly in stage 1, then stage 2 scientifically took the correct amount of time what we did to speed it up was just build the manufacturing platform at the same time we were running the experiments so if it worked we could produce faster. And stage 3 has now had many months and millions of people immunized and we are seeing the immunity hold for over 9 months now with sign of it dissipating and no significant numbers of adverse reactions.  During every flu season we see 2 anaphylactic reactions for every million shots given and we initially thought we would see 11 per million with the COVID vaccine but now we see only 2-3 with Moderna and 5 or so with [place] so compared to someone is dying every 20 minutes from [place] and more deadly and infectious strains are coming it is definitely best when weighing the risks and benefits to take the shots.  Another pooled analysis of the 5 most utilized vaccines in the world shows that after full immunization so far no one has died from [place].

## 2019-12-08 NOTE — Assessment & Plan Note (Signed)
Drinking plenty of water but still noting 2 weeks of frequency, dysuria, urgency, hesitancy

## 2019-12-09 LAB — CBC WITH DIFFERENTIAL/PLATELET
Basophils Absolute: 0 10*3/uL (ref 0.0–0.1)
Basophils Relative: 0.6 % (ref 0.0–3.0)
Eosinophils Absolute: 0.1 10*3/uL (ref 0.0–0.7)
Eosinophils Relative: 2 % (ref 0.0–5.0)
HCT: 47.6 % (ref 39.0–52.0)
Hemoglobin: 16 g/dL (ref 13.0–17.0)
Lymphocytes Relative: 35.8 % (ref 12.0–46.0)
Lymphs Abs: 1.7 10*3/uL (ref 0.7–4.0)
MCHC: 33.5 g/dL (ref 30.0–36.0)
MCV: 88.7 fl (ref 78.0–100.0)
Monocytes Absolute: 0.3 10*3/uL (ref 0.1–1.0)
Monocytes Relative: 7.3 % (ref 3.0–12.0)
Neutro Abs: 2.6 10*3/uL (ref 1.4–7.7)
Neutrophils Relative %: 54.3 % (ref 43.0–77.0)
Platelets: 192 10*3/uL (ref 150.0–400.0)
RBC: 5.37 Mil/uL (ref 4.22–5.81)
RDW: 14.6 % (ref 11.5–15.5)
WBC: 4.8 10*3/uL (ref 4.0–10.5)

## 2019-12-09 LAB — PSA: PSA: 3.64 ng/mL (ref 0.10–4.00)

## 2019-12-09 LAB — COMPREHENSIVE METABOLIC PANEL
ALT: 36 U/L (ref 0–53)
AST: 24 U/L (ref 0–37)
Albumin: 4.5 g/dL (ref 3.5–5.2)
Alkaline Phosphatase: 71 U/L (ref 39–117)
BUN: 10 mg/dL (ref 6–23)
CO2: 26 mEq/L (ref 19–32)
Calcium: 9.7 mg/dL (ref 8.4–10.5)
Chloride: 103 mEq/L (ref 96–112)
Creatinine, Ser: 0.92 mg/dL (ref 0.40–1.50)
GFR: 104.49 mL/min (ref >60.00)
Glucose, Bld: 80 mg/dL (ref 70–99)
Potassium: 3.7 mEq/L (ref 3.5–5.1)
Sodium: 137 mEq/L (ref 135–145)
Total Bilirubin: 0.4 mg/dL (ref 0.2–1.2)
Total Protein: 7.4 g/dL (ref 6.0–8.3)

## 2019-12-09 LAB — LIPID PANEL
Cholesterol: 242 mg/dL — ABNORMAL HIGH (ref 0–200)
HDL: 40 mg/dL (ref >39.00)
LDL Cholesterol: 166 mg/dL — ABNORMAL HIGH (ref 0–99)
NonHDL: 202.08
Total CHOL/HDL Ratio: 6
Triglycerides: 181 mg/dL — ABNORMAL HIGH (ref 0.0–149.0)
VLDL: 36.2 mg/dL (ref 0.0–40.0)

## 2019-12-09 LAB — TSH: TSH: 2.43 u[IU]/mL (ref 0.35–4.50)

## 2019-12-09 LAB — HEMOGLOBIN A1C: Hgb A1c MFr Bld: 6.4 % (ref 4.6–6.5)

## 2019-12-09 LAB — VITAMIN D 25 HYDROXY (VIT D DEFICIENCY, FRACTURES): VITD: 19.67 ng/mL — ABNORMAL LOW (ref 30.00–100.00)

## 2019-12-10 ENCOUNTER — Other Ambulatory Visit: Payer: Self-pay | Admitting: *Deleted

## 2019-12-10 DIAGNOSIS — E559 Vitamin D deficiency, unspecified: Secondary | ICD-10-CM

## 2019-12-10 DIAGNOSIS — R350 Frequency of micturition: Secondary | ICD-10-CM | POA: Insufficient documentation

## 2019-12-10 LAB — URINE CULTURE
MICRO NUMBER:: 10302711
Result:: NO GROWTH
SPECIMEN QUALITY:: ADEQUATE

## 2019-12-10 MED ORDER — ROSUVASTATIN CALCIUM 20 MG PO TABS: 20.0000 mg | ORAL_TABLET | Freq: Every day | ORAL | 3 refills | Status: DC

## 2019-12-10 MED ORDER — VITAMIN D (ERGOCALCIFEROL) 1.25 MG (50000 UNIT) PO CAPS: ORAL_CAPSULE | ORAL | 0 refills | Status: DC

## 2019-12-10 NOTE — Assessment & Plan Note (Signed)
Consider prostatitis. Started on Bactrim DS bid, probiotics and referred to urology for further consideration.

## 2019-12-10 NOTE — Assessment & Plan Note (Signed)
hgba1c acceptable, minimize simple carbs. Increase exercise as tolerated. Continue current meds 

## 2019-12-10 NOTE — Assessment & Plan Note (Signed)
Given a refill on Wellbutrin

## 2019-12-10 NOTE — Assessment & Plan Note (Signed)
Avoid offending foods, start probiotics. Do not eat large meals in late evening and consider raising head of bed.  

## 2019-12-10 NOTE — Assessment & Plan Note (Addendum)
His SOB has worsened since his COVID infection. Referred to pulmonology for further consideration. Given a medrol dosepak to use prn if symptoms worsen

## 2019-12-10 NOTE — Assessment & Plan Note (Addendum)
Not well controlled, increase Carvedilol to 12.5 mg po bid. Encouraged heart healthy diet such as the DASH diet and exercise as tolerated.

## 2019-12-10 NOTE — Progress Notes (Signed)
Subjective:    Patient ID: [name] Foster, male    DOB: July 31, 1966, 54 y.o.   MRN: [id number]  Chief Complaint  Patient presents with  . Cough  . Flank Pain  . Urinary Frequency    HPI Patient is in today for follow up on chronic medical concerns. He has been struggling with worsening SOB since he had COVID. No fevers or chills. His cough is improved. He is struggling with urinary frequency, urgency and dysuria. He also notes some left flank pain. His brother has died from Cancer. He has been very sad and anxious and he has lost some friends as well. Denies CP/palp/HA/congestion/fevers/GI c/o. Taking meds as prescribed  Past Medical History:  Diagnosis Date  . Bone spur    left foot  . Chest pain   . COVID-19   . Diabetes mellitus type 2 in obese ([place]) 07/13/2013  . Diverticulosis   . Erectile dysfunction 01/15/2013  . Esophageal reflux 01/15/2013  . Fatty liver 07/04/2015  . GERD (gastroesophageal reflux disease)   . Hiatal hernia   . Hiatal hernia with gastroesophageal reflux 03/20/2014  . Hyperglycemia 07/13/2013  . Hyperplastic colon polyp   . Hypertension   . Internal hemorrhoids   . Mixed hyperlipidemia   . Murmur   . OSA (obstructive sleep apnea)    s/p UPPP  . Plantar fasciitis   . Pneumonia due to COVID-19 virus   . Prediabetes   . Reflux esophagitis   . Stomach ulcer    from PCP  . Urinary hesitancy 09/30/2015    Past Surgical History:  Procedure Laterality Date  . [place] STUDY N/A 02/28/2016   Procedure: [place] STUDY;  Surgeon: Jerene Bears, MD;  Location: [facility];  Service: Gastroenterology;  Laterality: N/A;  . ESOPHAGEAL MANOMETRY N/A 09/01/2013   Procedure: ESOPHAGEAL MANOMETRY (EM);  Surgeon: Sable Feil, MD;  Location: [facility];  Service: Endoscopy;  Laterality: N/A;  . ESOPHAGEAL MANOMETRY N/A 02/28/2016   Procedure: ESOPHAGEAL MANOMETRY (EM);  Surgeon: Jerene Bears, MD;  Location: [facility];  Service: Gastroenterology;   Laterality: N/A;  . FOOT TENDON TRANSFER Right   . INGUINAL HERNIA REPAIR    . TONSILLECTOMY    . UMBILICAL HERNIA REPAIR    . UVULECTOMY      Family History  Problem Relation Age of Onset  . Dementia Mother   . Diabetes Mother   . Hypertension Mother   . Heart failure Mother   . Stroke Mother   . Obesity Mother   . Hypertension Other        siblings  . Diabetes Sister   . Cancer Brother        lung cancer smoker  . Heart attack Maternal Grandmother   . Diabetes Sister   . Pancreatic disease Sister   . Obesity Father   . Colon cancer Neg Hx     Social History   Socioeconomic History  . Marital status: Married    Spouse name: [name]  . Number of children: Not on file  . Years of education: Not on file  . Highest education level: Not on file  Occupational History  . Occupation: Nature conservation officer: [employer]  Tobacco Use  . Smoking status: Never Smoker  . Smokeless tobacco: Never Used  Substance and Sexual Activity  . Alcohol use: No  . Drug use: No  . Sexual activity: Yes    Partners: Female  Other Topics  Concern  . Not on file  Social History Narrative   Tobacco Use - No.    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 Difficulty of Paying Living Expenses:   Food Insecurity:   . Worried About Charity fundraiser in the Last Year:   . Arboriculturist in the Last Year:   Transportation Needs:   . Film/video editor (Medical):   [name] Lack of Transportation (Non-Medical):   Physical Activity:   . Days of Exercise per Week:   . Minutes of Exercise per Session:   Stress:   . Feeling of Stress :   Social Connections:   .  Frequency of Communication with Friends and Family:   . Frequency of Social Gatherings with Friends and Family:   . Attends Religious Services:   . Active Member of Clubs or Organizations:   . Attends Archivist Meetings:   [name] Marital Status:   Intimate Partner Violence:   . Fear of Current or Ex-Partner:   . Emotionally Abused:   [name] Physically Abused:   . Sexually Abused:     Outpatient Medications Prior to Visit  Medication Sig Dispense Refill  . acetaminophen (TYLENOL) 500 MG tablet Take 1 tablet (500 mg total) by mouth every 6 (six) hours as needed. 60 tablet 0  . amLODipine (NORVASC) 10 MG tablet Take 1 tablet (10 mg total) by mouth daily. 90 tablet 1  . aspirin EC 81 MG tablet Take 1 tablet (81 mg total) by mouth 2 (two) times daily. 60 tablet 3  . Bayer Microlet Lancets lancets Test blood sugars twice daily 100 each 0  . Blood Glucose Monitoring Suppl (ACCU-CHEK GUIDE ME) w/Device KIT     . Blood Glucose Monitoring Suppl (CONTOUR NEXT MONITOR) w/Device KIT 1 kit by Does not apply route 2 (two) times daily. 1 kit 0  . calcium carbonate (TUMS EX) 750 MG chewable tablet Chew 2 tablets by mouth daily as needed for heartburn.    . famotidine (PEPCID) 20 MG tablet Take 1 tablet (20 mg total) by mouth 2 (two) times daily. 60 tablet 3  . fluticasone (FLONASE) 50 MCG/ACT nasal spray Place 2 sprays into both nostrils daily. 16 g 6  . glucose blood (CONTOUR NEXT TEST) test strip Test blood sugars twice daily 100 each 0  . hyoscyamine (LEVSIN SL) 0.125 MG SL tablet Place 1-2 tablets (0.125-0.25 mg total) under the tongue 3 (three) times daily as needed. 180 tablet 7  . ibuprofen (ADVIL) 600 MG tablet Take 600 mg by mouth 3 (three) times daily.    . metFORMIN (GLUCOPHAGE) 500 MG tablet Take 1 tablet (500 mg total) by mouth daily with breakfast. 90 tablet 1  . Misc. Devices (PULSE OXIMETER FOR FINGER) MISC 1 Device by Does not apply route as needed (SOB, fatigue, headache patient with  COVID). 1 each 0  . Multiple Vitamins-Minerals (MULTIVITAMIN WITH MINERALS) tablet Take 1 tablet by mouth daily. 100 tablet 5  . omeprazole (  PRILOSEC) 40 MG capsule Take 1 capsule by mouth once daily in the morning 30 capsule 3  . phenazopyridine (PYRIDIUM) 200 MG tablet Take 1 tablet (200 mg total) by mouth 3 (three) times daily. 6 tablet 0  . PROAIR HFA 108 (90 Base) MCG/ACT inhaler Inhale 1-2 puffs into the lungs every 6 (six) hours as needed for wheezing or shortness of breath. 18 g 5  . SM DIGITAL THERMOMETER MISC     . Spacer/Aero-Hold Chamber [employer] Use as directed with inhaler 1 each 1  . tadalafil (CIALIS) 5 MG tablet Take 1 tablet (5 mg total) by mouth daily as needed for erectile dysfunction. 30 tablet 2  . Thermometer MISC 1 Device by Does not apply route as needed. Patient with covid 1 Device 0  . tiZANidine (ZANAFLEX) 4 MG tablet Take 0.5-1 tablets (2-4 mg total) by mouth 2 (two) times daily as needed for muscle spasms. 40 tablet 0  . triamterene-hydrochlorothiazide (MAXZIDE-25) 37.5-25 MG tablet Take 1 tablet by mouth once daily 90 tablet 0  . carvedilol (COREG) 6.25 MG tablet Take 1 tablet (6.25 mg total) by mouth 2 (two) times daily. 60 tablet 0  . methylPREDNISolone (MEDROL) 4 MG tablet 5 tab po qd X 1d then 4 tab po qd X 1d then 3 tab po qd X 1d then 2 tab po qd then 1 tab po qd 15 tablet 0  . rosuvastatin (CRESTOR) 10 MG tablet Take 1 tablet by mouth once daily 30 tablet 6  . Vitamin D, Ergocalciferol, (DRISDOL) 1.25 MG (50000 UT) CAPS capsule TAKE 1 CAPSULE BY MOUTH ONCE A WEEK FOR  12  WEEKS 4 capsule 0  . methylPREDNISolone acetate (DEPO-MEDROL) injection 40 mg      No facility-administered medications prior to visit.    Allergies  Allergen Reactions  . Hydrocodone Itching  . Tramadol Itching    Review of Systems  Constitutional: Negative for fever and malaise/fatigue.  HENT: Negative for congestion.   Eyes: Negative for blurred vision.  Respiratory: Positive  for shortness of breath.   Cardiovascular: Negative for chest pain, palpitations and leg swelling.  Gastrointestinal: Positive for heartburn. Negative for abdominal pain, blood in stool and nausea.  Genitourinary: Positive for dysuria, flank pain, frequency and urgency.  Musculoskeletal: Negative for falls.  Skin: Negative for rash.  Neurological: Negative for dizziness, loss of consciousness and headaches.  Endo/Heme/Allergies: Negative for environmental allergies.  Psychiatric/Behavioral: Negative for depression. The patient is not nervous/anxious.        Objective:    Physical Exam Vitals and nursing note reviewed.  Constitutional:      General: He is not in acute distress.    Appearance: He is well-developed.  HENT:     Head: Normocephalic and atraumatic.     Nose: Nose normal.  Eyes:     General:        Right eye: No discharge.        Left eye: No discharge.  Cardiovascular:     Rate and Rhythm: Normal rate and regular rhythm.     Heart sounds: No murmur.  Pulmonary:     Effort: Pulmonary effort is normal.     Breath sounds: Normal breath sounds.  Abdominal:     General: Bowel sounds are normal.     Palpations: Abdomen is soft.     Tenderness: There is no abdominal tenderness.  Musculoskeletal:     Cervical back: Normal range of motion and neck supple.  Skin:  General: Skin is warm and dry.  Neurological:     Mental Status: He is alert and oriented to person, place, and time.     BP (!) 181/90 (BP Location: Right Arm, Cuff Size: Large)   Pulse 70   Temp 98.8 F (37.1 C) (Temporal)   Resp 12   Ht 6' (1.829 m)   Wt 272 lb 3.2 oz (123.5 kg)   SpO2 96%   BMI 36.92 kg/m  Wt Readings from Last 3 Encounters:  12/08/19 272 lb 3.2 oz (123.5 kg)  11/25/19 260 lb (117.9 kg)  11/07/19 268 lb (121.6 kg)    Diabetic Foot Exam - Simple   No data filed     Lab Results  Component Value Date   WBC 4.8 12/08/2019   HGB 16.0 12/08/2019   HCT 47.6 12/08/2019    PLT 192.0 12/08/2019   GLUCOSE 80 12/08/2019   CHOL 242 (H) 12/08/2019   TRIG 181.0 (H) 12/08/2019   HDL 40.00 12/08/2019   LDLDIRECT 162.0 01/10/2018   LDLCALC 166 (H) 12/08/2019   ALT 36 12/08/2019   AST 24 12/08/2019   NA 137 12/08/2019   K 3.7 12/08/2019   CL 103 12/08/2019   CREATININE 0.92 12/08/2019   BUN 10 12/08/2019   CO2 26 12/08/2019   TSH 2.43 12/08/2019   PSA 3.64 12/08/2019   HGBA1C 6.4 12/08/2019    Lab Results  Component Value Date   TSH 2.43 12/08/2019   Lab Results  Component Value Date   WBC 4.8 12/08/2019   HGB 16.0 12/08/2019   HCT 47.6 12/08/2019   MCV 88.7 12/08/2019   PLT 192.0 12/08/2019   Lab Results  Component Value Date   NA 137 12/08/2019   K 3.7 12/08/2019   CO2 26 12/08/2019   GLUCOSE 80 12/08/2019   BUN 10 12/08/2019   CREATININE 0.92 12/08/2019   BILITOT 0.4 12/08/2019   ALKPHOS 71 12/08/2019   AST 24 12/08/2019   ALT 36 12/08/2019   PROT 7.4 12/08/2019   ALBUMIN 4.5 12/08/2019   CALCIUM 9.7 12/08/2019   ANIONGAP 13 07/28/2019   GFR 104.49 12/08/2019   Lab Results  Component Value Date   CHOL 242 (H) 12/08/2019   Lab Results  Component Value Date   HDL 40.00 12/08/2019   Lab Results  Component Value Date   LDLCALC 166 (H) 12/08/2019   Lab Results  Component Value Date   TRIG 181.0 (H) 12/08/2019   Lab Results  Component Value Date   CHOLHDL 6 12/08/2019   Lab Results  Component Value Date   HGBA1C 6.4 12/08/2019       Assessment & Plan:   Problem List Items Addressed This Visit    Hyperlipidemia (Chronic)   Relevant Medications   carvedilol (COREG) 12.5 MG tablet   Other Relevant Orders   Lipid panel (Completed)   Essential hypertension (Chronic)    Not well controlled, increase Carvedilol to 12.5 mg po bid. Encouraged heart healthy diet such as the DASH diet and exercise as tolerated.       Relevant Medications   carvedilol (COREG) 12.5 MG tablet   Other Relevant Orders   CBC with  Differential/Platelet (Completed)   Comprehensive metabolic panel (Completed)   TSH (Completed)   Anxiety state    Given a refill on Wellbutrin       Relevant Medications   buPROPion (WELLBUTRIN SR) 150 MG 12 hr tablet   Type 2 diabetes mellitus without complication, without long-term current  use of insulin ([name]) (Chronic)    hgba1c acceptable, minimize simple carbs. Increase exercise as tolerated. Continue current meds      Relevant Orders   Hemoglobin A1c (Completed)   Hiatal hernia with gastroesophageal reflux    Avoid offending foods, start probiotics. Do not eat large meals in late evening and consider raising head of bed.       RESOLVED: SOB (shortness of breath)   Relevant Orders   Ambulatory referral to Pulmonology   Shortness of breath on exertion    His SOB has worsened since his COVID infection. Referred to pulmonology for further consideration. Given a medrol dosepak to use prn if symptoms worsen      Vitamin D deficiency    Supplement and monitor       Relevant Orders   VITAMIN D 25 Hydroxy (Vit-D Deficiency, Fractures) (Completed)   VITAMIN D 25 Hydroxy (Vit-D Deficiency, Fractures)   Depression   Relevant Medications   buPROPion (WELLBUTRIN SR) 150 MG 12 hr tablet   Dysuria    Drinking plenty of water but still noting 2 weeks of frequency, dysuria, urgency, hesitancy      Relevant Orders   Urine Culture (Completed)   Ambulatory referral to Urology   Urinary frequency    Consider prostatitis. Started on Bactrim DS bid, probiotics and referred to urology for further consideration.      Relevant Orders   POCT Urinalysis Dipstick (Automated) (Completed)   Urine Culture (Completed)   Ambulatory referral to Urology   PSA (Completed)    Other Visit Diagnoses    Flank pain    -  Primary   Relevant Orders   POCT Urinalysis Dipstick (Automated) (Completed)   Urine Culture (Completed)   Ambulatory referral to Urology   CBC with Differential/Platelet  (Completed)   PSA (Completed)      I have discontinued Millie E. Brash's carvedilol. I am also having him start on sulfamethoxazole-trimethoprim and carvedilol. Additionally, I am having him maintain his acetaminophen, Contour Next Monitor, fluticasone, metFORMIN, triamterene-hydrochlorothiazide, calcium carbonate, famotidine, Pulse Oximeter For Finger, Thermometer, multivitamin with minerals, aspirin EC, Bayer Microlet Lancets, Contour Next Test, Spacer/Aero-Hold Chamber Bags, ibuprofen, tadalafil, ProAir HFA, hyoscyamine, tiZANidine, phenazopyridine, omeprazole, amLODipine, Accu-Chek Guide Me, SM Digital Thermometer, methylPREDNISolone, and buPROPion. We will stop administering methylPREDNISolone acetate.  Meds ordered this encounter  Medications  . sulfamethoxazole-trimethoprim (BACTRIM DS) 800-160 MG tablet    Sig: Take 1 tablet by mouth 2 (two) times daily.    Dispense:  28 tablet    Refill:  0  . methylPREDNISolone (MEDROL) 4 MG tablet    Sig: 5 tab po qd X 1d then 4 tab po qd X 1d then 3 tab po qd X 1d then 2 tab po qd then 1 tab po qd    Dispense:  15 tablet    Refill:  0  . buPROPion (WELLBUTRIN SR) 150 MG 12 hr tablet    Sig: Take 1 tablet (150 mg total) by mouth every morning.    Dispense:  90 tablet    Refill:  1  . carvedilol (COREG) 12.5 MG tablet    Sig: Take 1 tablet (12.5 mg total) by mouth 2 (two) times daily with a meal.    Dispense:  180 tablet    Refill:  1     Penni Homans, MD

## 2019-12-12 NOTE — Progress Notes (Signed)
[name] Foster - 54 y.o. male MRN [id number]  Date of birth: 10-19-1965  Office Visit Note: Visit Date: 11/25/2019 PCP: [name], MD Referred by: [name], MD  Subjective: Chief Complaint  Patient presents with  . Lower Back - Pain   HPI:  [name] Foster is a 54 y.o. male who comes in today At the request of Dr. Eunice Blase for possible lumbar spine intervention such as diagnostic facet block.  Patient has left-sided low back and buttock pain.  He has fairly normal MRI for his age with mild spondylitic change of the facet joints particularly at L5-S1.  I did elect to complete diagnostic left L5-S1 facet joint block.  He will continue to follow-up with Dr. Junius Roads.  ROS Otherwise per HPI.  Assessment & Plan: Visit Diagnoses:  1. Spondylosis without myelopathy or radiculopathy, lumbar region     Plan: No additional findings.   Meds & Orders:  Meds ordered this encounter  Medications  . DISCONTD: methylPREDNISolone acetate (DEPO-MEDROL) injection 40 mg    Orders Placed This Encounter  Procedures  . Facet Injection  . XR C-ARM NO REPORT    Follow-up: No follow-ups on file.   Procedures: No procedures performed  Lumbar Facet Joint Intra-Articular Injection(s) with Fluoroscopic Guidance  Patient: [name] Foster      Date of Birth: [birth year] MRN: [id number] PCP: [name], MD      Visit Date: 11/25/2019   Universal Protocol:    Date/Time: 11/25/2019  Consent Given By: the patient  Position: PRONE   Additional Comments: Vital signs were monitored before and after the procedure. Patient was prepped and draped in the usual sterile fashion. The correct patient, procedure, and site was verified.   Injection Procedure Details:  Procedure Site One Meds Administered:  Meds ordered this encounter  Medications  . DISCONTD: methylPREDNISolone acetate (DEPO-MEDROL) injection 40 mg     Laterality: Left  Location/Site:  L5-S1  Needle size: 22  guage  Needle type: Spinal  Needle Placement: Articular  Findings:  -Comments: Excellent flow of contrast producing a partial arthrogram.  Procedure Details: The fluoroscope beam is vertically oriented in AP, and the inferior recess is visualized beneath the lower pole of the inferior apophyseal process, which represents the target point for needle insertion. When direct visualization is difficult the target point is located at the medial projection of the vertebral pedicle. The region overlying each aforementioned target is locally anesthetized with a 1 to 2 ml. volume of 1% Lidocaine without Epinephrine.   The spinal needle was inserted into each of the above mentioned facet joints using biplanar fluoroscopic guidance. A 0.25 to 0.5 ml. volume of Isovue-250 was injected and a partial facet joint arthrogram was obtained. A single spot film was obtained of the resulting arthrogram.    One to 1.25 ml of the steroid/anesthetic solution was then injected into each of the facet joints noted above.   Additional Comments:  The patient tolerated the procedure well Dressing: 2 x 2 sterile gauze and Band-Aid    Post-procedure details: Patient was observed during the procedure. Post-procedure instructions were reviewed.  Patient left the clinic in stable condition.     Clinical History: MRI LUMBAR SPINE WITHOUT CONTRAST  TECHNIQUE: Multiplanar, multisequence MR imaging of the lumbar spine was performed. No intravenous contrast was administered.  COMPARISON:  Radiography 10/13/2019.  FINDINGS: Segmentation:  5 lumbar type vertebral bodies.  Alignment:  Normal  Vertebrae:  Normal  Conus medullaris and  cauda equina: Conus extends to the L1 level. Conus and cauda equina appear normal.  Paraspinal and other soft tissues: Normal  Disc levels:  No evidence of accelerated disc degeneration, disc bulge or disc herniation. No stenosis of the canal or foramina. No  facet arthropathy.  IMPRESSION: Normal lumbar spine MRI for age.   Electronically Signed   By: Nelson Chimes M.D.   On: 11/02/2019 04:35     Objective:  VS:  HT:    WT:   BMI:     BP:(!) 164/107  HR:82bpm  TEMP: ( )  RESP:  Physical Exam  Ortho Exam Imaging: No results found.

## 2019-12-12 NOTE — Procedures (Signed)
Lumbar Facet Joint Intra-Articular Injection(s) with Fluoroscopic Guidance  Patient: [name] Foster      Date of Birth: 08-12-1966 MRN: [id number] PCP: [name], MD      Visit Date: 11/25/2019   Universal Protocol:    Date/Time: 11/25/2019  Consent Given By: the patient  Position: PRONE   Additional Comments: Vital signs were monitored before and after the procedure. Patient was prepped and draped in the usual sterile fashion. The correct patient, procedure, and site was verified.   Injection Procedure Details:  Procedure Site One Meds Administered:  Meds ordered this encounter  Medications  . DISCONTD: methylPREDNISolone acetate (DEPO-MEDROL) injection 40 mg     Laterality: Left  Location/Site:  L5-S1  Needle size: 22 guage  Needle type: Spinal  Needle Placement: Articular  Findings:  -Comments: Excellent flow of contrast producing a partial arthrogram.  Procedure Details: The fluoroscope beam is vertically oriented in AP, and the inferior recess is visualized beneath the lower pole of the inferior apophyseal process, which represents the target point for needle insertion. When direct visualization is difficult the target point is located at the medial projection of the vertebral pedicle. The region overlying each aforementioned target is locally anesthetized with a 1 to 2 ml. volume of 1% Lidocaine without Epinephrine.   The spinal needle was inserted into each of the above mentioned facet joints using biplanar fluoroscopic guidance. A 0.25 to 0.5 ml. volume of Isovue-250 was injected and a partial facet joint arthrogram was obtained. A single spot film was obtained of the resulting arthrogram.    One to 1.25 ml of the steroid/anesthetic solution was then injected into each of the facet joints noted above.   Additional Comments:  The patient tolerated the procedure well Dressing: 2 x 2 sterile gauze and Band-Aid    Post-procedure details: Patient was  observed during the procedure. Post-procedure instructions were reviewed.  Patient left the clinic in stable condition.

## 2019-12-15 ENCOUNTER — Other Ambulatory Visit: Payer: Self-pay | Admitting: *Deleted

## 2019-12-16 ENCOUNTER — Other Ambulatory Visit: Payer: Self-pay

## 2019-12-16 ENCOUNTER — Ambulatory Visit (INDEPENDENT_AMBULATORY_CARE_PROVIDER_SITE_OTHER): Payer: Commercial Managed Care - PPO | Admitting: Family Medicine

## 2019-12-16 ENCOUNTER — Encounter (INDEPENDENT_AMBULATORY_CARE_PROVIDER_SITE_OTHER): Payer: Self-pay | Admitting: Family Medicine

## 2019-12-16 VITALS — BP 147/88 | HR 66 | Temp 97.7°F | Ht 71.0 in | Wt 266.0 lb

## 2019-12-16 DIAGNOSIS — R5383 Other fatigue: Secondary | ICD-10-CM

## 2019-12-16 DIAGNOSIS — I10 Essential (primary) hypertension: Secondary | ICD-10-CM | POA: Diagnosis not present

## 2019-12-16 DIAGNOSIS — Z1331 Encounter for screening for depression: Secondary | ICD-10-CM

## 2019-12-16 DIAGNOSIS — R7303 Prediabetes: Secondary | ICD-10-CM

## 2019-12-16 DIAGNOSIS — Z9189 Other specified personal risk factors, not elsewhere classified: Secondary | ICD-10-CM | POA: Diagnosis not present

## 2019-12-16 DIAGNOSIS — E7849 Other hyperlipidemia: Secondary | ICD-10-CM

## 2019-12-16 DIAGNOSIS — R0602 Shortness of breath: Secondary | ICD-10-CM | POA: Diagnosis not present

## 2019-12-16 DIAGNOSIS — Z0289 Encounter for other administrative examinations: Secondary | ICD-10-CM

## 2019-12-16 DIAGNOSIS — Z6837 Body mass index (BMI) 37.0-37.9, adult: Secondary | ICD-10-CM

## 2019-12-17 LAB — COMPREHENSIVE METABOLIC PANEL
ALT: 45 IU/L — ABNORMAL HIGH (ref 0–44)
AST: 30 IU/L (ref 0–40)
Albumin/Globulin Ratio: 1.5 (ref 1.2–2.2)
Albumin: 4.7 g/dL (ref 3.8–4.9)
Alkaline Phosphatase: 79 IU/L (ref 39–117)
BUN/Creatinine Ratio: 13 (ref 9–20)
BUN: 13 mg/dL (ref 6–24)
Bilirubin Total: 0.4 mg/dL (ref 0.0–1.2)
CO2: 18 mmol/L — ABNORMAL LOW (ref 20–29)
Calcium: 9.8 mg/dL (ref 8.7–10.2)
Chloride: 100 mmol/L (ref 96–106)
Creatinine, Ser: 1 mg/dL (ref 0.76–1.27)
GFR calc Af Amer: 100 mL/min/{1.73_m2} (ref >59)
GFR calc non Af Amer: 86 mL/min/{1.73_m2} (ref >59)
Globulin, Total: 3.1 g/dL (ref 1.5–4.5)
Glucose: 100 mg/dL — ABNORMAL HIGH (ref 65–99)
Potassium: 4 mmol/L (ref 3.5–5.2)
Sodium: 138 mmol/L (ref 134–144)
Total Protein: 7.8 g/dL (ref 6.0–8.5)

## 2019-12-17 LAB — VITAMIN B12: Vitamin B-12: 913 pg/mL (ref 232–1245)

## 2019-12-17 LAB — CBC WITH DIFFERENTIAL/PLATELET
Basophils Absolute: 0 10*3/uL (ref 0.0–0.2)
Basos: 0 %
EOS (ABSOLUTE): 0.1 10*3/uL (ref 0.0–0.4)
Eos: 2 %
Hematocrit: 51.7 % — ABNORMAL HIGH (ref 37.5–51.0)
Hemoglobin: 17.4 g/dL (ref 13.0–17.7)
Immature Grans (Abs): 0 10*3/uL (ref 0.0–0.1)
Immature Granulocytes: 1 %
Lymphocytes Absolute: 2 10*3/uL (ref 0.7–3.1)
Lymphs: 38 %
MCH: 29.4 pg (ref 26.6–33.0)
MCHC: 33.7 g/dL (ref 31.5–35.7)
MCV: 87 fL (ref 79–97)
Monocytes Absolute: 0.4 10*3/uL (ref 0.1–0.9)
Monocytes: 8 %
Neutrophils Absolute: 2.8 10*3/uL (ref 1.4–7.0)
Neutrophils: 51 %
Platelets: 233 10*3/uL (ref 150–450)
RBC: 5.92 x10E6/uL — ABNORMAL HIGH (ref 4.14–5.80)
RDW: 13.5 % (ref 11.6–15.4)
WBC: 5.4 10*3/uL (ref 3.4–10.8)

## 2019-12-17 LAB — LIPID PANEL
Chol/HDL Ratio: 4.7 ratio (ref 0.0–5.0)
Cholesterol, Total: 231 mg/dL — ABNORMAL HIGH (ref 100–199)
HDL: 49 mg/dL (ref >39)
LDL Chol Calc (NIH): 156 mg/dL — ABNORMAL HIGH (ref 0–99)
Triglycerides: 143 mg/dL (ref 0–149)
VLDL Cholesterol Cal: 26 mg/dL (ref 5–40)

## 2019-12-17 LAB — TSH: TSH: 1.88 u[IU]/mL (ref 0.450–4.500)

## 2019-12-17 LAB — VITAMIN D 25 HYDROXY (VIT D DEFICIENCY, FRACTURES): Vit D, 25-Hydroxy: 21.7 ng/mL — ABNORMAL LOW (ref 30.0–100.0)

## 2019-12-17 LAB — FOLATE: Folate: >20 ng/mL (ref >3.0)

## 2019-12-17 LAB — T4, FREE: Free T4: 1.39 ng/dL (ref 0.82–1.77)

## 2019-12-17 LAB — HEMOGLOBIN A1C
Est. average glucose Bld gHb Est-mCnc: 143 mg/dL
Hgb A1c MFr Bld: 6.6 % — ABNORMAL HIGH (ref 4.8–5.6)

## 2019-12-17 LAB — T3, FREE: T3, Free: 3.8 pg/mL (ref 2.0–4.4)

## 2019-12-17 LAB — INSULIN, RANDOM: INSULIN: 34.4 u[IU]/mL — ABNORMAL HIGH (ref 2.6–24.9)

## 2019-12-17 NOTE — Progress Notes (Signed)
Chief Complaint:   OBESITY [name] Foster (MR# [id number]) is a 54 y.o. male who presents for evaluation and treatment of obesity and related comorbidities. Current BMI is Body mass index is 37.1 kg/m. [name] Foster has been struggling with his weight for many years and has been unsuccessful in either losing weight, maintaining weight loss, or reaching his healthy weight goal.  [name] Foster had COVID-19 virus in November 2020, and he required 5 days hospitalization for recovery. I encouraged him to receive the COVID-19 vaccinations. He was injured at work, and has been out of work for [employer] year.  [name] Foster is currently in the action stage of change and ready to dedicate time achieving and maintaining a healthier weight. [name] Foster is interested in becoming our patient and working on intensive lifestyle modifications including (but not limited to) diet and exercise for weight loss.  [name] Foster's habits were reviewed today and are as follows: His family eats meals together, he thinks his family will eat healthier with him, his desired weight loss is 66 lbs, his heaviest weight ever was 270 pounds, he has significant food cravings issues, he snacks frequently in the evenings, he wakes up frequently in the middle of the night to eat, he skips meals frequently, he frequently makes poor food choices, he has problems with excessive hunger, he frequently eats larger portions than normal and he struggles with emotional eating.  Depression Screen [name] Foster's Food and Mood (modified PHQ-9) score was 14.  Depression screen PHQ 2/9 12/16/2019  Decreased Interest 3  Down, Depressed, Hopeless 1  PHQ - 2 Score 4  Altered sleeping 3  Tired, decreased energy 3  Change in appetite 0  Feeling bad or failure about yourself  2  Trouble concentrating 1  Moving slowly or fidgety/restless 1  Suicidal thoughts 0  PHQ-9 Score 14  Difficult doing work/chores Not difficult at all  Some recent data might be hidden   Subjective:   1.  Other fatigue [name] Foster admits to daytime somnolence and admits to waking up still tired. Patent has a history of symptoms of daytime fatigue and morning headache. Berlie generally gets 8 hours of sleep per night, and states that he has nightime awakenings. Snoring is present. Apneic episodes are present. Epworth Sleepiness Score is 8.  2. Shortness of breath on exertion [name] Foster notes increasing shortness of breath with exercising and seems to be worsening over time with weight gain. He notes getting out of breath sooner with activity than he used to. This has not gotten worse recently. Eugean denies shortness of breath at rest or orthopnea.  3. Uncontrolled hypertension [name] Foster's blood pressure is elevated at his office visit today. He is currently on amlodipine 10 mg q daily, carvedilol 6.25 mg BID, Maxide 37.5 mg-25 mg q daily.  4. Pre-diabetes [name] Foster is currently taking metformin 500 mg with breakfast, and is tolerating it well.  5. Other hyperlipidemia [name] Foster is currently on rosuvastatin 10 mg q daily, and is tolerating it well.  6. At risk for heart disease [name] Foster is at a higher than average risk for cardiovascular disease due to obesity, uncontrolled hypertension, pre-diabetes, and hyperlipidemia.   Assessment/Plan:   1. Other fatigue [place] does feel that his weight is causing his energy to be lower than it should be. Fatigue may be related to obesity, depression or many other causes. Labs will be ordered, and in the meanwhile, [name] Foster will focus on self care including making healthy food choices, increasing physical activity and focusing on stress reduction.  -  EKG 12-Lead - VITAMIN D 25 Hydroxy (Vit-D Deficiency, Fractures) - Folate - Vitamin B12 - CBC with Differential/Platelet - TSH - T4, free - T3, free  2. Shortness of breath on exertion [name] Foster does feel that he gets out of breath more easily that he used to when he exercises. Aadin's shortness of breath appears to be  obesity related and exercise induced. He has agreed to work on weight loss and gradually increase exercise to treat his exercise induced shortness of breath. Will continue to monitor closely.  3. Uncontrolled hypertension [name] Foster will start his Category 3 meal plan, and will work on healthy weight loss and exercise to improve blood pressure control. He will continue his current anti-hypertensive regimen. We will watch for signs of hypotension as he continues his lifestyle modifications.we will check labs today.  - Comprehensive metabolic panel  4. Pre-diabetes [name] Foster will start his Category 3 meal plan, and will continue to work on weight loss, exercise, and decreasing simple carbohydrates to help decrease the risk of diabetes. We will check labs today.  - Insulin, random - Hemoglobin A1c - Comprehensive metabolic panel  5. Other hyperlipidemia Cardiovascular risk and specific lipid/LDL goals reviewed. We discussed several lifestyle modifications today. [name] Foster will start his Category 3 meal plan, and will continue to work on exercise and weight loss efforts. We will check labs today. Orders and follow up as documented in patient record.   - Lipid panel  6. Depression screening [name] Foster had a positive depression screening. Depression is commonly associated with obesity and often results in emotional eating behaviors. We will monitor this closely and work on CBT to help improve the non-hunger eating patterns. Referral to Psychology may be required if no improvement is seen as he continues in our clinic.  7. At risk for heart disease [name] Foster was given approximately 30 minutes of coronary artery disease prevention counseling today. He is 55 y.o. male and has risk factors for heart disease including obesity, uncontrolled hypertension, pre-diabetes, and hyperlipidemia. We discussed intensive lifestyle modifications today with an emphasis on specific weight loss instructions and strategies.    Repetitive spaced learning was employed today to elicit superior memory formation and behavioral change.  8. Class 2 severe obesity with serious comorbidity and body mass index (BMI) of 37.0 to 37.9 in adult, unspecified obesity type ([name]) [name] Foster is currently in the action stage of change and his goal is to continue with weight loss efforts. I recommend [name] Foster begin the structured treatment plan as follows:  He has agreed to the Category 3 Plan.  Exercise goals: As is, continue current regimen, no high intensity.   Behavioral modification strategies: increasing lean protein intake, decreasing simple carbohydrates, no skipping meals and meal planning and cooking strategies.  He was informed of the importance of frequent follow-up visits to maximize his success with intensive lifestyle modifications for his multiple health conditions. He was informed we would discuss his lab results at his next visit unless there is a critical issue that needs to be addressed sooner. [name] Foster agreed to keep his next visit at the agreed upon time to discuss these results.  Objective:   Blood pressure (!) 147/88, pulse 66, temperature 97.7 F (36.5 C), temperature source Oral, height 5\' 11"  (1.803 m), weight 266 lb (120.7 kg), SpO2 97 %. Body mass index is 37.1 kg/m.  EKG: Normal sinus rhythm, rate 67 BPM.  Indirect Calorimeter completed today shows a VO2 of 308 and a REE of 2142.  His calculated basal metabolic rate  is 2440 thus his basal metabolic rate is worse than expected.  General: Cooperative, alert, well developed, in no acute distress. HEENT: Conjunctivae and lids unremarkable. Cardiovascular: Regular rhythm.  Lungs: Normal work of breathing. Neurologic: No focal deficits.   Lab Results  Component Value Date   CREATININE 1.00 12/16/2019   BUN 13 12/16/2019   NA 138 12/16/2019   K 4.0 12/16/2019   CL 100 12/16/2019   CO2 18 (L) 12/16/2019   Lab Results  Component Value Date   ALT 45 (H)  12/16/2019   AST 30 12/16/2019   ALKPHOS 79 12/16/2019   BILITOT 0.4 12/16/2019   Lab Results  Component Value Date   HGBA1C 6.6 (H) 12/16/2019   HGBA1C 6.4 12/08/2019   HGBA1C 6.3 (H) 07/24/2019   HGBA1C 6.3 10/14/2018   HGBA1C 6.1 01/10/2018   Lab Results  Component Value Date   INSULIN 34.4 (H) 12/16/2019   INSULIN 28.3 (H) 11/27/2016   Lab Results  Component Value Date   TSH 1.880 12/16/2019   Lab Results  Component Value Date   CHOL 231 (H) 12/16/2019   HDL 49 12/16/2019   LDLCALC 156 (H) 12/16/2019   LDLDIRECT 162.0 01/10/2018   TRIG 143 12/16/2019   CHOLHDL 4.7 12/16/2019   Lab Results  Component Value Date   WBC 5.4 12/16/2019   HGB 17.4 12/16/2019   HCT 51.7 (H) 12/16/2019   MCV 87 12/16/2019   PLT 233 12/16/2019   Lab Results  Component Value Date   FERRITIN 2,038 (H) 07/23/2019   Attestation Statements:   Reviewed by clinician on day of visit: allergies, medications, problem list, medical history, surgical history, family history, social history, and previous encounter notes.   I, Trixie Dredge, am acting as transcriptionist for Dennard Nip, MD.  I have reviewed the above documentation for accuracy and completeness, and I agree with the above. - Dennard Nip, MD

## 2019-12-22 ENCOUNTER — Other Ambulatory Visit: Payer: Self-pay | Admitting: Family Medicine

## 2019-12-22 DIAGNOSIS — E559 Vitamin D deficiency, unspecified: Secondary | ICD-10-CM

## 2019-12-23 ENCOUNTER — Telehealth: Payer: Self-pay

## 2019-12-23 DIAGNOSIS — I10 Essential (primary) hypertension: Secondary | ICD-10-CM

## 2019-12-23 DIAGNOSIS — E559 Vitamin D deficiency, unspecified: Secondary | ICD-10-CM

## 2019-12-23 NOTE — Telephone Encounter (Signed)
Patient called in to see if Dr. Charlett Blake could send in a prescription for a 90 day supply  For VITAMIN D) 50 MCG (2000 UT) CAPS [id number]   rosuvastatin (CRESTOR) 20 MG tablet [id number]  amLODipine (NORVASC) 10 MG tablet [id number]   omeprazole (PRILOSEC) 40 MG capsule [id number]     triamterene-hydrochlorothiazide ([id number]) 37.5-25 MG tablet [id number]    [place], [place] [address] [place] [address]  Phone:  [phone] Fax:  [phone]  DEA #:  --

## 2019-12-25 MED ORDER — ROSUVASTATIN CALCIUM 20 MG PO TABS: 20.0000 mg | ORAL_TABLET | Freq: Every day | ORAL | 1 refills | Status: DC

## 2019-12-25 MED ORDER — AMLODIPINE BESYLATE 10 MG PO TABS: 10.0000 mg | ORAL_TABLET | Freq: Every day | ORAL | 1 refills | Status: DC

## 2019-12-25 MED ORDER — TRIAMTERENE-HCTZ 37.5-25 MG PO TABS: 1.0000 | ORAL_TABLET | Freq: Every day | ORAL | 1 refills | Status: DC

## 2019-12-25 MED ORDER — VITAMIN D (ERGOCALCIFEROL) 1.25 MG (50000 UNIT) PO CAPS: ORAL_CAPSULE | ORAL | 0 refills | Status: DC

## 2019-12-25 MED ORDER — OMEPRAZOLE 40 MG PO CPDR: DELAYED_RELEASE_CAPSULE | ORAL | 1 refills | Status: DC

## 2019-12-25 NOTE — Telephone Encounter (Signed)
Medication sent to pharmacy for patient.  

## 2019-12-30 ENCOUNTER — Ambulatory Visit (INDEPENDENT_AMBULATORY_CARE_PROVIDER_SITE_OTHER): Payer: Commercial Managed Care - PPO | Admitting: Family Medicine

## 2019-12-30 ENCOUNTER — Other Ambulatory Visit: Payer: Self-pay

## 2019-12-30 ENCOUNTER — Encounter (INDEPENDENT_AMBULATORY_CARE_PROVIDER_SITE_OTHER): Payer: Self-pay | Admitting: Family Medicine

## 2019-12-30 VITALS — BP 126/79 | HR 68 | Temp 97.9°F | Ht 71.0 in | Wt 258.0 lb

## 2019-12-30 DIAGNOSIS — R7989 Other specified abnormal findings of blood chemistry: Secondary | ICD-10-CM

## 2019-12-30 DIAGNOSIS — E118 Type 2 diabetes mellitus with unspecified complications: Secondary | ICD-10-CM

## 2019-12-30 DIAGNOSIS — E1159 Type 2 diabetes mellitus with other circulatory complications: Secondary | ICD-10-CM

## 2019-12-30 DIAGNOSIS — Z9189 Other specified personal risk factors, not elsewhere classified: Secondary | ICD-10-CM

## 2019-12-30 DIAGNOSIS — E1169 Type 2 diabetes mellitus with other specified complication: Secondary | ICD-10-CM

## 2019-12-30 DIAGNOSIS — Z6836 Body mass index (BMI) 36.0-36.9, adult: Secondary | ICD-10-CM

## 2019-12-30 DIAGNOSIS — E785 Hyperlipidemia, unspecified: Secondary | ICD-10-CM

## 2019-12-30 DIAGNOSIS — E559 Vitamin D deficiency, unspecified: Secondary | ICD-10-CM

## 2019-12-30 DIAGNOSIS — I10 Essential (primary) hypertension: Secondary | ICD-10-CM

## 2019-12-30 MED ORDER — VITAMIN D (ERGOCALCIFEROL) 1.25 MG (50000 UNIT) PO CAPS: ORAL_CAPSULE | ORAL | 0 refills | Status: DC

## 2019-12-30 MED ORDER — METFORMIN HCL 500 MG PO TABS: 500.0000 mg | ORAL_TABLET | Freq: Every day | ORAL | 0 refills | Status: DC

## 2019-12-31 NOTE — Progress Notes (Signed)
Chief Complaint:   OBESITY [name] Foster is here to discuss his progress with his obesity treatment plan along with follow-up of his obesity related diagnoses. [name] Foster is on the Category 3 Plan and states he is following his eating plan approximately 85% of the time. [name] Foster states he is doing yard work, walking, and on the treadmill for 30-60 minutes 2 times per week.  Today's visit was #: 2 Starting weight: 266 lbs Starting date: 12/16/2019 Today's weight: 258 lbs Today's date: 12/30/2019 Total lbs lost to date: 8 Total lbs lost since last in-office visit: 8  Interim History: [name] Foster has done very well with weight loss on his Category 3 plan. His hunger is controlled in the daytime but increases in the evening. He does well eating all of his food and is exercising but not excessively.  Subjective:   1. DM (diabetes mellitus), type 2 with complications ([name] Foster) [name] Foster levels are worsening. His recent A1c has increased to 6.6. He had been off of metformin and tried to restart but lost his prescription. He notes evening polyphagia. I discussed labs with the patient today.  2. Vitamin D deficiency [name] Foster Vit D is not at goal. He needs Vit D refill due to losing his prescription. I discussed labs with the patient today.  3. Hyperlipidemia associated with type 2 diabetes mellitus ([name] Foster) [name] Foster is on statin and his dose was recently increased. He denies chest pain or myalgias, and his level are not yet controlled. I discussed labs with the patient today.  4. Hypertension associated with type 2 diabetes mellitus ([name] Foster) [name] Foster blood pressure is well controlled on his medications. He is working on diet and weight loss.  5. Elevated LFTs [name] Foster recent ALT is mildly elevated at 45, which increased from his labs that were done approximately 1 week before. He has been taking increased Tylenol, but he doesn't think he is exceeding the normal dose. He denies abdominal pain or jaundice.  6. At risk  for heart disease [name] Foster is at a higher than average risk for cardiovascular disease due to obesity, hyperlipidemia, hypertension, and diabetes mellitus.   Assessment/Plan:   1. DM (diabetes mellitus), type 2 with complications ([name] Foster) Good blood sugar control is important to decrease the likelihood of diabetic complications such as nephropathy, neuropathy, limb loss, blindness, coronary artery disease, and death. Intensive lifestyle modification including diet, exercise and weight loss are the first line of treatment for diabetes. [name] Foster weill continue with diet and exercise, and we will refill metformin for 1 month.  - metFORMIN (GLUCOPHAGE) 500 MG tablet; Take 1 tablet (500 mg total) by mouth daily with breakfast.  Dispense: 30 tablet; Refill: 0  2. Vitamin D deficiency Low Vitamin D level contributes to fatigue and are associated with obesity, breast, and colon cancer. We will refill prescription Vitamin D for 1 month. [name] Foster will follow-up for routine testing of Vitamin D, at least 2-3 times per year to avoid over-replacement.  - Vitamin D, Ergocalciferol, (DRISDOL) 1.25 MG (50000 UNIT) CAPS capsule; TAKE 1 CAPSULE BY MOUTH ONCE A WEEK  Dispense: 4 capsule; Refill: 0  3. Hyperlipidemia associated with type 2 diabetes mellitus ([name] Foster) Cardiovascular risk and specific lipid/LDL goals reviewed. We discussed several lifestyle modifications today. [name] Foster will continue his medications, and will continue to work on diet, exercise and weight loss efforts. Orders and follow up as documented in patient record.   4. Hypertension associated with type 2 diabetes mellitus ([name] Foster) [name] Foster will continue working on healthy weight loss, diet, and  exercise to improve blood pressure control. We will watch for signs of hypotension as he continues his lifestyle modifications.  5. Elevated LFTs We discussed the likely diagnosis of non-alcoholic fatty liver disease today and how this condition is obesity related. [name] Foster  was educated the importance of weight loss. [name] Foster agreed to continue with his weight loss efforts with healthier diet and exercise as an essential part of his treatment plan. [name] Foster is to take Tylenol sporadically and we will recheck labs in 3 months.  6. At risk for heart disease [name] Foster was given approximately 30 minutes of coronary artery disease prevention counseling today. He is 54 y.o. male and has risk factors for heart disease including obesity, hyperlipidemia, hypertension, and diabetes mellitus. We discussed intensive lifestyle modifications today with an emphasis on specific weight loss instructions and strategies.   Repetitive spaced learning was employed today to elicit superior memory formation and behavioral change.  7. Class 2 severe obesity with serious comorbidity and body mass index (BMI) of 36.0 to 36.9 in adult, unspecified obesity type ([name] Foster) [name] Foster is currently in the action stage of change. As such, his goal is to continue with weight loss efforts. He has agreed to the Category 3 Plan.   Exercise goals: [name] Foster is to increase exercise to 30 minutes 4-5 times per week.  Behavioral modification strategies: decreasing eating out and meal planning and cooking strategies.  [name] Foster has agreed to follow-up with our clinic in 2 weeks. He was informed of the importance of frequent follow-up visits to maximize his success with intensive lifestyle modifications for his multiple health conditions.   Objective:   Blood pressure 126/79, pulse 68, temperature 97.9 F (36.6 C), temperature source Oral, height 5\' 11"  (1.803 m), weight 258 lb (117 kg), SpO2 96 %. Body mass index is 35.98 kg/m.  General: Cooperative, alert, well developed, in no acute distress. HEENT: Conjunctivae and lids unremarkable. Cardiovascular: Regular rhythm.  Lungs: Normal work of breathing. Neurologic: No focal deficits.   Lab Results  Component Value Date   CREATININE 1.00 12/16/2019   BUN 13 12/16/2019     NA 138 12/16/2019   K 4.0 12/16/2019   CL 100 12/16/2019   CO2 18 (L) 12/16/2019   Lab Results  Component Value Date   ALT 45 (H) 12/16/2019   AST 30 12/16/2019   ALKPHOS 79 12/16/2019   BILITOT 0.4 12/16/2019   Lab Results  Component Value Date   HGBA1C 6.6 (H) 12/16/2019   HGBA1C 6.4 12/08/2019   HGBA1C 6.3 (H) 07/24/2019   HGBA1C 6.3 10/14/2018   HGBA1C 6.1 01/10/2018   Lab Results  Component Value Date   INSULIN 34.4 (H) 12/16/2019   INSULIN 28.3 (H) 11/27/2016   Lab Results  Component Value Date   TSH 1.880 12/16/2019   Lab Results  Component Value Date   CHOL 231 (H) 12/16/2019   HDL 49 12/16/2019   LDLCALC 156 (H) 12/16/2019   LDLDIRECT 162.0 01/10/2018   TRIG 143 12/16/2019   CHOLHDL 4.7 12/16/2019   Lab Results  Component Value Date   WBC 5.4 12/16/2019   HGB 17.4 12/16/2019   HCT 51.7 (H) 12/16/2019   MCV 87 12/16/2019   PLT 233 12/16/2019   Lab Results  Component Value Date   FERRITIN 2,038 (H) 07/23/2019   Attestation Statements:   Reviewed by clinician on day of visit: allergies, medications, problem list, medical history, surgical history, family history, social history, and previous encounter notes.   Wilhemena Durie, am acting  as transcriptionist for Dennard Nip, MD.  I have reviewed the above documentation for accuracy and completeness, and I agree with the above. -  Dennard Nip, MD

## 2020-01-08 ENCOUNTER — Other Ambulatory Visit: Payer: Self-pay

## 2020-01-08 ENCOUNTER — Ambulatory Visit (INDEPENDENT_AMBULATORY_CARE_PROVIDER_SITE_OTHER): Payer: Commercial Managed Care - PPO

## 2020-01-08 DIAGNOSIS — I10 Essential (primary) hypertension: Secondary | ICD-10-CM | POA: Diagnosis not present

## 2020-01-08 NOTE — Progress Notes (Signed)
Nursing note reviewed. Agree with documention and plan.  

## 2020-01-08 NOTE — Progress Notes (Signed)
BP Readings from Last 3 Encounters:  12/30/19 126/79  12/16/19 (!) 147/88  12/08/19 (!) 181/90   Patient here today for blood pressure check.  He is on Amlodipine 10 mg daily and Triamterene-HCTZ 97.5-25 mg.    His Blood pressure today is 131 /56 with a pulse of 71.  Per Dr. Charlett Blake continue same medication dose and monitor bp at home once a week.  Follow up with in the next 6 months.

## 2020-01-14 ENCOUNTER — Ambulatory Visit (INDEPENDENT_AMBULATORY_CARE_PROVIDER_SITE_OTHER): Payer: Commercial Managed Care - PPO | Admitting: Family Medicine

## 2020-01-14 ENCOUNTER — Other Ambulatory Visit: Payer: Self-pay

## 2020-01-14 ENCOUNTER — Institutional Professional Consult (permissible substitution): Payer: Commercial Managed Care - PPO | Admitting: Pulmonary Disease

## 2020-01-14 ENCOUNTER — Encounter (INDEPENDENT_AMBULATORY_CARE_PROVIDER_SITE_OTHER): Payer: Self-pay | Admitting: Family Medicine

## 2020-01-14 VITALS — BP 130/78 | HR 60 | Temp 97.9°F | Ht 71.0 in | Wt 259.0 lb

## 2020-01-14 DIAGNOSIS — Z9189 Other specified personal risk factors, not elsewhere classified: Secondary | ICD-10-CM

## 2020-01-14 DIAGNOSIS — E559 Vitamin D deficiency, unspecified: Secondary | ICD-10-CM

## 2020-01-14 DIAGNOSIS — E118 Type 2 diabetes mellitus with unspecified complications: Secondary | ICD-10-CM | POA: Diagnosis not present

## 2020-01-14 DIAGNOSIS — Z6836 Body mass index (BMI) 36.0-36.9, adult: Secondary | ICD-10-CM

## 2020-01-14 MED ORDER — METFORMIN HCL 500 MG PO TABS: 500.0000 mg | ORAL_TABLET | Freq: Two times a day (BID) | ORAL | 0 refills | Status: DC

## 2020-01-14 MED ORDER — VITAMIN D (ERGOCALCIFEROL) 1.25 MG (50000 UNIT) PO CAPS: ORAL_CAPSULE | ORAL | 0 refills | Status: DC

## 2020-01-14 NOTE — Progress Notes (Signed)
Chief Complaint:   OBESITY [name] Foster is here to discuss his progress with his obesity treatment plan along with follow-up of his obesity related diagnoses. [name] Foster is on the Category 3 Plan and states he is following his eating plan approximately 85% of the time. [name] Foster states he is cutting grass and doing house work for 15 minutes 3 times per week.  Today's visit was #: 3 Starting weight: 266 lbs Starting date: 12/16/2019 Today's weight: 259 lbs Today's date: 01/14/2020 Total lbs lost to date: 7 Total lbs lost since last in-office visit: 0  Interim History: [name] Foster reports increased hunger levels around 2100, and he has been trying to make healthier snack choices, such as popcorn, 100 calorie cookies, and cheese sticks. He estimates to consume >64 oz of water per day.  Subjective:   1. Vitamin D deficiency [name] Foster is tolerating prescription strength Vit D supplementation.  2. DM (diabetes mellitus), type 2 with complications ([name] Foster) [name] Foster's A1c on 12/16/2019 was 6.6 and insulin level on 12/16/2019 was 34.4. He started on metformin 500 mg with breakfast, and he denies GI upset. He notes late evening hunger has increased. We discussed adding a lunch dose of metformin.  3. At risk for diarrhea [name] Foster is at higher risk of diarrhea due to increased metformin dosage.  Assessment/Plan:   1. Vitamin D deficiency Low Vitamin D level contributes to fatigue and are associated with obesity, breast, and colon cancer. We will refill prescription Vitamin D for 1 month. [name] Foster will follow-up for routine testing of Vitamin D, at least 2-3 times per year to avoid over-replacement.  - Vitamin D, Ergocalciferol, (DRISDOL) 1.25 MG (50000 UNIT) CAPS capsule; TAKE 1 CAPSULE BY MOUTH ONCE A WEEK  Dispense: 4 capsule; Refill: 0  2. DM (diabetes mellitus), type 2 with complications ([name]) Good blood sugar control is important to decrease the likelihood of diabetic complications such as nephropathy, neuropathy, limb  loss, blindness, coronary artery disease, and death. Intensive lifestyle modification including diet, exercise and weight loss are the first line of treatment for diabetes. [name] Foster agreed to increase metformin to 500 mg BID with breakfast and lunch, with no refills.  - metFORMIN (GLUCOPHAGE) 500 MG tablet; Take 1 tablet (500 mg total) by mouth 2 (two) times daily with a meal.  Dispense: 60 tablet; Refill: 0  3. At risk for diarrhea [name] Foster was given approximately 15 minutes of diarrhea prevention counseling today. He is 54 y.o. male and has risk factors for diarrhea including medications and changes in diet. We discussed intensive lifestyle modifications today with an emphasis on specific weight loss instructions including dietary strategies.   Repetitive spaced learning was employed today to elicit superior memory formation and behavioral change.  4. Class 2 severe obesity with serious comorbidity and body mass index (BMI) of 36.0 to 36.9 in adult, unspecified obesity type ([name] Foster) [name] Foster is currently in the action stage of change. As such, his goal is to continue with weight loss efforts. He has agreed to the Category 3 Plan with additional breakfast options.   Exercise goals: As is.  Behavioral modification strategies: increasing lean protein intake and no skipping meals.  [name] Foster has agreed to follow-up with our clinic in 2 weeks. He was informed of the importance of frequent follow-up visits to maximize his success with intensive lifestyle modifications for his multiple health conditions.   Objective:   Blood pressure 130/78, pulse 60, temperature 97.9 F (36.6 C), temperature source Oral, height 5\' 11"  (1.803 m), weight 259 lb (117.5  kg), SpO2 97 %. Body mass index is 36.12 kg/m.  General: Cooperative, alert, well developed, in no acute distress. HEENT: Conjunctivae and lids unremarkable. Cardiovascular: Regular rhythm.  Lungs: Normal work of breathing. Neurologic: No focal deficits.    Lab Results  Component Value Date   CREATININE 1.00 12/16/2019   BUN 13 12/16/2019   NA 138 12/16/2019   K 4.0 12/16/2019   CL 100 12/16/2019   CO2 18 (L) 12/16/2019   Lab Results  Component Value Date   ALT 45 (H) 12/16/2019   AST 30 12/16/2019   ALKPHOS 79 12/16/2019   BILITOT 0.4 12/16/2019   Lab Results  Component Value Date   HGBA1C 6.6 (H) 12/16/2019   HGBA1C 6.4 12/08/2019   HGBA1C 6.3 (H) 07/24/2019   HGBA1C 6.3 10/14/2018   HGBA1C 6.1 01/10/2018   Lab Results  Component Value Date   INSULIN 34.4 (H) 12/16/2019   INSULIN 28.3 (H) 11/27/2016   Lab Results  Component Value Date   TSH 1.880 12/16/2019   Lab Results  Component Value Date   CHOL 231 (H) 12/16/2019   HDL 49 12/16/2019   LDLCALC 156 (H) 12/16/2019   LDLDIRECT 162.0 01/10/2018   TRIG 143 12/16/2019   CHOLHDL 4.7 12/16/2019   Lab Results  Component Value Date   WBC 5.4 12/16/2019   HGB 17.4 12/16/2019   HCT 51.7 (H) 12/16/2019   MCV 87 12/16/2019   PLT 233 12/16/2019   Lab Results  Component Value Date   FERRITIN 2,038 (H) 07/23/2019   Attestation Statements:   Reviewed by clinician on day of visit: allergies, medications, problem list, medical history, surgical history, family history, social history, and previous encounter notes.   I, Trixie Dredge, am acting as transcriptionist for Dennard Nip, MD.  I have reviewed the above documentation for accuracy and completeness, and I agree with the above. - Dennard Nip, MD

## 2020-01-16 ENCOUNTER — Other Ambulatory Visit: Payer: Self-pay

## 2020-01-16 ENCOUNTER — Ambulatory Visit (INDEPENDENT_AMBULATORY_CARE_PROVIDER_SITE_OTHER): Payer: Commercial Managed Care - PPO | Admitting: Family Medicine

## 2020-01-16 ENCOUNTER — Encounter: Payer: Self-pay | Admitting: Family Medicine

## 2020-01-16 ENCOUNTER — Other Ambulatory Visit: Payer: Self-pay | Admitting: Family Medicine

## 2020-01-16 DIAGNOSIS — M25512 Pain in left shoulder: Secondary | ICD-10-CM

## 2020-01-16 DIAGNOSIS — I10 Essential (primary) hypertension: Secondary | ICD-10-CM

## 2020-01-16 DIAGNOSIS — G8929 Other chronic pain: Secondary | ICD-10-CM | POA: Diagnosis not present

## 2020-01-16 DIAGNOSIS — M25511 Pain in right shoulder: Secondary | ICD-10-CM

## 2020-01-16 DIAGNOSIS — M545 Low back pain: Secondary | ICD-10-CM | POA: Diagnosis not present

## 2020-01-16 NOTE — Progress Notes (Signed)
Office Visit Note   Patient: [name] Foster           Date of Birth: 07/09/1966           MRN: [id number] Visit Date: 01/16/2020 Requested by: [name], MD [address] [place],  [place] [address] PCP: [name], MD  Subjective: No chief complaint on file.   HPI: He is here for follow-up left greater than right shoulder pain and low back pain.  Right shoulder MRI scan showed mild rotator cuff tendinopathy, moderate AC joint DJD and a superior labrum tear.  Left shoulder MRI scan showed mild rotator cuff tendinopathy, moderate AC joint arthritis.  Lumbar MRI scan was normal.  He has finished physical therapy.  He has had multiple subacromial injections.  He has had lumbar facet injections.  All of these things have helped but only temporarily.  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                ROS:   All other systems were reviewed and are negative.  Objective: Vital Signs: There were no vitals taken for this visit.  Physical Exam:  General:  Alert and oriented, in no acute distress. Pulm:  Breathing unlabored. Psy:  Normal mood, congruent affect.  Shoulders: Full range of motion, no adhesive capsulitis.  Rotator cuff strength is 5/5 throughout.   Imaging: No results found.  Assessment & Plan: 1.  Chronic left greater than right shoulder pain with rotator cuff tendinopathy and AC joint arthropathy, right shoulder degenerative labrum tear. -We discussed options and elected to refer him to Dr. Erlinda Hong for surgical consultation. -Out of work until July 1 while awaiting surgical decision.  2.  Chronic low back pain -He will continue efforts at weight loss.  He will continue treating vitamin D deficiency.  We will also try to be as active as possible around the house to build up his strength.     Procedures: No procedures performed  No notes on file     PMFS History: Patient Active Problem List   Diagnosis Date Noted  . Urinary  frequency 12/10/2019  . Dysuria 12/08/2019  . Acute respiratory failure with hypoxia ([place]) 07/24/2019  . Pneumonia due to COVID-19 virus 07/23/2019  . Bilateral inguinal hernia without obstruction or gangrene 04/11/2017  . Umbilical hernia without obstruction and without gangrene 04/11/2017  . Vitamin D deficiency 01/24/2017  . Type 2 diabetes mellitus without complication, without long-term current use of insulin ([place]) 01/24/2017  . Depression 01/24/2017  . Shortness of breath on exertion 11/27/2016  . Internal hemorrhoids   . Atypical chest pain   . Gastroesophageal reflux disease without esophagitis   . Preventative health care 10/10/2015  . Sleep apnea 10/10/2015  . Urinary hesitancy 09/30/2015  . Fatty liver 07/04/2015  . Edema 05/07/2014  . Scleroderma of esophagus ([place]) 03/20/2014  . Hiatal hernia with gastroesophageal reflux 03/20/2014  . Anxiety state 07/13/2013  . Erectile dysfunction 01/15/2013  . Back pain 03/09/2012  . Mesenteric panniculitis ([place]) 02/06/2012  . Heel pain, bilateral 04/25/2010  . Obesity 01/05/2010  . Hyperlipidemia 10/04/2009  . Essential hypertension 10/04/2009   Past Medical History:  Diagnosis Date  . Back pain   . Bone spur    left foot  . Chest pain   . Constipation   . COVID-19   . Diabetes mellitus type 2 in obese ([place]) 07/13/2013  . Diverticulosis   . Dyspnea   .  Erectile dysfunction 01/15/2013  . Esophageal reflux 01/15/2013  . Fatty liver 07/04/2015  . GERD (gastroesophageal reflux disease)   . Hiatal hernia   . Hiatal hernia with gastroesophageal reflux 03/20/2014  . Hyperglycemia 07/13/2013  . Hyperplastic colon polyp   . Hypertension   . Internal hemorrhoids   . Lower extremity edema   . Mixed hyperlipidemia   . Murmur   . OSA (obstructive sleep apnea)    s/p UPPP  . Plantar fasciitis   . Pneumonia due to COVID-19 virus   . Prediabetes   . Reflux esophagitis   . Shoulder pain   . Stomach ulcer    from PCP  . Urinary  hesitancy 09/30/2015  . Vitamin D deficiency     Family History  Problem Relation Age of Onset  . Dementia Mother   . Diabetes Mother   . Hypertension Mother   . Heart failure Mother   . Stroke Mother   . Obesity Mother   . Hyperlipidemia Mother   . Heart disease Mother   . Depression Mother   . Hypertension Other        siblings  . Diabetes Sister   . Cancer Brother        lung cancer smoker  . Heart attack Maternal Grandmother   . Diabetes Sister   . Pancreatic disease Sister   . Obesity Father   . Alcoholism Father   . Colon cancer Neg Hx     Past Surgical History:  Procedure Laterality Date  . [place] STUDY N/A 02/28/2016   Procedure: [place] STUDY;  Surgeon: Jerene Bears, MD;  Location: [facility];  Service: Gastroenterology;  Laterality: N/A;  . ESOPHAGEAL MANOMETRY N/A 09/01/2013   Procedure: ESOPHAGEAL MANOMETRY (EM);  Surgeon: Sable Feil, MD;  Location: [facility];  Service: Endoscopy;  Laterality: N/A;  . ESOPHAGEAL MANOMETRY N/A 02/28/2016   Procedure: ESOPHAGEAL MANOMETRY (EM);  Surgeon: Jerene Bears, MD;  Location: [facility];  Service: Gastroenterology;  Laterality: N/A;  . FOOT TENDON TRANSFER Right   . INGUINAL HERNIA REPAIR    . TONSILLECTOMY    . UMBILICAL HERNIA REPAIR    . UVULECTOMY     Social History   Occupational History  . Occupation: Nature conservation officer: [employer]  Tobacco Use  . Smoking status: Never Smoker  . Smokeless tobacco: Never Used  Substance and Sexual Activity  . Alcohol use: No  . Drug use: No  . Sexual activity: Yes    Partners: Female

## 2020-01-16 NOTE — Telephone Encounter (Signed)
Mychart message sent to pt for pharmacy clarification. Rx recently sent to [facility].

## 2020-01-21 ENCOUNTER — Institutional Professional Consult (permissible substitution): Payer: Commercial Managed Care - PPO | Admitting: Internal Medicine

## 2020-01-22 ENCOUNTER — Institutional Professional Consult (permissible substitution): Payer: Commercial Managed Care - PPO | Admitting: Internal Medicine

## 2020-01-23 ENCOUNTER — Ambulatory Visit (INDEPENDENT_AMBULATORY_CARE_PROVIDER_SITE_OTHER): Payer: Commercial Managed Care - PPO | Admitting: Orthopaedic Surgery

## 2020-01-23 ENCOUNTER — Other Ambulatory Visit: Payer: Self-pay

## 2020-01-23 ENCOUNTER — Ambulatory Visit: Payer: Self-pay

## 2020-01-23 DIAGNOSIS — M25512 Pain in left shoulder: Secondary | ICD-10-CM | POA: Diagnosis not present

## 2020-01-23 DIAGNOSIS — M25511 Pain in right shoulder: Secondary | ICD-10-CM | POA: Diagnosis not present

## 2020-01-23 NOTE — Progress Notes (Signed)
Subjective: Patient is here for ultrasound-guided intra-articular bilateral glenohumeral injection.  This is for diagnostic/therapeutic purposes to see whether surgical intervention might benefit him.  Objective: Pain especially reaching overhead and behind the back.  Procedure: Ultrasound-guided bilateral glenohumeral injection: After sterile prep with Betadine, injected 8 cc 1% lidocaine without epinephrine and 40 mg methylprednisolone using a 22-gauge spinal needle, passing the needle from posterior approach into the glenohumeral joint.  Injectate was seen filling both joint capsules.  He had very good relief during the immediate anesthetic phase.  He will follow-up in 3 weeks with Dr. Erlinda Hong.

## 2020-01-24 NOTE — Progress Notes (Signed)
Office Visit Note   Patient: [name] Foster           Date of Birth: November 30, 1965           MRN: [id number] Visit Date: 01/23/2020              Requested by: [name], MD [address] [place],  [place] [address] PCP: [name], MD   Assessment & Plan: Visit Diagnoses:  1. Left shoulder pain, unspecified chronicity   2. Right shoulder pain, unspecified chronicity     Plan: My impression is bilateral shoulder pain.  I get the sense that he may have some underlying chronic pain syndrome as well.  In terms of his MRI findings his right shoulder reveals a posterior superior labral tear with a small para labral cyst.  He has moderate AC arthrosis and mild superior rotator cuff tendinosis.  In terms of his left shoulder he has moderate AC arthrosis and mild degenerative changes of the labrum and rotator cuff.  Based on our findings today would like to try bilateral intra-articular shoulder injections to see how he responds to these.  I would like to recheck him in about 4weeks.  Questions encouraged and answered.  Follow-Up Instructions: Return in about 4 weeks (around 02/20/2020).   Orders:  Orders Placed This Encounter  Procedures  . US Guided Needle Placement - No Linked Charges   No orders of the defined types were placed in this encounter.     Procedures: No procedures performed   Clinical Data: No additional findings.   Subjective: Chief Complaint  Patient presents with  . Right Shoulder - Pain  . Left Shoulder - Pain    [name] Foster is a 54 year old gentleman referral from Dr. Junius Roads for evaluation of bilateral shoulder pain.  He states that the left shoulder feels worse.  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  The pain is worse with use and movement.  Denies any numbness and tingling or radicular symptoms.  He is right-handed.  He takes ibuprofen on a daily  basis.   Review of Systems  Constitutional: Negative.   All other systems reviewed and are negative.    Objective: Vital Signs: There were no vitals taken for this visit.  Physical Exam Vitals and nursing note reviewed.  Constitutional:      Appearance: He is well-developed.  HENT:     Head: Normocephalic and atraumatic.  Eyes:     Pupils: Pupils are equal, round, and reactive to light.  Pulmonary:     Effort: Pulmonary effort is normal.  Abdominal:     Palpations: Abdomen is soft.  Musculoskeletal:        General: Normal range of motion.     Cervical back: Neck supple.  Skin:    General: Skin is warm.  Neurological:     Mental Status: He is alert and oriented to person, place, and time.  Psychiatric:        Behavior: Behavior normal.        Thought Content: Thought content normal.        Judgment: Judgment normal.     Ortho Exam Right shoulder shows full range of motion with moderate pain.  Positive crank.  Manual muscle testing is normal.  He has tenderness to palpation throughout his shoulder.  No significant signs of impingement Left  shoulder shows full range of motion with moderate pain.  Manual muscle testing is normal.  Tenderness to palpation throughout his shoulder.  No significant signs of impingement Specialty Comments:  No specialty comments available.  Imaging: US Guided Needle Placement - No Linked Charges  Result Date: 01/23/2020 Ultrasound-guided bilateral glenohumeral injection: After sterile prep with Betadine, injected 8 cc 1% lidocaine without epinephrine and 40 mg methylprednisolone using a 22-gauge spinal needle, passing the needle from posterior approach into the glenohumeral joint.  Injectate was seen filling both joint capsules.  He had very good relief during the immediate anesthetic phase.     PMFS History: Patient Active Problem List   Diagnosis Date Noted  . Urinary frequency 12/10/2019  . Dysuria 12/08/2019  . Acute respiratory  failure with hypoxia ([place]) 07/24/2019  . Pneumonia due to COVID-19 virus 07/23/2019  . Bilateral inguinal hernia without obstruction or gangrene 04/11/2017  . Umbilical hernia without obstruction and without gangrene 04/11/2017  . Vitamin D deficiency 01/24/2017  . Type 2 diabetes mellitus without complication, without long-term current use of insulin ([place]) 01/24/2017  . Depression 01/24/2017  . Shortness of breath on exertion 11/27/2016  . Internal hemorrhoids   . Atypical chest pain   . Gastroesophageal reflux disease without esophagitis   . Preventative health care 10/10/2015  . Sleep apnea 10/10/2015  . Urinary hesitancy 09/30/2015  . Fatty liver 07/04/2015  . Edema 05/07/2014  . Scleroderma of esophagus ([place]) 03/20/2014  . Hiatal hernia with gastroesophageal reflux 03/20/2014  . Anxiety state 07/13/2013  . Erectile dysfunction 01/15/2013  . Back pain 03/09/2012  . Mesenteric panniculitis ([place]) 02/06/2012  . Heel pain, bilateral 04/25/2010  . Obesity 01/05/2010  . Hyperlipidemia 10/04/2009  . Essential hypertension 10/04/2009   Past Medical History:  Diagnosis Date  . Back pain   . Bone spur    left foot  . Chest pain   . Constipation   . COVID-19   . Diabetes mellitus type 2 in obese ([place]) 07/13/2013  . Diverticulosis   . Dyspnea   . Erectile dysfunction 01/15/2013  . Esophageal reflux 01/15/2013  . Fatty liver 07/04/2015  . GERD (gastroesophageal reflux disease)   . Hiatal hernia   . Hiatal hernia with gastroesophageal reflux 03/20/2014  . Hyperglycemia 07/13/2013  . Hyperplastic colon polyp   . Hypertension   . Internal hemorrhoids   . Lower extremity edema   . Mixed hyperlipidemia   . Murmur   . OSA (obstructive sleep apnea)    s/p UPPP  . Plantar fasciitis   . Pneumonia due to COVID-19 virus   . Prediabetes   . Reflux esophagitis   . Shoulder pain   . Stomach ulcer    from PCP  . Urinary hesitancy 09/30/2015  . Vitamin D deficiency     Family History   Problem Relation Age of Onset  . Dementia Mother   . Diabetes Mother   . Hypertension Mother   . Heart failure Mother   . Stroke Mother   . Obesity Mother   . Hyperlipidemia Mother   . Heart disease Mother   . Depression Mother   . Hypertension Other        siblings  . Diabetes Sister   . Cancer Brother        lung cancer smoker  . Heart attack Maternal Grandmother   . Diabetes Sister   . Pancreatic disease Sister   . Obesity Father   . Alcoholism Father   . Colon cancer  Neg Hx     Past Surgical History:  Procedure Laterality Date  . [place] STUDY N/A 02/28/2016   Procedure: [place] STUDY;  Surgeon: Jerene Bears, MD;  [facility];  Service: Gastroenterology;  Laterality: N/A;  . ESOPHAGEAL MANOMETRY N/A 09/01/2013   Procedure: ESOPHAGEAL MANOMETRY (EM);  Surgeon: Sable Feil, MD;  [facility];  Service: Endoscopy;  Laterality: N/A;  . ESOPHAGEAL MANOMETRY N/A 02/28/2016   Procedure: ESOPHAGEAL MANOMETRY (EM);  Surgeon: Jerene Bears, MD;  [facility];  Service: Gastroenterology;  Laterality: N/A;  . FOOT TENDON TRANSFER Right   . INGUINAL HERNIA REPAIR    . TONSILLECTOMY    . UMBILICAL HERNIA REPAIR    . UVULECTOMY     Social History   Occupational History  . Occupation: Nature conservation officer: [employer]  Tobacco Use  . Smoking status: Never Smoker  . Smokeless tobacco: Never Used  Substance and Sexual Activity  . Alcohol use: No  . Drug use: No  . Sexual activity: Yes    Partners: Female

## 2020-02-02 ENCOUNTER — Other Ambulatory Visit: Payer: Self-pay

## 2020-02-02 ENCOUNTER — Ambulatory Visit (INDEPENDENT_AMBULATORY_CARE_PROVIDER_SITE_OTHER): Payer: Commercial Managed Care - PPO | Admitting: Family Medicine

## 2020-02-02 ENCOUNTER — Encounter (INDEPENDENT_AMBULATORY_CARE_PROVIDER_SITE_OTHER): Payer: Self-pay | Admitting: Family Medicine

## 2020-02-02 VITALS — BP 147/83 | HR 62 | Temp 98.2°F | Ht 71.0 in | Wt 251.0 lb

## 2020-02-02 DIAGNOSIS — Z9189 Other specified personal risk factors, not elsewhere classified: Secondary | ICD-10-CM

## 2020-02-02 DIAGNOSIS — E559 Vitamin D deficiency, unspecified: Secondary | ICD-10-CM | POA: Diagnosis not present

## 2020-02-02 DIAGNOSIS — E1169 Type 2 diabetes mellitus with other specified complication: Secondary | ICD-10-CM

## 2020-02-02 DIAGNOSIS — I10 Essential (primary) hypertension: Secondary | ICD-10-CM | POA: Diagnosis not present

## 2020-02-02 DIAGNOSIS — Z6835 Body mass index (BMI) 35.0-35.9, adult: Secondary | ICD-10-CM

## 2020-02-02 MED ORDER — VITAMIN D (ERGOCALCIFEROL) 1.25 MG (50000 UNIT) PO CAPS: ORAL_CAPSULE | ORAL | 0 refills | Status: DC

## 2020-02-03 NOTE — Progress Notes (Signed)
Chief Complaint:   OBESITY [name] Foster is here to discuss his progress with his obesity treatment plan along with follow-up of his obesity related diagnoses. [name] Foster is on the Category 3 Plan with breakfast options and states he is following his eating plan approximately 85-90% of the time. [name] Foster states he is active while doing yard work.  Today's visit was #: 4 Starting weight: 266 lbs Starting date: 12/16/2019 Today's weight: 251 lbs Today's date: 02/02/2020 Total lbs lost to date: 15 Total lbs lost since last in-office visit: 8  Interim History: [name] Foster continues to do well with weight loss. He is happy with his eating plan and he states his hunger is controlled. He is doing more yard work for exercise. He is happy with his weight loss and he wants to continue as is.  Subjective:   1. Essential hypertension Kayla's blood pressure is elevated today, normally well controlled. He is doing well with diet and weight loss. He denies chest pain.  2. Type 2 diabetes mellitus with other specified complication, without long-term current use of insulin ([name] Foster) [name] Foster is tolerating metformin well and he is doing great on his eating plan. He has started to check his BGs and they tend to run in the 90's. He denies hypoglycemia.  3. Vitamin D deficiency [name] Foster is stable on Vit D, but his level is not yet at goal.  4. At risk for heart disease [name] Foster is at a higher than average risk for cardiovascular disease due to obesity and elevated blood pressure.   Assessment/Plan:   1. Essential hypertension [name] Foster will continue his medications, diet, healthy weight loss and exercise to improve blood pressure control. We will watch for signs of hypotension as he continues his lifestyle modifications. We will recheck labs in 2-3 months.  2. Type 2 diabetes mellitus with other specified complication, without long-term current use of insulin ([name]) Good blood sugar control is important to decrease the  likelihood of diabetic complications such as nephropathy, neuropathy, limb loss, blindness, coronary artery disease, and death. Intensive lifestyle modification including diet, exercise and weight loss are the first line of treatment for diabetes. [name] Foster will continue taking metformin, and will continue with diet. We will recheck labs in 1-2 months.  3. Vitamin D deficiency Low Vitamin D level contributes to fatigue and are associated with obesity, breast, and colon cancer. We will refill prescription Vitamin D for 1 month. [name] Foster will follow-up for routine testing of Vitamin D, at least 2-3 times per year to avoid over-replacement.  - Vitamin D, Ergocalciferol, (DRISDOL) 1.25 MG (50000 UNIT) CAPS capsule; TAKE 1 CAPSULE BY MOUTH ONCE A WEEK  Dispense: 4 capsule; Refill: 0  4. At risk for heart disease [name] Foster was given approximately 15 minutes of coronary artery disease prevention counseling today. He is 54 y.o. male and has risk factors for heart disease including obesity and elevated blood pressure. We discussed intensive lifestyle modifications today with an emphasis on specific weight loss instructions and strategies.   Repetitive spaced learning was employed today to elicit superior memory formation and behavioral change.  5. Class 2 severe obesity with serious comorbidity and body mass index (BMI) of 35.0 to 35.9 in adult, unspecified obesity type ([name] Foster) [name] Foster is currently in the action stage of change. As such, his goal is to continue with weight loss efforts. He has agreed to the Category 3 Plan.   Exercise goals: As is.  Behavioral modification strategies: meal planning and cooking strategies and better snacking choices.  [name] Foster has agreed to follow-up with our clinic in 2 to 3 weeks. He was informed of the importance of frequent follow-up visits to maximize his success with intensive lifestyle modifications for his multiple health conditions.   Objective:   Blood pressure (!) 147/83,  pulse 62, temperature 98.2 F (36.8 C), temperature source Oral, height 5\' 11"  (1.803 m), weight 251 lb (113.9 kg), SpO2 98 %. Body mass index is 35.01 kg/m.  General: Cooperative, alert, well developed, in no acute distress. HEENT: Conjunctivae and lids unremarkable. Cardiovascular: Regular rhythm.  Lungs: Normal work of breathing. Neurologic: No focal deficits.   Lab Results  Component Value Date   CREATININE 1.00 12/16/2019   BUN 13 12/16/2019   NA 138 12/16/2019   K 4.0 12/16/2019   CL 100 12/16/2019   CO2 18 (L) 12/16/2019   Lab Results  Component Value Date   ALT 45 (H) 12/16/2019   AST 30 12/16/2019   ALKPHOS 79 12/16/2019   BILITOT 0.4 12/16/2019   Lab Results  Component Value Date   HGBA1C 6.6 (H) 12/16/2019   HGBA1C 6.4 12/08/2019   HGBA1C 6.3 (H) 07/24/2019   HGBA1C 6.3 10/14/2018   HGBA1C 6.1 01/10/2018   Lab Results  Component Value Date   INSULIN 34.4 (H) 12/16/2019   INSULIN 28.3 (H) 11/27/2016   Lab Results  Component Value Date   TSH 1.880 12/16/2019   Lab Results  Component Value Date   CHOL 231 (H) 12/16/2019   HDL 49 12/16/2019   LDLCALC 156 (H) 12/16/2019   LDLDIRECT 162.0 01/10/2018   TRIG 143 12/16/2019   CHOLHDL 4.7 12/16/2019   Lab Results  Component Value Date   WBC 5.4 12/16/2019   HGB 17.4 12/16/2019   HCT 51.7 (H) 12/16/2019   MCV 87 12/16/2019   PLT 233 12/16/2019   Lab Results  Component Value Date   FERRITIN 2,038 (H) 07/23/2019   Attestation Statements:   Reviewed by clinician on day of visit: allergies, medications, problem list, medical history, surgical history, family history, social history, and previous encounter notes.   I, Trixie Dredge, am acting as transcriptionist for Dennard Nip, MD.  I have reviewed the above documentation for accuracy and completeness, and I agree with the above. -

## 2020-02-16 ENCOUNTER — Telehealth: Payer: Self-pay | Admitting: Family Medicine

## 2020-02-16 DIAGNOSIS — I10 Essential (primary) hypertension: Secondary | ICD-10-CM

## 2020-02-16 MED ORDER — AMLODIPINE BESYLATE 10 MG PO TABS: 10.0000 mg | ORAL_TABLET | Freq: Every day | ORAL | 1 refills | Status: DC

## 2020-02-16 MED ORDER — ROSUVASTATIN CALCIUM 20 MG PO TABS: 20.0000 mg | ORAL_TABLET | Freq: Every day | ORAL | 1 refills | Status: DC

## 2020-02-16 MED ORDER — OMEPRAZOLE 40 MG PO CPDR: DELAYED_RELEASE_CAPSULE | ORAL | 1 refills | Status: DC

## 2020-02-16 MED ORDER — CARVEDILOL 12.5 MG PO TABS: 12.5000 mg | ORAL_TABLET | Freq: Two times a day (BID) | ORAL | 1 refills | Status: DC

## 2020-02-16 MED ORDER — TRIAMTERENE-HCTZ 37.5-25 MG PO TABS: 1.0000 | ORAL_TABLET | Freq: Every day | ORAL | 1 refills | Status: DC

## 2020-02-16 NOTE — Telephone Encounter (Signed)
Patient states he would like for all Future meds and refill to [facility]   He also states that in April he only got a 30 day supply of omeprazole (PRILOSEC) 40 MG capsule. But should be 90 day.  [id number] [place], [address] Phone:  [phone]  Fax:  [phone]

## 2020-02-16 NOTE — Telephone Encounter (Signed)
Called and got list of medications from patient that he needed.  [facility]

## 2020-02-17 DIAGNOSIS — M79676 Pain in unspecified toe(s): Secondary | ICD-10-CM

## 2020-02-20 ENCOUNTER — Ambulatory Visit (INDEPENDENT_AMBULATORY_CARE_PROVIDER_SITE_OTHER): Payer: Commercial Managed Care - PPO | Admitting: Orthopaedic Surgery

## 2020-02-20 ENCOUNTER — Other Ambulatory Visit: Payer: Self-pay

## 2020-02-20 DIAGNOSIS — M25512 Pain in left shoulder: Secondary | ICD-10-CM | POA: Diagnosis not present

## 2020-02-20 DIAGNOSIS — G8929 Other chronic pain: Secondary | ICD-10-CM

## 2020-02-20 DIAGNOSIS — M542 Cervicalgia: Secondary | ICD-10-CM

## 2020-02-20 NOTE — Progress Notes (Signed)
Office Visit Note   Patient: [name] Foster           Date of Birth: [birth year]           MRN: [id number] Visit Date: 02/20/2020              Requested by: [name], MD [address] [place],  [place] [address] PCP: [name], MD   Assessment & Plan: Visit Diagnoses:  1. Chronic left shoulder pain     Plan: I reviewed the MRI of the left shoulder once again and he has minimal findings that would explain his chronic pain.  At this point we will need to obtain a C-spine MRI to rule out structural abnormalities.  Follow-up after the C-spine MRI.  Follow-Up Instructions: Return if symptoms worsen or fail to improve.   Orders:  No orders of the defined types were placed in this encounter.  No orders of the defined types were placed in this encounter.     Procedures: No procedures performed   Clinical Data: No additional findings.   Subjective: Chief Complaint  Patient presents with  . Left Shoulder - Follow-up  . Right Shoulder - Follow-up    Macoy returns today for bilateral shoulder pain.  He is status post bilateral shoulder injections by Dr. Junius Roads on 5/14 and he states that he has had really good relief from the right injection but the left shoulder continues to be symptomatic.   Review of Systems   Objective: Vital Signs: There were no vitals taken for this visit.  Physical Exam  Ortho Exam Left shoulder shows mild restriction in range of motion.  Manual muscle testing is normal. Specialty Comments:  No specialty comments available.  Imaging: No results found.   PMFS History: Patient Active Problem List   Diagnosis Date Noted  . Urinary frequency 12/10/2019  . Dysuria 12/08/2019  . Acute respiratory failure with hypoxia ([place]) 07/24/2019  . Pneumonia due to COVID-19 virus 07/23/2019  . Bilateral inguinal hernia without obstruction or gangrene 04/11/2017  . Umbilical hernia without obstruction and without gangrene  04/11/2017  . Vitamin D deficiency 01/24/2017  . Type 2 diabetes mellitus without complication, without long-term current use of insulin ([place]) 01/24/2017  . Depression 01/24/2017  . Shortness of breath on exertion 11/27/2016  . Internal hemorrhoids   . Atypical chest pain   . Gastroesophageal reflux disease without esophagitis   . Preventative health care 10/10/2015  . Sleep apnea 10/10/2015  . Urinary hesitancy 09/30/2015  . Fatty liver 07/04/2015  . Edema 05/07/2014  . Scleroderma of esophagus ([place]) 03/20/2014  . Hiatal hernia with gastroesophageal reflux 03/20/2014  . Anxiety state 07/13/2013  . Erectile dysfunction 01/15/2013  . Back pain 03/09/2012  . Mesenteric panniculitis ([place]) 02/06/2012  . Heel pain, bilateral 04/25/2010  . Obesity 01/05/2010  . Hyperlipidemia 10/04/2009  . Essential hypertension 10/04/2009   Past Medical History:  Diagnosis Date  . Back pain   . Bone spur    left foot  . Chest pain   . Constipation   . COVID-19   . Diabetes mellitus type 2 in obese ([place]) 07/13/2013  . Diverticulosis   . Dyspnea   . Erectile dysfunction 01/15/2013  . Esophageal reflux 01/15/2013  . Fatty liver 07/04/2015  . GERD (gastroesophageal reflux disease)   . Hiatal hernia   . Hiatal hernia with gastroesophageal reflux 03/20/2014  . Hyperglycemia 07/13/2013  . Hyperplastic colon polyp   . Hypertension   .  Internal hemorrhoids   . Lower extremity edema   . Mixed hyperlipidemia   . Murmur   . OSA (obstructive sleep apnea)    s/p UPPP  . Plantar fasciitis   . Pneumonia due to COVID-19 virus   . Prediabetes   . Reflux esophagitis   . Shoulder pain   . Stomach ulcer    from PCP  . Urinary hesitancy 09/30/2015  . Vitamin D deficiency     Family History  Problem Relation Age of Onset  . Dementia Mother   . Diabetes Mother   . Hypertension Mother   . Heart failure Mother   . Stroke Mother   . Obesity Mother   . Hyperlipidemia Mother   . Heart disease Mother   .  Depression Mother   . Hypertension Other        siblings  . Diabetes Sister   . Cancer Brother        lung cancer smoker  . Heart attack Maternal Grandmother   . Diabetes Sister   . Pancreatic disease Sister   . Obesity Father   . Alcoholism Father   . Colon cancer Neg Hx     Past Surgical History:  Procedure Laterality Date  . [place] STUDY N/A 02/28/2016   Procedure: [place] STUDY;  Surgeon: Jerene Bears, MD;  Location: [facility];  Service: Gastroenterology;  Laterality: N/A;  . ESOPHAGEAL MANOMETRY N/A 09/01/2013   Procedure: ESOPHAGEAL MANOMETRY (EM);  Surgeon: Sable Feil, MD;  Location: [facility];  Service: Endoscopy;  Laterality: N/A;  . ESOPHAGEAL MANOMETRY N/A 02/28/2016   Procedure: ESOPHAGEAL MANOMETRY (EM);  Surgeon: Jerene Bears, MD;  Location: [facility];  Service: Gastroenterology;  Laterality: N/A;  . FOOT TENDON TRANSFER Right   . INGUINAL HERNIA REPAIR    . TONSILLECTOMY    . UMBILICAL HERNIA REPAIR    . UVULECTOMY     Social History   Occupational History  . Occupation: Nature conservation officer: [employer]  Tobacco Use  . Smoking status: Never Smoker  . Smokeless tobacco: Never Used  Vaping Use  . Vaping Use: Never used  Substance and Sexual Activity  . Alcohol use: No  . Drug use: No  . Sexual activity: Yes    Partners: Female

## 2020-02-23 ENCOUNTER — Encounter (INDEPENDENT_AMBULATORY_CARE_PROVIDER_SITE_OTHER): Payer: Self-pay | Admitting: Family Medicine

## 2020-02-23 ENCOUNTER — Other Ambulatory Visit: Payer: Self-pay

## 2020-02-23 ENCOUNTER — Ambulatory Visit (INDEPENDENT_AMBULATORY_CARE_PROVIDER_SITE_OTHER): Payer: Commercial Managed Care - PPO | Admitting: Family Medicine

## 2020-02-23 VITALS — BP 123/81 | HR 59 | Temp 98.0°F | Ht 71.0 in | Wt 254.0 lb

## 2020-02-23 DIAGNOSIS — E1169 Type 2 diabetes mellitus with other specified complication: Secondary | ICD-10-CM | POA: Diagnosis not present

## 2020-02-23 DIAGNOSIS — Z9189 Other specified personal risk factors, not elsewhere classified: Secondary | ICD-10-CM | POA: Diagnosis not present

## 2020-02-23 DIAGNOSIS — Z6835 Body mass index (BMI) 35.0-35.9, adult: Secondary | ICD-10-CM

## 2020-02-23 DIAGNOSIS — E559 Vitamin D deficiency, unspecified: Secondary | ICD-10-CM

## 2020-02-23 DIAGNOSIS — I10 Essential (primary) hypertension: Secondary | ICD-10-CM

## 2020-02-23 MED ORDER — VITAMIN D (ERGOCALCIFEROL) 1.25 MG (50000 UNIT) PO CAPS: ORAL_CAPSULE | ORAL | 0 refills | Status: DC

## 2020-02-23 MED ORDER — METFORMIN HCL 500 MG PO TABS: 500.0000 mg | ORAL_TABLET | Freq: Two times a day (BID) | ORAL | 0 refills | Status: DC

## 2020-02-25 NOTE — Progress Notes (Signed)
Chief Complaint:   OBESITY [name] Foster is here to discuss his progress with his obesity treatment plan along with follow-up of his obesity related diagnoses. [name] Foster is on the Category 3 Plan and states he is following his eating plan approximately 75% of the time. [name] Foster states he is on the treadmill and stair elliptical for 20 minutes 3 times per week.  Today's visit was #: 5 Starting weight: 266 lbs Starting date: 12/16/2019 Today's weight: 254 lbs Today's date: 02/23/2020 Total lbs lost to date: 12 Total lbs lost since last in-office visit: 0  Interim History: [name] Foster did some celebration eating multiple days the last 3 weeks. He denies hunger issues or cravings. He started at the gym recently 3 days per week, doing 15 minutes on the bike, cardio for 30-40 minutes on the treadmill, stair master, and elliptical.  Subjective:   1. Type 2 diabetes mellitus with other specified complication, without long-term current use of insulin ([name]) [name] Foster fasting BGs range between 94 and 106, and he denies lows or highs. He denies symptoms and is tolerating metformin well. Last A1c was 6.6 on 12/16/2019, less than 3 months ago.  2. Vitamin D deficiency [name] Foster last Vit D level was 21.7. He is tolerating his medications well and denies side effects.  3. Essential hypertension [name] Foster blood pressure was 145/83 at his last office visit. His blood pressure today is much better controlled at 123/81. He denies headaches or dizziness.  4. At risk for hypoglycemia [name] Foster is at increased risk for hypoglycemia due to changes in diet, diagnosis of diabetes, and/or insulin use.   Assessment/Plan:   1. Type 2 diabetes mellitus with other specified complication, without long-term current use of insulin ([name]) [name] blood sugar Foster is important to decrease the likelihood of diabetic complications such as nephropathy, neuropathy, limb loss, blindness, coronary artery disease, and death. Intensive lifestyle  modification including diet, exercise and weight loss are the first line of treatment for diabetes. We will refill metformin for 90 days with no refills. We will recheck labs at his next office visit.  - metFORMIN (GLUCOPHAGE) 500 MG tablet; Take 1 tablet (500 mg total) by mouth 2 (two) times daily with a meal.  Dispense: 180 tablet; Refill: 0  2. Vitamin D deficiency Low Vitamin D level contributes to fatigue and are associated with obesity, breast, and colon cancer. We will refill prescription Vitamin D for 1 month. [name] Foster will follow-up for routine testing of Vitamin D, at least 2-3 times per year to avoid over-replacement.  - Vitamin D, Ergocalciferol, (DRISDOL) 1.25 MG (50000 UNIT) CAPS capsule; TAKE 1 CAPSULE BY MOUTH ONCE A WEEK  Dispense: 4 capsule; Refill: 0  3. Essential hypertension [name] Foster is working on healthy weight loss and exercise to improve blood pressure Foster. We will watch for signs of hypotension as he continues his lifestyle modifications. [name] Foster will continue his medications per his primary care physician listed under [name] Foster. We will recheck labs at his next office visit.  4. At risk for hypoglycemia [name] Foster was given approximately 15 minutes of counseling today regarding prevention of hypoglycemia. He was advised of symptoms of hypoglycemia. [name] Foster was instructed to avoid skipping meals, eat regular protein rich meals and schedule low calorie snacks as needed.   Repetitive spaced learning was employed today to elicit superior memory formation and behavioral change  5. Class 2 severe obesity with serious comorbidity and body mass index (BMI) of 35.0 to 35.9 in adult, unspecified obesity type ([name] Foster) [name] Foster is  currently in the action stage of change. As such, his goal is to continue with weight loss efforts. He has agreed to the Category 3 Plan.   Exercise goals: As is.  Behavioral modification strategies: travel eating strategies, celebration eating strategies and  planning for success.  [name] Foster has agreed to follow-up with our clinic in 3 weeks. He was informed of the importance of frequent follow-up visits to maximize his success with intensive lifestyle modifications for his multiple health conditions.   Objective:   Blood pressure 123/81, pulse (!) 59, temperature 98 F (36.7 C), temperature source Oral, height 5\' 11"  (1.803 m), weight 254 lb (115.2 kg), SpO2 96 %. Body mass index is 35.43 kg/m.  General: Cooperative, alert, well developed, in no acute distress. HEENT: Conjunctivae and lids unremarkable. Cardiovascular: Regular rhythm.  Lungs: Normal work of breathing. Neurologic: No focal deficits.   Lab Results  Component Value Date   CREATININE 1.00 12/16/2019   BUN 13 12/16/2019   NA 138 12/16/2019   K 4.0 12/16/2019   CL 100 12/16/2019   CO2 18 (L) 12/16/2019   Lab Results  Component Value Date   ALT 45 (H) 12/16/2019   AST 30 12/16/2019   ALKPHOS 79 12/16/2019   BILITOT 0.4 12/16/2019   Lab Results  Component Value Date   HGBA1C 6.6 (H) 12/16/2019   HGBA1C 6.4 12/08/2019   HGBA1C 6.3 (H) 07/24/2019   HGBA1C 6.3 10/14/2018   HGBA1C 6.1 01/10/2018   Lab Results  Component Value Date   INSULIN 34.4 (H) 12/16/2019   INSULIN 28.3 (H) 11/27/2016   Lab Results  Component Value Date   TSH 1.880 12/16/2019   Lab Results  Component Value Date   CHOL 231 (H) 12/16/2019   HDL 49 12/16/2019   LDLCALC 156 (H) 12/16/2019   LDLDIRECT 162.0 01/10/2018   TRIG 143 12/16/2019   CHOLHDL 4.7 12/16/2019   Lab Results  Component Value Date   WBC 5.4 12/16/2019   HGB 17.4 12/16/2019   HCT 51.7 (H) 12/16/2019   MCV 87 12/16/2019   PLT 233 12/16/2019   Lab Results  Component Value Date   FERRITIN 2,038 (H) 07/23/2019   Attestation Statements:   Reviewed by clinician on day of visit: allergies, medications, problem list, medical history, surgical history, family history, social history, and previous encounter notes.   Wilhemena Durie, am acting as transcriptionist for [employer], DO.  I have reviewed the above documentation for accuracy and completeness, and I agree with the above. Mellody Dance, DO

## 2020-03-01 ENCOUNTER — Institutional Professional Consult (permissible substitution): Payer: Commercial Managed Care - PPO | Admitting: Internal Medicine

## 2020-03-03 ENCOUNTER — Telehealth: Payer: Self-pay | Admitting: Family Medicine

## 2020-03-03 ENCOUNTER — Telehealth: Payer: Self-pay | Admitting: Orthopaedic Surgery

## 2020-03-03 NOTE — Telephone Encounter (Signed)
Ok to extend note 

## 2020-03-03 NOTE — Telephone Encounter (Signed)
Please advise 

## 2020-03-03 NOTE — Telephone Encounter (Signed)
Pt stated he wasn't sure who extended his note last time but he would like his out of work note extended until after 03/25/20 due to his MRI being on 03/25/20.  [phone]

## 2020-03-03 NOTE — Telephone Encounter (Signed)
error 

## 2020-03-04 NOTE — Telephone Encounter (Signed)
Left a voice mail for patient to call or message through [place] what date would be best: the 15th is on a Thursday - do we extend through Friday or Sunday?

## 2020-03-09 ENCOUNTER — Telehealth: Payer: Self-pay | Admitting: Family Medicine

## 2020-03-09 ENCOUNTER — Ambulatory Visit: Payer: Commercial Managed Care - PPO | Admitting: Family Medicine

## 2020-03-09 NOTE — Telephone Encounter (Signed)
The patient has called back and clarified the dates for which he is asking, on a new message. I forwarded that one to Dr. Junius Roads again, since the patient is asking to be out until 04/25/20 (instead of 7/01).   See that message from 03/09/20.

## 2020-03-09 NOTE — Telephone Encounter (Signed)
Patient called requesting an updated doctor's note. Patient has a upcoming MRI appt in July 15th. Patient is asking to be out of work until 04/11/20. Please call patient concerning this matter. Patient phone number is [phone].

## 2020-03-09 NOTE — Telephone Encounter (Signed)
OK to extend the work note this far out?

## 2020-03-09 NOTE — Telephone Encounter (Signed)
That's ok.

## 2020-03-10 NOTE — Telephone Encounter (Signed)
Letter written and faxed to the attention of Sabrina at [phone], per patient request. The letter should be visible to the patient in [place].

## 2020-03-17 ENCOUNTER — Ambulatory Visit (INDEPENDENT_AMBULATORY_CARE_PROVIDER_SITE_OTHER): Payer: Commercial Managed Care - PPO | Admitting: Family Medicine

## 2020-03-17 ENCOUNTER — Encounter (INDEPENDENT_AMBULATORY_CARE_PROVIDER_SITE_OTHER): Payer: Self-pay | Admitting: Family Medicine

## 2020-03-17 ENCOUNTER — Other Ambulatory Visit: Payer: Self-pay

## 2020-03-17 VITALS — BP 117/76 | HR 69 | Temp 98.6°F | Ht 71.0 in | Wt 250.0 lb

## 2020-03-17 DIAGNOSIS — Z6835 Body mass index (BMI) 35.0-35.9, adult: Secondary | ICD-10-CM

## 2020-03-17 DIAGNOSIS — F3289 Other specified depressive episodes: Secondary | ICD-10-CM | POA: Diagnosis not present

## 2020-03-17 DIAGNOSIS — G4709 Other insomnia: Secondary | ICD-10-CM | POA: Diagnosis not present

## 2020-03-17 DIAGNOSIS — E66812 Obesity, class 2: Secondary | ICD-10-CM

## 2020-03-17 DIAGNOSIS — Z9189 Other specified personal risk factors, not elsewhere classified: Secondary | ICD-10-CM | POA: Diagnosis not present

## 2020-03-17 MED ORDER — BUPROPION HCL ER (SR) 150 MG PO TB12: 150.0000 mg | ORAL_TABLET | Freq: Every day | ORAL | 0 refills | Status: DC

## 2020-03-22 NOTE — Progress Notes (Signed)
Chief Complaint:   OBESITY [name] Foster is here to discuss his progress with his obesity treatment plan along with follow-up of his obesity related diagnoses. [name] Foster is on the Category 3 Plan and states he is following his eating plan approximately 75% of the time. [name] Foster states he is on the treadmill for 40 minutes 2 times per week.  Today's visit was #: 6 Starting weight: 266 lbs Starting date: 12/16/2019 Today's weight: 250 lbs Today's date: 03/17/2020 Total lbs lost to date: 16 Total lbs lost since last in-office visit: 4  Interim History: [name] Foster continues to do very well with weight loss on his Category 3 plan. He notes some emotional eating at times however.  Subjective:   1. Other insomnia [name] Foster sleep approximately 5 hours, and he struggles to fall asleep and staying asleep.  2. Other depression with emotional eating [name] Foster notes some emotional eating, and he had done well with bupropion in the past.  3. At risk for heart disease [name] Foster is at a higher than average risk for cardiovascular disease due to obesity.   Assessment/Plan:   1. Other insomnia The problem of recurrent insomnia was discussed. Orders and follow up as documented in patient record. Counseling: Intensive lifestyle modifications are the first line treatment for this issue. We discussed several lifestyle modifications today. [name] Foster agreed to start melatonin 5 mg qhs OTC, and sleep strategies were discussed. He will continue to work on diet, exercise and weight loss efforts.   2. Other depression with emotional eating Behavior modification techniques were discussed today to help [name] Foster deal with his emotional/non-hunger eating behaviors. [name] Foster agreed to start Wellbutrin SR 150 mg q AM with no refills. Orders and follow up as documented in patient record.   - buPROPion (WELLBUTRIN SR) 150 MG 12 hr tablet; Take 1 tablet (150 mg total) by mouth daily.  Dispense: 30 tablet; Refill: 0  3. At risk for heart  disease [name] Foster was given approximately 15 minutes of coronary artery disease prevention counseling today. He is 54 y.o. male and has risk factors for heart disease including obesity. We discussed intensive lifestyle modifications today with an emphasis on specific weight loss instructions and strategies.   Repetitive spaced learning was employed today to elicit superior memory formation and behavioral change.  4. Class 2 severe obesity with serious comorbidity and body mass index (BMI) of 35.0 to 35.9 in adult, unspecified obesity type ([name] Foster) [name] Foster is currently in the action stage of change. As such, his goal is to continue with weight loss efforts. He has agreed to the Category 3 Plan.   Exercise goals: As is.  Behavioral modification strategies: emotional eating strategies.  Smith has agreed to follow-up with our clinic in 2 to 3 weeks. He was informed of the importance of frequent follow-up visits to maximize his success with intensive lifestyle modifications for his multiple health conditions.   Objective:   Blood pressure 117/76, pulse 69, temperature 98.6 F (37 C), temperature source Oral, height 5\' 11"  (1.803 m), weight 250 lb (113.4 kg), SpO2 97 %. Body mass index is 34.87 kg/m.  General: Cooperative, alert, well developed, in no acute distress. HEENT: Conjunctivae and lids unremarkable. Cardiovascular: Regular rhythm.  Lungs: Normal work of breathing. Neurologic: No focal deficits.   Lab Results  Component Value Date   CREATININE 1.00 12/16/2019   BUN 13 12/16/2019   NA 138 12/16/2019   K 4.0 12/16/2019   CL 100 12/16/2019   CO2 18 (L) 12/16/2019   Lab  Results  Component Value Date   ALT 45 (H) 12/16/2019   AST 30 12/16/2019   ALKPHOS 79 12/16/2019   BILITOT 0.4 12/16/2019   Lab Results  Component Value Date   HGBA1C 6.6 (H) 12/16/2019   HGBA1C 6.4 12/08/2019   HGBA1C 6.3 (H) 07/24/2019   HGBA1C 6.3 10/14/2018   HGBA1C 6.1 01/10/2018   Lab Results   Component Value Date   INSULIN 34.4 (H) 12/16/2019   INSULIN 28.3 (H) 11/27/2016   Lab Results  Component Value Date   TSH 1.880 12/16/2019   Lab Results  Component Value Date   CHOL 231 (H) 12/16/2019   HDL 49 12/16/2019   LDLCALC 156 (H) 12/16/2019   LDLDIRECT 162.0 01/10/2018   TRIG 143 12/16/2019   CHOLHDL 4.7 12/16/2019   Lab Results  Component Value Date   WBC 5.4 12/16/2019   HGB 17.4 12/16/2019   HCT 51.7 (H) 12/16/2019   MCV 87 12/16/2019   PLT 233 12/16/2019   Lab Results  Component Value Date   FERRITIN 2,038 (H) 07/23/2019   Attestation Statements:   Reviewed by clinician on day of visit: allergies, medications, problem list, medical history, surgical history, family history, social history, and previous encounter notes.   I, [name] Foster, am acting as transcriptionist for [name] Nip, MD.  I have reviewed the above documentation for accuracy and completeness, and I agree with the above. -  [name] Nip, MD

## 2020-03-24 ENCOUNTER — Ambulatory Visit: Payer: Commercial Managed Care - PPO | Admitting: Family Medicine

## 2020-03-25 ENCOUNTER — Ambulatory Visit
Admission: RE | Admit: 2020-03-25 | Discharge: 2020-03-25 | Disposition: A | Discharge status: Home / Self Care | Payer: Commercial Managed Care - PPO | Source: Ambulatory Visit | Attending: Orthopaedic Surgery | Admitting: Orthopaedic Surgery

## 2020-03-25 ENCOUNTER — Other Ambulatory Visit: Payer: Self-pay

## 2020-03-25 DIAGNOSIS — M542 Cervicalgia: Secondary | ICD-10-CM

## 2020-03-29 ENCOUNTER — Ambulatory Visit: Payer: Commercial Managed Care - PPO | Admitting: Family Medicine

## 2020-03-31 ENCOUNTER — Other Ambulatory Visit (INDEPENDENT_AMBULATORY_CARE_PROVIDER_SITE_OTHER): Payer: Self-pay | Admitting: Family Medicine

## 2020-03-31 ENCOUNTER — Ambulatory Visit (INDEPENDENT_AMBULATORY_CARE_PROVIDER_SITE_OTHER): Payer: Commercial Managed Care - PPO | Admitting: Orthopaedic Surgery

## 2020-03-31 ENCOUNTER — Other Ambulatory Visit: Payer: Self-pay

## 2020-03-31 ENCOUNTER — Encounter: Payer: Self-pay | Admitting: Orthopaedic Surgery

## 2020-03-31 DIAGNOSIS — M25511 Pain in right shoulder: Secondary | ICD-10-CM

## 2020-03-31 DIAGNOSIS — F3289 Other specified depressive episodes: Secondary | ICD-10-CM

## 2020-03-31 DIAGNOSIS — G8929 Other chronic pain: Secondary | ICD-10-CM | POA: Diagnosis not present

## 2020-03-31 DIAGNOSIS — M25512 Pain in left shoulder: Secondary | ICD-10-CM | POA: Diagnosis not present

## 2020-03-31 NOTE — Progress Notes (Signed)
Office Visit Note   Patient: [name] Foster           Date of Birth: 15-Apr-1966           MRN: [id number] Visit Date: 03/31/2020              Requested by: [name], MD [address] [place],  [place] [address] PCP: [name], MD   Assessment & Plan: Visit Diagnoses:  1. Chronic right shoulder pain   2. Chronic left shoulder pain     Plan: Cervical spine shows mild degenerative multilevel changes.  No stenosis.  Nothing that can explain his bilateral shoulder pain.  Overall he feels shoulder pain that is deep inside is worse with shoulder abduction and the use of the arm.  After consideration of his options he would like to wait for another round of shoulder injections with Dr. Junius Roads.  He will make those appointments accordingly.  Follow-up as needed with me.  Follow-Up Instructions: Return if symptoms worsen or fail to improve.   Orders:  No orders of the defined types were placed in this encounter.  No orders of the defined types were placed in this encounter.     Procedures: No procedures performed   Clinical Data: No additional findings.   Subjective: Chief Complaint  Patient presents with  . Neck - Pain    [name] returns today for MRI review of the cervical spine.   Review of Systems   Objective: Vital Signs: There were no vitals taken for this visit.  Physical Exam  Ortho Exam Exam is unchanged. Specialty Comments:  No specialty comments available.  Imaging: No results found.   PMFS History: Patient Active Problem List   Diagnosis Date Noted  . Chronic right shoulder pain 03/31/2020  . Chronic left shoulder pain 03/31/2020  . Urinary frequency 12/10/2019  . Dysuria 12/08/2019  . Acute respiratory failure with hypoxia ([place]) 07/24/2019  . Pneumonia due to COVID-19 virus 07/23/2019  . Bilateral inguinal hernia without obstruction or gangrene 04/11/2017  . Umbilical hernia without obstruction and without gangrene  04/11/2017  . Vitamin D deficiency 01/24/2017  . Type 2 diabetes mellitus without complication, without long-term current use of insulin ([place]) 01/24/2017  . Depression 01/24/2017  . Shortness of breath on exertion 11/27/2016  . Internal hemorrhoids   . Atypical chest pain   . Gastroesophageal reflux disease without esophagitis   . Preventative health care 10/10/2015  . Sleep apnea 10/10/2015  . Urinary hesitancy 09/30/2015  . Fatty liver 07/04/2015  . Edema 05/07/2014  . Scleroderma of esophagus ([place]) 03/20/2014  . Hiatal hernia with gastroesophageal reflux 03/20/2014  . Anxiety state 07/13/2013  . Erectile dysfunction 01/15/2013  . Back pain 03/09/2012  . Mesenteric panniculitis ([place]) 02/06/2012  . Heel pain, bilateral 04/25/2010  . Obesity 01/05/2010  . Hyperlipidemia 10/04/2009  . Essential hypertension 10/04/2009   Past Medical History:  Diagnosis Date  . Back pain   . Bone spur    left foot  . Chest pain   . Constipation   . COVID-19   . Diabetes mellitus type 2 in obese ([place]) 07/13/2013  . Diverticulosis   . Dyspnea   . Erectile dysfunction 01/15/2013  . Esophageal reflux 01/15/2013  . Fatty liver 07/04/2015  . GERD (gastroesophageal reflux disease)   . Hiatal hernia   . Hiatal hernia with gastroesophageal reflux 03/20/2014  . Hyperglycemia 07/13/2013  . Hyperplastic colon polyp   . Hypertension   .  Internal hemorrhoids   . Lower extremity edema   . Mixed hyperlipidemia   . Murmur   . OSA (obstructive sleep apnea)    s/p UPPP  . Plantar fasciitis   . Pneumonia due to COVID-19 virus   . Prediabetes   . Reflux esophagitis   . Shoulder pain   . Stomach ulcer    from PCP  . Urinary hesitancy 09/30/2015  . Vitamin D deficiency     Family History  Problem Relation Age of Onset  . Dementia Mother   . Diabetes Mother   . Hypertension Mother   . Heart failure Mother   . Stroke Mother   . Obesity Mother   . Hyperlipidemia Mother   . Heart disease Mother   .  Depression Mother   . Hypertension Other        siblings  . Diabetes Sister   . Cancer Brother        lung cancer smoker  . Heart attack Maternal Grandmother   . Diabetes Sister   . Pancreatic disease Sister   . Obesity Father   . Alcoholism Father   . Colon cancer Neg Hx     Past Surgical History:  Procedure Laterality Date  . [place] STUDY N/A 02/28/2016   Procedure: [place] STUDY;  Surgeon: Jerene Bears, MD;  [facility];  Service: Gastroenterology;  Laterality: N/A;  . ESOPHAGEAL MANOMETRY N/A 09/01/2013   Procedure: ESOPHAGEAL MANOMETRY (EM);  Surgeon: Sable Feil, MD;  [facility];  Service: Endoscopy;  Laterality: N/A;  . ESOPHAGEAL MANOMETRY N/A 02/28/2016   Procedure: ESOPHAGEAL MANOMETRY (EM);  Surgeon: Jerene Bears, MD;  [facility];  Service: Gastroenterology;  Laterality: N/A;  . FOOT TENDON TRANSFER Right   . INGUINAL HERNIA REPAIR    . TONSILLECTOMY    . UMBILICAL HERNIA REPAIR    . UVULECTOMY     Social History   Occupational History  . Occupation: Nature conservation officer: [employer]  Tobacco Use  . Smoking status: Never Smoker  . Smokeless tobacco: Never Used  Vaping Use  . Vaping Use: Never used  Substance and Sexual Activity  . Alcohol use: No  . Drug use: No  . Sexual activity: Yes    Partners: Female

## 2020-04-07 ENCOUNTER — Ambulatory Visit (INDEPENDENT_AMBULATORY_CARE_PROVIDER_SITE_OTHER): Payer: Commercial Managed Care - PPO | Admitting: Family Medicine

## 2020-04-12 ENCOUNTER — Telehealth: Payer: Self-pay | Admitting: Family Medicine

## 2020-04-12 NOTE — Telephone Encounter (Signed)
Caller: Jamichael Call back # [phone]  Patient states he tries to schedule a follow up at the [facility] spec, they did not have anything right away. Patient states he was told that if we request the appointment they will see him sooner.

## 2020-04-12 NOTE — Telephone Encounter (Signed)
Called [facility] to see when pt could be seen. She stated that  He already had an appointment for 04/20/20 at  11am and he was also placed on waiting list to be contacted if something comes open sooner. I called [name] Foster & left a voicemail of this information.

## 2020-04-19 ENCOUNTER — Other Ambulatory Visit: Payer: Self-pay

## 2020-04-19 ENCOUNTER — Encounter: Payer: Self-pay | Admitting: Family Medicine

## 2020-04-19 ENCOUNTER — Ambulatory Visit (INDEPENDENT_AMBULATORY_CARE_PROVIDER_SITE_OTHER): Payer: Commercial Managed Care - PPO | Admitting: Family Medicine

## 2020-04-19 ENCOUNTER — Ambulatory Visit: Payer: Self-pay

## 2020-04-19 ENCOUNTER — Telehealth: Payer: Self-pay

## 2020-04-19 DIAGNOSIS — G8929 Other chronic pain: Secondary | ICD-10-CM

## 2020-04-19 DIAGNOSIS — M25512 Pain in left shoulder: Secondary | ICD-10-CM

## 2020-04-19 NOTE — Progress Notes (Signed)
Office Visit Note   Patient: [name] Foster           Date of Birth: Oct 09, 1965           MRN: [id number] Visit Date: 04/19/2020 Requested by: [name], MD [address] [place],  [place] [address] PCP: [name], MD  Subjective: Chief Complaint  Patient presents with  . Left Shoulder - Pain  . Right Shoulder - Pain    HPI: He is here with bilateral shoulder pain.  He was initially scheduled for bilateral injections, but he has since decided that he would like to proceed with arthroscopic surgery on the right shoulder per Dr. Erlinda Hong.  Regarding his left shoulder, he has had subacromial and glenohumeral injections previously.  He thinks that the subacromial helped him the most.              ROS:   All other systems were reviewed and are negative.  Objective: Vital Signs: There were no vitals taken for this visit.  Physical Exam:  General:  Alert and oriented, in no acute distress. Pulm:  Breathing unlabored. Psy:  Normal mood, congruent affect.  Left shoulder: Full full active range of motion.  He is tender in the posterior subacromial space.  Rotator cuff strength is 5/5 throughout.  Imaging: No results found.  Assessment & Plan: 1.  Chronic bilateral shoulder pain -For the right shoulder will like to proceed with surgical intervention.  I will asked Dr. Erlinda Hong to have his scheduler call patient. -For the left shoulder we elected to do a subacromial injection today.     Procedures: Left shoulder subacromial injection: After sterile prep with Betadine, injected 5 cc 1% lidocaine without epinephrine and 40 mg methylprednisolone from posterior approach.    PMFS History: Patient Active Problem List   Diagnosis Date Noted  . Chronic right shoulder pain 03/31/2020  . Chronic left shoulder pain 03/31/2020  . Urinary frequency 12/10/2019  . Dysuria 12/08/2019  . Acute respiratory failure with hypoxia ([facility]) 07/24/2019  . Pneumonia due to COVID-19 virus  07/23/2019  . Bilateral inguinal hernia without obstruction or gangrene 04/11/2017  . Umbilical hernia without obstruction and without gangrene 04/11/2017  . Vitamin D deficiency 01/24/2017  . Type 2 diabetes mellitus without complication, without long-term current use of insulin ([facility]) 01/24/2017  . Depression 01/24/2017  . Shortness of breath on exertion 11/27/2016  . Internal hemorrhoids   . Atypical chest pain   . Gastroesophageal reflux disease without esophagitis   . Preventative health care 10/10/2015  . Sleep apnea 10/10/2015  . Urinary hesitancy 09/30/2015  . Fatty liver 07/04/2015  . Edema 05/07/2014  . Scleroderma of esophagus ([facility]) 03/20/2014  . Hiatal hernia with gastroesophageal reflux 03/20/2014  . Anxiety state 07/13/2013  . Erectile dysfunction 01/15/2013  . Back pain 03/09/2012  . Mesenteric panniculitis ([facility]) 02/06/2012  . Heel pain, bilateral 04/25/2010  . Obesity 01/05/2010  . Hyperlipidemia 10/04/2009  . Essential hypertension 10/04/2009   Past Medical History:  Diagnosis Date  . Back pain   . Bone spur    left foot  . Chest pain   . Constipation   . COVID-19   . Diabetes mellitus type 2 in obese ([facility]) 07/13/2013  . Diverticulosis   . Dyspnea   . Erectile dysfunction 01/15/2013  . Esophageal reflux 01/15/2013  . Fatty liver 07/04/2015  . GERD (gastroesophageal reflux disease)   . Hiatal hernia   . Hiatal hernia with gastroesophageal  reflux 03/20/2014  . Hyperglycemia 07/13/2013  . Hyperplastic colon polyp   . Hypertension   . Internal hemorrhoids   . Lower extremity edema   . Mixed hyperlipidemia   . Murmur   . OSA (obstructive sleep apnea)    s/p UPPP  . Plantar fasciitis   . Pneumonia due to COVID-19 virus   . Prediabetes   . Reflux esophagitis   . Shoulder pain   . Stomach ulcer    from PCP  . Urinary hesitancy 09/30/2015  . Vitamin D deficiency     Family History  Problem Relation Age of Onset  . Dementia Mother   . Diabetes Mother    . Hypertension Mother   . Heart failure Mother   . Stroke Mother   . Obesity Mother   . Hyperlipidemia Mother   . Heart disease Mother   . Depression Mother   . Hypertension Other        siblings  . Diabetes Sister   . Cancer Brother        lung cancer smoker  . Heart attack Maternal Grandmother   . Diabetes Sister   . Pancreatic disease Sister   . Obesity Father   . Alcoholism Father   . Colon cancer Neg Hx     Past Surgical History:  Procedure Laterality Date  . [place] STUDY N/A 02/28/2016   Procedure: [place] STUDY;  Surgeon: Jerene Bears, MD;  Location: [facility];  Service: Gastroenterology;  Laterality: N/A;  . ESOPHAGEAL MANOMETRY N/A 09/01/2013   Procedure: ESOPHAGEAL MANOMETRY (EM);  Surgeon: Sable Feil, MD;  [facility]: [facility];  Service: Endoscopy;  Laterality: N/A;  . ESOPHAGEAL MANOMETRY N/A 02/28/2016   Procedure: ESOPHAGEAL MANOMETRY (EM);  Surgeon: Jerene Bears, MD;  [facility];  Service: Gastroenterology;  Laterality: N/A;  . FOOT TENDON TRANSFER Right   . INGUINAL HERNIA REPAIR    . TONSILLECTOMY    . UMBILICAL HERNIA REPAIR    . UVULECTOMY     Social History   Occupational History  . Occupation: Nature conservation officer: [employer]  Tobacco Use  . Smoking status: Never Smoker  . Smokeless tobacco: Never Used  Vaping Use  . Vaping Use: Never used  Substance and Sexual Activity  . Alcohol use: No  . Drug use: No  . Sexual activity: Yes    Partners: Female

## 2020-04-19 NOTE — Telephone Encounter (Signed)
Record request from [employer] received and sent to [employer]

## 2020-04-20 ENCOUNTER — Telehealth: Payer: Self-pay | Admitting: Family Medicine

## 2020-04-20 NOTE — Telephone Encounter (Signed)
Patient called advised he need a note for his employer stating how long he will be out of work? Patient said he decided to go ahead and have surgery. The number to contact patient is [phone]

## 2020-04-21 NOTE — Telephone Encounter (Signed)
I called patient. Note entered and he will pull from My Chart. Tammy-he would like to know if you can forward this note to [employer]? He states that paperwork has been sent out to them previously.

## 2020-04-21 NOTE — Telephone Encounter (Signed)
Out of work 6 to 12 weeks

## 2020-04-21 NOTE — Telephone Encounter (Signed)
Please advise 

## 2020-04-21 NOTE — Telephone Encounter (Signed)
I called patient to advise and see what he would like to do about note. No answer, voicemail note set up.

## 2020-04-21 NOTE — Telephone Encounter (Signed)
We'll let Dr. Erlinda Hong determine that since it will likely depend on what he finds during surgery.

## 2020-04-22 NOTE — Telephone Encounter (Signed)
Faxed 8/11 work note to [employer]

## 2020-04-23 ENCOUNTER — Telehealth: Payer: Self-pay | Admitting: Orthopaedic Surgery

## 2020-04-23 NOTE — Telephone Encounter (Signed)
Received call from [employer] requesting the last ov note and the completed form. I faxed the 8/9 ov note and advised the form will come from [employer] and gave them phone number for [employer].

## 2020-04-26 ENCOUNTER — Institutional Professional Consult (permissible substitution): Payer: Commercial Managed Care - PPO | Admitting: Internal Medicine

## 2020-04-27 ENCOUNTER — Ambulatory Visit (INDEPENDENT_AMBULATORY_CARE_PROVIDER_SITE_OTHER): Payer: Commercial Managed Care - PPO | Admitting: Orthopaedic Surgery

## 2020-04-27 VITALS — Ht 72.5 in | Wt 268.0 lb

## 2020-04-27 DIAGNOSIS — S43431A Superior glenoid labrum lesion of right shoulder, initial encounter: Secondary | ICD-10-CM

## 2020-04-27 NOTE — Progress Notes (Signed)
Office Visit Note   Patient: [name] Foster           Date of Birth: [birth year]           MRN: [id number] Visit Date: 04/27/2020              Requested by: [name], MD [address] [place],   [id number] PCP: [name], MD   Assessment & Plan: Visit Diagnoses:  1. Superior labrum anterior-to-posterior (SLAP) tear of right shoulder     Plan: Due to continued pain despite conservative treatment and based on discussion of options he would like to move forward with arthroscopic surgical treatment of the SLAP tear.  Discussed with him that the plan is to perform a biceps tenodesis and extensive debridement based on intraoperative findings.  Questions encouraged and answered.  We will contact him in the near future to schedule surgery.  Follow-Up Instructions: Return if symptoms worsen or fail to improve.   Orders:  No orders of the defined types were placed in this encounter.  No orders of the defined types were placed in this encounter.     Procedures: No procedures performed   Clinical Data: No additional findings.   Subjective: Chief Complaint  Patient presents with  . Right Shoulder - Pain  . Left Shoulder - Pain    Nymir follows up today for continued right shoulder pain.  He did have the MRI which showed a SLAP tear with a very small paralabral cyst.  Left shoulder is doing little better since the injection.   Review of Systems   Objective: Vital Signs: There were no vitals taken for this visit.  Physical Exam  Ortho Exam Right shoulder exam is unchanged. Specialty Comments:  No specialty comments available.  Imaging: No results found.   PMFS History: Patient Active Problem List   Diagnosis Date Noted  . Superior labrum anterior-to-posterior (SLAP) tear of right shoulder 04/27/2020  . Chronic right shoulder pain 03/31/2020  . Chronic left shoulder pain 03/31/2020  . Urinary frequency 12/10/2019  . Dysuria  12/08/2019  . Acute respiratory failure with hypoxia ([facility]) 07/24/2019  . Pneumonia due to COVID-19 virus 07/23/2019  . Bilateral inguinal hernia without obstruction or gangrene 04/11/2017  . Umbilical hernia without obstruction and without gangrene 04/11/2017  . Vitamin D deficiency 01/24/2017  . Type 2 diabetes mellitus without complication, without long-term current use of insulin ([facility]) 01/24/2017  . Depression 01/24/2017  . Shortness of breath on exertion 11/27/2016  . Internal hemorrhoids   . Atypical chest pain   . Gastroesophageal reflux disease without esophagitis   . Preventative health care 10/10/2015  . Sleep apnea 10/10/2015  . Urinary hesitancy 09/30/2015  . Fatty liver 07/04/2015  . Edema 05/07/2014  . Scleroderma of esophagus ([facility]) 03/20/2014  . Hiatal hernia with gastroesophageal reflux 03/20/2014  . Anxiety state 07/13/2013  . Erectile dysfunction 01/15/2013  . Back pain 03/09/2012  . Mesenteric panniculitis ([facility]) 02/06/2012  . Heel pain, bilateral 04/25/2010  . Obesity 01/05/2010  . Hyperlipidemia 10/04/2009  . Essential hypertension 10/04/2009   Past Medical History:  Diagnosis Date  . Back pain   . Bone spur    left foot  . Chest pain   . Constipation   . COVID-19   . Diabetes mellitus type 2 in obese ([facility]) 07/13/2013  . Diverticulosis   . Dyspnea   . Erectile dysfunction 01/15/2013  . Esophageal reflux 01/15/2013  . Fatty liver 07/04/2015  .  GERD (gastroesophageal reflux disease)   . Hiatal hernia   . Hiatal hernia with gastroesophageal reflux 03/20/2014  . Hyperglycemia 07/13/2013  . Hyperplastic colon polyp   . Hypertension   . Internal hemorrhoids   . Lower extremity edema   . Mixed hyperlipidemia   . Murmur   . OSA (obstructive sleep apnea)    s/p UPPP  . Plantar fasciitis   . Pneumonia due to COVID-19 virus   . Prediabetes   . Reflux esophagitis   . Shoulder pain   . Stomach ulcer    from PCP  . Urinary hesitancy 09/30/2015  . Vitamin D  deficiency     Family History  Problem Relation Age of Onset  . Dementia Mother   . Diabetes Mother   . Hypertension Mother   . Heart failure Mother   . Stroke Mother   . Obesity Mother   . Hyperlipidemia Mother   . Heart disease Mother   . Depression Mother   . Hypertension Other        siblings  . Diabetes Sister   . Cancer Brother        lung cancer smoker  . Heart attack Maternal Grandmother   . Diabetes Sister   . Pancreatic disease Sister   . Obesity Father   . Alcoholism Father   . Colon cancer Neg Hx     Past Surgical History:  Procedure Laterality Date  . [place] STUDY N/A 02/28/2016   Procedure: [place] STUDY;  Surgeon: Jerene Bears, MD;  [facility];  Service: Gastroenterology;  Laterality: N/A;  . ESOPHAGEAL MANOMETRY N/A 09/01/2013   Procedure: ESOPHAGEAL MANOMETRY (EM);  Surgeon: Sable Feil, MD;  [facility];  Service: Endoscopy;  Laterality: N/A;  . ESOPHAGEAL MANOMETRY N/A 02/28/2016   Procedure: ESOPHAGEAL MANOMETRY (EM);  Surgeon: Jerene Bears, MD;  [facility];  Service: Gastroenterology;  Laterality: N/A;  . FOOT TENDON TRANSFER Right   . INGUINAL HERNIA REPAIR    . TONSILLECTOMY    . UMBILICAL HERNIA REPAIR    . UVULECTOMY     Social History   Occupational History  . Occupation: Nature conservation officer: [employer]  Tobacco Use  . Smoking status: Never Smoker  . Smokeless tobacco: Never Used  Vaping Use  . Vaping Use: Never used  Substance and Sexual Activity  . Alcohol use: No  . Drug use: No  . Sexual activity: Yes    Partners: Female

## 2020-04-29 ENCOUNTER — Encounter: Payer: Self-pay | Admitting: Pulmonary Disease

## 2020-04-29 ENCOUNTER — Ambulatory Visit (INDEPENDENT_AMBULATORY_CARE_PROVIDER_SITE_OTHER): Payer: Commercial Managed Care - PPO | Admitting: Pulmonary Disease

## 2020-04-29 ENCOUNTER — Other Ambulatory Visit: Payer: Self-pay

## 2020-04-29 VITALS — BP 134/84 | HR 68 | Temp 94.8°F | Ht 72.0 in | Wt 265.0 lb

## 2020-04-29 DIAGNOSIS — R0600 Dyspnea, unspecified: Secondary | ICD-10-CM

## 2020-04-29 NOTE — Progress Notes (Signed)
[name] Foster    [id number]    1966-06-09  Primary Care Physician:[name], [name], MD  Referring Physician: [name], MD [address] [place],  [name]  Chief complaint:   Patient is being seen for shortness of breath on exertion  HPI:  Patient with a history of hypertension, diastolic dysfunction on echo in the past  Shortness of breath on exertion  Did have Covid November/December last year for which she was hospitalized for about 5 days  He gets short of breath with moderate exertion If he is walking on level ground he states he usually does not need to stop because of shortness of breath  He does have occasional chest discomfort which he stated started around the time when he had Covid and just never really went away  Never smoker Does not use alcohol  Outpatient Encounter Medications as of 04/29/2020  Medication Sig  . acetaminophen (TYLENOL) 500 MG tablet Take 1 tablet (500 mg total) by mouth every 6 (six) hours as needed.  [name] amLODipine (NORVASC) 10 MG tablet Take 1 tablet (10 mg total) by mouth daily.  [name] ascorbic acid (VITAMIN C) 500 MG tablet Take 500 mg by mouth daily.  [name] aspirin EC 81 MG tablet Take 1 tablet (81 mg total) by mouth 2 (two) times daily.  Pleas Patricia Microlet Lancets lancets Test blood sugars twice daily  . Blood Glucose Monitoring Suppl (ACCU-CHEK GUIDE ME) w/Device KIT   . Blood Glucose Monitoring Suppl (CONTOUR NEXT MONITOR) w/Device KIT 1 kit by Does not apply route 2 (two) times daily.  [name] buPROPion (WELLBUTRIN SR) 150 MG 12 hr tablet Take 1 tablet (150 mg total) by mouth daily.  . carvedilol (COREG) 12.5 MG tablet Take 1 tablet (12.5 mg total) by mouth 2 (two) times daily with a meal.  . Cholecalciferol (VITAMIN D) 50 MCG (2000 UT) CAPS Take 1 capsule by mouth daily.  . famotidine (PEPCID) 20 MG tablet Take 1 tablet (20 mg total) by mouth 2 (two) times daily.  . fluticasone (FLONASE) 50 MCG/ACT nasal spray  Place 2 sprays into both nostrils daily.  [name] glucose blood (CONTOUR NEXT TEST) test strip Test blood sugars twice daily  . ibuprofen (ADVIL) 600 MG tablet Take 600 mg by mouth every 8 (eight) hours as needed.   . melatonin 5 MG TABS Take 5 mg by mouth at bedtime.  . metFORMIN (GLUCOPHAGE) 500 MG tablet Take 1 tablet (500 mg total) by mouth 2 (two) times daily with a meal.  . Misc. Devices (PULSE OXIMETER FOR FINGER) MISC 1 Device by Does not apply route as needed (SOB, fatigue, headache patient with COVID).  . Multiple Vitamins-Minerals (MULTIVITAMIN WITH MINERALS) tablet Take 1 tablet by mouth daily.  [name] omeprazole (PRILOSEC) 40 MG capsule Take 1 capsule by mouth once daily in the morning  . phenazopyridine (PYRIDIUM) 200 MG tablet Take 1 tablet (200 mg total) by mouth 3 (three) times daily.  [name] PROAIR HFA 108 (90 Base) MCG/ACT inhaler Inhale 1-2 puffs into the lungs every 6 (six) hours as needed for wheezing or shortness of breath.  . rosuvastatin (CRESTOR) 20 MG tablet Take 1 tablet (20 mg total) by mouth daily.  . tadalafil (CIALIS) 5 MG tablet Take 1 tablet (5 mg total) by mouth daily as needed for erectile dysfunction.  [name] tiZANidine (ZANAFLEX) 4 MG tablet Take 0.5-1 tablets (2-4 mg total) by mouth 2 (two) times daily  as needed for muscle spasms.  [name] triamterene-hydrochlorothiazide (MAXZIDE-25) 37.5-25 MG tablet Take 1 tablet by mouth daily.  . Vitamin D, Ergocalciferol, (DRISDOL) 1.25 MG (50000 UNIT) CAPS capsule TAKE 1 CAPSULE BY MOUTH ONCE A WEEK  . zinc gluconate 50 MG tablet Take 50 mg by mouth daily.  . [DISCONTINUED] amitriptyline (ELAVIL) 25 MG tablet Take 1 tablet (25 mg total) by mouth at bedtime. (Patient not taking: Reported on 07/14/2019)   No facility-administered encounter medications on file as of 04/29/2020.    Allergies as of 04/29/2020 - Review Complete 04/29/2020  Allergen Reaction Noted  . Hydrocodone Itching 01/31/2016  . Tramadol Itching 08/29/2018    Past Medical  History:  Diagnosis Date  . Back pain   . Bone spur    left foot  . Chest pain   . Constipation   . COVID-19   . Diabetes mellitus type 2 in obese ([facility]) 07/13/2013  . Diverticulosis   . Dyspnea   . Erectile dysfunction 01/15/2013  . Esophageal reflux 01/15/2013  . Fatty liver 07/04/2015  . GERD (gastroesophageal reflux disease)   . Hiatal hernia   . Hiatal hernia with gastroesophageal reflux 03/20/2014  . Hyperglycemia 07/13/2013  . Hyperplastic colon polyp   . Hypertension   . Internal hemorrhoids   . Lower extremity edema   . Mixed hyperlipidemia   . Murmur   . OSA (obstructive sleep apnea)    s/p UPPP  . Plantar fasciitis   . Pneumonia due to COVID-19 virus   . Prediabetes   . Reflux esophagitis   . Shoulder pain   . Stomach ulcer    from PCP  . Urinary hesitancy 09/30/2015  . Vitamin D deficiency     Past Surgical History:  Procedure Laterality Date  . [place] STUDY N/A 02/28/2016   Procedure: [place] STUDY;  Surgeon: Jerene Bears, MD;  [facility];  Service: Gastroenterology;  Laterality: N/A;  . ESOPHAGEAL MANOMETRY N/A 09/01/2013   Procedure: ESOPHAGEAL MANOMETRY (EM);  Surgeon: Sable Feil, MD;  [facility];  Service: Endoscopy;  Laterality: N/A;  . ESOPHAGEAL MANOMETRY N/A 02/28/2016   Procedure: ESOPHAGEAL MANOMETRY (EM);  Surgeon: Jerene Bears, MD;  [facility];  Service: Gastroenterology;  Laterality: N/A;  . FOOT TENDON TRANSFER Right   . INGUINAL HERNIA REPAIR    . TONSILLECTOMY    . UMBILICAL HERNIA REPAIR    . UVULECTOMY      Family History  Problem Relation Age of Onset  . Dementia Mother   . Diabetes Mother   . Hypertension Mother   . Heart failure Mother   . Stroke Mother   . Obesity Mother   . Hyperlipidemia Mother   . Heart disease Mother   . Depression Mother   . Hypertension Other        siblings  . Diabetes Sister   . Cancer Brother        lung cancer smoker  . Heart attack Maternal  Grandmother   . Diabetes Sister   . Pancreatic disease Sister   . Obesity Father   . Alcoholism Father   . Colon cancer Neg Hx     Social History   Socioeconomic History  . Marital status: Married    Spouse name: [name]  . Number of children: Not on file  . Years of education: Not on file  . Highest education level: Not on file  Occupational History  . Occupation: Scientist, forensic  Employer: [employer]  Tobacco Use  . Smoking status: Never Smoker  . Smokeless tobacco: Never Used  Vaping Use  . Vaping Use: Never used  Substance and Sexual Activity  . Alcohol use: No  . Drug use: No  . Sexual activity: Yes    Partners: Female  Other Topics Concern  . Not on file  Social History Narrative   Tobacco Use - No.    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 Difficulty of Paying Living Expenses: Not on file  Food Insecurity:   . Worried About Charity fundraiser in the Last Year: Not on file  . Ran Out of Food in the Last Year: Not on file  Transportation Needs:   . Lack of Transportation (Medical): Not on file  . Lack of Transportation (Non-Medical): Not on file  Physical Activity:   . Days of Exercise per Week: Not on file  . Minutes of Exercise per Session: Not on file  Stress:   . Feeling of Stress : Not on file  Social Connections:   . Frequency of Communication with Friends and Family: Not on file  . Frequency of Social Gatherings with Friends and Family: Not on file  . Attends Religious Services: Not on file  . Active Member of Clubs or Organizations: Not on file  . Attends Archivist Meetings: Not on file  . Marital Status:  Not on file  Intimate Partner Violence:   . Fear of Current or Ex-Partner: Not on file  . Emotionally Abused: Not on file  . Physically Abused: Not on file  . Sexually Abused: Not on file    Review of Systems  Constitutional: Negative for fatigue.  HENT: Negative.   Respiratory: Positive for shortness of breath.   Cardiovascular: Positive for chest pain.  Genitourinary: Negative.   Musculoskeletal: Negative.     Vitals:   04/29/20 0957  BP: 134/84  Pulse: 68  Temp: (!) 94.8 F (34.9 C)  SpO2: 96%     Physical Exam Constitutional:      Appearance: He is obese.  HENT:     Head: Normocephalic.     Nose: Nose normal.     Mouth/Throat:     Mouth: Mucous membranes are moist.     Comments: Crowded oropharynx Mallampati 3 Eyes:     Pupils: Pupils are equal, round, and reactive to light.  Cardiovascular:     Rate and Rhythm: Normal rate and regular rhythm.     Pulses: Normal pulses.     Heart sounds: Normal heart sounds. No murmur heard.  No friction rub.  Pulmonary:     Effort: Pulmonary effort is normal. No respiratory distress.     Breath sounds: No stridor. No wheezing or rhonchi.  Musculoskeletal:        General: Normal range of motion.     Cervical back: No rigidity or tenderness.  Skin:    General: Skin is warm.  Neurological:     General: No  focal deficit present.     Mental Status: He is alert.    Data Reviewed: previous sleep study reviewed showing moderate obstructive sleep apnea  Echocardiogram reviewed showing diastolic dysfunction  Spirometry from 01/17/2016 reviewed showing normal spirometry  Assessment:   Shortness of breath with exertion  Past history of obstructive sleep apnea  Obesity  Plan/Recommendations: Graded exercises as tolerated  Obtain an echocardiogram  Obtain a PFT  Weight loss efforts encouraged  Pathophysiology of sleep disordered breathing discussed with the patient Options of treatment discussed  The importance  of weight management and how it contributes to worsening sleep disordered breathing was discussed  Follow-up in 6 to 8 weeks   Sherrilyn Rist MD [facility] 04/29/2020, 11:01 AM  CC: [name], MD

## 2020-04-29 NOTE — Patient Instructions (Addendum)
Shortness of breath on exertion  Encourage graded exercises  Obtain pulmonary function test Obtain echocardiogram for dyspnea on exertion  Consider repeating sleep study  Strongly consider getting vaccinated  We will see you back in 6 to 8 weeks Sleep Apnea Sleep apnea affects breathing during sleep. It causes breathing to stop for a short time or to become shallow. It can also increase the risk of:  Heart attack.  Stroke.  Being very overweight (obese).  Diabetes.  Heart failure.  Irregular heartbeat. The goal of treatment is to help you breathe normally again. What are the causes? There are three kinds of sleep apnea:  Obstructive sleep apnea. This is caused by a blocked or collapsed airway.  Central sleep apnea. This happens when the brain does not send the right signals to the muscles that control breathing.  Mixed sleep apnea. This is a combination of obstructive and central sleep apnea. The most common cause of this condition is a collapsed or blocked airway. This can happen if:  Your throat muscles are too relaxed.  Your tongue and tonsils are too large.  You are overweight.  Your airway is too small. What increases the risk?  Being overweight.  Smoking.  Having a small airway.  Being older.  Being male.  Drinking alcohol.  Taking medicines to calm yourself (sedatives or tranquilizers).  Having family members with the condition. What are the signs or symptoms?  Trouble staying asleep.  Being sleepy or tired during the day.  Getting angry a lot.  Loud snoring.  Headaches in the morning.  Not being able to focus your mind (concentrate).  Forgetting things.  Less interest in sex.  Mood swings.  Personality changes.  Feelings of sadness (depression).  Waking up a lot during the night to pee (urinate).  Dry mouth.  Sore throat. How is this diagnosed?  Your medical history.  A physical exam.  A test that is done when you  are sleeping (sleep study). The test is most often done in a sleep lab but may also be done at home. How is this treated?   Sleeping on your side.  Using a medicine to get rid of mucus in your nose (decongestant).  Avoiding the use of alcohol, medicines to help you relax, or certain pain medicines (narcotics).  Losing weight, if needed.  Changing your diet.  Not smoking.  Using a machine to open your airway while you sleep, such as: ? An oral appliance. This is a mouthpiece that shifts your lower jaw forward. ? A CPAP device. This device blows air through a mask when you breathe out (exhale). ? An EPAP device. This has valves that you put in each nostril. ? A BPAP device. This device blows air through a mask when you breathe in (inhale) and breathe out.  Having surgery if other treatments do not work. It is important to get treatment for sleep apnea. Without treatment, it can lead to:  High blood pressure.  Coronary artery disease.  In men, not being able to have an erection (impotence).  Reduced thinking ability. Follow these instructions at home: Lifestyle  Make changes that your doctor recommends.  Eat a healthy diet.  Lose weight if needed.  Avoid alcohol, medicines to help you relax, and some pain medicines.  Do not use any products that contain nicotine or tobacco, such as cigarettes, e-cigarettes, and chewing tobacco. If you need help quitting, ask your doctor. General instructions  Take over-the-counter and prescription medicines only as  told by your doctor.  If you were given a machine to use while you sleep, use it only as told by your doctor.  If you are having surgery, make sure to tell your doctor you have sleep apnea. You may need to bring your device with you.  Keep all follow-up visits as told by your doctor. This is important. Contact a doctor if:  The machine that you were given to use during sleep bothers you or does not seem to be  working.  You do not get better.  You get worse. Get help right away if:  Your chest hurts.  You have trouble breathing in enough air.  You have an uncomfortable feeling in your back, arms, or stomach.  You have trouble talking.  One side of your body feels weak.  A part of your face is hanging down. These symptoms may be an emergency. Do not wait to see if the symptoms will go away. Get medical help right away. Call your local emergency services (911 in the [place] Do not drive yourself to the hospital. Summary  This condition affects breathing during sleep.  The most common cause is a collapsed or blocked airway.  The goal of treatment is to help you breathe normally while you sleep. This information is not intended to replace advice given to you by your health care provider. Make sure you discuss any questions you have with your health care provider. Document Revised: 06/14/2018 Document Reviewed: 04/23/2018 Elsevier Patient Education  [place].

## 2020-05-07 ENCOUNTER — Other Ambulatory Visit (INDEPENDENT_AMBULATORY_CARE_PROVIDER_SITE_OTHER): Payer: Self-pay | Admitting: Family Medicine

## 2020-05-07 DIAGNOSIS — E559 Vitamin D deficiency, unspecified: Secondary | ICD-10-CM

## 2020-05-07 NOTE — Telephone Encounter (Signed)
Patient states he would like a 90 day supply of Vitamin D

## 2020-05-19 ENCOUNTER — Other Ambulatory Visit: Payer: Self-pay

## 2020-05-19 ENCOUNTER — Ambulatory Visit (HOSPITAL_COMMUNITY): Payer: Self-pay | Attending: Cardiology

## 2020-05-19 DIAGNOSIS — R0600 Dyspnea, unspecified: Secondary | ICD-10-CM

## 2020-05-19 LAB — ECHOCARDIOGRAM COMPLETE
Area-P 1/2: 3 cm2
S' Lateral: 2.1 cm

## 2020-06-01 ENCOUNTER — Other Ambulatory Visit (INDEPENDENT_AMBULATORY_CARE_PROVIDER_SITE_OTHER): Payer: Self-pay | Admitting: Family Medicine

## 2020-06-01 DIAGNOSIS — E559 Vitamin D deficiency, unspecified: Secondary | ICD-10-CM

## 2020-06-04 ENCOUNTER — Ambulatory Visit: Payer: Self-pay | Admitting: Family Medicine

## 2020-06-15 ENCOUNTER — Other Ambulatory Visit (INDEPENDENT_AMBULATORY_CARE_PROVIDER_SITE_OTHER): Payer: Self-pay | Admitting: Family Medicine

## 2020-06-15 DIAGNOSIS — E559 Vitamin D deficiency, unspecified: Secondary | ICD-10-CM

## 2020-06-21 ENCOUNTER — Ambulatory Visit (INDEPENDENT_AMBULATORY_CARE_PROVIDER_SITE_OTHER): Payer: Self-pay | Admitting: Family Medicine

## 2020-06-21 ENCOUNTER — Other Ambulatory Visit: Payer: Self-pay

## 2020-06-21 ENCOUNTER — Encounter: Payer: Self-pay | Admitting: Family Medicine

## 2020-06-21 DIAGNOSIS — M545 Low back pain, unspecified: Secondary | ICD-10-CM

## 2020-06-21 DIAGNOSIS — G8929 Other chronic pain: Secondary | ICD-10-CM

## 2020-06-21 DIAGNOSIS — M25511 Pain in right shoulder: Secondary | ICD-10-CM

## 2020-06-21 NOTE — Progress Notes (Signed)
Office Visit Note   Patient: [name] Foster           Date of Birth: 03-15-1966           MRN: [id number] Visit Date: 06/21/2020 Requested by: [name], MD [address] [place],  [place] [address] PCP: [name], MD  Subjective: Chief Complaint  Patient presents with  . Lower Back - Pain    Continues to have pain in the center of the lower back. Would like to have another ESI with Dr. Ernestina Patches, as it helped before,  . Right Shoulder - Pain    The patient postponed the surgery for the right shoulder. Requesting another cortisone injection, at least in the right shoulder (had subacromial injection in the left shoulder in August).  . Left Shoulder - Pain    HPI: He is here with persistent bilateral shoulder pain and low back pain.  Last left shoulder subacromial injection was in July.  He was planning to have right shoulder surgery but decided to postpone it for at least another month.  He has wondering if he can get another injection.  None of the injections make him pain-free but it does seem to take the edge off his pain.  Cervical MRI scan in July showed no obvious nerve impingement.  His low back pain has responded to epidural injections and he would like to have another one.                ROS:   All other systems were reviewed and are negative.  Objective: Vital Signs: There were no vitals taken for this visit.  Physical Exam:  General:  Alert and oriented, in no acute distress. Pulm:  Breathing unlabored. Psy:  Normal mood, congruent affect.  Right shoulder: Full active range of motion with pain at the extremes.  5/5 rotator cuff strength.  He is tender in the posterior and lateral subacromial space.   Imaging: No results found.  Assessment & Plan: 1.  Chronic bilateral shoulder pain -We will inject the right shoulder subacromial space today.  2.  Chronic low back pain -Referral for another epidural injection.     Procedures: Right  shoulder injection: After sterile prep with Betadine, injected 5 cc 1% lidocaine without epinephrine and 6 mg betamethason from posterior approach into the subacromial space.    PMFS History: Patient Active Problem List   Diagnosis Date Noted  . Superior labrum anterior-to-posterior (SLAP) tear of right shoulder 04/27/2020  . Chronic right shoulder pain 03/31/2020  . Chronic left shoulder pain 03/31/2020  . Urinary frequency 12/10/2019  . Dysuria 12/08/2019  . Acute respiratory failure with hypoxia ([place]) 07/24/2019  . Pneumonia due to COVID-19 virus 07/23/2019  . Bilateral inguinal hernia without obstruction or gangrene 04/11/2017  . Umbilical hernia without obstruction and without gangrene 04/11/2017  . Vitamin D deficiency 01/24/2017  . Type 2 diabetes mellitus without complication, without long-term current use of insulin ([place]) 01/24/2017  . Depression 01/24/2017  . Shortness of breath on exertion 11/27/2016  . Internal hemorrhoids   . Atypical chest pain   . Gastroesophageal reflux disease without esophagitis   . Preventative health care 10/10/2015  . Sleep apnea 10/10/2015  . Urinary hesitancy 09/30/2015  . Fatty liver 07/04/2015  . Edema 05/07/2014  . Scleroderma of esophagus ([place]) 03/20/2014  . Hiatal hernia with gastroesophageal reflux 03/20/2014  . Anxiety state 07/13/2013  . Erectile dysfunction 01/15/2013  . Back pain 03/09/2012  .  Mesenteric panniculitis ([place]) 02/06/2012  . Heel pain, bilateral 04/25/2010  . Obesity 01/05/2010  . Hyperlipidemia 10/04/2009  . Essential hypertension 10/04/2009   Past Medical History:  Diagnosis Date  . Back pain   . Bone spur    left foot  . Chest pain   . Constipation   . COVID-19   . Diabetes mellitus type 2 in obese ([place]) 07/13/2013  . Diverticulosis   . Dyspnea   . Erectile dysfunction 01/15/2013  . Esophageal reflux 01/15/2013  . Fatty liver 07/04/2015  . GERD (gastroesophageal reflux disease)   . Hiatal hernia   .  Hiatal hernia with gastroesophageal reflux 03/20/2014  . Hyperglycemia 07/13/2013  . Hyperplastic colon polyp   . Hypertension   . Internal hemorrhoids   . Lower extremity edema   . Mixed hyperlipidemia   . Murmur   . OSA (obstructive sleep apnea)    s/p UPPP  . Plantar fasciitis   . Pneumonia due to COVID-19 virus   . Prediabetes   . Reflux esophagitis   . Shoulder pain   . Stomach ulcer    from PCP  . Urinary hesitancy 09/30/2015  . Vitamin D deficiency     Family History  Problem Relation Age of Onset  . Dementia Mother   . Diabetes Mother   . Hypertension Mother   . Heart failure Mother   . Stroke Mother   . Obesity Mother   . Hyperlipidemia Mother   . Heart disease Mother   . Depression Mother   . Hypertension Other        siblings  . Diabetes Sister   . Cancer Brother        lung cancer smoker  . Heart attack Maternal Grandmother   . Diabetes Sister   . Pancreatic disease Sister   . Obesity Father   . Alcoholism Father   . Colon cancer Neg Hx     Past Surgical History:  Procedure Laterality Date  . [place] STUDY N/A 02/28/2016   Procedure: [place] STUDY;  Surgeon: Jerene Bears, MD;  Location: [facility];  Service: Gastroenterology;  Laterality: N/A;  . ESOPHAGEAL MANOMETRY N/A 09/01/2013   Procedure: ESOPHAGEAL MANOMETRY (EM);  Surgeon: Sable Feil, MD;  Location: [facility];  Service: Endoscopy;  Laterality: N/A;  . ESOPHAGEAL MANOMETRY N/A 02/28/2016   Procedure: ESOPHAGEAL MANOMETRY (EM);  Surgeon: Jerene Bears, MD;  Location: [facility];  Service: Gastroenterology;  Laterality: N/A;  . FOOT TENDON TRANSFER Right   . INGUINAL HERNIA REPAIR    . TONSILLECTOMY    . UMBILICAL HERNIA REPAIR    . UVULECTOMY     Social History   Occupational History  . Occupation: Nature conservation officer: [employer]  Tobacco Use  . Smoking status: Never Smoker  . Smokeless tobacco: Never Used  Vaping Use  . Vaping Use: Never used  Substance  and Sexual Activity  . Alcohol use: No  . Drug use: No  . Sexual activity: Yes    Partners: Female

## 2020-08-10 ENCOUNTER — Telehealth: Payer: Self-pay | Admitting: Family Medicine

## 2020-08-10 NOTE — Telephone Encounter (Signed)
Patient submitted medical release form, long term disability, and $25.00 cash payment to [employer]. Accepted 08/10/20.

## 2020-08-12 ENCOUNTER — Ambulatory Visit (INDEPENDENT_AMBULATORY_CARE_PROVIDER_SITE_OTHER): Payer: No Typology Code available for payment source | Admitting: Family Medicine

## 2020-08-12 ENCOUNTER — Other Ambulatory Visit: Payer: Self-pay

## 2020-08-12 ENCOUNTER — Encounter: Payer: Self-pay | Admitting: Family Medicine

## 2020-08-12 ENCOUNTER — Ambulatory Visit: Payer: Commercial Managed Care - PPO | Admitting: Family Medicine

## 2020-08-12 VITALS — BP 148/90 | HR 71 | Temp 97.3°F | Resp 16 | Wt 274.0 lb

## 2020-08-12 DIAGNOSIS — Z23 Encounter for immunization: Secondary | ICD-10-CM

## 2020-08-12 DIAGNOSIS — M545 Low back pain, unspecified: Secondary | ICD-10-CM | POA: Diagnosis not present

## 2020-08-12 DIAGNOSIS — K219 Gastro-esophageal reflux disease without esophagitis: Secondary | ICD-10-CM

## 2020-08-12 DIAGNOSIS — E669 Obesity, unspecified: Secondary | ICD-10-CM

## 2020-08-12 DIAGNOSIS — K449 Diaphragmatic hernia without obstruction or gangrene: Secondary | ICD-10-CM

## 2020-08-12 DIAGNOSIS — I10 Essential (primary) hypertension: Secondary | ICD-10-CM

## 2020-08-12 DIAGNOSIS — G8929 Other chronic pain: Secondary | ICD-10-CM

## 2020-08-12 DIAGNOSIS — E1169 Type 2 diabetes mellitus with other specified complication: Secondary | ICD-10-CM

## 2020-08-12 LAB — MICROALBUMIN / CREATININE URINE RATIO
Creatinine,U: 65.7 mg/dL
Microalb Creat Ratio: 6.3 mg/g (ref 0.0–30.0)
Microalb, Ur: 4.1 mg/dL — ABNORMAL HIGH (ref 0.0–1.9)

## 2020-08-12 MED ORDER — BUPROPION HCL ER (SR) 150 MG PO TB12
150.0000 mg | ORAL_TABLET | Freq: Every day | ORAL | 0 refills | Status: DC
Start: 2020-08-12 — End: 2020-08-19

## 2020-08-12 MED ORDER — AMLODIPINE BESYLATE 10 MG PO TABS
10.0000 mg | ORAL_TABLET | Freq: Every day | ORAL | 1 refills | Status: DC
Start: 2020-08-12 — End: 2020-11-12

## 2020-08-12 MED ORDER — VITAMIN D 50 MCG (2000 UT) PO CAPS: 1.0000 | ORAL_CAPSULE | Freq: Every day | ORAL | 0 refills | Status: DC

## 2020-08-12 MED ORDER — TRIAMTERENE-HCTZ 37.5-25 MG PO TABS: 1.0000 | ORAL_TABLET | Freq: Every day | ORAL | 1 refills | Status: DC

## 2020-08-12 MED ORDER — IBUPROFEN 600 MG PO TABS
600.0000 mg | ORAL_TABLET | Freq: Three times a day (TID) | ORAL | 0 refills | Status: DC | PRN
Start: 2020-08-12 — End: 2021-05-25

## 2020-08-12 MED ORDER — TAMSULOSIN HCL 0.4 MG PO CAPS: 0.4000 mg | ORAL_CAPSULE | Freq: Every day | ORAL | 0 refills | Status: DC

## 2020-08-12 MED ORDER — PANTOPRAZOLE SODIUM 40 MG PO TBEC: 40.0000 mg | DELAYED_RELEASE_TABLET | Freq: Every day | ORAL | 3 refills | Status: DC

## 2020-08-12 NOTE — Progress Notes (Signed)
Subjective:   Chief Complaint  Patient presents with  . Follow-up    3 month    [name] Foster is a 54 y.o. male here for follow-up of diabetes.   [facility] self monitored glucose range is low 100's.  Patient denies hypoglycemic reactions. He checks his glucose levels 1 time(s) per day. Patient does not require insulin.   Medications include: metformin 500 mg bid Diet is "terrible".  Exercise: walking, cycling  Hypertension Patient presents for hypertension follow up. He does monitor home blood pressures. Blood pressures ranging on average from 130's/80's. He is compliant with medications- Maxzide hctz, Norvasc, Coreg. Patient has these side effects of medication: none Diet/exercise as above.   GERD Pt w hx of GERD on Prilosec 40 mg/d. Compliant, no AE's. Has been having burning in chest region over past year. Admittedly, it is getting worse amidst recent weight gain. No N/V, bleeding, wt loss.   LBP Pt w hx of LBP. No inj or change in activity. Feels assoc leg weakness. No bowel/bladder changes. Does not stretch routinely.   Past Medical History:  Diagnosis Date  . Back pain   . Bone spur    left foot  . Chest pain   . Constipation   . COVID-19   . Diabetes mellitus type 2 in obese ([place]) 07/13/2013  . Diverticulosis   . Dyspnea   . Erectile dysfunction 01/15/2013  . Esophageal reflux 01/15/2013  . Fatty liver 07/04/2015  . GERD (gastroesophageal reflux disease)   . Hiatal hernia   . Hiatal hernia with gastroesophageal reflux 03/20/2014  . Hyperglycemia 07/13/2013  . Hyperplastic colon polyp   . Hypertension   . Internal hemorrhoids   . Lower extremity edema   . Mixed hyperlipidemia   . Murmur   . OSA (obstructive sleep apnea)    s/p UPPP  . Plantar fasciitis   . Pneumonia due to COVID-19 virus   . Prediabetes   . Reflux esophagitis   . Shoulder pain   . Stomach ulcer    from PCP  . Urinary hesitancy 09/30/2015  . Vitamin D deficiency      Related  testing: Retinal exam: Done Pneumovax: not done  Objective:  BP (!) 148/90 (BP Location: Left Arm, Patient Position: Sitting, Cuff Size: Large) Comment: 146/100 1st  Pulse 71   Temp (!) 97.3 F (36.3 C) (Temporal)   Resp 16   Wt 274 lb (124.3 kg)   SpO2 97%   BMI 37.16 kg/m  General:  Well developed, well nourished, in no apparent distress Skin:  Warm, no pallor or diaphoresis Head:  Normocephalic, atraumatic Eyes:  Pupils equal and round, sclera anicteric without injection  Lungs:  CTAB, no access msc use Cardio:  RRR, no bruits, no LE edema Musculoskeletal:  No current ttp; poor hamstring ROM b/l Neuro:  Sensation intact to pinprick on feet; neg straight leg b/l Psych: Age appropriate judgment and insight  Assessment:   Diabetes mellitus type 2 in obese ([place]) - Plan: Lipid panel, Hemoglobin A1c, Microalbumin / creatinine urine ratio, Comprehensive metabolic panel  Essential hypertension - Plan: triamterene-hydrochlorothiazide (MAXZIDE-25) 37.5-25 MG tablet  Hiatal hernia with gastroesophageal reflux  Chronic bilateral low back pain without sciatica  Need for prophylactic vaccination against Streptococcus pneumoniae (pneumococcus) - Plan: Pneumococcal polysaccharide vaccine 23-valent greater than or equal to 2yo subcutaneous/IM   Plan:   1. Sounds like sugars are well controlled. Cont metformin 500 mg bid. Foot exam neg today. Ck urine and above labs. Counseled on  diet and exercise. PCV23 today. Declined flu shot. 2. Cont Maxzide 37.5-25 mg/d, Norvasc 10 mg/d, Coreg 12.5 mg bid, monitor BP at home. If still elevated, he will bring it up at his f/u in 1 mo. 3. Change Prilosec to Protonix. He may need referral to GI if no improvement. Reflux precautions discussed. 4. Low back stretches/exercises. Wt loss will be helpful also. Pt if no better.  F/u in 1 mo. The patient voiced understanding and agreement to the plan.  Greater than 40 minutes were spent face to face with the  patient, reviewing his health maintenance, reviewing his lab results, reviewing his chronic medications.   [place], DO 08/12/20 1:19 PM

## 2020-08-12 NOTE — Patient Instructions (Addendum)
Give [place] 2-3 business days to get the results of your labs back.   Keep the diet clean and stay active.  I do recommend you get the COVID-19 vaccination, either Pfizer or Moderna. Please don't hesitate to reach out if you have any questions.   Let us know if you change your mind regarding the flu shot.   Healthy Eating Plan Many factors influence your heart health, including eating and exercise habits. Heart (coronary) risk increases with abnormal blood fat (lipid) levels. Heart-healthy meal planning includes limiting unhealthy fats, increasing healthy fats, and making other small dietary changes. This includes maintaining a healthy body weight to help keep lipid levels within a normal range.  WHAT IS MY PLAN?  Your health care provider recommends that you:  Drink a glass of water before meals to help with satiety.  Eat slowly.  An alternative to the water is to add Metamucil. This will help with satiety as well. It does contain calories, unlike water.  WHAT TYPES OF FAT SHOULD I CHOOSE?  Choose healthy fats more often. Choose monounsaturated and polyunsaturated fats, such as olive oil and canola oil, flaxseeds, walnuts, almonds, and seeds.  Eat more omega-3 fats. Good choices include salmon, mackerel, sardines, tuna, flaxseed oil, and ground flaxseeds. Aim to eat fish at least two times each week.  Avoid foods with partially hydrogenated oils in them. These contain trans fats. Examples of foods that contain trans fats are stick margarine, some tub margarines, cookies, crackers, and other baked goods. If you are going to avoid a fat, this is the one to avoid!  WHAT GENERAL GUIDELINES DO I NEED TO FOLLOW?  Check food labels carefully to identify foods with trans fats. Avoid these types of options when possible.  Fill one half of your plate with vegetables and green salads. Eat 4-5 servings of vegetables per day. A serving of vegetables equals 1 cup of raw leafy vegetables,  cup of raw  or cooked cut-up vegetables, or  cup of vegetable juice.  Fill one fourth of your plate with whole grains. Look for the word "whole" as the first word in the ingredient list.  Fill one fourth of your plate with lean protein foods.  Eat 4-5 servings of fruit per day. A serving of fruit equals one medium whole fruit,  cup of dried fruit,  cup of fresh, frozen, or canned fruit. Try to avoid fruits in cups/syrups as the sugar content can be high.  Eat more foods that contain soluble fiber. Examples of foods that contain this type of fiber are apples, broccoli, carrots, beans, peas, and barley. Aim to get 20-30 g of fiber per day.  Eat more home-cooked food and less restaurant, buffet, and fast food.  Limit or avoid alcohol.  Limit foods that are high in starch and sugar.  Avoid fried foods when able.  Cook foods by using methods other than frying. Baking, boiling, grilling, and broiling are all great options. Other fat-reducing suggestions include: ? Removing the skin from poultry. ? Removing all visible fats from meats. ? Skimming the fat off of stews, soups, and gravies before serving them. ? Steaming vegetables in water or broth.  Lose weight if you are overweight. Losing just 5-10% of your initial body weight can help your overall health and prevent diseases such as diabetes and heart disease.  Increase your consumption of nuts, legumes, and seeds to 4-5 servings per week. One serving of dried beans or legumes equals  cup after being cooked,  one serving of nuts equals 1 ounces, and one serving of seeds equals  ounce or 1 tablespoon.  WHAT ARE GOOD FOODS CAN I EAT? Grains Grainy breads (try to find bread that is 3 g of fiber per slice or greater), oatmeal, light popcorn. Whole-grain cereals. Rice and pasta, including brown rice and those that are made with whole wheat. Edamame pasta is a great alternative to grain pasta. It has a higher protein content. Try to avoid significant  consumption of white bread, sugary cereals, or pastries/baked goods.  Vegetables All vegetables. Cooked white potatoes do not count as vegetables.  Fruits All fruits, but limit pineapple and bananas as these fruits have a higher sugar content.  Meats and Other Protein Sources Lean, well-trimmed beef, veal, pork, and lamb. Chicken and [place] without skin. All fish and shellfish. Wild duck, rabbit, pheasant, and venison. Egg whites or low-cholesterol egg substitutes. Dried beans, peas, lentils, and tofu.Seeds and most nuts.  Dairy Low-fat or nonfat cheeses, including ricotta, string, and mozzarella. Skim or 1% milk that is liquid, powdered, or evaporated. Buttermilk that is made with low-fat milk. Nonfat or low-fat yogurt. Soy/Almond milk are good alternatives if you cannot handle dairy.  Beverages Water is the best for you. Sports drinks with less sugar are more desirable unless you are a highly active athlete.  Sweets and Desserts Sherbets and fruit ices. Honey, jam, marmalade, jelly, and syrups. Dark chocolate.  Eat all sweets and desserts in moderation.  Fats and Oils Nonhydrogenated (trans-free) margarines. Vegetable oils, including soybean, sesame, sunflower, olive, peanut, safflower, corn, canola, and cottonseed. Salad dressings or mayonnaise that are made with a vegetable oil. Limit added fats and oils that you use for cooking, baking, salads, and as spreads.  Other Cocoa powder. Coffee and tea. Most condiments.  The items listed above may not be a complete list of recommended foods or beverages. Contact your dietitian for more options.  EXERCISES  RANGE OF MOTION (ROM) AND STRETCHING EXERCISES - Low Back Pain Most people with lower back pain will find that their symptoms get worse with excessive bending forward (flexion) or arching at the lower back (extension). The exercises that will help resolve your symptoms will focus on the opposite motion.  If you have pain, numbness or  tingling which travels down into your buttocks, leg or foot, the goal of the therapy is for these symptoms to move closer to your back and eventually resolve. Sometimes, these leg symptoms will get better, but your lower back pain may worsen. This is often an indication of progress in your rehabilitation. Be very alert to any changes in your symptoms and the activities in which you participated in the 24 hours prior to the change. Sharing this information with your caregiver will allow him or her to most efficiently treat your condition. These exercises may help you when beginning to rehabilitate your injury. Your symptoms may resolve with or without further involvement from your physician, physical therapist or athletic trainer. While completing these exercises, remember:   Restoring tissue flexibility helps normal motion to return to the joints. This allows healthier, less painful movement and activity.  An effective stretch should be held for at least 30 seconds.  A stretch should never be painful. You should only feel a gentle lengthening or release in the stretched tissue. FLEXION RANGE OF MOTION AND STRETCHING EXERCISES:  STRETCH - Flexion, Single Knee to Chest   Lie on a firm bed or floor with both legs extended in front of  you.  Keeping one leg in contact with the floor, bring your opposite knee to your chest. Hold your leg in place by either grabbing behind your thigh or at your knee.  Pull until you feel a gentle stretch in your low back. Hold 30 seconds.  Slowly release your grasp and repeat the exercise with the opposite side. Repeat 2 times. Complete this exercise 3 times per week.   STRETCH - Flexion, Double Knee to Chest  Lie on a firm bed or floor with both legs extended in front of you.  Keeping one leg in contact with the floor, bring your opposite knee to your chest.  Tense your stomach muscles to support your back and then lift your other knee to your chest. Hold your  legs in place by either grabbing behind your thighs or at your knees.  Pull both knees toward your chest until you feel a gentle stretch in your low back. Hold 30 seconds.  Tense your stomach muscles and slowly return one leg at a time to the floor. Repeat 2 times. Complete this exercise 3 times per week.   STRETCH - Low Trunk Rotation  Lie on a firm bed or floor. Keeping your legs in front of you, bend your knees so they are both pointed toward the ceiling and your feet are flat on the floor.  Extend your arms out to the side. This will stabilize your upper body by keeping your shoulders in contact with the floor.  Gently and slowly drop both knees together to one side until you feel a gentle stretch in your low back. Hold for 30 seconds.  Tense your stomach muscles to support your lower back as you bring your knees back to the starting position. Repeat the exercise to the other side. Repeat 2 times. Complete this exercise at least 3 times per week.   EXTENSION RANGE OF MOTION AND FLEXIBILITY EXERCISES:  STRETCH - Extension, Prone on Elbows   Lie on your stomach on the floor, a bed will be too soft. Place your palms about shoulder width apart and at the height of your head.  Place your elbows under your shoulders. If this is too painful, stack pillows under your chest.  Allow your body to relax so that your hips drop lower and make contact more completely with the floor.  Hold this position for 30 seconds.  Slowly return to lying flat on the floor. Repeat 2 times. Complete this exercise 3 times per week.   RANGE OF MOTION - Extension, Prone Press Ups  Lie on your stomach on the floor, a bed will be too soft. Place your palms about shoulder width apart and at the height of your head.  Keeping your back as relaxed as possible, slowly straighten your elbows while keeping your hips on the floor. You may adjust the placement of your hands to maximize your comfort. As you gain motion,  your hands will come more underneath your shoulders.  Hold this position 30 seconds.  Slowly return to lying flat on the floor. Repeat 2 times. Complete this exercise 3 times per week.   RANGE OF MOTION- Quadruped, Neutral Spine   Assume a hands and knees position on a firm surface. Keep your hands under your shoulders and your knees under your hips. You may place padding under your knees for comfort.  Drop your head and point your tailbone toward the ground below you. This will round out your lower back like an angry cat. Hold  this position for 30 seconds.  Slowly lift your head and release your tail bone so that your back sags into a large arch, like an old horse.  Hold this position for 30 seconds.  Repeat this until you feel limber in your low back.  Now, find your "sweet spot." This will be the most comfortable position somewhere between the two previous positions. This is your neutral spine. Once you have found this position, tense your stomach muscles to support your low back.  Hold this position for 30 seconds. Repeat 2 times. Complete this exercise 3 times per week.   STRENGTHENING EXERCISES - Low Back Sprain These exercises may help you when beginning to rehabilitate your injury. These exercises should be done near your "sweet spot." This is the neutral, low-back arch, somewhere between fully rounded and fully arched, that is your least painful position. When performed in this safe range of motion, these exercises can be used for people who have either a flexion or extension based injury. These exercises may resolve your symptoms with or without further involvement from your physician, physical therapist or athletic trainer. While completing these exercises, remember:   Muscles can gain both the endurance and the strength needed for everyday activities through controlled exercises.  Complete these exercises as instructed by your physician, physical therapist or athletic  trainer. Increase the resistance and repetitions only as guided.  You may experience muscle soreness or fatigue, but the pain or discomfort you are trying to eliminate should never worsen during these exercises. If this pain does worsen, stop and make certain you are following the directions exactly. If the pain is still present after adjustments, discontinue the exercise until you can discuss the trouble with your caregiver.  STRENGTHENING - Deep Abdominals, Pelvic Tilt   Lie on a firm bed or floor. Keeping your legs in front of you, bend your knees so they are both pointed toward the ceiling and your feet are flat on the floor.  Tense your lower abdominal muscles to press your low back into the floor. This motion will rotate your pelvis so that your tail bone is scooping upwards rather than pointing at your feet or into the floor. With a gentle tension and even breathing, hold this position for 3 seconds. Repeat 2 times. Complete this exercise 3 times per week.   STRENGTHENING - Abdominals, Crunches   Lie on a firm bed or floor. Keeping your legs in front of you, bend your knees so they are both pointed toward the ceiling and your feet are flat on the floor. Cross your arms over your chest.  Slightly tip your chin down without bending your neck.  Tense your abdominals and slowly lift your trunk high enough to just clear your shoulder blades. Lifting higher can put excessive stress on the lower back and does not further strengthen your abdominal muscles.  Control your return to the starting position. Repeat 2 times. Complete this exercise 3 times per week.   STRENGTHENING - Quadruped, Opposite UE/LE Lift   Assume a hands and knees position on a firm surface. Keep your hands under your shoulders and your knees under your hips. You may place padding under your knees for comfort.  Find your neutral spine and gently tense your abdominal muscles so that you can maintain this position. Your  shoulders and hips should form a rectangle that is parallel with the floor and is not twisted.  Keeping your trunk steady, lift your right hand no higher than  your shoulder and then your left leg no higher than your hip. Make sure you are not holding your breath. Hold this position for 30 seconds.  Continuing to keep your abdominal muscles tense and your back steady, slowly return to your starting position. Repeat with the opposite arm and leg. Repeat 2 times. Complete this exercise 3 times per week.   STRENGTHENING - Abdominals and Quadriceps, Straight Leg Raise   Lie on a firm bed or floor with both legs extended in front of you.  Keeping one leg in contact with the floor, bend the other knee so that your foot can rest flat on the floor.  Find your neutral spine, and tense your abdominal muscles to maintain your spinal position throughout the exercise.  Slowly lift your straight leg off the floor about 6 inches for a count of 3, making sure to not hold your breath.  Still keeping your neutral spine, slowly lower your leg all the way to the floor. Repeat this exercise with each leg 2 times. Complete this exercise 3 times per week.  POSTURE AND BODY MECHANICS CONSIDERATIONS - Low Back Sprain Keeping correct posture when sitting, standing or completing your activities will reduce the stress put on different body tissues, allowing injured tissues a chance to heal and limiting painful experiences. The following are general guidelines for improved posture.  While reading these guidelines, remember:  The exercises prescribed by your provider will help you have the flexibility and strength to maintain correct postures.  The correct posture provides the best environment for your joints to work. All of your joints have less wear and tear when properly supported by a spine with good posture. This means you will experience a healthier, less painful body.  Correct posture must be practiced with all  of your activities, especially prolonged sitting and standing. Correct posture is as important when doing repetitive low-stress activities (typing) as it is when doing a single heavy-load activity (lifting).  RESTING POSITIONS Consider which positions are most painful for you when choosing a resting position. If you have pain with flexion-based activities (sitting, bending, stooping, squatting), choose a position that allows you to rest in a less flexed posture. You would want to avoid curling into a fetal position on your side. If your pain worsens with extension-based activities (prolonged standing, working overhead), avoid resting in an extended position such as sleeping on your stomach. Most people will find more comfort when they rest with their spine in a more neutral position, neither too rounded nor too arched. Lying on a non-sagging bed on your side with a pillow between your knees, or on your back with a pillow under your knees will often provide some relief. Keep in mind, being in any one position for a prolonged period of time, no matter how correct your posture, can still lead to stiffness.  PROPER SITTING POSTURE In order to minimize stress and discomfort on your spine, you must sit with correct posture. Sitting with good posture should be effortless for a healthy body. Returning to good posture is a gradual process. Many people can work toward this most comfortably by using various supports until they have the flexibility and strength to maintain this posture on their own. When sitting with proper posture, your ears will fall over your shoulders and your shoulders will fall over your hips. You should use the back of the chair to support your upper back. Your lower back will be in a neutral position, just slightly arched. You  may place a small pillow or folded towel at the base of your lower back for  support.  When working at a desk, create an environment that supports good, upright posture.  Without extra support, muscles tire, which leads to excessive strain on joints and other tissues. Keep these recommendations in mind:  CHAIR:  A chair should be able to slide under your desk when your back makes contact with the back of the chair. This allows you to work closely.  The chair's height should allow your eyes to be level with the upper part of your monitor and your hands to be slightly lower than your elbows.  BODY POSITION  Your feet should make contact with the floor. If this is not possible, use a foot rest.  Keep your ears over your shoulders. This will reduce stress on your neck and low back.  INCORRECT SITTING POSTURES  If you are feeling tired and unable to assume a healthy sitting posture, do not slouch or slump. This puts excessive strain on your back tissues, causing more damage and pain. Healthier options include:  Using more support, like a lumbar pillow.  Switching tasks to something that requires you to be upright or walking.  Talking a brief walk.  Lying down to rest in a neutral-spine position.  PROLONGED STANDING WHILE SLIGHTLY LEANING FORWARD  When completing a task that requires you to lean forward while standing in one place for a long time, place either foot up on a stationary 2-4 inch high object to help maintain the best posture. When both feet are on the ground, the lower back tends to lose its slight inward curve. If this curve flattens (or becomes too large), then the back and your other joints will experience too much stress, tire more quickly, and can cause pain.  CORRECT STANDING POSTURES Proper standing posture should be assumed with all daily activities, even if they only take a few moments, like when brushing your teeth. As in sitting, your ears should fall over your shoulders and your shoulders should fall over your hips. You should keep a slight tension in your abdominal muscles to brace your spine. Your tailbone should point down to the  ground, not behind your body, resulting in an over-extended swayback posture.   INCORRECT STANDING POSTURES  Common incorrect standing postures include a forward head, locked knees and/or an excessive swayback. WALKING Walk with an upright posture. Your ears, shoulders and hips should all line-up.  PROLONGED ACTIVITY IN A FLEXED POSITION When completing a task that requires you to bend forward at your waist or lean over a low surface, try to find a way to stabilize 3 out of 4 of your limbs. You can place a hand or elbow on your thigh or rest a knee on the surface you are reaching across. This will provide you more stability, so that your muscles do not tire as quickly. By keeping your knees relaxed, or slightly bent, you will also reduce stress across your lower back. CORRECT LIFTING TECHNIQUES  DO :  Assume a wide stance. This will provide you more stability and the opportunity to get as close as possible to the object which you are lifting.  Tense your abdominals to brace your spine. Bend at the knees and hips. Keeping your back locked in a neutral-spine position, lift using your leg muscles. Lift with your legs, keeping your back straight.  Test the weight of unknown objects before attempting to lift them.  Try to keep  your elbows locked down at your sides in order get the best strength from your shoulders when carrying an object.     Always ask for help when lifting heavy or awkward objects. INCORRECT LIFTING TECHNIQUES DO NOT:   Lock your knees when lifting, even if it is a small object.  Bend and twist. Pivot at your feet or move your feet when needing to change directions.  Assume that you can safely pick up even a paperclip without proper posture.

## 2020-08-13 LAB — HEMOGLOBIN A1C: Hgb A1c MFr Bld: 6.2 % (ref 4.6–6.5)

## 2020-08-13 LAB — COMPREHENSIVE METABOLIC PANEL
ALT: 38 U/L (ref 0–53)
AST: 27 U/L (ref 0–37)
Albumin: 4.6 g/dL (ref 3.5–5.2)
Alkaline Phosphatase: 82 U/L (ref 39–117)
BUN: 12 mg/dL (ref 6–23)
CO2: 26 mEq/L (ref 19–32)
Calcium: 9.8 mg/dL (ref 8.4–10.5)
Chloride: 103 mEq/L (ref 96–112)
Creatinine, Ser: 0.96 mg/dL (ref 0.40–1.50)
GFR: 90.69 mL/min (ref >60.00)
Glucose, Bld: 82 mg/dL (ref 70–99)
Potassium: 4.1 mEq/L (ref 3.5–5.1)
Sodium: 140 mEq/L (ref 135–145)
Total Bilirubin: 0.4 mg/dL (ref 0.2–1.2)
Total Protein: 7.6 g/dL (ref 6.0–8.3)

## 2020-08-13 LAB — LIPID PANEL
Cholesterol: 264 mg/dL — ABNORMAL HIGH (ref 0–200)
HDL: 43.8 mg/dL (ref >39.00)
NonHDL: 220.4
Total CHOL/HDL Ratio: 6
Triglycerides: 209 mg/dL — ABNORMAL HIGH (ref 0.0–149.0)
VLDL: 41.8 mg/dL — ABNORMAL HIGH (ref 0.0–40.0)

## 2020-08-13 LAB — LDL CHOLESTEROL, DIRECT: Direct LDL: 181 mg/dL

## 2020-08-16 ENCOUNTER — Other Ambulatory Visit: Payer: Self-pay

## 2020-08-16 DIAGNOSIS — E1169 Type 2 diabetes mellitus with other specified complication: Secondary | ICD-10-CM

## 2020-08-16 DIAGNOSIS — E785 Hyperlipidemia, unspecified: Secondary | ICD-10-CM

## 2020-08-16 MED ORDER — ROSUVASTATIN CALCIUM 40 MG PO TABS: 20.0000 mg | ORAL_TABLET | Freq: Every day | ORAL | 3 refills | Status: DC

## 2020-08-16 NOTE — Progress Notes (Signed)
Pt called informed of LDL and educated on importance of med adherence. Pt reports he has been taking med. Dose increased to 40mg  lab appointment created

## 2020-08-19 ENCOUNTER — Other Ambulatory Visit (INDEPENDENT_AMBULATORY_CARE_PROVIDER_SITE_OTHER): Payer: Self-pay | Admitting: Family Medicine

## 2020-08-19 DIAGNOSIS — J069 Acute upper respiratory infection, unspecified: Secondary | ICD-10-CM

## 2020-08-19 DIAGNOSIS — E1169 Type 2 diabetes mellitus with other specified complication: Secondary | ICD-10-CM

## 2020-08-19 DIAGNOSIS — N529 Male erectile dysfunction, unspecified: Secondary | ICD-10-CM

## 2020-08-20 ENCOUNTER — Telehealth: Payer: Self-pay | Admitting: Family Medicine

## 2020-08-20 NOTE — Telephone Encounter (Signed)
Patient states he needs all his prescription to be move to [place], [place] - [address]  [place], [place] [address]  Phone:  [phone] Fax:  [phone]

## 2020-08-20 NOTE — Telephone Encounter (Signed)
Left message on machine for patient to call back if he needs a particular one sent over.   There were several sent yesterday.  He can also let [facility] know what he needs and they will either call the old pharmacy or call us.  He can also call as he needs them.

## 2020-08-25 ENCOUNTER — Telehealth: Payer: Self-pay | Admitting: Podiatry

## 2020-08-25 NOTE — Telephone Encounter (Signed)
Pt requested his return to work note to be sent to his email, but never left me his email. I left a message requesting it and advised him that it would be sent right over.

## 2020-09-16 ENCOUNTER — Other Ambulatory Visit (INDEPENDENT_AMBULATORY_CARE_PROVIDER_SITE_OTHER): Payer: No Typology Code available for payment source

## 2020-09-16 ENCOUNTER — Other Ambulatory Visit: Payer: Self-pay

## 2020-09-16 DIAGNOSIS — E785 Hyperlipidemia, unspecified: Secondary | ICD-10-CM | POA: Diagnosis not present

## 2020-09-16 DIAGNOSIS — E1169 Type 2 diabetes mellitus with other specified complication: Secondary | ICD-10-CM | POA: Diagnosis not present

## 2020-09-16 LAB — LIPID PANEL
Cholesterol: 195 mg/dL (ref 0–200)
HDL: 40.6 mg/dL (ref >39.00)
LDL Cholesterol: 132 mg/dL — ABNORMAL HIGH (ref 0–99)
NonHDL: 153.98
Total CHOL/HDL Ratio: 5
Triglycerides: 111 mg/dL (ref 0.0–149.0)
VLDL: 22.2 mg/dL (ref 0.0–40.0)

## 2020-09-17 ENCOUNTER — Encounter: Payer: Self-pay | Admitting: Family Medicine

## 2020-09-17 ENCOUNTER — Ambulatory Visit (INDEPENDENT_AMBULATORY_CARE_PROVIDER_SITE_OTHER): Payer: No Typology Code available for payment source | Admitting: Family Medicine

## 2020-09-17 VITALS — BP 142/90 | HR 72 | Temp 98.3°F | Ht 72.0 in | Wt 272.1 lb

## 2020-09-17 DIAGNOSIS — M65351 Trigger finger, right little finger: Secondary | ICD-10-CM

## 2020-09-17 DIAGNOSIS — I10 Essential (primary) hypertension: Secondary | ICD-10-CM | POA: Diagnosis not present

## 2020-09-17 DIAGNOSIS — M545 Low back pain, unspecified: Secondary | ICD-10-CM

## 2020-09-17 DIAGNOSIS — G8929 Other chronic pain: Secondary | ICD-10-CM | POA: Diagnosis not present

## 2020-09-17 NOTE — Progress Notes (Signed)
Chief Complaint  Patient presents with  . Follow-up    Medication and lab work    Subjective [name] Foster is a 55 y.o. male who presents for hypertension follow up. He does monitor home blood pressures. Blood pressures ranging from 120-130's/80's on average. He is compliant with medications- Norvasc 10 mg/d, Coreg 12.5 mg bid, Maxzide 37.5-25 mg/d. Patient has these side effects of medication: none He is not adhering to a healthy diet overall. Current exercise: none No new chest pain or SOB. Worked up by GI, thought to be related.  He is following up for low back pain also. He was not compliant with his HEP. He has no change in his pain. No new s/s's such as neuro s/s's, bruising, swelling, loss of bowel/bladder control.   Several weeks of R pinky finger getting stuck, mainly at night. No inj or change in activity. No redness, bruising or swelling. Has not tried anything at home so far. R middle finger getting a little bit.    Past Medical History:  Diagnosis Date  . Back pain   . Bone spur    left foot  . Chest pain   . Constipation   . COVID-19   . Diabetes mellitus type 2 in obese ([place]) 07/13/2013  . Diverticulosis   . Dyspnea   . Erectile dysfunction 01/15/2013  . Esophageal reflux 01/15/2013  . Fatty liver 07/04/2015  . GERD (gastroesophageal reflux disease)   . Hiatal hernia   . Hiatal hernia with gastroesophageal reflux 03/20/2014  . Hyperglycemia 07/13/2013  . Hyperplastic colon polyp   . Hypertension   . Internal hemorrhoids   . Lower extremity edema   . Mixed hyperlipidemia   . Murmur   . OSA (obstructive sleep apnea)    s/p UPPP  . Plantar fasciitis   . Pneumonia due to COVID-19 virus   . Prediabetes   . Reflux esophagitis   . Shoulder pain   . Stomach ulcer    from PCP  . Urinary hesitancy 09/30/2015  . Vitamin D deficiency     Exam BP (!) 142/90 (BP Location: Left Arm, Patient Position: Sitting, Cuff Size: Large)   Pulse 72   Temp 98.3 F (36.8 C)  (Oral)   Ht 6' (1.829 m)   Wt 272 lb 2 oz (123.4 kg)   SpO2 97%   BMI 36.91 kg/m  General:  well developed, well nourished, in no apparent distress Heart: RRR, no bruits, no LE edema Lungs: clear to auscultation, no accessory muscle use MSK: No ttp over lumbar spine. There is a small nodule over the flexor tendon of the R 5th digit on the distal palmar surface. It is ttp. No fluctuance or drainage. No skin changes or excessive warmth. The pinky finger does trigger  Psych: well oriented with normal range of affect and appropriate judgment/insight  Essential hypertension  Chronic bilateral low back pain without sciatica  Trigger little finger of right hand  1. Cont Coreg 12.5 mg bid, Maxzide 37.5-25 mg/d, Norvasc 10 mg/d. Monitor BP at home. He will return for a nurse visit in 3 weeks and bring readings and monitor. Counseled on diet and exercise. 2. Encouraged compliance with HEP. PT if no better. 3. Offered injection, not bothersome enough right now.  The patient voiced understanding and agreement to the plan.  [place], DO 09/17/20  11:48 AM

## 2020-09-17 NOTE — Patient Instructions (Signed)
Send me a message in a month if you are still having back pain. We will set you up with physical therapy.  Keep the diet clean and stay active.  Continue checking your blood pressure at home. Bring your monitor to your nurse appointment.   Heat (pad or rice pillow in microwave) over affected area, 10-15 minutes twice daily.   Ice/cold pack over area for 10-15 min twice daily.  The finger issue can be aided with a shot. If it gets to the point of being bothersome enough to have this done, let us know.   Let us know if you need anything.  Healthy Eating Plan Many factors influence your heart health, including eating and exercise habits. Heart (coronary) risk increases with abnormal blood fat (lipid) levels. Heart-healthy meal planning includes limiting unhealthy fats, increasing healthy fats, and making other small dietary changes. This includes maintaining a healthy body weight to help keep lipid levels within a normal range.  WHAT IS MY PLAN?  Your health care provider recommends that you:  Drink a glass of water before meals to help with satiety.  Eat slowly.  An alternative to the water is to add Metamucil. This will help with satiety as well. It does contain calories, unlike water.  WHAT TYPES OF FAT SHOULD I CHOOSE?  Choose healthy fats more often. Choose monounsaturated and polyunsaturated fats, such as olive oil and canola oil, flaxseeds, walnuts, almonds, and seeds.  Eat more omega-3 fats. Good choices include salmon, mackerel, sardines, tuna, flaxseed oil, and ground flaxseeds. Aim to eat fish at least two times each week.  Avoid foods with partially hydrogenated oils in them. These contain trans fats. Examples of foods that contain trans fats are stick margarine, some tub margarines, cookies, crackers, and other baked goods. If you are going to avoid a fat, this is the one to avoid!  WHAT GENERAL GUIDELINES DO I NEED TO FOLLOW?  Check food labels carefully to identify  foods with trans fats. Avoid these types of options when possible.  Fill one half of your plate with vegetables and green salads. Eat 4-5 servings of vegetables per day. A serving of vegetables equals 1 cup of raw leafy vegetables,  cup of raw or cooked cut-up vegetables, or  cup of vegetable juice.  Fill one fourth of your plate with whole grains. Look for the word "whole" as the first word in the ingredient list.  Fill one fourth of your plate with lean protein foods.  Eat 4-5 servings of fruit per day. A serving of fruit equals one medium whole fruit,  cup of dried fruit,  cup of fresh, frozen, or canned fruit. Try to avoid fruits in cups/syrups as the sugar content can be high.  Eat more foods that contain soluble fiber. Examples of foods that contain this type of fiber are apples, broccoli, carrots, beans, peas, and barley. Aim to get 20-30 g of fiber per day.  Eat more home-cooked food and less restaurant, buffet, and fast food.  Limit or avoid alcohol.  Limit foods that are high in starch and sugar.  Avoid fried foods when able.  Cook foods by using methods other than frying. Baking, boiling, grilling, and broiling are all great options. Other fat-reducing suggestions include: ? Removing the skin from poultry. ? Removing all visible fats from meats. ? Skimming the fat off of stews, soups, and gravies before serving them. ? Steaming vegetables in water or broth.  Lose weight if you are overweight. Losing just  5-10% of your initial body weight can help your overall health and prevent diseases such as diabetes and heart disease.  Increase your consumption of nuts, legumes, and seeds to 4-5 servings per week. One serving of dried beans or legumes equals  cup after being cooked, one serving of nuts equals 1 ounces, and one serving of seeds equals  ounce or 1 tablespoon.  WHAT ARE GOOD FOODS CAN I EAT? Grains Grainy breads (try to find bread that is 3 g of fiber per slice or  greater), oatmeal, light popcorn. Whole-grain cereals. Rice and pasta, including brown rice and those that are made with whole wheat. Edamame pasta is a great alternative to grain pasta. It has a higher protein content. Try to avoid significant consumption of white bread, sugary cereals, or pastries/baked goods.  Vegetables All vegetables. Cooked white potatoes do not count as vegetables.  Fruits All fruits, but limit pineapple and bananas as these fruits have a higher sugar content.  Meats and Other Protein Sources Lean, well-trimmed beef, veal, pork, and lamb. Chicken and [place] without skin. All fish and shellfish. Wild duck, rabbit, pheasant, and venison. Egg whites or low-cholesterol egg substitutes. Dried beans, peas, lentils, and tofu.Seeds and most nuts.  Dairy Low-fat or nonfat cheeses, including ricotta, string, and mozzarella. Skim or 1% milk that is liquid, powdered, or evaporated. Buttermilk that is made with low-fat milk. Nonfat or low-fat yogurt. Soy/Almond milk are good alternatives if you cannot handle dairy.  Beverages Water is the best for you. Sports drinks with less sugar are more desirable unless you are a highly active athlete.  Sweets and Desserts Sherbets and fruit ices. Honey, jam, marmalade, jelly, and syrups. Dark chocolate.  Eat all sweets and desserts in moderation.  Fats and Oils Nonhydrogenated (trans-free) margarines. Vegetable oils, including soybean, sesame, sunflower, olive, peanut, safflower, corn, canola, and cottonseed. Salad dressings or mayonnaise that are made with a vegetable oil. Limit added fats and oils that you use for cooking, baking, salads, and as spreads.  Other Cocoa powder. Coffee and tea. Most condiments.  The items listed above may not be a complete list of recommended foods or beverages. Contact your dietitian for more options.  EXERCISES  RANGE OF MOTION (ROM) AND STRETCHING EXERCISES - Low Back Pain Most people with lower back  pain will find that their symptoms get worse with excessive bending forward (flexion) or arching at the lower back (extension). The exercises that will help resolve your symptoms will focus on the opposite motion.  If you have pain, numbness or tingling which travels down into your buttocks, leg or foot, the goal of the therapy is for these symptoms to move closer to your back and eventually resolve. Sometimes, these leg symptoms will get better, but your lower back pain may worsen. This is often an indication of progress in your rehabilitation. Be very alert to any changes in your symptoms and the activities in which you participated in the 24 hours prior to the change. Sharing this information with your caregiver will allow him or her to most efficiently treat your condition. These exercises may help you when beginning to rehabilitate your injury. Your symptoms may resolve with or without further involvement from your physician, physical therapist or athletic trainer. While completing these exercises, remember:   Restoring tissue flexibility helps normal motion to return to the joints. This allows healthier, less painful movement and activity.  An effective stretch should be held for at least 30 seconds.  A stretch should  never be painful. You should only feel a gentle lengthening or release in the stretched tissue. FLEXION RANGE OF MOTION AND STRETCHING EXERCISES:  STRETCH - Flexion, Single Knee to Chest   Lie on a firm bed or floor with both legs extended in front of you.  Keeping one leg in contact with the floor, bring your opposite knee to your chest. Hold your leg in place by either grabbing behind your thigh or at your knee.  Pull until you feel a gentle stretch in your low back. Hold 30 seconds.  Slowly release your grasp and repeat the exercise with the opposite side. Repeat 2 times. Complete this exercise 3 times per week.   STRETCH - Flexion, Double Knee to Chest  Lie on a firm  bed or floor with both legs extended in front of you.  Keeping one leg in contact with the floor, bring your opposite knee to your chest.  Tense your stomach muscles to support your back and then lift your other knee to your chest. Hold your legs in place by either grabbing behind your thighs or at your knees.  Pull both knees toward your chest until you feel a gentle stretch in your low back. Hold 30 seconds.  Tense your stomach muscles and slowly return one leg at a time to the floor. Repeat 2 times. Complete this exercise 3 times per week.   STRETCH - Low Trunk Rotation  Lie on a firm bed or floor. Keeping your legs in front of you, bend your knees so they are both pointed toward the ceiling and your feet are flat on the floor.  Extend your arms out to the side. This will stabilize your upper body by keeping your shoulders in contact with the floor.  Gently and slowly drop both knees together to one side until you feel a gentle stretch in your low back. Hold for 30 seconds.  Tense your stomach muscles to support your lower back as you bring your knees back to the starting position. Repeat the exercise to the other side. Repeat 2 times. Complete this exercise at least 3 times per week.   EXTENSION RANGE OF MOTION AND FLEXIBILITY EXERCISES:  STRETCH - Extension, Prone on Elbows   Lie on your stomach on the floor, a bed will be too soft. Place your palms about shoulder width apart and at the height of your head.  Place your elbows under your shoulders. If this is too painful, stack pillows under your chest.  Allow your body to relax so that your hips drop lower and make contact more completely with the floor.  Hold this position for 30 seconds.  Slowly return to lying flat on the floor. Repeat 2 times. Complete this exercise 3 times per week.   RANGE OF MOTION - Extension, Prone Press Ups  Lie on your stomach on the floor, a bed will be too soft. Place your palms about shoulder  width apart and at the height of your head.  Keeping your back as relaxed as possible, slowly straighten your elbows while keeping your hips on the floor. You may adjust the placement of your hands to maximize your comfort. As you gain motion, your hands will come more underneath your shoulders.  Hold this position 30 seconds.  Slowly return to lying flat on the floor. Repeat 2 times. Complete this exercise 3 times per week.   RANGE OF MOTION- Quadruped, Neutral Spine   Assume a hands and knees position on a firm  surface. Keep your hands under your shoulders and your knees under your hips. You may place padding under your knees for comfort.  Drop your head and point your tailbone toward the ground below you. This will round out your lower back like an angry cat. Hold this position for 30 seconds.  Slowly lift your head and release your tail bone so that your back sags into a large arch, like an old horse.  Hold this position for 30 seconds.  Repeat this until you feel limber in your low back.  Now, find your "sweet spot." This will be the most comfortable position somewhere between the two previous positions. This is your neutral spine. Once you have found this position, tense your stomach muscles to support your low back.  Hold this position for 30 seconds. Repeat 2 times. Complete this exercise 3 times per week.   STRENGTHENING EXERCISES - Low Back Sprain These exercises may help you when beginning to rehabilitate your injury. These exercises should be done near your "sweet spot." This is the neutral, low-back arch, somewhere between fully rounded and fully arched, that is your least painful position. When performed in this safe range of motion, these exercises can be used for people who have either a flexion or extension based injury. These exercises may resolve your symptoms with or without further involvement from your physician, physical therapist or athletic trainer. While  completing these exercises, remember:   Muscles can gain both the endurance and the strength needed for everyday activities through controlled exercises.  Complete these exercises as instructed by your physician, physical therapist or athletic trainer. Increase the resistance and repetitions only as guided.  You may experience muscle soreness or fatigue, but the pain or discomfort you are trying to eliminate should never worsen during these exercises. If this pain does worsen, stop and make certain you are following the directions exactly. If the pain is still present after adjustments, discontinue the exercise until you can discuss the trouble with your caregiver.  STRENGTHENING - Deep Abdominals, Pelvic Tilt   Lie on a firm bed or floor. Keeping your legs in front of you, bend your knees so they are both pointed toward the ceiling and your feet are flat on the floor.  Tense your lower abdominal muscles to press your low back into the floor. This motion will rotate your pelvis so that your tail bone is scooping upwards rather than pointing at your feet or into the floor. With a gentle tension and even breathing, hold this position for 3 seconds. Repeat 2 times. Complete this exercise 3 times per week.   STRENGTHENING - Abdominals, Crunches   Lie on a firm bed or floor. Keeping your legs in front of you, bend your knees so they are both pointed toward the ceiling and your feet are flat on the floor. Cross your arms over your chest.  Slightly tip your chin down without bending your neck.  Tense your abdominals and slowly lift your trunk high enough to just clear your shoulder blades. Lifting higher can put excessive stress on the lower back and does not further strengthen your abdominal muscles.  Control your return to the starting position. Repeat 2 times. Complete this exercise 3 times per week.   STRENGTHENING - Quadruped, Opposite UE/LE Lift   Assume a hands and knees position on a  firm surface. Keep your hands under your shoulders and your knees under your hips. You may place padding under your knees for comfort.  Find your neutral spine and gently tense your abdominal muscles so that you can maintain this position. Your shoulders and hips should form a rectangle that is parallel with the floor and is not twisted.  Keeping your trunk steady, lift your right hand no higher than your shoulder and then your left leg no higher than your hip. Make sure you are not holding your breath. Hold this position for 30 seconds.  Continuing to keep your abdominal muscles tense and your back steady, slowly return to your starting position. Repeat with the opposite arm and leg. Repeat 2 times. Complete this exercise 3 times per week.   STRENGTHENING - Abdominals and Quadriceps, Straight Leg Raise   Lie on a firm bed or floor with both legs extended in front of you.  Keeping one leg in contact with the floor, bend the other knee so that your foot can rest flat on the floor.  Find your neutral spine, and tense your abdominal muscles to maintain your spinal position throughout the exercise.  Slowly lift your straight leg off the floor about 6 inches for a count of 3, making sure to not hold your breath.  Still keeping your neutral spine, slowly lower your leg all the way to the floor. Repeat this exercise with each leg 2 times. Complete this exercise 3 times per week.  POSTURE AND BODY MECHANICS CONSIDERATIONS - Low Back Sprain Keeping correct posture when sitting, standing or completing your activities will reduce the stress put on different body tissues, allowing injured tissues a chance to heal and limiting painful experiences. The following are general guidelines for improved posture.  While reading these guidelines, remember:  The exercises prescribed by your provider will help you have the flexibility and strength to maintain correct postures.  The correct posture provides the  best environment for your joints to work. All of your joints have less wear and tear when properly supported by a spine with good posture. This means you will experience a healthier, less painful body.  Correct posture must be practiced with all of your activities, especially prolonged sitting and standing. Correct posture is as important when doing repetitive low-stress activities (typing) as it is when doing a single heavy-load activity (lifting).  RESTING POSITIONS Consider which positions are most painful for you when choosing a resting position. If you have pain with flexion-based activities (sitting, bending, stooping, squatting), choose a position that allows you to rest in a less flexed posture. You would want to avoid curling into a fetal position on your side. If your pain worsens with extension-based activities (prolonged standing, working overhead), avoid resting in an extended position such as sleeping on your stomach. Most people will find more comfort when they rest with their spine in a more neutral position, neither too rounded nor too arched. Lying on a non-sagging bed on your side with a pillow between your knees, or on your back with a pillow under your knees will often provide some relief. Keep in mind, being in any one position for a prolonged period of time, no matter how correct your posture, can still lead to stiffness.  PROPER SITTING POSTURE In order to minimize stress and discomfort on your spine, you must sit with correct posture. Sitting with good posture should be effortless for a healthy body. Returning to good posture is a gradual process. Many people can work toward this most comfortably by using various supports until they have the flexibility and strength to maintain this posture on their  own. When sitting with proper posture, your ears will fall over your shoulders and your shoulders will fall over your hips. You should use the back of the chair to support your upper  back. Your lower back will be in a neutral position, just slightly arched. You may place a small pillow or folded towel at the base of your lower back for  support.  When working at a desk, create an environment that supports good, upright posture. Without extra support, muscles tire, which leads to excessive strain on joints and other tissues. Keep these recommendations in mind:  CHAIR:  A chair should be able to slide under your desk when your back makes contact with the back of the chair. This allows you to work closely.  The chair's height should allow your eyes to be level with the upper part of your monitor and your hands to be slightly lower than your elbows.  BODY POSITION  Your feet should make contact with the floor. If this is not possible, use a foot rest.  Keep your ears over your shoulders. This will reduce stress on your neck and low back.  INCORRECT SITTING POSTURES  If you are feeling tired and unable to assume a healthy sitting posture, do not slouch or slump. This puts excessive strain on your back tissues, causing more damage and pain. Healthier options include:  Using more support, like a lumbar pillow.  Switching tasks to something that requires you to be upright or walking.  Talking a brief walk.  Lying down to rest in a neutral-spine position.  PROLONGED STANDING WHILE SLIGHTLY LEANING FORWARD  When completing a task that requires you to lean forward while standing in one place for a long time, place either foot up on a stationary 2-4 inch high object to help maintain the best posture. When both feet are on the ground, the lower back tends to lose its slight inward curve. If this curve flattens (or becomes too large), then the back and your other joints will experience too much stress, tire more quickly, and can cause pain.  CORRECT STANDING POSTURES Proper standing posture should be assumed with all daily activities, even if they only take a few moments, like  when brushing your teeth. As in sitting, your ears should fall over your shoulders and your shoulders should fall over your hips. You should keep a slight tension in your abdominal muscles to brace your spine. Your tailbone should point down to the ground, not behind your body, resulting in an over-extended swayback posture.   INCORRECT STANDING POSTURES  Common incorrect standing postures include a forward head, locked knees and/or an excessive swayback. WALKING Walk with an upright posture. Your ears, shoulders and hips should all line-up.  PROLONGED ACTIVITY IN A FLEXED POSITION When completing a task that requires you to bend forward at your waist or lean over a low surface, try to find a way to stabilize 3 out of 4 of your limbs. You can place a hand or elbow on your thigh or rest a knee on the surface you are reaching across. This will provide you more stability, so that your muscles do not tire as quickly. By keeping your knees relaxed, or slightly bent, you will also reduce stress across your lower back. CORRECT LIFTING TECHNIQUES  DO :  Assume a wide stance. This will provide you more stability and the opportunity to get as close as possible to the object which you are lifting.  Tense your abdominals  to brace your spine. Bend at the knees and hips. Keeping your back locked in a neutral-spine position, lift using your leg muscles. Lift with your legs, keeping your back straight.  Test the weight of unknown objects before attempting to lift them.  Try to keep your elbows locked down at your sides in order get the best strength from your shoulders when carrying an object.     Always ask for help when lifting heavy or awkward objects. INCORRECT LIFTING TECHNIQUES DO NOT:   Lock your knees when lifting, even if it is a small object.  Bend and twist. Pivot at your feet or move your feet when needing to change directions.  Assume that you can safely pick up even a paperclip without  proper posture.

## 2020-09-20 ENCOUNTER — Other Ambulatory Visit: Payer: No Typology Code available for payment source

## 2020-09-21 ENCOUNTER — Telehealth: Payer: Self-pay | Admitting: Pulmonary Disease

## 2020-09-21 NOTE — Telephone Encounter (Signed)
ATC patient unable to reach No voicemail  (x1)

## 2020-09-21 NOTE — Telephone Encounter (Signed)
09/21/20  Contacted patient.  Last seen by Dr. Ander Slade back in August/2021.  Patient needs pulmonary function testing scheduled.  Patient reports that he had active dyspnea on exertion as well as active chest discomfort/chest pain for the last 5 years.  He is also status post COVID infection in 2020.  Patient reports that symptoms that he is currently having have been stable and are similar from last office visit.  He is due for follow-up.  Plan: Patient scheduled for follow-up with Dr. Jenetta Downer on 10/14/2020 at 12pm  Patient knows to seek emergent evaluation if symptoms worsen We will route to front office pool/LR/MH to contact patient to schedule for pulmonary function testing

## 2020-09-21 NOTE — Telephone Encounter (Signed)
Called and spoke with patient to get him scheduled for PFT. He is now scheduled for Covid testing on Saturday 1/15 and his PFT has been scheduled for 1/18. Nothing further needed at this time.

## 2020-09-25 ENCOUNTER — Other Ambulatory Visit (HOSPITAL_COMMUNITY)
Admission: RE | Admit: 2020-09-25 | Discharge: 2020-09-25 | Disposition: A | Discharge status: Home / Self Care | Payer: No Typology Code available for payment source | Source: Ambulatory Visit | Attending: Pulmonary Disease | Admitting: Pulmonary Disease

## 2020-09-25 DIAGNOSIS — Z01812 Encounter for preprocedural laboratory examination: Secondary | ICD-10-CM | POA: Insufficient documentation

## 2020-09-25 DIAGNOSIS — Z20822 Contact with and (suspected) exposure to covid-19: Secondary | ICD-10-CM | POA: Insufficient documentation

## 2020-09-25 LAB — SARS CORONAVIRUS 2 (TAT 6-24 HRS): SARS Coronavirus 2: NEGATIVE

## 2020-09-28 ENCOUNTER — Other Ambulatory Visit: Payer: Self-pay

## 2020-09-28 ENCOUNTER — Ambulatory Visit (INDEPENDENT_AMBULATORY_CARE_PROVIDER_SITE_OTHER): Payer: No Typology Code available for payment source | Admitting: Pulmonary Disease

## 2020-09-28 DIAGNOSIS — R0600 Dyspnea, unspecified: Secondary | ICD-10-CM

## 2020-09-28 LAB — PULMONARY FUNCTION TEST
DL/VA % pred: 132 %
DL/VA: 5.76 ml/min/mmHg/L
DLCO cor % pred: 93 %
DLCO cor: 28.74 ml/min/mmHg
DLCO unc % pred: 93 %
DLCO unc: 28.74 ml/min/mmHg
FEF 25-75 Post: 4.24 L/sec
FEF 25-75 Pre: 3.83 L/sec
FEF2575-%Change-Post: 10 %
FEF2575-%Pred-Post: 123 %
FEF2575-%Pred-Pre: 111 %
FEV1-%Change-Post: 0 %
FEV1-%Pred-Post: 85 %
FEV1-%Pred-Pre: 85 %
FEV1-Post: 3.04 L
FEV1-Pre: 3.05 L
FEV1FVC-%Change-Post: 3 %
FEV1FVC-%Pred-Pre: 107 %
FEV6-%Change-Post: -4 %
FEV6-%Pred-Post: 78 %
FEV6-%Pred-Pre: 81 %
FEV6-Post: 3.41 L
FEV6-Pre: 3.55 L
FEV6FVC-%Pred-Post: 102 %
FEV6FVC-%Pred-Pre: 102 %
FVC-%Change-Post: -3 %
FVC-%Pred-Post: 76 %
FVC-%Pred-Pre: 79 %
FVC-Post: 3.41 L
FVC-Pre: 3.55 L
Post FEV1/FVC ratio: 89 %
Post FEV6/FVC ratio: 100 %
Pre FEV1/FVC ratio: 86 %
Pre FEV6/FVC Ratio: 100 %
RV % pred: 61 %
RV: 1.34 L
TLC % pred: 66 %
TLC: 4.94 L

## 2020-09-28 NOTE — Progress Notes (Signed)
PFT done today. 

## 2020-10-14 ENCOUNTER — Other Ambulatory Visit: Payer: Self-pay

## 2020-10-14 ENCOUNTER — Ambulatory Visit (INDEPENDENT_AMBULATORY_CARE_PROVIDER_SITE_OTHER): Payer: No Typology Code available for payment source | Admitting: Pulmonary Disease

## 2020-10-14 ENCOUNTER — Encounter: Payer: Self-pay | Admitting: Pulmonary Disease

## 2020-10-14 VITALS — BP 148/84 | HR 78 | Temp 97.5°F | Ht 72.0 in | Wt 271.4 lb

## 2020-10-14 DIAGNOSIS — J849 Interstitial pulmonary disease, unspecified: Secondary | ICD-10-CM

## 2020-10-14 DIAGNOSIS — G4730 Sleep apnea, unspecified: Secondary | ICD-10-CM

## 2020-10-14 DIAGNOSIS — Z8616 Personal history of COVID-19: Secondary | ICD-10-CM

## 2020-10-14 DIAGNOSIS — R942 Abnormal results of pulmonary function studies: Secondary | ICD-10-CM

## 2020-10-14 MED ORDER — ALBUTEROL SULFATE HFA 108 (90 BASE) MCG/ACT IN AERS: 2.0000 | INHALATION_SPRAY | RESPIRATORY_TRACT | 1 refills | Status: DC | PRN

## 2020-10-14 NOTE — Progress Notes (Signed)
[name] Foster    [id number]    September 21, 1965  Primary Care Physician:[name], [name], MD  Referring Physician: [name], MD [place],   [id number]  Chief complaint:   Patient is being seen for shortness of breath on exertion Shortness of breath is not better since his last visit  HPI:  Had a recent PFT showing moderate restrictive disease, no significant obstruction, no significant bronchodilator response Did have Covid November December 2020 and felt it never really got back to his usual  Weight has been stable recently but prior to recently did gain about 30 to 40 pounds  History of hypertension, diastolic dysfunction-recent echo is stable  Continues to have significant shortness of breath on exertion  He has a history of obstructive sleep apnea, has not been using CPAP recently  -He still has the machine that is with him to go back to using CPAP  Did have Covid November/December last year for which she was hospitalized for about 5 days  He gets short of breath with moderate exertion and this happens regularly  He does have occasional chest discomfort which he stated started around the time when he had Covid and just never really went away  Never smoker Does not use alcohol  Outpatient Encounter Medications as of 10/14/2020  Medication Sig  . acetaminophen (TYLENOL) 500 MG tablet Take 1 tablet (500 mg total) by mouth every 6 (six) hours as needed.  [name] albuterol (VENTOLIN HFA) 108 (90 Base) MCG/ACT inhaler INHALE 1 TO 2 PUFFS BY MOUTH EVERY 6 HOURS AS NEEDED FOR WHEEZING OR  SHORTNESS  OF  BREATH  . amLODipine (NORVASC) 10 MG tablet Take 1 tablet (10 mg total) by mouth daily.  [name] ascorbic acid (VITAMIN C) 500 MG tablet Take 500 mg by mouth daily.  [name] aspirin EC 81 MG tablet Take 1 tablet (81 mg total) by mouth 2 (two) times daily.  Pleas Patricia Microlet Lancets lancets Test blood sugars twice daily  . Blood Glucose Monitoring Suppl  (ACCU-CHEK GUIDE ME) w/Device KIT   . Blood Glucose Monitoring Suppl (CONTOUR NEXT MONITOR) w/Device KIT 1 kit by Does not apply route 2 (two) times daily.  [name] buPROPion (WELLBUTRIN SR) 150 MG 12 hr tablet Take 1 tablet by mouth once daily  . carvedilol (COREG) 12.5 MG tablet Take 1 tablet (12.5 mg total) by mouth 2 (two) times daily with a meal.  . Cholecalciferol (VITAMIN D) 50 MCG (2000 UT) CAPS Take 1 capsule (2,000 Units total) by mouth daily.  . famotidine (PEPCID) 20 MG tablet Take 1 tablet (20 mg total) by mouth 2 (two) times daily.  . fluticasone (FLONASE) 50 MCG/ACT nasal spray Use 2 spray(s) in each nostril once daily  . glucose blood (CONTOUR NEXT TEST) test strip Test blood sugars twice daily  . ibuprofen (ADVIL) 600 MG tablet Take 1 tablet (600 mg total) by mouth every 8 (eight) hours as needed.  . melatonin 5 MG TABS Take 5 mg by mouth at bedtime.  . metFORMIN (GLUCOPHAGE) 500 MG tablet TAKE 1 TABLET BY MOUTH TWICE DAILY WITH A MEAL  . Multiple Vitamins-Minerals (MULTIVITAMIN WITH MINERALS) tablet Take 1 tablet by mouth daily.  . pantoprazole (PROTONIX) 40 MG tablet Take 1 tablet (40 mg total) by mouth daily.  . rosuvastatin (CRESTOR) 40 MG tablet Take 0.5 tablets (20 mg total) by mouth daily.  . tadalafil (CIALIS) 5 MG tablet TAKE 1  TABLET BY MOUTH ONCE DAILY AS NEEDED FOR ERECTILE DYSFUNCTION  . tamsulosin (FLOMAX) 0.4 MG CAPS capsule Take 1 capsule by mouth once daily  . tiZANidine (ZANAFLEX) 4 MG tablet Take 0.5-1 tablets (2-4 mg total) by mouth 2 (two) times daily as needed for muscle spasms.  [name] triamterene-hydrochlorothiazide (MAXZIDE-25) 37.5-25 MG tablet Take 1 tablet by mouth daily.  . Vitamin D, Ergocalciferol, (DRISDOL) 1.25 MG (50000 UNIT) CAPS capsule TAKE 1 CAPSULE BY MOUTH ONE TIME PER WEEK  . zinc gluconate 50 MG tablet Take 50 mg by mouth daily.  . Misc. Devices (PULSE OXIMETER FOR FINGER) MISC 1 Device by Does not apply route as needed (SOB, fatigue, headache  patient with COVID). (Patient not taking: Reported on 10/14/2020)  . [DISCONTINUED] amitriptyline (ELAVIL) 25 MG tablet Take 1 tablet (25 mg total) by mouth at bedtime. (Patient not taking: Reported on 07/14/2019)   No facility-administered encounter medications on file as of 10/14/2020.    Allergies as of 10/14/2020 - Review Complete 10/14/2020  Allergen Reaction Noted  . Hydrocodone Itching 01/31/2016  . Tramadol Itching 08/29/2018    Past Medical History:  Diagnosis Date  . Back pain   . Bone spur    left foot  . Chest pain   . Constipation   . COVID-19   . Diabetes mellitus type 2 in obese ([place]) 07/13/2013  . Diverticulosis   . Dyspnea   . Erectile dysfunction 01/15/2013  . Esophageal reflux 01/15/2013  . Fatty liver 07/04/2015  . GERD (gastroesophageal reflux disease)   . Hiatal hernia   . Hiatal hernia with gastroesophageal reflux 03/20/2014  . Hyperglycemia 07/13/2013  . Hyperplastic colon polyp   . Hypertension   . Internal hemorrhoids   . Lower extremity edema   . Mixed hyperlipidemia   . Murmur   . OSA (obstructive sleep apnea)    s/p UPPP  . Plantar fasciitis   . Pneumonia due to COVID-19 virus   . Prediabetes   . Reflux esophagitis   . Shoulder pain   . Stomach ulcer    from PCP  . Urinary hesitancy 09/30/2015  . Vitamin D deficiency     Past Surgical History:  Procedure Laterality Date  . [place] STUDY N/A 02/28/2016   Procedure: [place] STUDY;  Surgeon: Jerene Bears, MD;  Location: [facility];  Service: Gastroenterology;  Laterality: N/A;  . ESOPHAGEAL MANOMETRY N/A 09/01/2013   Procedure: ESOPHAGEAL MANOMETRY (EM);  Surgeon: Sable Feil, MD;  Location: [facility];  Service: Endoscopy;  Laterality: N/A;  . ESOPHAGEAL MANOMETRY N/A 02/28/2016   Procedure: ESOPHAGEAL MANOMETRY (EM);  Surgeon: Jerene Bears, MD;  Location: [facility];  Service: Gastroenterology;  Laterality: N/A;  . FOOT TENDON TRANSFER Right   . INGUINAL HERNIA REPAIR    .  TONSILLECTOMY    . UMBILICAL HERNIA REPAIR    . UVULECTOMY      Family History  Problem Relation Age of Onset  . Dementia Mother   . Diabetes Mother   . Hypertension Mother   . Heart failure Mother   . Stroke Mother   . Obesity Mother   . Hyperlipidemia Mother   . Heart disease Mother   . Depression Mother   . Hypertension Other        siblings  . Diabetes Sister   . Cancer Brother        lung cancer smoker  . Heart attack Maternal Grandmother   . Diabetes Sister   .  Pancreatic disease Sister   . Obesity Father   . Alcoholism Father   . Colon cancer Neg Hx     Social History   Socioeconomic History  . Marital status: Married    Spouse name: [name]  . Number of children: Not on file  . Years of education: Not on file  . Highest education level: Not on file  Occupational History  . Occupation: Nature conservation officer: [employer]  Tobacco Use  . Smoking status: Never Smoker  . Smokeless tobacco: Never Used  Vaping Use  . Vaping Use: Never used  Substance and Sexual Activity  . Alcohol use: No  . Drug use: No  . Sexual activity: Yes    Partners: Female  Other Topics Concern  . Not on file  Social History Narrative   Tobacco Use - No.    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  HENT: Negative.   Respiratory: Positive for apnea and shortness of breath.    Cardiovascular: Positive for chest pain.  Genitourinary: Negative.   Musculoskeletal: Negative.   Psychiatric/Behavioral: Positive for sleep disturbance.    Vitals:   10/14/20 1208  BP: (!) 148/84  Pulse: 78  Temp: (!) 97.5 F (36.4 C)  SpO2: 99%     Physical Exam Constitutional:      Appearance: He is obese.  HENT:     Head: Normocephalic.     Mouth/Throat:     Comments: Crowded oropharynx Mallampati 3 Eyes:     Pupils: Pupils are equal, round, and reactive to light.  Cardiovascular:     Rate and Rhythm: Normal rate and regular rhythm.     Pulses: Normal pulses.     Heart sounds: Normal heart sounds. No murmur heard. No friction rub.  Pulmonary:     Effort: Pulmonary effort is normal. No respiratory distress.     Breath sounds: No stridor. No wheezing or rhonchi.  Musculoskeletal:        General: Normal range of motion.     Cervical back: No rigidity or tenderness.  Skin:    General: Skin is warm.  Neurological:     General: No focal deficit present.     Mental Status: He is alert.  Psychiatric:        Mood and Affect: Mood normal.    Data Reviewed: Moderate obstructive sleep apnea on previous study  Echocardiogram reviewed showing diastolic dysfunction  Spirometry from 01/17/2016 reviewed showing normal spirometry Most recent PFT significant for restrictive disease  Assessment:   Shortness of breath with exertion Lung pain -Limitations in activities of daily living with respect to shortness of breath -Occasional wheezing  Restrictive lung disease on PFT  History of obstructive sleep  apnea -Patient stopped using CPAP following weight loss but has gained the weight back -He does have daytime sleepiness, nonrestorative sleep  Obesity  Plan/Recommendations: Encourage graded exercises  Albuterol to be used as needed, may be used prior to activity  Aggressive weight loss efforts  Pathophysiology of sleep disordered breathing discussed with the  patient Options of treatment discussed -We will contact [facility] to see whether they can get CPAP supplies -Schedule patient for home sleep study  With his history of Covid and restrictive findings on PFT-we will obtain a CT scan of the chest   I will see him back in the office in about 3 months  Encouraged to call with any significant concerns  Sherrilyn Rist MD [facility] 10/14/2020, 12:21 PM  CC: [name], MD

## 2020-10-14 NOTE — Patient Instructions (Signed)
Shortness of breath  Your breathing study is relatively normal Low lung volumes which may be weight related, because of your recent history of Covid we will go ahead and get a CT scan of the chest to make sure you do not have scarring in the lungs  Prescription for albuterol to be used for shortness of breath and wheezing  Home sleep study for obstructive sleep apnea   We will contact [facility] to see whether they can get you supplies for your current machine  Graded exercises with strong focus on weight loss   I will see you back in about 3 to 4 months  Call with significant concerns

## 2020-10-22 ENCOUNTER — Other Ambulatory Visit: Payer: Self-pay

## 2020-10-22 ENCOUNTER — Ambulatory Visit (HOSPITAL_BASED_OUTPATIENT_CLINIC_OR_DEPARTMENT_OTHER)
Admission: RE | Admit: 2020-10-22 | Discharge: 2020-10-22 | Disposition: A | Discharge status: Home / Self Care | Payer: No Typology Code available for payment source | Source: Ambulatory Visit | Attending: Pulmonary Disease | Admitting: Pulmonary Disease

## 2020-10-22 DIAGNOSIS — N62 Hypertrophy of breast: Secondary | ICD-10-CM | POA: Diagnosis not present

## 2020-10-22 DIAGNOSIS — Z8616 Personal history of COVID-19: Secondary | ICD-10-CM | POA: Diagnosis not present

## 2020-10-22 DIAGNOSIS — R918 Other nonspecific abnormal finding of lung field: Secondary | ICD-10-CM | POA: Insufficient documentation

## 2020-10-22 DIAGNOSIS — J849 Interstitial pulmonary disease, unspecified: Secondary | ICD-10-CM | POA: Insufficient documentation

## 2020-10-22 DIAGNOSIS — R942 Abnormal results of pulmonary function studies: Secondary | ICD-10-CM | POA: Diagnosis not present

## 2020-10-28 ENCOUNTER — Encounter: Payer: Self-pay | Admitting: *Deleted

## 2020-11-01 ENCOUNTER — Ambulatory Visit: Payer: No Typology Code available for payment source | Admitting: Family Medicine

## 2020-11-01 ENCOUNTER — Other Ambulatory Visit: Payer: Self-pay

## 2020-11-01 ENCOUNTER — Ambulatory Visit (INDEPENDENT_AMBULATORY_CARE_PROVIDER_SITE_OTHER): Payer: No Typology Code available for payment source | Admitting: Family Medicine

## 2020-11-01 ENCOUNTER — Encounter: Payer: Self-pay | Admitting: Family Medicine

## 2020-11-01 VITALS — BP 150/100 | HR 71 | Temp 98.2°F | Ht 72.0 in | Wt 276.5 lb

## 2020-11-01 DIAGNOSIS — R030 Elevated blood-pressure reading, without diagnosis of hypertension: Secondary | ICD-10-CM

## 2020-11-01 DIAGNOSIS — M7022 Olecranon bursitis, left elbow: Secondary | ICD-10-CM | POA: Diagnosis not present

## 2020-11-01 MED ORDER — PREDNISONE 20 MG PO TABS: 40.0000 mg | ORAL_TABLET | Freq: Every day | ORAL | 0 refills | Status: AC

## 2020-11-01 NOTE — Progress Notes (Signed)
Musculoskeletal Exam  Patient: [name] Foster DOB: [birth year]  DOS: 11/01/2020  SUBJECTIVE:  Chief Complaint:   Chief Complaint  Patient presents with  . Elbow Pain    Left elbow pain     [name] Foster is a 55 y.o.  male for evaluation and treatment of L elbow pain.   Onset:  1 month ago. No inj or change in activity.  Location: Point of L elbow Character:  aching  Progression of issue:  has worsened Associated symptoms: some swelling; no loss of ROM, redness, bruising Treatment: to date has been ice, acetaminophen and [place].   Neurovascular symptoms: no  Past Medical History:  Diagnosis Date  . Back pain   . Bone spur    left foot  . Chest pain   . Constipation   . COVID-19   . Diabetes mellitus type 2 in obese ([place]) 07/13/2013  . Diverticulosis   . Dyspnea   . Erectile dysfunction 01/15/2013  . Esophageal reflux 01/15/2013  . Fatty liver 07/04/2015  . GERD (gastroesophageal reflux disease)   . Hiatal hernia   . Hiatal hernia with gastroesophageal reflux 03/20/2014  . Hyperglycemia 07/13/2013  . Hyperplastic colon polyp   . Hypertension   . Internal hemorrhoids   . Lower extremity edema   . Mixed hyperlipidemia   . Murmur   . OSA (obstructive sleep apnea)    s/p UPPP  . Plantar fasciitis   . Pneumonia due to COVID-19 virus   . Prediabetes   . Reflux esophagitis   . Shoulder pain   . Stomach ulcer    from PCP  . Urinary hesitancy 09/30/2015  . Vitamin D deficiency     Objective: VITAL SIGNS: BP (!) 150/100 (BP Location: Left Arm, Patient Position: Sitting, Cuff Size: Large)   Pulse 71   Temp 98.2 F (36.8 C) (Oral)   Ht 6' (1.829 m)   Wt 276 lb 8 oz (125.4 kg)   SpO2 96%   BMI 37.50 kg/m  Constitutional: Well formed, well developed. No acute distress. Thorax & Lungs: No accessory muscle use Musculoskeletal: L elbow.   Normal active range of motion: yes.   Normal passive range of motion: yes Tenderness to palpation: yes over olecranon  process Deformity: no Ecchymosis: no Neurologic: Normal sensory function. Psychiatric: Normal mood. Age appropriate judgment and insight. Alert & oriented x 3.    Assessment:  Olecranon bursitis of left elbow - Plan: predniSONE (DELTASONE) 20 MG tablet  Elevated blood pressure reading  Plan: Stretches/exercises, heat, ice, Tylenol. 5 d pred burst 40 mg/d. Inject vs sports med referral if no better.  Monitor BP at home.  F/u prn. The patient voiced understanding and agreement to the plan.   [place], DO 11/01/20  9:26 AM

## 2020-11-01 NOTE — Patient Instructions (Addendum)
Ice/cold pack over area for 10-15 min twice daily.  OK to take Tylenol 1000 mg (2 extra strength tabs) or 975 mg (3 regular strength tabs) every 6 hours as needed.  Avoid striking the elbow.   Sometimes people use elbow pads.  If you are still having issues, sometimes I inject medicine directly into the area.   Let us know if you need anything.

## 2020-11-03 ENCOUNTER — Telehealth: Payer: Self-pay | Admitting: Pulmonary Disease

## 2020-11-03 NOTE — Telephone Encounter (Signed)
Call made to patient, confirmed DOB. Patient received letter regarding his CT results. He just wanted some reassurance that he was okay. Answered all patients questions. Patient reports [facility] did contact him and make aware there was a delay of about 3-4 months with his cpap machine. Patient also inquired as to when he should come back. I made him aware per his D/C summary 3-4 months however that may change if he has not received his cpap machine by that time. Voiced understanding.   Nothing further needed at this time.

## 2020-11-12 ENCOUNTER — Other Ambulatory Visit: Payer: Self-pay

## 2020-11-12 ENCOUNTER — Ambulatory Visit (INDEPENDENT_AMBULATORY_CARE_PROVIDER_SITE_OTHER): Payer: No Typology Code available for payment source | Admitting: Family Medicine

## 2020-11-12 ENCOUNTER — Encounter: Payer: Self-pay | Admitting: Family Medicine

## 2020-11-12 VITALS — BP 152/84 | HR 76 | Temp 97.8°F | Resp 17 | Ht 72.0 in | Wt 276.0 lb

## 2020-11-12 DIAGNOSIS — M7022 Olecranon bursitis, left elbow: Secondary | ICD-10-CM | POA: Diagnosis not present

## 2020-11-12 DIAGNOSIS — I10 Essential (primary) hypertension: Secondary | ICD-10-CM | POA: Diagnosis not present

## 2020-11-12 MED ORDER — TRIAMTERENE-HCTZ 37.5-25 MG PO TABS: 1.0000 | ORAL_TABLET | Freq: Every day | ORAL | 1 refills | Status: DC

## 2020-11-12 MED ORDER — CARVEDILOL 25 MG PO TABS: 25.0000 mg | ORAL_TABLET | Freq: Two times a day (BID) | ORAL | 3 refills | Status: DC

## 2020-11-12 MED ORDER — PANTOPRAZOLE SODIUM 40 MG PO TBEC
40.0000 mg | DELAYED_RELEASE_TABLET | Freq: Every day | ORAL | 5 refills | Status: DC
Start: 2020-11-12 — End: 2021-07-19

## 2020-11-12 MED ORDER — AMLODIPINE BESYLATE 10 MG PO TABS: ORAL_TABLET | ORAL | 1 refills | Status: DC

## 2020-11-12 NOTE — Patient Instructions (Signed)
Send me a message in 2 weeks if the elbow isn't better. We will set you up with the sports medicine team at that point.  Ice/cold pack over area for 10-15 min twice daily.  OK to take Tylenol 1000 mg (2 extra strength tabs) or 975 mg (3 regular strength tabs) every 6 hours as needed.  Check your blood pressures 2-3 times per week, alternating the time of day you check it. If it is high, considering waiting 1-2 minutes and rechecking. If it gets higher, your anxiety is likely creeping up and we should avoid rechecking.   Keep the diet clean and stay active.  Let us know if you need anything.

## 2020-11-12 NOTE — Progress Notes (Signed)
Chief Complaint  Patient presents with  . Elbow Pain     Left elbow pain, follow up, taken prednisone    Subjective: Patient is a 55 y.o. male here for f/u L elbow pain.  Given a 5 d pred burst for L olecranon bursitis that did not help. No improvement, not getting worsen. No recent inj or change in activity. No bruising/redness/swelling. ROM intact. Tylenol has been helpful.   Hypertension Patient presents for hypertension follow up. He does monitor home blood pressures. Blood pressures ranging on average from 140-150's/90's. He is compliant with medications- Norvasc 10 mg/d, Coreg 12.5 mg bid, Maxzide HCT 37.5-25 mg/d. Patient has these side effects of medication: none He is sometimes adhering to a healthy diet overall. Exercise: walking No CP or SOB.   Past Medical History:  Diagnosis Date  . Back pain   . Bone spur    left foot  . Chest pain   . Constipation   . COVID-19   . Diabetes mellitus type 2 in obese ([place]) 07/13/2013  . Diverticulosis   . Dyspnea   . Erectile dysfunction 01/15/2013  . Esophageal reflux 01/15/2013  . Fatty liver 07/04/2015  . GERD (gastroesophageal reflux disease)   . Hiatal hernia   . Hiatal hernia with gastroesophageal reflux 03/20/2014  . Hyperglycemia 07/13/2013  . Hyperplastic colon polyp   . Hypertension   . Internal hemorrhoids   . Lower extremity edema   . Mixed hyperlipidemia   . Murmur   . OSA (obstructive sleep apnea)    s/p UPPP  . Plantar fasciitis   . Pneumonia due to COVID-19 virus   . Prediabetes   . Reflux esophagitis   . Shoulder pain   . Stomach ulcer    from PCP  . Urinary hesitancy 09/30/2015  . Vitamin D deficiency     Objective: BP (!) 152/84 (BP Location: Right Arm, Patient Position: Sitting, Cuff Size: Large)   Pulse 76   Temp 97.8 F (36.6 C)   Resp 17   Ht 6' (1.829 m)   Wt 276 lb (125.2 kg)   SpO2 99%   BMI 37.43 kg/m  General: Awake, appears stated age HEENT: MMM, EOMi Heart: RRR, no  murmurs Lungs: CTAB, no rales, wheezes or rhonchi. No accessory muscle use MSK :+TTP over the L elbow Psych: Age appropriate judgment and insight, normal affect and mood  Procedure note: Olecranon bursa injection Informed consent obtained. The area of interest was palpated and 2 cm distal an otoscope speculum was used to mark the area of skin. The area was cleaned with alcohol and then topical freeze spray was used for anesthesia. A 25-gauge needle was used to introduce 20 mg of Depo-Medrol and 1.5 mL of 1% lidocaine without epinephrine.  The plunger was withdrawn first to ensure placement was not inside a blood vessel. A Band-Aid was placed. There were no immediate complications noted. The patient tolerated the procedure well.  Assessment and Plan: Essential hypertension - Plan: triamterene-hydrochlorothiazide (MAXZIDE-25) 37.5-25 MG tablet  Olecranon bursitis of left elbow - Plan: PR DRAIN/INJECT INTERMEDIATE JOINT/BURSA  1.  Check blood pressure with his monitor and then I rechecked it afterwards.  It is fairly accurate.  Continue above medication.  Will increase Coreg dosage from 12.5 mg to 25 mg twice daily.  Take amlodipine at night, 10 mg.  Counseled on diet and exercise.  Continue to monitor blood pressure at home.  He will follow-up in 1 month with his regular PCP for this. 2.  Injection today, ice, Tylenol.  If no improvement over the next couple weeks, he will send me a message and I will refer him to sports medicine. The patient voiced understanding and agreement to the plan.  [place], DO 11/12/20  8:36 AM

## 2020-11-15 ENCOUNTER — Encounter: Payer: Self-pay | Admitting: Pulmonary Disease

## 2020-11-30 MED ORDER — METHYLPREDNISOLONE ACETATE 40 MG/ML IJ SUSP
20.0000 mg | Freq: Once | INTRAMUSCULAR | Status: AC
  Administered 2020-11-12: 20 mg via INTRA_ARTICULAR

## 2020-11-30 NOTE — Addendum Note (Signed)
Addended by: Wynonia Musty A on: 11/30/2020 02:06 PM   Modules accepted: Orders

## 2020-12-02 ENCOUNTER — Other Ambulatory Visit: Payer: Self-pay

## 2020-12-02 ENCOUNTER — Ambulatory Visit (INDEPENDENT_AMBULATORY_CARE_PROVIDER_SITE_OTHER): Payer: No Typology Code available for payment source | Admitting: Podiatry

## 2020-12-02 ENCOUNTER — Ambulatory Visit (INDEPENDENT_AMBULATORY_CARE_PROVIDER_SITE_OTHER): Payer: No Typology Code available for payment source

## 2020-12-02 DIAGNOSIS — M7662 Achilles tendinitis, left leg: Secondary | ICD-10-CM

## 2020-12-02 DIAGNOSIS — M722 Plantar fascial fibromatosis: Secondary | ICD-10-CM

## 2020-12-02 DIAGNOSIS — M7661 Achilles tendinitis, right leg: Secondary | ICD-10-CM

## 2020-12-04 IMAGING — DX DG CHEST 1V PORT
1 series · 1 of 1 positions shown · non-contrast
Comparison: November 26, 2018

CLINICAL DATA: Chest pain and shortness of breath.

EXAM:
PORTABLE CHEST 1 VIEW

[chest ap]
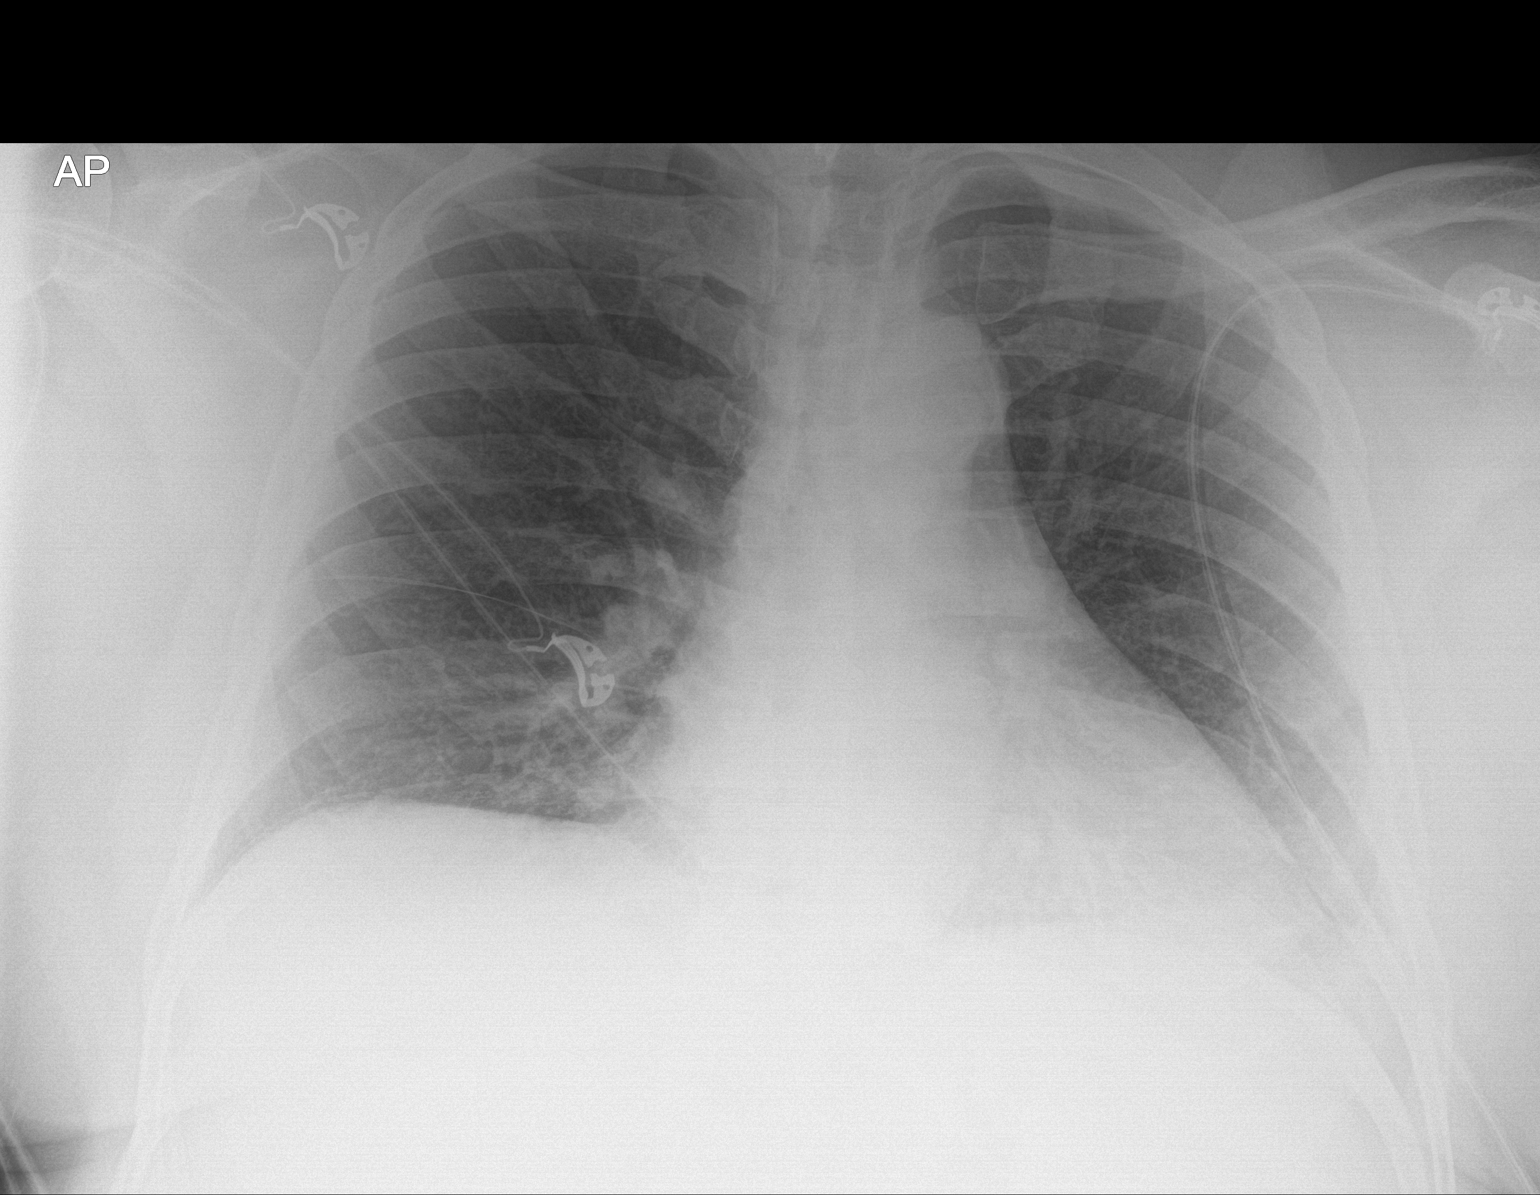

[1 of 1 positions shown; findings below may reference images not displayed]

FINDINGS: The lungs are clear. Heart is mildly enlarged with pulmonary
vascularity normal. No adenopathy. No bone lesions.
IMPRESSION: Mild cardiomegaly.  No edema or consolidation.

## 2020-12-05 DIAGNOSIS — M722 Plantar fascial fibromatosis: Secondary | ICD-10-CM | POA: Diagnosis not present

## 2020-12-05 MED ORDER — TRIAMCINOLONE ACETONIDE 10 MG/ML IJ SUSP
10.0000 mg | Freq: Once | INTRAMUSCULAR | Status: AC
  Administered 2020-12-05: 10 mg

## 2020-12-05 NOTE — Progress Notes (Signed)
Subjective:   Patient ID: [name] Foster, male   DOB: 55 y.o.   MRN: [id number]   HPI Patient states that he has developed a lot of discomfort in the bottom of his left heel and states that the back of both of his heels are improved especially the surgical one on the right but he still gets occasional discomfort and needs new orthotics   ROS      Objective:  Physical Exam  Neurovascular status intact with patient found to have exquisite discomfort plantar aspect left heel insertional point calcaneus and also is noted to have moderate flatfoot deformity bilateral with chronic discomfort posterior heel region with diminishment of discomfort with surgery right     Assessment:  Acute plantar fasciitis of the left heel along with history of chronic Achilles tendinitis     Plan:  H&P reviewed both conditions.  For the plantar heel left I did sterile prep and injected the fascia 3 mg Kenalog 5 mg Xylocaine advised on good support therapy and casted for new orthotics and he will wear the old ones until we can get them back.  Patient will be seen back encouraged to call questions concerns  X-rays indicate flatfoot deformity does indicate spur formation plantar heels with satisfactory resection of spurring posterior right heel

## 2020-12-13 ENCOUNTER — Ambulatory Visit: Payer: No Typology Code available for payment source | Admitting: Family Medicine

## 2020-12-13 DIAGNOSIS — Z0289 Encounter for other administrative examinations: Secondary | ICD-10-CM

## 2020-12-14 NOTE — Progress Notes (Signed)
CARDIOLOGY NEW PATIENT CLINC NOTE  Primary Care: [name], MD   HPI:  [name] Foster is 55 y/o male (husband of [place] Schoffstall) with DM2, HTN, HL, OSA who presents today to re-establish cardiac care.   We last saw him in 2017 for CP.  He has been found to have GERD with hypomotile esophagus and lower esophageal sphincter incompetence.    2011 Echo EF 62% grade 2 diastolic dysfunction. Also had normal ETT.  2013 Cardiac CT. Normal cors. Ca++ score = 0.  2016 Echo LVEF 55-60%, Grade 1 DD  Had EGD and RUQ u/s in 12/13. Both normal except for fatty liver.   Had COVID last year. Chest CT 2/22: There is mild, bandlike post infectious or inflammatory scarring of the bilateral lung bases. No evidence of fibrotic interstitial lung disease.  Has been on disability for a leg injury. Not working currently. Still having CP every day. Comes and go. Mostly with eating. No change with exertion. Reports had GI appt about a year ago and was told he a problem with his esophageal sphincter. Recently diagnosed with DM2. SOB if he walks too far or goes up a flight of steps     Review of Systems: [y] = yes, '[ ]'  = no   General: Weight gain '[ ]' ; Weight loss '[ ]' ; Anorexia '[ ]' ; Fatigue '[ ]' ; Fever '[ ]' ; Chills '[ ]' ; Weakness '[ ]'   Cardiac: Chest pain/pressure Blue.Reese ]; Resting SOB '[ ]' ; Exertional SOB '[ ]' ; Orthopnea '[ ]' ; Pedal Edema '[ ]' ; Palpitations '[ ]' ; Syncope '[ ]' ; Presyncope '[ ]' ; Paroxysmal nocturnal dyspnea'[ ]'   Pulmonary: Cough '[ ]' ; Wheezing'[ ]' ; Hemoptysis'[ ]' ; Sputum '[ ]' ; Snoring '[ ]'   GI: Vomiting'[ ]' ; Dysphagia'[ ]' ; Melena'[ ]' ; Hematochezia '[ ]' ; Heartburn[ y]; Abdominal pain [ y]; Constipation '[ ]' ; Diarrhea '[ ]' ; BRBPR '[ ]'   GU: Hematuria'[ ]' ; Dysuria '[ ]' ; Nocturia'[ ]'   Vascular: Pain in legs with walking '[ ]' ; Pain in feet with lying flat '[ ]' ; Non-healing sores '[ ]' ; Stroke '[ ]' ; TIA '[ ]' ; Slurred speech '[ ]' ;  Neuro: Headaches'[ ]' ; Vertigo'[ ]' ; Seizures'[ ]' ; Paresthesias'[ ]' ;Blurred vision '[ ]' ; Diplopia '[ ]' ; Vision changes  '[ ]'   Ortho/Skin: Arthritis [ y]; Joint pain Blue.Reese ]; Muscle pain '[ ]' ; Joint swelling '[ ]' ; Back Pain '[ ]' ; Rash '[ ]'   Psych: Depression'[ ]' ; Anxiety'[ ]'   Heme: Bleeding problems '[ ]' ; Clotting disorders '[ ]' ; Anemia '[ ]'   Endocrine: Diabetes [ y]; Thyroid dysfunction'[ ]'    Past Medical History:  Diagnosis Date  . Back pain   . Bone spur    left foot  . Chest pain   . CHF (congestive heart failure) ([place])   . Constipation   . COVID-19   . Diabetes mellitus type 2 in obese ([place]) 07/13/2013  . Diverticulosis   . Dyspnea   . Erectile dysfunction 01/15/2013  . Esophageal reflux 01/15/2013  . Fatty liver 07/04/2015  . GERD (gastroesophageal reflux disease)   . Hiatal hernia   . Hiatal hernia with gastroesophageal reflux 03/20/2014  . Hyperglycemia 07/13/2013  . Hyperplastic colon polyp   . Hypertension   . Internal hemorrhoids   . Lower extremity edema   . Mixed hyperlipidemia   . Murmur   . OSA (obstructive sleep apnea)    s/p UPPP  . Plantar fasciitis   . Pneumonia due to COVID-19 virus   . Prediabetes   . Reflux esophagitis   . Shoulder pain   .  Stomach ulcer    from PCP  . Urinary hesitancy 09/30/2015  . Vitamin D deficiency     Current Outpatient Medications  Medication Sig Dispense Refill  . acetaminophen (TYLENOL) 500 MG tablet Take 1 tablet (500 mg total) by mouth every 6 (six) hours as needed. 60 tablet 0  . albuterol (VENTOLIN HFA) 108 (90 Base) MCG/ACT inhaler Inhale 2 puffs into the lungs every 4 (four) hours as needed for wheezing or shortness of breath. 18 g 1  . amLODipine (NORVASC) 10 MG tablet Take 1 tab every evening. 90 tablet 1  . ascorbic acid (VITAMIN C) 500 MG tablet Take 500 mg by mouth daily.    [name] aspirin EC 81 MG tablet Take 81 mg by mouth daily. Swallow whole.    Pleas Patricia Microlet Lancets lancets Test blood sugars twice daily 100 each 0  . Blood Glucose Monitoring Suppl (ACCU-CHEK GUIDE ME) w/Device KIT     . Blood Glucose Monitoring Suppl (CONTOUR NEXT MONITOR)  w/Device KIT 1 kit by Does not apply route 2 (two) times daily. 1 kit 0  . buPROPion (WELLBUTRIN SR) 150 MG 12 hr tablet Take 1 tablet by mouth once daily 30 tablet 0  . carvedilol (COREG) 25 MG tablet Take 1 tablet (25 mg total) by mouth 2 (two) times daily with a meal. 60 tablet 3  . Cholecalciferol (VITAMIN D) 50 MCG (2000 UT) CAPS Take 1 capsule (2,000 Units total) by mouth daily. 90 capsule 0  . famotidine (PEPCID) 20 MG tablet Take 1 tablet (20 mg total) by mouth 2 (two) times daily. 60 tablet 3  . fluticasone (FLONASE) 50 MCG/ACT nasal spray Use 2 spray(s) in each nostril once daily 16 g 0  . glucose blood (CONTOUR NEXT TEST) test strip Test blood sugars twice daily 100 each 0  . ibuprofen (ADVIL) 600 MG tablet Take 1 tablet (600 mg total) by mouth every 8 (eight) hours as needed. 90 tablet 0  . melatonin 5 MG TABS Take 5 mg by mouth at bedtime.    . metFORMIN (GLUCOPHAGE) 500 MG tablet TAKE 1 TABLET BY MOUTH TWICE DAILY WITH A MEAL 60 tablet 0  . Misc. Devices (PULSE OXIMETER FOR FINGER) MISC 1 Device by Does not apply route as needed (SOB, fatigue, headache patient with COVID). 1 each 0  . Multiple Vitamins-Minerals (MULTIVITAMIN WITH MINERALS) tablet Take 1 tablet by mouth daily. 100 tablet 5  . pantoprazole (PROTONIX) 40 MG tablet Take 1 tablet (40 mg total) by mouth daily. 30 tablet 5  . rosuvastatin (CRESTOR) 40 MG tablet Take 0.5 tablets (20 mg total) by mouth daily. 30 tablet 3  . tadalafil (CIALIS) 5 MG tablet TAKE 1 TABLET BY MOUTH ONCE DAILY AS NEEDED FOR ERECTILE DYSFUNCTION 30 tablet 0  . tamsulosin (FLOMAX) 0.4 MG CAPS capsule Take 1 capsule by mouth once daily 30 capsule 0  . tiZANidine (ZANAFLEX) 4 MG tablet Take 0.5-1 tablets (2-4 mg total) by mouth 2 (two) times daily as needed for muscle spasms. 40 tablet 0  . triamterene-hydrochlorothiazide (MAXZIDE-25) 37.5-25 MG tablet Take 1 tablet by mouth daily. 90 tablet 1  . Vitamin D, Ergocalciferol, (DRISDOL) 1.25 MG (50000 UNIT)  CAPS capsule TAKE 1 CAPSULE BY MOUTH ONE TIME PER WEEK 12 capsule 0  . zinc gluconate 50 MG tablet Take 50 mg by mouth daily.     No current facility-administered medications for this encounter.    Allergies  Allergen Reactions  . Hydrocodone Itching  . Tramadol Itching  Social History   Socioeconomic History  . Marital status: Married    Spouse name: [name]  . Number of children: Not on file  . Years of education: Not on file  . Highest education level: Not on file  Occupational History  . Occupation: Nature conservation officer: [employer]  Tobacco Use  . Smoking status: Never Smoker  . Smokeless tobacco: Never Used  Vaping Use  . Vaping Use: Never used  Substance and Sexual Activity  . Alcohol use: No  . Drug use: No  . Sexual activity: Yes    Partners: Female  Other Topics Concern  . Not on file  Social History Narrative   Tobacco Use - No.    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 Dementia Mother   . Diabetes Mother   . Hypertension Mother   . Heart failure Mother   . Stroke Mother   . Obesity Mother   . Hyperlipidemia Mother   . Heart disease Mother   . Depression Mother   . Hypertension Other        siblings  . Diabetes Sister   . Cancer Brother        lung cancer smoker  . Heart attack Maternal Grandmother   . Diabetes Sister   . Pancreatic disease Sister    . Obesity Father   . Alcoholism Father   . Colon cancer Neg Hx     Vitals:   12/15/20 1048  BP: (!) 150/90  Pulse: 64  SpO2: 96%  Weight: 123.7 kg   Wt Readings from Last 3 Encounters:  12/15/20 123.7 kg  11/12/20 125.2 kg  11/01/20 125.4 kg     PHYSICAL EXAM: General:  Well appearing. No resp difficulty HEENT: normal Neck: supple. no JVD. Carotids 2+ bilat; no bruits. No lymphadenopathy or thryomegaly appreciated. Cor: PMI nondisplaced. Regular rate & rhythm. No rubs, gallops or murmurs. Lungs: clear Abdomen: obese soft, nontender, nondistended. No hepatosplenomegaly. No bruits or masses. Good bowel sounds. Extremities: no cyanosis, clubbing, rash, edema Neuro: alert & orientedx3, cranial nerves grossly intact. moves all 4 extremities w/o difficulty. Affect pleasant  ECG: Sinus brady 59 1AVB 266m Personally reviewed   ASSESSMENT & PLAN:  1. Chest pain: - This has been a chronic problem for him Patient had normal coronary CT in 11/13.   - symptoms mostly atypical and seem GI in nature. That said he has multiple CRFs - Will proceed with coronary calcium scoring without angiography. If coronary calcium on the rise will need further evaluation and potentially cath 2. OSA:  - Has not been wearing CPAP. Awaiting re-test.Will try to get him an Itamar device  3. HTN:  - Mildly  elevated here. Need to continue to follow at home closely - Encouraged weight with low-carb diet ([place]) 4. Hyperlipidemia:  -  Continue statin   Glori Bickers, MD  11:22 AM

## 2020-12-15 ENCOUNTER — Ambulatory Visit (HOSPITAL_COMMUNITY)
Admission: RE | Admit: 2020-12-15 | Discharge: 2020-12-15 | Disposition: A | Discharge status: Home / Self Care | Payer: No Typology Code available for payment source | Source: Ambulatory Visit | Attending: Internal Medicine | Admitting: Internal Medicine

## 2020-12-15 ENCOUNTER — Other Ambulatory Visit: Payer: Self-pay

## 2020-12-15 ENCOUNTER — Encounter (HOSPITAL_COMMUNITY): Payer: Self-pay | Admitting: Internal Medicine

## 2020-12-15 VITALS — BP 150/90 | HR 64 | Wt 272.8 lb

## 2020-12-15 DIAGNOSIS — Z7984 Long term (current) use of oral hypoglycemic drugs: Secondary | ICD-10-CM | POA: Diagnosis not present

## 2020-12-15 DIAGNOSIS — I110 Hypertensive heart disease with heart failure: Secondary | ICD-10-CM | POA: Insufficient documentation

## 2020-12-15 DIAGNOSIS — Z79899 Other long term (current) drug therapy: Secondary | ICD-10-CM | POA: Diagnosis not present

## 2020-12-15 DIAGNOSIS — E782 Mixed hyperlipidemia: Secondary | ICD-10-CM | POA: Diagnosis not present

## 2020-12-15 DIAGNOSIS — R079 Chest pain, unspecified: Secondary | ICD-10-CM | POA: Diagnosis not present

## 2020-12-15 DIAGNOSIS — G4733 Obstructive sleep apnea (adult) (pediatric): Secondary | ICD-10-CM | POA: Diagnosis not present

## 2020-12-15 DIAGNOSIS — Z7982 Long term (current) use of aspirin: Secondary | ICD-10-CM | POA: Insufficient documentation

## 2020-12-15 DIAGNOSIS — Z8616 Personal history of COVID-19: Secondary | ICD-10-CM | POA: Insufficient documentation

## 2020-12-15 DIAGNOSIS — E785 Hyperlipidemia, unspecified: Secondary | ICD-10-CM | POA: Insufficient documentation

## 2020-12-15 DIAGNOSIS — E119 Type 2 diabetes mellitus without complications: Secondary | ICD-10-CM | POA: Insufficient documentation

## 2020-12-15 DIAGNOSIS — I509 Heart failure, unspecified: Secondary | ICD-10-CM | POA: Diagnosis not present

## 2020-12-15 DIAGNOSIS — I10 Essential (primary) hypertension: Secondary | ICD-10-CM | POA: Diagnosis not present

## 2020-12-15 HISTORY — DX: Heart failure, unspecified: I50.9

## 2020-12-15 NOTE — Patient Instructions (Signed)
Calcium Score CT Scan at [facility], this is a self-pay test and cost $150, due up front  Your provider has recommended that you have a home sleep study.  We have provided you with the equipment in our office today. ONCE APPROVED BY YOUR INSURANCE COMPANY WE WILL CALL AND ADVISE YOU TO COMPLETE THE TEST. Please download the app and follow the instructions. YOUR PIN NUMBER IS: [id number]. Once you have completed the test you just dispose of the equipment, the information is automatically uploaded to [place] via blue-tooth technology. If your test is positive for sleep apnea and you need a home CPAP machine you will be contacted by Dr Theodosia Blender office [facility]) to set this up.  Please call our office in 1 year to schedule your follow up appointment

## 2020-12-15 NOTE — Progress Notes (Signed)
Height: 6"    Weight: 272 lb BMI:  37.43  Today's Date: 12/15/20  STOP BANG RISK ASSESSMENT S (snore) Have you been told that you snore?     YES   T (tired) Are you often tired, fatigued, or sleepy during the day?   YES  O (obstruction) Do you stop breathing, choke, or gasp during sleep? NO   P (pressure) Do you have or are you being treated for high blood pressure? YES   B (BMI) Is your body index greater than 35 kg/m? YES   A (age) Are you 53 years old or older? YES   N (neck) Do you have a neck circumference greater than 16 inches?      G (gender) Are you a male? YES   TOTAL STOP/BANG "YES" ANSWERS 6                                                                       For Office Use Only              Procedure Order Form    YES to 3+ Stop Bang questions OR two clinical symptoms - patient qualifies for WatchPAT (CPT 95800)      Clinical Notes: Will consult Sleep Specialist and refer for management of therapy due to patient increased risk of Sleep Apnea. Ordering a sleep study due to the following two clinical symptoms: Excessive daytime sleepiness G47.10 / Loud snoring R06.83

## 2020-12-15 NOTE — Addendum Note (Signed)
Encounter addended by: Scarlette Calico, RN on: 12/15/2020 11:52 AM  Actions taken: Order list changed, Diagnosis association updated, Clinical Note Signed

## 2020-12-30 ENCOUNTER — Telehealth: Payer: Self-pay | Admitting: Pulmonary Disease

## 2020-12-30 ENCOUNTER — Telehealth: Payer: Self-pay

## 2020-12-30 NOTE — Telephone Encounter (Signed)
I have called and LM on VM for  Terri with [facility] to call triage back.

## 2020-12-30 NOTE — Telephone Encounter (Signed)
Con Memos returned call and said that Dr Missy Sabins office had cx their office and is just going to let pulmonary handle everything pertaining to pt's sleep test.Stanley A Dalton

## 2020-12-30 NOTE — Telephone Encounter (Signed)
Per Dr Bensimhon's nurse order for [name] Foster One has been canceled.  Pulmonologist is scheduling sleep study. Pt notified to return device unopened.  Call to patient again this afternoon to let him know pulmonology is working on his sleep study and to confirm information provided earlier today as he told Probation officer during earlier conversation he wasn't sure what he was supposed to do.

## 2021-01-12 ENCOUNTER — Ambulatory Visit (INDEPENDENT_AMBULATORY_CARE_PROVIDER_SITE_OTHER): Payer: No Typology Code available for payment source | Admitting: Podiatry

## 2021-01-12 ENCOUNTER — Encounter: Payer: Self-pay | Admitting: Podiatry

## 2021-01-12 ENCOUNTER — Other Ambulatory Visit: Payer: Self-pay

## 2021-01-12 DIAGNOSIS — M7662 Achilles tendinitis, left leg: Secondary | ICD-10-CM

## 2021-01-12 DIAGNOSIS — M722 Plantar fascial fibromatosis: Secondary | ICD-10-CM

## 2021-01-12 DIAGNOSIS — M7661 Achilles tendinitis, right leg: Secondary | ICD-10-CM

## 2021-01-12 MED ORDER — TRIAMCINOLONE ACETONIDE 10 MG/ML IJ SUSP
10.0000 mg | Freq: Once | INTRAMUSCULAR | Status: AC
  Administered 2021-01-12: 10 mg

## 2021-01-12 NOTE — Progress Notes (Signed)
Subjective:   Patient ID: [name] Foster, male   DOB: 55 y.o.   MRN: [id number]   HPI Patient states the bottom of my left heel is hurting and it also concerned because I am getting pain on the back of the left heel.  The right is doing very well after surgery    ROS      Objective:  Physical Exam  Neurovascular status intact exquisite discomfort plantar aspect left heel insertional point tendon calcaneus with moderate posterior pain secondary to Achilles tendon inflammation     Assessment:  Acute Planter fasciitis left still present along with Achilles tendon irritation     Plan:  H&P reviewed condition sterile prep injected the plantar fascial left 3 mg Dexasone Kenalog 5 mg Xylocaine discussed ice therapy to the posterior heel and hopefully will get better and dispensed orthotics that fit well and will be seen back to recheck 1 month or earlier if needed may require endoscopic release

## 2021-01-18 ENCOUNTER — Ambulatory Visit (INDEPENDENT_AMBULATORY_CARE_PROVIDER_SITE_OTHER)
Admission: RE | Admit: 2021-01-18 | Discharge: 2021-01-18 | Disposition: A | Discharge status: Home / Self Care | Payer: Self-pay | Source: Ambulatory Visit | Attending: Internal Medicine | Admitting: Internal Medicine

## 2021-01-18 ENCOUNTER — Other Ambulatory Visit: Payer: Self-pay

## 2021-01-18 DIAGNOSIS — R079 Chest pain, unspecified: Secondary | ICD-10-CM

## 2021-01-21 ENCOUNTER — Telehealth: Payer: Self-pay | Admitting: Pulmonary Disease

## 2021-01-21 NOTE — Telephone Encounter (Signed)
Called pt to let him know that I have given this order to [facility] to make sure PA is not required.  Once we get th at back, we will call the pt to schedule.  Pt verbalized understanding.

## 2021-01-21 NOTE — Telephone Encounter (Signed)
On 11/01/20, pt was to call back to schedule the HST.  On 11/15/20, LM & mailed pt a letter to schedule this HST.  I will call the patient & place on the schedule with one of the PCCs.

## 2021-01-31 ENCOUNTER — Other Ambulatory Visit: Payer: Self-pay

## 2021-01-31 DIAGNOSIS — N529 Male erectile dysfunction, unspecified: Secondary | ICD-10-CM

## 2021-01-31 MED ORDER — TADALAFIL 5 MG PO TABS: 5.0000 mg | ORAL_TABLET | Freq: Every day | ORAL | 0 refills | Status: DC | PRN

## 2021-02-01 ENCOUNTER — Other Ambulatory Visit: Payer: Self-pay

## 2021-02-01 ENCOUNTER — Ambulatory Visit: Payer: No Typology Code available for payment source

## 2021-02-01 DIAGNOSIS — G4730 Sleep apnea, unspecified: Secondary | ICD-10-CM

## 2021-02-02 ENCOUNTER — Telehealth (INDEPENDENT_AMBULATORY_CARE_PROVIDER_SITE_OTHER): Payer: No Typology Code available for payment source | Admitting: Medical

## 2021-02-02 ENCOUNTER — Encounter: Payer: Self-pay | Admitting: Medical

## 2021-02-02 ENCOUNTER — Ambulatory Visit (HOSPITAL_BASED_OUTPATIENT_CLINIC_OR_DEPARTMENT_OTHER)
Admission: RE | Admit: 2021-02-02 | Discharge: 2021-02-02 | Disposition: A | Discharge status: Home / Self Care | Payer: No Typology Code available for payment source | Source: Ambulatory Visit | Attending: Medical | Admitting: Medical

## 2021-02-02 VITALS — BP 167/102 | Ht 72.0 in

## 2021-02-02 DIAGNOSIS — J069 Acute upper respiratory infection, unspecified: Secondary | ICD-10-CM

## 2021-02-02 DIAGNOSIS — I10 Essential (primary) hypertension: Secondary | ICD-10-CM | POA: Diagnosis not present

## 2021-02-02 DIAGNOSIS — I509 Heart failure, unspecified: Secondary | ICD-10-CM | POA: Diagnosis not present

## 2021-02-02 DIAGNOSIS — R059 Cough, unspecified: Secondary | ICD-10-CM | POA: Diagnosis not present

## 2021-02-02 MED ORDER — ALBUTEROL SULFATE HFA 108 (90 BASE) MCG/ACT IN AERS: 2.0000 | INHALATION_SPRAY | Freq: Four times a day (QID) | RESPIRATORY_TRACT | 0 refills | Status: DC | PRN

## 2021-02-02 MED ORDER — BENZONATATE 100 MG PO CAPS: 100.0000 mg | ORAL_CAPSULE | Freq: Three times a day (TID) | ORAL | 0 refills | Status: DC | PRN

## 2021-02-02 MED ORDER — LOSARTAN POTASSIUM 25 MG PO TABS: 25.0000 mg | ORAL_TABLET | Freq: Every day | ORAL | 0 refills | Status: DC

## 2021-02-02 NOTE — Progress Notes (Addendum)
Subjective:    Patient ID: [name] Foster, male    DOB: 25-Jul-1966, 55 y.o.   MRN: [id number]  HPI  Virtual Visit via Video Note  I connected with Darrion E Barkan on 02/02/21 at  9:40 AM EDT by a video enabled telemedicine application and verified that I am speaking with the correct person using two identifiers.  Location: Patient: home Provider: office.   I discussed the limitations of evaluation and management by telemedicine and the availability of in person appointments. The patient expressed understanding and agreed to proceed.  Participants-pt and myself.  History of Present Illness:   Pt sick since yesterday. He states 6 pm last night developed chest congestion, slight dizziness, fatigue, body aches and headache.  Pt states was coughing some yesterday. Cough less today. Cough is dry. No wheezing. Pt had mild sob last night. He took 2 albuterol last night and sob resolved.  Pt never had covid vaccines. Pt did have covid in November 2020. Pt was hospitalized for 5 days back then.  Pt does have hx of chf.  Pt has not done rapid covid test.  No swelling of legs. No popliteal pain.  Last gfr in computer 90.69.  Pt has htn. On amlodipine 10 mg daily, coreg 25 mg twice daily and maxzid 37.5-25 daily. Pt is not on entresto. Pt was out of amlodipine for about 1.5 weeks. Just got new refill 2 days ago for amlodipine.    Observations/Objective:  General-no acute distress, pleasant, oriented.  Lungs- on inspection lungs appear unlabored. Neck- no tracheal deviation or jvd on inspection. Neuro- gross motor function appears intact.   Assessment and Plan: Early onset viral syndrome in patient with diabetes, CHF and hypertension.  Also probable history of asthma and he does use inhalers in the past.  No prior COVID-vaccine but did have COVID November 2020.  This resulted in being hospitalized for 5 days.  Due to above advised patient to get rapid COVID test today and notify  immediately and if positive.  If negative advised to repeat test again on Friday morning.  Again notify me immediately if positive.  Explained to patient that with his medical history and recent signs symptoms want to know if COVID-positive test as could prescribe paxlovid early on.  Ideal to start within 5 days of symptom onset.  Use Flonase nasal spray for congestion and prescribed benzonatate.  Refill patient's albuterol for shortness of breath or wheezing.  With history of CHF and above condition do want to get chest x-ray today.  Makes sure no CHF flare.  Blood pressure is high today and patient has been off of amlodipine for about 1 and half weeks.  Just restarted amlodipine on Monday.  May take for 7 days to take full effect.  During the interim.  Patient prescription for losartan 25 mg #14 take 1 tab daily.  Follow-up in 7 days in office if COVID testing negative.  Follow-up virtual visit if COVID test positive.  Follow Up Instructions:    I discussed the assessment and treatment plan with the patient. The patient was provided an opportunity to ask questions and all were answered. The patient agreed with the plan and demonstrated an understanding of the instructions.   The patient was advised to call back or seek an in-person evaluation if the symptoms worsen or if the condition fails to improve as anticipated.  Time spent with patient today was 41 minutes which consisted of chart review, discussing diagnoses, work up, treatment  and documentation.   Mackie Pai, PA-C     Review of Systems     Objective:   Physical Exam        Assessment & Plan:

## 2021-02-02 NOTE — Patient Instructions (Addendum)
Early onset viral syndrome in patient with diabetes, CHF and hypertension.  Also probable history of asthma and he does use inhalers in the past.  No prior COVID-vaccine but did have COVID November 2020.  This resulted in being hospitalized for 5 days.  Due to above advised patient to get rapid COVID test today and notify immediately and if positive.  If negative advised to repeat test again on Friday morning.  Again notify me immediately if positive.  Explained to patient that with his medical history and recent signs symptoms want to know if COVID-positive test as could prescribe paxlovid early on.  Ideal to start within 5 days of symptom onset.  Use Flonase nasal spray for congestion and prescribed benzonatate.  Refill patient's albuterol for shortness of breath or wheezing.  With history of CHF and above condition do want to get chest x-ray today.  Makes sure no CHF flare.  Blood pressure is high today and patient has been off of amlodipine for about 1 and half weeks.  Just restarted amlodipine on Monday.  May take for 7 days to take full effect.  During the interim.  Patient prescription for losartan 25 mg #14 take 1 tab daily.  Some recent mild dizziness with above.  Advised patient that if he has any gross motor or sensory function deficits type signs symptoms then be evaluated in the emergency department.  Patient expressed understanding.  Follow-up in 7 days in office if COVID testing negative.  Follow-up virtual visit if COVID test positive.

## 2021-02-03 ENCOUNTER — Telehealth: Payer: Self-pay | Admitting: Family Medicine

## 2021-02-03 ENCOUNTER — Encounter: Payer: Self-pay | Admitting: Medical

## 2021-02-03 NOTE — Addendum Note (Signed)
Addended by: Anabel Halon on: 02/03/2021 10:41 AM   Modules accepted: Level of Service

## 2021-02-03 NOTE — Telephone Encounter (Signed)
Patient was seeing by Percell Miller. Patient states he took a home covid test. Test is positive. Please advise on any other medications.

## 2021-02-03 NOTE — Addendum Note (Signed)
Addended by: Anabel Halon on: 02/03/2021 08:06 AM   Modules accepted: Level of Service

## 2021-02-04 ENCOUNTER — Encounter: Payer: Self-pay | Admitting: Medical

## 2021-02-04 ENCOUNTER — Other Ambulatory Visit: Payer: Self-pay

## 2021-02-04 ENCOUNTER — Telehealth: Payer: Self-pay | Admitting: Family Medicine

## 2021-02-04 ENCOUNTER — Telehealth (INDEPENDENT_AMBULATORY_CARE_PROVIDER_SITE_OTHER): Payer: No Typology Code available for payment source | Admitting: Medical

## 2021-02-04 VITALS — BP 116/68 | HR 89 | Temp 97.4°F

## 2021-02-04 DIAGNOSIS — I10 Essential (primary) hypertension: Secondary | ICD-10-CM

## 2021-02-04 DIAGNOSIS — U071 COVID-19: Secondary | ICD-10-CM | POA: Diagnosis not present

## 2021-02-04 DIAGNOSIS — R059 Cough, unspecified: Secondary | ICD-10-CM | POA: Diagnosis not present

## 2021-02-04 MED ORDER — NIRMATRELVIR/RITONAVIR (PAXLOVID)TABLET: 3.0000 | ORAL_TABLET | Freq: Two times a day (BID) | ORAL | 0 refills | Status: DC

## 2021-02-04 MED ORDER — NIRMATRELVIR/RITONAVIR (PAXLOVID)TABLET: 3.0000 | ORAL_TABLET | Freq: Two times a day (BID) | ORAL | 0 refills | Status: AC

## 2021-02-04 MED ORDER — AZITHROMYCIN 250 MG PO TABS: ORAL_TABLET | ORAL | 0 refills | Status: AC

## 2021-02-04 NOTE — Telephone Encounter (Signed)
Rx sent to pharmacy. Pt had video visit today.

## 2021-02-04 NOTE — Telephone Encounter (Signed)
Called pt and lvm to return call.  

## 2021-02-04 NOTE — Progress Notes (Signed)
Subjective:    Patient ID: [name] Foster, male    DOB: 11-Mar-1966, 55 y.o.   MRN: [id number]  HPI  Virtual Visit via Video Note  I connected with [name] Foster on 02/04/21 at  2:40 PM EDT by a video enabled telemedicine application and verified that I am speaking with the correct person using two identifiers.  Location: Patient: home Provider: office.  Participants- pt, myself and wife   I discussed the limitations of evaluation and management by telemedicine and the availability of in person appointments. The patient expressed understanding and agreed to proceed.  History of Present Illness:  Pt tested + positive for covid wed home test. PCR positive on Thursday. Pt states that he feels about the same.  No coughing up mucus. Has chest congestion. Some nausea. Mild dizziness. Pt has tessalon perles. He is on vit D 50,000 weekley and on on vit D 2000 units. Also on zinc 50 mg daily.  Pt symptoms came on 02-01-2021.  Last hpi and A/P below.  Pt sick since yesterday. He states 6 pm last night developed chest congestion, slight dizziness, fatigue, body aches and headache.  Pt states was coughing some yesterday. Cough less today. Cough is dry. No wheezing. Pt had mild sob last night. He took 2 albuterol last night and sob resolved.  Pt never had covid vaccines. Pt did have covid in November 2020. Pt was hospitalized for 5 days back then.  Pt does have hx of chf. No pedal edema.  Pt has not done rapid covid test.  No swelling of legs. No popliteal pain.  Last gfr in computer 90.69.  Pt has htn. On amlodipine 10 mg daily, coreg 25 mg twice daily and maxzid 37.5-25 daily. Pt is not on entresto. Pt was out of amlodipine for about 1.5 weeks. Just got new refill 2 days ago for amlodipine.  Bp is now better.  Cxr done last visit did not show pneumonia.       Observations/Objective:  General-no acute distress, pleasant, oriented. Lungs- on inspection lungs appear  unlabored. Neck- no tracheal deviation or jvd on inspection. Neuro- gross motor function appears intact.  Assessment and Plan:  History of COVID infection.  Symptomatic for 3 days.  Patient tested positive for COVID on Wednesday and got update today on positive test.  He got a confirmation PCR positive result on Thursday.  Patient does have COVID will score 5.  He has been vaccinated 3 times and had COVID once.  He was hospitalized for 5 days when he had COVID.  Patient not reporting any recent shortness of breath or wheezing.  Though he did report some mild shortness of breath on last visit.   His chest x-ray last time did not show any pneumonia or CHF flare.  Patient is currently on vitamin D and zinc.  On last visit I prescribed Flonase nasal spray, benzonatate cough tablets and refilled albuterol.  Went ahead and sent in azithromycin antibiotic today since some productive cough reported and some mild chest congestion.  Prescribed Paxlovid antiviral today.  Discussed emergency authorization use designation.  Discussed benefit versus risk.  Explained best to start within 5 days of symptom onset.  Approaching long holiday weekend.  Explained to patient to keep checking his O2 saturation levels.  If O2 sats 94% or less then advised to be evaluated in the emergency department.  Update me on Tuesday morning by MyChart and call.  Getting update on O2 saturation percentages and get general  update.  If still having moderate symptoms then would also try to arrange monoclonal antibodies/see if he qualifies.  Follow-up 1 week virtual visit or as needed.  Time spent with patient today was 33  minutes which consisted of chart revdiew, discussing diagnosis, work up, treatment and documentation.   Follow Up Instructions:    I discussed the assessment and treatment plan with the patient. The patient was provided an opportunity to ask questions and all were answered. The patient agreed with the plan  and demonstrated an understanding of the instructions.   The patient was advised to call back or seek an in-person evaluation if the symptoms worsen or if the condition fails to improve as anticipated.  Time spent with patient today was   minutes which consisted of chart revdiew, discussing diagnosis, work up treatment and documentation.   Mackie Pai, PA-C   Review of Systems  Constitutional: Positive for fatigue. Negative for chills and fever.  HENT: Negative for congestion and ear discharge.   Respiratory: Positive for cough. Negative for chest tightness, shortness of breath and wheezing.   Cardiovascular: Negative for chest pain and palpitations.  Gastrointestinal: Positive for nausea. Negative for abdominal distention, abdominal pain and vomiting.  Musculoskeletal: Negative for back pain and myalgias.  Skin: Negative for rash.  Neurological: Negative for dizziness, seizures, weakness and headaches.       Occasional dizziness.  Hematological: Negative for adenopathy. Does not bruise/bleed easily.  Psychiatric/Behavioral: Negative for agitation and confusion.    Past Medical History:  Diagnosis Date  . Back pain   . Bone spur    left foot  . Chest pain   . CHF (congestive heart failure) ([place])   . Constipation   . COVID-19   . Diabetes mellitus type 2 in obese ([place]) 07/13/2013  . Diverticulosis   . Dyspnea   . Erectile dysfunction 01/15/2013  . Esophageal reflux 01/15/2013  . Fatty liver 07/04/2015  . GERD (gastroesophageal reflux disease)   . Hiatal hernia   . Hiatal hernia with gastroesophageal reflux 03/20/2014  . Hyperglycemia 07/13/2013  . Hyperplastic colon polyp   . Hypertension   . Internal hemorrhoids   . Lower extremity edema   . Mixed hyperlipidemia   . Murmur   . OSA (obstructive sleep apnea)    s/p UPPP  . Plantar fasciitis   . Pneumonia due to COVID-19 virus   . Prediabetes   . Reflux esophagitis   . Shoulder pain   . Stomach ulcer    from PCP  .  Urinary hesitancy 09/30/2015  . Vitamin D deficiency      Social History   Socioeconomic History  . Marital status: Married    Spouse name: [name]  . Number of children: Not on file  . Years of education: Not on file  . Highest education level: Not on file  Occupational History  . Occupation: Nature conservation officer: [employer]  Tobacco Use  . Smoking status: Never Smoker  . Smokeless tobacco: Never Used  Vaping Use  . Vaping Use: Never used  Substance and Sexual Activity  . Alcohol use: No  . Drug use: No  . Sexual activity: Yes    Partners: Female  Other Topics Concern  . Not on file  Social History Narrative   Tobacco Use - No.    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 [place] STUDY N/A 02/28/2016   Procedure: [place] STUDY;  Surgeon: Jerene Bears, MD;  Location: [facility];  Service: Gastroenterology;  Laterality: N/A;  . ESOPHAGEAL MANOMETRY N/A 09/01/2013   Procedure: ESOPHAGEAL MANOMETRY (EM);  Surgeon: Sable Feil, MD;  Location: [facility];  Service: Endoscopy;  Laterality: N/A;  . ESOPHAGEAL MANOMETRY N/A 02/28/2016   Procedure: ESOPHAGEAL MANOMETRY (EM);  Surgeon: Jerene Bears, MD;  Location: [facility];  Service: Gastroenterology;  Laterality: N/A;  . FOOT TENDON TRANSFER Right   . INGUINAL HERNIA REPAIR    . TONSILLECTOMY    . UMBILICAL HERNIA REPAIR     . UVULECTOMY      Family History  Problem Relation Age of Onset  . Dementia Mother   . Diabetes Mother   . Hypertension Mother   . Heart failure Mother   . Stroke Mother   . Obesity Mother   . Hyperlipidemia Mother   . Heart disease Mother   . Depression Mother   . Hypertension Other        siblings  . Diabetes Sister   . Cancer Brother        lung cancer smoker  . Heart attack Maternal Grandmother   . Diabetes Sister   . Pancreatic disease Sister   . Obesity Father   . Alcoholism Father   . Colon cancer Neg Hx     Allergies  Allergen Reactions  . Hydrocodone Itching  . Tramadol Itching    Current Outpatient Medications on File Prior to Visit  Medication Sig Dispense Refill  . acetaminophen (TYLENOL) 500 MG tablet Take 1 tablet (500 mg total) by mouth every 6 (six) hours as needed. 60 tablet 0  . albuterol (VENTOLIN HFA) 108 (90 Base) MCG/ACT inhaler Inhale 2 puffs into the lungs every 4 (four) hours as needed for wheezing or shortness of breath. 18 g 1  . albuterol (VENTOLIN HFA) 108 (90 Base) MCG/ACT inhaler Inhale 2 puffs into the lungs every 6 (six) hours as needed. 18 g 0  . amLODipine (NORVASC) 10 MG tablet Take 1 tab every evening. 90 tablet 1  . ascorbic acid (VITAMIN C) 500 MG tablet Take 500 mg by mouth daily.    [name] aspirin EC 81 MG tablet Take 81 mg by mouth daily. Swallow whole.    Pleas Patricia Microlet Lancets lancets Test blood sugars twice daily 100 each 0  . benzonatate (TESSALON) 100 MG capsule Take 1 capsule (100 mg total) by mouth 3 (three) times daily as needed for cough. 30 capsule 0  . Blood Glucose Monitoring Suppl (ACCU-CHEK GUIDE ME) w/Device KIT     . Blood Glucose Monitoring Suppl (CONTOUR NEXT MONITOR) w/Device KIT 1 kit by Does not apply route 2 (two) times daily. 1 kit 0  . buPROPion [facility] SR) 150  MG 12 hr tablet Take 1 tablet by mouth once daily 30 tablet 0  . carvedilol (COREG) 25 MG tablet Take 1 tablet (25 mg total) by mouth 2 (two)  times daily with a meal. 60 tablet 3  . Cholecalciferol (VITAMIN D) 50 MCG (2000 UT) CAPS Take 1 capsule (2,000 Units total) by mouth daily. 90 capsule 0  . famotidine (PEPCID) 20 MG tablet Take 1 tablet (20 mg total) by mouth 2 (two) times daily. 60 tablet 3  . fluticasone (FLONASE) 50 MCG/ACT nasal spray Use 2 spray(s) in each nostril once daily 16 g 0  . glucose blood (CONTOUR NEXT TEST) test strip Test blood sugars twice daily 100 each 0  . ibuprofen (ADVIL) 600 MG tablet Take 1 tablet (600 mg total) by mouth every 8 (eight) hours as needed. 90 tablet 0  . losartan (COZAAR) 25 MG tablet Take 1 tablet (25 mg total) by mouth daily. 14 tablet 0  . melatonin 5 MG TABS Take 5 mg by mouth at bedtime.    . metFORMIN (GLUCOPHAGE) 500 MG tablet TAKE 1 TABLET BY MOUTH TWICE DAILY WITH A MEAL 60 tablet 0  . Misc. Devices (PULSE OXIMETER FOR FINGER) MISC 1 Device by Does not apply route as needed (SOB, fatigue, headache patient with COVID). 1 each 0  . Multiple Vitamins-Minerals (MULTIVITAMIN WITH MINERALS) tablet Take 1 tablet by mouth daily. 100 tablet 5  . pantoprazole (PROTONIX) 40 MG tablet Take 1 tablet (40 mg total) by mouth daily. 30 tablet 5  . rosuvastatin (CRESTOR) 40 MG tablet Take 0.5 tablets (20 mg total) by mouth daily. 30 tablet 3  . tadalafil (CIALIS) 5 MG tablet Take 1 tablet (5 mg total) by mouth daily as needed for erectile dysfunction. 30 tablet 0  . tamsulosin (FLOMAX) 0.4 MG CAPS capsule Take 1 capsule by mouth once daily 30 capsule 0  . tiZANidine (ZANAFLEX) 4 MG tablet Take 0.5-1 tablets (2-4 mg total) by mouth 2 (two) times daily as needed for muscle spasms. 40 tablet 0  . triamterene-hydrochlorothiazide (MAXZIDE-25) 37.5-25 MG tablet Take 1 tablet by mouth daily. 90 tablet 1  . Vitamin D, Ergocalciferol, (DRISDOL) 1.25 MG (50000 UNIT) CAPS capsule TAKE 1 CAPSULE BY MOUTH ONE TIME PER WEEK 12 capsule 0  . zinc gluconate 50 MG tablet Take 50 mg by mouth daily.    . [DISCONTINUED]  amitriptyline (ELAVIL) 25 MG tablet Take 1 tablet (25 mg total) by mouth at bedtime. (Patient not taking: Reported on 07/14/2019) 30 tablet 2   No current facility-administered medications on file prior to visit.    BP 116/68   Pulse 89   Temp (!) 97.4 F (36.3 C)   SpO2 97%      Objective:   Physical Exam  General Mental Status- Alert. General Appearance- Not in acute distress.   Skin General: Color- Normal Color. Moisture- Normal Moisture.  Neck Carotid Arteries- Normal color. Moisture- Normal Moisture. No carotid bruits. No JVD.  Chest and Lung Exam Auscultation: Breath Sounds:-Normal.  Cardiovascular Auscultation:Rythm- Regular. Murmurs & Other Heart Sounds:Auscultation of the heart reveals- No Murmurs.  Abdomen Inspection:-Inspeection Normal. Palpation/Percussion:Note:No mass. Palpation and Percussion of the abdomen reveal- Non Tender, Non Distended + BS, no rebound or guarding.   Neurologic Cranial Nerve exam:- CN III-XII intact(No nystagmus), symmetric smile. Strength:- 5/5 equal and symmetric strength both upper and lower extremities.        Assessment & Plan:

## 2021-02-04 NOTE — Telephone Encounter (Signed)
Patient calling back to request for meds to be sent to [facility] at [place].

## 2021-02-04 NOTE — Telephone Encounter (Signed)
Rx paxlovid(covid antiviral med) sent to pt pharmacy. Advise start today. Best to start within 5 days of symptoms onset. Note there is med interaction. Paxlovid can reduce concentration of wellbutrin while on med. Just wanted to make aware.  Make sure med was sent to pt pharmacy and did not print.

## 2021-02-04 NOTE — Telephone Encounter (Signed)
Elizabeth pharmacist from [facility] was unable to fill the medication. In order to qualify for medication, they need a positive test note in the chart, a GFR blood test, and a qualifying condition. After all, that patient has a drug interaction

## 2021-02-04 NOTE — Telephone Encounter (Signed)
Wife called back, stating [facility] does not require blood work. She states her husband is too sick to go out.

## 2021-02-04 NOTE — Telephone Encounter (Signed)
Patient calling back to request for meds to be sent to [facility] at [place].

## 2021-02-04 NOTE — Patient Instructions (Signed)
History of COVID infection.  Symptomatic for 3 days.  Patient tested positive for COVID on Wednesday and got update today on positive test.  He got a confirmation PCR positive result on Thursday.  Patient does have COVID will score 5.  He has been vaccinated 3 times and had COVID once.  He was hospitalized for 5 days when he had COVID.  Patient not reporting any recent shortness of breath or wheezing.  Though he did report some mild shortness of breath on last visit.   His chest x-ray last time did not show any pneumonia or CHF flare.  Patient is currently on vitamin D and zinc.  On last visit I prescribed Flonase nasal spray, benzonatate cough tablets and refilled albuterol.  Went ahead and sent in azithromycin antibiotic today since some productive cough reported and some mild chest congestion.  Prescribed Paxlovid antiviral today.  Discussed emergency authorization use designation.  Discussed benefit versus risk.  Explained best to start within 5 days of symptom onset.  Approaching long holiday weekend.  Explained to patient to keep checking his O2 saturation levels.  If O2 sats 94% or less then advised to be evaluated in the emergency department.  Update me on Tuesday morning by MyChart and call.  Getting update on O2 saturation percentages and give  general update.  If still having moderate symptoms then would also try to arrange monoclonal antibodies/see if he qualifies.  Follow-up 1 week virtual visit or as needed.

## 2021-02-04 NOTE — Telephone Encounter (Signed)
Spoke with pt about medication, and stated he would still like to keep VV for today .

## 2021-02-11 ENCOUNTER — Telehealth (INDEPENDENT_AMBULATORY_CARE_PROVIDER_SITE_OTHER): Payer: No Typology Code available for payment source | Admitting: Medical

## 2021-02-11 ENCOUNTER — Other Ambulatory Visit: Payer: Self-pay

## 2021-02-11 ENCOUNTER — Encounter: Payer: Self-pay | Admitting: Medical

## 2021-02-11 VITALS — BP 141/90

## 2021-02-11 DIAGNOSIS — U071 COVID-19: Secondary | ICD-10-CM | POA: Diagnosis not present

## 2021-02-11 DIAGNOSIS — I10 Essential (primary) hypertension: Secondary | ICD-10-CM | POA: Diagnosis not present

## 2021-02-11 NOTE — Progress Notes (Signed)
   Subjective:    Patient ID: [name] Foster, male    DOB: [birth year], 55 y.o.   MRN: [id number]  HPI Virtual Visit via Video Note  I connected with Dionel E Tomaro on 02/11/21 at 10:40 AM EDT by a video enabled telemedicine application and verified that I am speaking with the correct person using two identifiers.  Location: Patient: home Provider: office.   I discussed the limitations of evaluation and management by telemedicine and the availability of in person appointments. The patient expressed understanding and agreed to proceed.  History of Present Illness: Pt states he has random occasional cough and runny nose. Pt took over the counter test about 1 hour ago and tested negative. Pt has flonase and tessalon perles.  Pt tested + on 02-03-2021.    On review pt did take the paxlovid. He states he did not finish full course due to bad taste in mouth.  Pt is doing well with losartan 25 mg daily. In addition to other meds. Pt is not working. Most days since starting 130/70-80. Was functional assessment done but when he went to PT/OT in past bp were too high.   Observations/Objective:   General-no acute distress, pleasant, oriented. Lungs- on inspection lungs appear unlabored. Neck- no tracheal deviation or jvd on inspection. Neuro- gross motor function appears intact.  Assessment and Plan: Recovered from covid/residual type symptoms presently.  Recommend you can continue Flonase for a week or so and use Tessalon Perles for residual rare cough.  If signs and symptoms worsen or change notify us.  Blood pressure has been elevated and you had very good response with adding of losartan 25 mg daily.  However your blood pressure today was a little bit high.  You need form filled out in order to do functional assessment.  Please send the form over by MyChart so I can review that.  Also check your blood pressure later today and tomorrow morning again.  Might recommend increasing the losartan to  50 mg daily.  Depends on your repeat BP readings.  Will make decision on if able to fill out form after reviewing the form and your BP levels.  Follow-up as regular scheduled with PCP.  Follow Up Instructions:    I discussed the assessment and treatment plan with the patient. The patient was provided an opportunity to ask questions and all were answered. The patient agreed with the plan and demonstrated an understanding of the instructions.   The patient was advised to call back or seek an in-person evaluation if the symptoms worsen or if the condition fails to improve as anticipated.  Time spent with patient today was 24 minutes which consisted of chart revdiew, discussing diagnosis, work up treatment and documentation.   Mackie Pai, PA-C    Review of Systems  Constitutional: Negative for chills, fatigue and fever.  HENT:       Mild runny nose and faint nasal congestion.  Respiratory: Positive for cough. Negative for chest tightness, shortness of breath and wheezing.        Rare/random.  Cardiovascular: Negative for chest pain and palpitations.  Gastrointestinal: Negative for abdominal pain.  Genitourinary: Negative for dysuria.  Musculoskeletal: Negative for back pain.  Neurological: Negative for dizziness and headaches.  Hematological: Negative for adenopathy. Does not bruise/bleed easily.  Psychiatric/Behavioral: Negative for behavioral problems and decreased concentration.       Objective:   Physical Exam        Assessment & Plan:

## 2021-02-11 NOTE — Patient Instructions (Addendum)
Recovered from covid/residual type symptoms presently.  Recommend you can continue Flonase for a week or so and use Tessalon Perles for residual rare cough.  If signs and symptoms worsen or change notify us.  Blood pressure has been elevated and you had very good response with adding of losartan 25 mg daily.  However your blood pressure today was a little bit high.  You need form filled out in order to do functional assessment.  Please send the form over by MyChart so I can review that.  Also check your blood pressure later today and tomorrow morning again.  Might recommend increasing the losartan to 50 mg daily.  Depends on your repeat BP readings.  Will make decision on if able to fill out form after reviewing the form and your BP levels.  Follow-up as regular scheduled with PCP.

## 2021-02-14 ENCOUNTER — Ambulatory Visit: Payer: No Typology Code available for payment source | Admitting: Podiatry

## 2021-03-04 ENCOUNTER — Telehealth: Payer: Self-pay | Admitting: Family Medicine

## 2021-03-04 NOTE — Telephone Encounter (Signed)
Called pt to see if we could go over long term disability paperwork. Questions like: When did he return to work? Can he come in for appointment to discuss paperwork with Percell Miller?

## 2021-03-04 NOTE — Telephone Encounter (Signed)
Forms faxed into front office Placed into blyth folder up front

## 2021-03-08 ENCOUNTER — Other Ambulatory Visit: Payer: Self-pay | Admitting: Family Medicine

## 2021-03-08 ENCOUNTER — Other Ambulatory Visit: Payer: Self-pay | Admitting: Medical

## 2021-03-08 DIAGNOSIS — N529 Male erectile dysfunction, unspecified: Secondary | ICD-10-CM

## 2021-03-08 NOTE — Telephone Encounter (Signed)
Left message on machine for patient to call back to schedule appointment.  Paperwork on [employer].

## 2021-03-09 ENCOUNTER — Other Ambulatory Visit: Payer: Self-pay | Admitting: *Deleted

## 2021-03-09 MED ORDER — AMLODIPINE BESYLATE 10 MG PO TABS: ORAL_TABLET | ORAL | 1 refills | Status: DC

## 2021-03-22 ENCOUNTER — Other Ambulatory Visit: Payer: Self-pay

## 2021-03-22 ENCOUNTER — Ambulatory Visit: Payer: No Typology Code available for payment source

## 2021-03-22 DIAGNOSIS — G4733 Obstructive sleep apnea (adult) (pediatric): Secondary | ICD-10-CM

## 2021-03-24 ENCOUNTER — Ambulatory Visit (INDEPENDENT_AMBULATORY_CARE_PROVIDER_SITE_OTHER): Payer: No Typology Code available for payment source | Admitting: Podiatry

## 2021-03-24 ENCOUNTER — Encounter: Payer: Self-pay | Admitting: Podiatry

## 2021-03-24 ENCOUNTER — Other Ambulatory Visit: Payer: Self-pay

## 2021-03-24 DIAGNOSIS — L600 Ingrowing nail: Secondary | ICD-10-CM

## 2021-03-24 DIAGNOSIS — M722 Plantar fascial fibromatosis: Secondary | ICD-10-CM

## 2021-03-24 NOTE — Patient Instructions (Signed)

## 2021-03-25 ENCOUNTER — Encounter: Payer: Self-pay | Admitting: Family Medicine

## 2021-03-25 ENCOUNTER — Ambulatory Visit (INDEPENDENT_AMBULATORY_CARE_PROVIDER_SITE_OTHER): Payer: No Typology Code available for payment source | Admitting: Family Medicine

## 2021-03-25 ENCOUNTER — Other Ambulatory Visit: Payer: Self-pay | Admitting: Family Medicine

## 2021-03-25 DIAGNOSIS — M25512 Pain in left shoulder: Secondary | ICD-10-CM | POA: Diagnosis not present

## 2021-03-25 DIAGNOSIS — G8929 Other chronic pain: Secondary | ICD-10-CM | POA: Diagnosis not present

## 2021-03-25 DIAGNOSIS — M25511 Pain in right shoulder: Secondary | ICD-10-CM

## 2021-03-25 DIAGNOSIS — M25522 Pain in left elbow: Secondary | ICD-10-CM

## 2021-03-25 NOTE — Progress Notes (Signed)
Office Visit Note   Patient: [name] Foster           Date of Birth: Aug 21, 1966           MRN: [id number] Visit Date: 03/25/2021 Requested by: [name], MD [address] [place],  [place] [address] PCP: [name], MD  Subjective: Chief Complaint  Patient presents with   Right Shoulder - Pain    Pain flared up in the shoulder again at least 2 weeks ago - no new injury, incident. No problems with ROM. Cannot lie on the right side. Wakes up due to pain. He said he is hoping to have surgery on the shoulder in October, when his wife will be eligible for FMLA.     HPI: He is here with right greater than left shoulder pain.  For a while he did well, not pain-free but symptoms were manageable and then in the past month his floaters have begun hurting quite a bit.  Pain when lying on his sides, pain wakes him up at night.  He is contemplating having surgery in October if possible.  His left elbow has been bothering him for about 5 or 6 months.  He recalls hitting it at 1 point and going to the urgent care, x-rays were obtained and were negative for acute fracture according to patient.  He thinks he had an injection at 1 point.  It hurts when he puts pressure on it, it sometimes feels like it is popping.  He feels like something is wrong on the inside.                ROS:   All other systems were reviewed and are negative.  Objective: Vital Signs: There were no vitals taken for this visit.  Physical Exam:  General:  Alert and oriented, in no acute distress. Pulm:  Breathing unlabored. Psy:  Normal mood, congruent affect.  Shoulders: Full active range of motion, pain at the extremes of overhead reach and behind the back reach.  Rotator cuff strength remains 5/5 throughout. Left elbow: No detectable effusion.  Full extension, full forearm pronation and supination.  I cannot completely reproduce his pain by palpation, but he points to the posterior lateral aspect of the  elbow.  No pain with wrist extension or flexion.    Imaging: No results found.  Assessment & Plan: Chronic bilateral shoulder pain -Discussed with patient, he wants injections today.  He will then contact Dr. Erlinda Hong when he is ready to schedule surgery.  2.  Chronic left elbow pain -MRI to rule out loose body.     Procedures: Bilateral subacromial injections: After sterile prep with Betadine, injected 3 cc 1% lidocaine without epinephrine and 40 mg of Depo-Medrol from posterior approach.       PMFS History: Patient Active Problem List   Diagnosis Date Noted   Chronic bilateral low back pain without sciatica 08/12/2020   Superior labrum anterior-to-posterior (SLAP) tear of right shoulder 04/27/2020   Chronic right shoulder pain 03/31/2020   Chronic left shoulder pain 03/31/2020   Urinary frequency 12/10/2019   Dysuria 12/08/2019   Acute respiratory failure with hypoxia ([place]) 07/24/2019   Pneumonia due to COVID-19 virus 07/23/2019   Bilateral inguinal hernia without obstruction or gangrene 62/69/4854   Umbilical hernia without obstruction and without gangrene 04/11/2017   Vitamin D deficiency 01/24/2017   Type 2 diabetes mellitus without complication, without long-term current use of insulin ([place]) 01/24/2017  Depression 01/24/2017   Shortness of breath on exertion 11/27/2016   Internal hemorrhoids    Atypical chest pain    Gastroesophageal reflux disease without esophagitis    Preventative health care 10/10/2015   Sleep apnea 10/10/2015   Urinary hesitancy 09/30/2015   Fatty liver 07/04/2015   Edema 05/07/2014   Scleroderma of esophagus ([place]) 03/20/2014   Hiatal hernia with gastroesophageal reflux 03/20/2014   Diabetes mellitus type 2 in obese ([place]) 07/13/2013   Anxiety state 07/13/2013   Erectile dysfunction 01/15/2013   Back pain 03/09/2012   Mesenteric panniculitis ([place]) 02/06/2012   Heel pain, bilateral 04/25/2010   Obesity 01/05/2010   Hyperlipidemia  10/04/2009   Essential hypertension 10/04/2009   Past Medical History:  Diagnosis Date   Back pain    Bone spur    left foot   Chest pain    CHF (congestive heart failure) ([name])    Constipation    COVID-19    Diabetes mellitus type 2 in obese ([place]) 07/13/2013   Diverticulosis    Dyspnea    Erectile dysfunction 01/15/2013   Esophageal reflux 01/15/2013   Fatty liver 07/04/2015   GERD (gastroesophageal reflux disease)    Hiatal hernia    Hiatal hernia with gastroesophageal reflux 03/20/2014   Hyperglycemia 07/13/2013   Hyperplastic colon polyp    Hypertension    Internal hemorrhoids    Lower extremity edema    Mixed hyperlipidemia    Murmur    OSA (obstructive sleep apnea)    s/p UPPP   Plantar fasciitis    Pneumonia due to COVID-19 virus    Prediabetes    Reflux esophagitis    Shoulder pain    Stomach ulcer    from PCP   Urinary hesitancy 09/30/2015   Vitamin D deficiency     Family History  Problem Relation Age of Onset   Dementia Mother    Diabetes Mother    Hypertension Mother    Heart failure Mother    Stroke Mother    Obesity Mother    Hyperlipidemia Mother    Heart disease Mother    Depression Mother    Hypertension Other        siblings   Diabetes Sister    Cancer Brother        lung cancer smoker   Heart attack Maternal Grandmother    Diabetes Sister    Pancreatic disease Sister    Obesity Father    Alcoholism Father    Colon cancer Neg Hx     Past Surgical History:  Procedure Laterality Date   4 HOUR [place] STUDY N/A 02/28/2016   Procedure: 24 HOUR [place] STUDY;  Surgeon: Jerene Bears, MD;  Location: [facility];  Service: Gastroenterology;  Laterality: N/A;   ESOPHAGEAL MANOMETRY N/A 09/01/2013   Procedure: ESOPHAGEAL MANOMETRY (EM);  Surgeon: Sable Feil, MD;  Location: [facility];  Service: Endoscopy;  Laterality: N/A;   ESOPHAGEAL MANOMETRY N/A 02/28/2016   Procedure: ESOPHAGEAL MANOMETRY (EM);  Surgeon: Jerene Bears, MD;  Location: [facility];  Service: Gastroenterology;  Laterality: N/A;   FOOT TENDON TRANSFER Right    INGUINAL HERNIA REPAIR     TONSILLECTOMY     UMBILICAL HERNIA REPAIR     UVULECTOMY     Social History   Occupational History   Occupation: Nature conservation officer: [employer]  Tobacco Use   Smoking status: Never   Smokeless tobacco: Never  Vaping Use   Vaping Use:  Never used  Substance and Sexual Activity   Alcohol use: No   Drug use: No   Sexual activity: Yes    Partners: Female

## 2021-03-25 NOTE — Progress Notes (Signed)
Sending for [employer].

## 2021-03-27 NOTE — Progress Notes (Signed)
Subjective:   Patient ID: [name] Foster, male   DOB: 55 y.o.   MRN: [id number]   HPI Patient states the back of his right heel is doing fine after surgery but he continues to have residual pain that become more intense plantar aspect left heel   ROS      Objective:  Physical Exam  Neurovascular status intact moderate equinus condition noted left well-healed surgical site posterior right with exquisite discomfort plantar heel left at insertion calcaneus     Assessment:  Well-healed Achilles surgery right with chronic Planter fasciitis left H     Plan:  NP reviewed condition and at this point sterile prep injected the fascia 3 mg Kenalog 5 mg Xylocaine at the insertion discussed immobilization with boot and possibility for surgery if symptoms were to persist.  Reappoint 4 weeks or earlier if needed

## 2021-03-29 ENCOUNTER — Ambulatory Visit (INDEPENDENT_AMBULATORY_CARE_PROVIDER_SITE_OTHER): Payer: No Typology Code available for payment source | Admitting: Family Medicine

## 2021-03-29 ENCOUNTER — Encounter: Payer: Self-pay | Admitting: Family Medicine

## 2021-03-29 ENCOUNTER — Other Ambulatory Visit: Payer: Self-pay

## 2021-03-29 VITALS — BP 132/88 | HR 81 | Temp 98.1°F | Resp 16 | Wt 271.6 lb

## 2021-03-29 DIAGNOSIS — E559 Vitamin D deficiency, unspecified: Secondary | ICD-10-CM

## 2021-03-29 DIAGNOSIS — E785 Hyperlipidemia, unspecified: Secondary | ICD-10-CM | POA: Diagnosis not present

## 2021-03-29 DIAGNOSIS — E119 Type 2 diabetes mellitus without complications: Secondary | ICD-10-CM

## 2021-03-29 DIAGNOSIS — G8929 Other chronic pain: Secondary | ICD-10-CM

## 2021-03-29 DIAGNOSIS — E669 Obesity, unspecified: Secondary | ICD-10-CM

## 2021-03-29 DIAGNOSIS — M79672 Pain in left foot: Secondary | ICD-10-CM

## 2021-03-29 DIAGNOSIS — I10 Essential (primary) hypertension: Secondary | ICD-10-CM | POA: Diagnosis not present

## 2021-03-29 DIAGNOSIS — M79671 Pain in right foot: Secondary | ICD-10-CM

## 2021-03-29 DIAGNOSIS — E1169 Type 2 diabetes mellitus with other specified complication: Secondary | ICD-10-CM

## 2021-03-29 DIAGNOSIS — M25511 Pain in right shoulder: Secondary | ICD-10-CM

## 2021-03-29 NOTE — Assessment & Plan Note (Signed)
Well controlled, no changes to meds. Encouraged heart healthy diet such as the DASH diet and exercise as tolerated.  °

## 2021-03-29 NOTE — Assessment & Plan Note (Signed)
Has had shoulder pain, dysfunction and decreased ROM since his injury at work 3 years ago. 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

## 2021-03-29 NOTE — Patient Instructions (Signed)

## 2021-03-29 NOTE — Assessment & Plan Note (Signed)
hgba1c acceptable, minimize simple carbs. Increase exercise as tolerated. Continue current meds 

## 2021-03-29 NOTE — Assessment & Plan Note (Signed)
Is following with Dr Paulla Dolly at podiatry and he has had surgery on right foot and now he is having left foot pain now as well and he is in a immobility boot in both feet. His feet have limited his balance mobility and function, this has limited his ability to return to work

## 2021-03-29 NOTE — Assessment & Plan Note (Signed)
Supplement and monitor 

## 2021-03-29 NOTE — Progress Notes (Signed)
Patient ID: [name] Foster, male    DOB: September 26, 1965  Age: 55 y.o. MRN: [id number]    Subjective:  Subjective  HPI Harrol E Punches presents for office visit today for follow up on htn and type 2 diabetes. He reports that he has had right toenail removal surgery on 03/24/2021 by Dr. Paulla Dolly. 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 He states that initially he attempted to go back to work after injury, but realized that injury was serious and has been struggling for 3 years now. He reports that he receives injections for his right shoulder by Dr. Junius Roads and plans on getting surgery on his right shoulder soon. He reports that he has also developed plantar fasciitis which is causing him great pain in his left foot. Denies CP/palp/SOB/HA/congestion/fevers/GI or GU c/o. Taking meds as prescribed.  Pain in both fingers and hands has been improving, however he now experiences bilateral wrist pain especially when doing physical activities that involve wrists. At the moment, he cannot resume work full time and is seeking compensation for injuries and pay for missed days to be able to survive until recovery.   Review of Systems  Constitutional:  Negative for chills, fatigue and fever.  HENT:  Negative for congestion, rhinorrhea, sinus pressure, sinus pain and sore throat.   Eyes:  Negative for pain.  Respiratory:  Negative for cough and shortness of breath.   Cardiovascular:  Negative for chest pain, palpitations and leg swelling.  Gastrointestinal:  Negative for abdominal pain, blood in stool, diarrhea, nausea and vomiting.  Genitourinary:  Negative for flank pain, frequency and penile pain.  Musculoskeletal:  Positive for back pain.  Neurological:  Negative for headaches.   History Past Medical History:  Diagnosis Date   Back pain    Bone spur    left foot   Chest pain    CHF (congestive heart failure) ([place])    Constipation     COVID-19    Diabetes mellitus type 2 in obese ([place]) 07/13/2013   Diverticulosis    Dyspnea    Erectile dysfunction 01/15/2013   Esophageal reflux 01/15/2013   Fatty liver 07/04/2015   GERD (gastroesophageal reflux disease)    Hiatal hernia    Hiatal hernia with gastroesophageal reflux 03/20/2014   Hyperglycemia 07/13/2013   Hyperplastic colon polyp    Hypertension    Internal hemorrhoids    Lower extremity edema    Mixed hyperlipidemia    Murmur    OSA (obstructive sleep apnea)    s/p UPPP   Plantar fasciitis    Pneumonia due to COVID-19 virus    Prediabetes    Reflux esophagitis    Shoulder pain    Stomach ulcer    from PCP   Urinary hesitancy 09/30/2015   Vitamin D deficiency     He has a past surgical history that includes Tonsillectomy; Uvulectomy; Esophageal manometry (N/A, 09/01/2013); Esophageal manometry (N/A, 02/28/2016); 24 hour ph study (N/A, 02/28/2016); Umbilical hernia repair; Inguinal hernia repair; and Foot tendon transfer (Right).   His family history includes Alcoholism in his father; Cancer in his brother; Dementia in his mother; Depression in his mother; Diabetes in his mother, sister, and sister; Heart attack in his maternal grandmother; Heart disease in his mother; Heart failure in his mother; Hyperlipidemia in his mother; Hypertension in his mother and another family member; Obesity in his father and mother; Pancreatic disease in his  sister; Stroke in his mother.He reports that he has never smoked. He has never used smokeless tobacco. He reports that he does not drink alcohol and does not use drugs.  Current Outpatient Medications on File Prior to Visit  Medication Sig Dispense Refill   acetaminophen (TYLENOL) 500 MG tablet Take 1 tablet (500 mg total) by mouth every 6 (six) hours as needed. 60 tablet 0   albuterol (VENTOLIN HFA) 108 (90 Base) MCG/ACT inhaler Inhale 2 puffs into the lungs every 4 (four) hours as needed for wheezing or shortness of breath. 18 g 1    albuterol (VENTOLIN HFA) 108 (90 Base) MCG/ACT inhaler Inhale 2 puffs into the lungs every 6 (six) hours as needed. 18 g 0   amLODipine (NORVASC) 10 MG tablet Take 1 tab every evening. 90 tablet 1   ascorbic acid (VITAMIN C) 500 MG tablet Take 500 mg by mouth daily.     aspirin EC 81 MG tablet Take 81 mg by mouth daily. Swallow whole.     Bayer Microlet Lancets lancets Test blood sugars twice daily 100 each 0   benzonatate (TESSALON) 100 MG capsule Take 1 capsule (100 mg total) by mouth 3 (three) times daily as needed for cough. 30 capsule 0   Blood Glucose Monitoring Suppl (ACCU-CHEK GUIDE ME) w/Device KIT      Blood Glucose Monitoring Suppl (CONTOUR NEXT MONITOR) w/Device KIT 1 kit by Does not apply route 2 (two) times daily. 1 kit 0   buPROPion (WELLBUTRIN SR) 150 MG 12 hr tablet Take 1 tablet by mouth once daily 30 tablet 0   carvedilol (COREG) 25 MG tablet Take 1 tablet (25 mg total) by mouth 2 (two) times daily with a meal. 60 tablet 3   Cholecalciferol (VITAMIN D) 50 MCG (2000 UT) CAPS Take 1 capsule (2,000 Units total) by mouth daily. 90 capsule 0   famotidine (PEPCID) 20 MG tablet Take 1 tablet (20 mg total) by mouth 2 (two) times daily. 60 tablet 3   fluticasone (FLONASE) 50 MCG/ACT nasal spray Use 2 spray(s) in each nostril once daily 16 g 0   glucose blood (CONTOUR NEXT TEST) test strip Test blood sugars twice daily 100 each 0   ibuprofen (ADVIL) 600 MG tablet Take 1 tablet (600 mg total) by mouth every 8 (eight) hours as needed. 90 tablet 0   losartan (COZAAR) 25 MG tablet Take 1 tablet (25 mg total) by mouth daily. 14 tablet 0   melatonin 5 MG TABS Take 5 mg by mouth at bedtime.     metFORMIN (GLUCOPHAGE) 500 MG tablet TAKE 1 TABLET BY MOUTH TWICE DAILY WITH A MEAL 60 tablet 0   Misc. Devices (PULSE OXIMETER FOR FINGER) MISC 1 Device by Does not apply route as needed (SOB, fatigue, headache patient with COVID). 1 each 0   Multiple Vitamins-Minerals (MULTIVITAMIN WITH MINERALS)  tablet Take 1 tablet by mouth daily. 100 tablet 5   pantoprazole (PROTONIX) 40 MG tablet Take 1 tablet (40 mg total) by mouth daily. 30 tablet 5   rosuvastatin (CRESTOR) 40 MG tablet Take 0.5 tablets (20 mg total) by mouth daily. 30 tablet 3   tadalafil (CIALIS) 5 MG tablet Take 1 tablet (5 mg total) by mouth daily as needed for erectile dysfunction. 30 tablet 0   tamsulosin (FLOMAX) 0.4 MG CAPS capsule Take 1 capsule by mouth once daily 30 capsule 0   tiZANidine (ZANAFLEX) 4 MG tablet Take 0.5-1 tablets (2-4 mg total) by mouth 2 (two) times daily  as needed for muscle spasms. 40 tablet 0   triamterene-hydrochlorothiazide (MAXZIDE-25) 37.5-25 MG tablet Take 1 tablet by mouth daily. 90 tablet 1   Vitamin D, Ergocalciferol, (DRISDOL) 1.25 MG (50000 UNIT) CAPS capsule TAKE 1 CAPSULE BY MOUTH ONE TIME PER WEEK 12 capsule 0   zinc gluconate 50 MG tablet Take 50 mg by mouth daily.     [DISCONTINUED] amitriptyline (ELAVIL) 25 MG tablet Take 1 tablet (25 mg total) by mouth at bedtime. (Patient not taking: Reported on 07/14/2019) 30 tablet 2   No current facility-administered medications on file prior to visit.     Objective:  Objective  Physical Exam Constitutional:      General: He is not in acute distress.    Appearance: Normal appearance. He is not ill-appearing or toxic-appearing.  HENT:     Head: Normocephalic and atraumatic.     Right Ear: Tympanic membrane, ear canal and external ear normal.     Left Ear: Tympanic membrane, ear canal and external ear normal.     Nose: No congestion or rhinorrhea.  Eyes:     Extraocular Movements: Extraocular movements intact.     Pupils: Pupils are equal, round, and reactive to light.  Cardiovascular:     Rate and Rhythm: Normal rate and regular rhythm.     Pulses: Normal pulses.     Heart sounds: Normal heart sounds. No murmur heard. Pulmonary:     Effort: Pulmonary effort is normal. No respiratory distress.     Breath sounds: Normal breath sounds. No  wheezing, rhonchi or rales.  Abdominal:     General: Bowel sounds are normal.     Palpations: Abdomen is soft. There is no mass.     Tenderness: no abdominal tenderness There is no guarding.     Hernia: No hernia is present.  Musculoskeletal:        General: Normal range of motion.     Cervical back: Normal range of motion and neck supple.  Skin:    General: Skin is warm and dry.  Neurological:     Mental Status: He is alert and oriented to person, place, and time.  Psychiatric:        Behavior: Behavior normal.   BP 132/88   Pulse 81   Temp 98.1 F (36.7 C)   Resp 16   Wt 271 lb 9.6 oz (123.2 kg)   SpO2 98%   BMI 36.84 kg/m  Wt Readings from Last 3 Encounters:  03/29/21 271 lb 9.6 oz (123.2 kg)  12/15/20 272 lb 12.8 oz (123.7 kg)  11/12/20 276 lb (125.2 kg)     Lab Results  Component Value Date   WBC 5.4 12/16/2019   HGB 17.4 12/16/2019   HCT 51.7 (H) 12/16/2019   PLT 233 12/16/2019   GLUCOSE 82 08/12/2020   CHOL 195 09/16/2020   TRIG 111.0 09/16/2020   HDL 40.60 09/16/2020   LDLDIRECT 181.0 08/12/2020   LDLCALC 132 (H) 09/16/2020   ALT 38 08/12/2020   AST 27 08/12/2020   NA 140 08/12/2020   K 4.1 08/12/2020   CL 103 08/12/2020   CREATININE 0.96 08/12/2020   BUN 12 08/12/2020   CO2 26 08/12/2020   TSH 1.880 12/16/2019   PSA 3.64 12/08/2019   HGBA1C 6.2 08/12/2020   MICROALBUR 4.1 (H) 08/12/2020    DG Chest 2 View  Result Date: 02/02/2021 CLINICAL DATA:  Cough and dyspnea. EXAM: CHEST - 2 VIEW COMPARISON:  10/22/2019 FINDINGS: The heart size and mediastinal  contours are within normal limits. Both lungs are clear. The visualized skeletal structures are unremarkable. IMPRESSION: No active cardiopulmonary disease. Electronically Signed   By: Franchot Gallo M.D.   On: 02/02/2021 11:14     Assessment & Plan:  Plan    No orders of the defined types were placed in this encounter.   Problem List Items Addressed This Visit     Hyperlipidemia - Primary  (Chronic)   Relevant Orders   Lipid panel   Essential hypertension (Chronic)    Well controlled, no changes to meds. Encouraged heart healthy diet such as the [name] diet and exercise as tolerated.        Relevant Orders   CBC   Comprehensive metabolic panel   TSH   Diabetes mellitus type 2 in obese ([name])    hgba1c acceptable, minimize simple carbs. Increase exercise as tolerated. Continue current meds       Bilateral foot pain    Is following with Dr Paulla Dolly at podiatry and he has had surgery on right foot and now he is having left foot pain now as well and he is in a immobility boot in both feet. His feet have limited his balance mobility and function, this has limited his ability to return to work       Vitamin D deficiency    Supplement and monitor       Relevant Orders   VITAMIN D 25 Hydroxy (Vit-D Deficiency, Fractures)   Chronic right shoulder pain    Has had shoulder pain, dysfunction and decreased ROM since his injury at work 3 years ago. 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 Increase exercise as tolerated. Continue current meds       Relevant Orders   Hemoglobin A1c    Follow-up: Return in about 3 months (around 06/29/2021).  I, Suezanne Jacquet, acting as a scribe for Penni Homans, MD, have documented all relevent documentation on behalf of Penni Homans, MD, as directed by Penni Homans, MD while in the presence of Penni Homans, MD.  I, [name], MD personally performed the services described in this documentation. All medical record entries made by the scribe were at my direction and in my presence. I have reviewed the chart and agree that the record reflects my personal performance and is accurate and complete

## 2021-03-30 ENCOUNTER — Telehealth: Payer: Self-pay | Admitting: Family Medicine

## 2021-03-30 LAB — COMPREHENSIVE METABOLIC PANEL
ALT: 31 U/L (ref 0–53)
AST: 19 U/L (ref 0–37)
Albumin: 4.7 g/dL (ref 3.5–5.2)
Alkaline Phosphatase: 68 U/L (ref 39–117)
BUN: 18 mg/dL (ref 6–23)
CO2: 26 mEq/L (ref 19–32)
Calcium: 9.8 mg/dL (ref 8.4–10.5)
Chloride: 103 mEq/L (ref 96–112)
Creatinine, Ser: 0.99 mg/dL (ref 0.40–1.50)
GFR: 87.02 mL/min (ref >60.00)
Glucose, Bld: 84 mg/dL (ref 70–99)
Potassium: 4 mEq/L (ref 3.5–5.1)
Sodium: 139 mEq/L (ref 135–145)
Total Bilirubin: 0.4 mg/dL (ref 0.2–1.2)
Total Protein: 7.7 g/dL (ref 6.0–8.3)

## 2021-03-30 LAB — CBC
HCT: 45.8 % (ref 39.0–52.0)
Hemoglobin: 15.5 g/dL (ref 13.0–17.0)
MCHC: 33.8 g/dL (ref 30.0–36.0)
MCV: 87 fl (ref 78.0–100.0)
Platelets: 235 10*3/uL (ref 150.0–400.0)
RBC: 5.27 Mil/uL (ref 4.22–5.81)
RDW: 14.9 % (ref 11.5–15.5)
WBC: 7.1 10*3/uL (ref 4.0–10.5)

## 2021-03-30 LAB — LIPID PANEL
Cholesterol: 204 mg/dL — ABNORMAL HIGH (ref 0–200)
HDL: 55.9 mg/dL (ref >39.00)
LDL Cholesterol: 133 mg/dL — ABNORMAL HIGH (ref 0–99)
NonHDL: 148.1
Total CHOL/HDL Ratio: 4
Triglycerides: 78 mg/dL (ref 0.0–149.0)
VLDL: 15.6 mg/dL (ref 0.0–40.0)

## 2021-03-30 LAB — VITAMIN D 25 HYDROXY (VIT D DEFICIENCY, FRACTURES): VITD: 36.03 ng/mL (ref 30.00–100.00)

## 2021-03-30 LAB — TSH: TSH: 4.09 u[IU]/mL (ref 0.35–5.50)

## 2021-03-30 LAB — HEMOGLOBIN A1C: Hgb A1c MFr Bld: 6.7 % — ABNORMAL HIGH (ref 4.6–6.5)

## 2021-03-30 NOTE — Telephone Encounter (Signed)
Paperwork faxed into front office  Placed into blyths bin up front

## 2021-03-31 MED ORDER — ROSUVASTATIN CALCIUM 40 MG PO TABS: 40.0000 mg | ORAL_TABLET | Freq: Every day | ORAL | 1 refills | Status: DC

## 2021-03-31 NOTE — Addendum Note (Signed)
Addended by: Wynonia Musty A on: 03/31/2021 11:47 AM   Modules accepted: Orders

## 2021-04-01 NOTE — Telephone Encounter (Signed)
Paperwork completed and faxed on 03/31/21.  Patient notified that paperwork had been faxed and that copy has been mailed to him.

## 2021-04-02 ENCOUNTER — Ambulatory Visit (INDEPENDENT_AMBULATORY_CARE_PROVIDER_SITE_OTHER): Payer: No Typology Code available for payment source

## 2021-04-02 ENCOUNTER — Ambulatory Visit (HOSPITAL_BASED_OUTPATIENT_CLINIC_OR_DEPARTMENT_OTHER): Payer: No Typology Code available for payment source

## 2021-04-02 ENCOUNTER — Other Ambulatory Visit: Payer: Self-pay

## 2021-04-02 DIAGNOSIS — M25522 Pain in left elbow: Secondary | ICD-10-CM | POA: Diagnosis not present

## 2021-04-04 ENCOUNTER — Telehealth: Payer: Self-pay | Admitting: Pulmonary Disease

## 2021-04-04 ENCOUNTER — Telehealth: Payer: Self-pay | Admitting: Family Medicine

## 2021-04-04 DIAGNOSIS — G4733 Obstructive sleep apnea (adult) (pediatric): Secondary | ICD-10-CM

## 2021-04-04 NOTE — Telephone Encounter (Signed)
Call patient  Sleep study result  Date of study: 03/22/2021  Impression: Severe obstructive sleep apnea Moderate oxygen desaturations  Recommendation: DME referral  Recommend CPAP therapy for severe obstructive sleep apnea  Auto titrating CPAP with pressure settings of 5-20 will be appropriate  Encourage weight loss measures  Follow-up in the office 4 to 6 weeks following initiation of treatment

## 2021-04-04 NOTE — Telephone Encounter (Signed)
MRI of the elbow shows mild tendinosis of the triceps tendon and mild arthritis in the joint.  No indication for surgery at this point.  I recommend taking glucosamine sulfate 1000 mg twice daily, and also turmeric 500 mg twice daily to help with arthritis symptoms.  Physical therapy would be a consideration if symptoms do not improve.

## 2021-04-11 NOTE — Telephone Encounter (Signed)
ATC, could not leave VM, only busy tone.

## 2021-04-14 ENCOUNTER — Other Ambulatory Visit: Payer: Self-pay

## 2021-04-14 ENCOUNTER — Encounter: Payer: Self-pay | Admitting: Podiatry

## 2021-04-14 ENCOUNTER — Ambulatory Visit (INDEPENDENT_AMBULATORY_CARE_PROVIDER_SITE_OTHER): Payer: No Typology Code available for payment source | Admitting: Podiatry

## 2021-04-14 DIAGNOSIS — M722 Plantar fascial fibromatosis: Secondary | ICD-10-CM | POA: Diagnosis not present

## 2021-04-14 NOTE — Progress Notes (Signed)
Subjective:   Patient ID: [name] Foster, male   DOB: 55 y.o.   MRN: [id number]   HPI Patient presents with chronic pain in the left plantar heel that has not responded to previous injection immobilization anti-inflammatories orthotics with a lot of tenderness noted   ROS      Objective:  Physical Exam  Acute Planter fasciitis left that is failed to respond conservatively with posterior Achilles tendon is doing excellent with no pain good motion that was corrected 2 years ago     Assessment:  Acute Planter fasciitis left and well corrected posterior heel with spur removal right     Plan:  H&P reviewed left condition at great length.  Patient has tried conservatively is tried different things its not working he is interested in more permanent solution I recommended endoscopic surgery with release of the medial fascial tendon.  I allowed him to go over consent form explaining all possible complications associated with this and recovery and patient wants surgery and after extensive review signed consent form understanding risk and understands it can take upwards of 6 months for the foot to recover completely.  Also discussed arch pain bilateral foot pain which can occur as a result of the release of the fascial band

## 2021-04-14 NOTE — Telephone Encounter (Signed)
ATC, left VM. Will send letter due to second contact and protocol

## 2021-04-21 ENCOUNTER — Telehealth: Payer: Self-pay | Admitting: Urology

## 2021-04-21 NOTE — Telephone Encounter (Signed)
DOS  - 05/03/21   EPF LEFT --- [id number]   [employer] EFFECTIVE DATE - 09/12/15   RECEIVED FAX FROM [facility] OF BEHALF OF [place] THAT NO PRIOR AUTH IS REQUIRED FOR CPT CODE 09811, [place] # [id number], [place] FROM 05/03/21 - 08/01/21

## 2021-05-03 ENCOUNTER — Ambulatory Visit: Payer: No Typology Code available for payment source | Attending: Family Medicine | Admitting: Physical Therapy

## 2021-05-03 ENCOUNTER — Telehealth: Payer: Self-pay

## 2021-05-03 NOTE — Telephone Encounter (Signed)
[name] Foster called to cancel his surgery with Dr. Paulla Dolly on 05/03/2021. He stated he didn't have someone to take him. He stated he needs to talk to his wife and figure out when it's good with her schedule and he will call me back.

## 2021-05-09 NOTE — Telephone Encounter (Signed)
[name] Foster called to reschedule his surgery with Dr. Paulla Dolly. New date is 05/24/2021.

## 2021-05-11 ENCOUNTER — Encounter: Payer: No Typology Code available for payment source | Admitting: Podiatry

## 2021-05-23 ENCOUNTER — Ambulatory Visit: Payer: Medicare Other | Attending: Family Medicine | Admitting: Physical Therapy

## 2021-05-23 MED ORDER — HYDROMORPHONE HCL 4 MG PO TABS: 4.0000 mg | ORAL_TABLET | ORAL | 0 refills | Status: DC | PRN

## 2021-05-23 NOTE — Addendum Note (Signed)
Addended by: Wallene Huh on: 05/23/2021 05:09 PM   Modules accepted: Orders

## 2021-05-24 ENCOUNTER — Encounter: Payer: Self-pay | Admitting: Podiatry

## 2021-05-24 DIAGNOSIS — M722 Plantar fascial fibromatosis: Secondary | ICD-10-CM | POA: Diagnosis not present

## 2021-05-25 ENCOUNTER — Other Ambulatory Visit: Payer: Self-pay | Admitting: Podiatry

## 2021-05-25 MED ORDER — ONDANSETRON HCL 4 MG PO TABS: 4.0000 mg | ORAL_TABLET | Freq: Three times a day (TID) | ORAL | 0 refills | Status: DC | PRN

## 2021-05-25 MED ORDER — IBUPROFEN 800 MG PO TABS
800.0000 mg | ORAL_TABLET | Freq: Three times a day (TID) | ORAL | 1 refills | Status: DC
Start: 2021-05-25 — End: 2021-09-16

## 2021-05-25 MED ORDER — OXYCODONE-ACETAMINOPHEN 5-325 MG PO TABS
1.0000 | ORAL_TABLET | ORAL | 0 refills | Status: DC | PRN
Start: 2021-05-25 — End: 2021-07-19

## 2021-05-25 NOTE — Progress Notes (Signed)
PRN postop. DC dilaudid.

## 2021-05-30 ENCOUNTER — Ambulatory Visit (INDEPENDENT_AMBULATORY_CARE_PROVIDER_SITE_OTHER): Payer: No Typology Code available for payment source | Admitting: Podiatry

## 2021-05-30 ENCOUNTER — Encounter: Payer: Self-pay | Admitting: Podiatry

## 2021-05-30 ENCOUNTER — Other Ambulatory Visit: Payer: Self-pay

## 2021-05-30 VITALS — BP 147/96 | HR 76 | Temp 97.5°F

## 2021-05-30 DIAGNOSIS — M722 Plantar fascial fibromatosis: Secondary | ICD-10-CM | POA: Diagnosis not present

## 2021-05-30 NOTE — Progress Notes (Signed)
Subjective:   Patient ID: [name] Foster, male   DOB: 55 y.o.   MRN: [id number]   HPI Patient presents stating he is doing well with minimal discomfort and walking with his boot   ROS      Objective:  Physical Exam  Neurovascular status intact negative [name]' sign noted wound edges well coapted left medial lateral heel stitches intact minimal plantar pain     Assessment:  Doing well post endoscopic surgery left plantar fascial     Plan:  Reviewed continued boot usage and continued elevation ice compression reapplied sterile dressing and 2 weeks stitches can be removed and will probably not need to be seen again unless any issues were to occur

## 2021-06-16 ENCOUNTER — Other Ambulatory Visit: Payer: Self-pay

## 2021-06-16 ENCOUNTER — Encounter: Payer: Self-pay | Admitting: Podiatry

## 2021-06-16 ENCOUNTER — Ambulatory Visit (INDEPENDENT_AMBULATORY_CARE_PROVIDER_SITE_OTHER): Payer: No Typology Code available for payment source | Admitting: Podiatry

## 2021-06-16 DIAGNOSIS — M722 Plantar fascial fibromatosis: Secondary | ICD-10-CM | POA: Diagnosis not present

## 2021-06-19 NOTE — Progress Notes (Signed)
Subjective:   Patient ID: [name] Foster, male   DOB: 55 y.o.   MRN: [id number]   HPI Patient states doing well with surgery very pleased with his results at this point   ROS      Objective:  Physical Exam  Neurovascular status intact negative [name]' sign noted stitches intact medial lateral side of the heel left     Assessment:  Doing well post endoscopic surgery left incisions intact     Plan:  Stitches removed wound edges well coapted dispensed ankle compression stocking advised on gradual increase in activities and patient is discharged but will be seen back as needed

## 2021-06-20 ENCOUNTER — Encounter: Payer: Self-pay | Admitting: Physical Therapy

## 2021-06-20 ENCOUNTER — Other Ambulatory Visit: Payer: Self-pay

## 2021-06-20 ENCOUNTER — Ambulatory Visit: Payer: No Typology Code available for payment source | Attending: Orthopaedic Surgery | Admitting: Physical Therapy

## 2021-06-20 DIAGNOSIS — M545 Low back pain, unspecified: Secondary | ICD-10-CM | POA: Diagnosis present

## 2021-06-20 DIAGNOSIS — M25612 Stiffness of left shoulder, not elsewhere classified: Secondary | ICD-10-CM | POA: Insufficient documentation

## 2021-06-20 DIAGNOSIS — M25611 Stiffness of right shoulder, not elsewhere classified: Secondary | ICD-10-CM | POA: Diagnosis present

## 2021-06-20 DIAGNOSIS — R293 Abnormal posture: Secondary | ICD-10-CM | POA: Insufficient documentation

## 2021-06-20 DIAGNOSIS — M62830 Muscle spasm of back: Secondary | ICD-10-CM | POA: Insufficient documentation

## 2021-06-20 DIAGNOSIS — G8929 Other chronic pain: Secondary | ICD-10-CM | POA: Diagnosis present

## 2021-06-20 DIAGNOSIS — M6281 Muscle weakness (generalized): Secondary | ICD-10-CM | POA: Diagnosis present

## 2021-06-20 DIAGNOSIS — R29898 Other symptoms and signs involving the musculoskeletal system: Secondary | ICD-10-CM | POA: Insufficient documentation

## 2021-06-20 DIAGNOSIS — M25512 Pain in left shoulder: Secondary | ICD-10-CM | POA: Insufficient documentation

## 2021-06-20 DIAGNOSIS — M25511 Pain in right shoulder: Secondary | ICD-10-CM | POA: Insufficient documentation

## 2021-06-20 NOTE — Therapy (Signed)
[facility] [place], [place], [address] Phone: [phone]   Fax:  [phone]  Physical Therapy Evaluation  Patient Details  Name: [name] Foster MRN: [id number] Date of Birth: 10-08-1965 Referring Provider (PT): Eunice Blase, MD   Encounter Date: 06/20/2021   PT End of Session - 06/20/21 1103     Visit Number 1    Number of Visits 16    Date for PT Re-Evaluation 08/15/21    Authorization Type UHC Bind - VL: 60 & Medicare (secondary)    PT Start Time 1103    PT Stop Time 1156    PT Time Calculation (min) 53 min    Activity Tolerance Patient tolerated treatment well    Behavior During Therapy [facility] for tasks assessed/performed             Past Medical History:  Diagnosis Date   Back pain    Bone spur    left foot   Chest pain    CHF (congestive heart failure) ([place])    Constipation    COVID-19    Diabetes mellitus type 2 in obese ([place]) 07/13/2013   Diverticulosis    Dyspnea    Erectile dysfunction 01/15/2013   Esophageal reflux 01/15/2013   Fatty liver 07/04/2015   GERD (gastroesophageal reflux disease)    Hiatal hernia    Hiatal hernia with gastroesophageal reflux 03/20/2014   Hyperglycemia 07/13/2013   Hyperplastic colon polyp    Hypertension    Internal hemorrhoids    Lower extremity edema    Mixed hyperlipidemia    Murmur    OSA (obstructive sleep apnea)    s/p UPPP   Plantar fasciitis    Pneumonia due to COVID-19 virus    Prediabetes    Reflux esophagitis    Shoulder pain    Stomach ulcer    from PCP   Urinary hesitancy 09/30/2015   Vitamin D deficiency     Past Surgical History:  Procedure Laterality Date   26 HOUR [place] STUDY N/A 02/28/2016   Procedure: 24 HOUR [place] STUDY;  Surgeon: Jerene Bears, MD;  Location: [facility];  Service: Gastroenterology;  Laterality: N/A;   ESOPHAGEAL MANOMETRY N/A 09/01/2013   Procedure: ESOPHAGEAL MANOMETRY (EM);  Surgeon: Sable Feil,  MD;  Location: [facility];  Service: Endoscopy;  Laterality: N/A;   ESOPHAGEAL MANOMETRY N/A 02/28/2016   Procedure: ESOPHAGEAL MANOMETRY (EM);  Surgeon: Jerene Bears, MD;  Location: [facility];  Service: Gastroenterology;  Laterality: N/A;   FOOT TENDON TRANSFER Right    INGUINAL HERNIA REPAIR     TONSILLECTOMY     UMBILICAL HERNIA REPAIR     UVULECTOMY      There were no vitals filed for this visit.    Subjective Assessment - 06/20/21 1106     Subjective Pt reports he is out of work on a worker's comp claim for 3 yrs due to B shoulder, back & B foot pain - his job has since terminated him. LTD insurance had him apply for disability which has been approved.  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 Known RTC tear in R shoulder. Recent L plantar fascia release, H/o R foot spur removal and Achilles lengthening.    Limitations Sitting;Standing;Walking;House hold activities;Lifting    How long can you sit comfortably? 30 minutes    How long can you stand comfortably? 30 minutes  How long can you walk comfortably? 30-45 minutes    Patient Stated Goals "to be able to walk and bend w/o pain and use shoulder w/o pain"    Currently in Pain? Yes    Pain Score 7     Pain Location Shoulder    Pain Orientation Left;Right   L>R   Pain Descriptors / Indicators Sharp;Throbbing    Pain Type Chronic pain    Pain Onset Other (comment)   3 yrs   Pain Frequency Constant    Aggravating Factors  lifting or using shoulders, mowing the grass, laying on side while sleeping    Pain Relieving Factors ibuprofen, Tylenol    Effect of Pain on Daily Activities difficulty with household chores, out ot work d/t pain    Pain Score 6    Pain Location Back    Pain Orientation Lower;Left;Right   L>R   Pain Descriptors / Indicators Throbbing;Tightness    Pain Type Chronic pain    Pain Radiating Towards into L buttocks, denies N/T or B/B changes    Pain  Onset Other (comment)   ~3 yrs   Pain Frequency Intermittent    Aggravating Factors  lifting, moving things    Pain Relieving Factors stretching into extension, Tylenol    Effect of Pain on Daily Activities limited with chores around house, difficulty driving for long periods, difficulty climbing stairs                [facility] PT Assessment - 06/20/21 1103       Assessment   Medical Diagnosis Chronic B shoulder pain, Chronic LBP w/o scaitica    Referring Provider (PT) Eunice Blase, MD    Onset Date/Surgical Date --   ~3 yrs   Hand Dominance Right    Next MD Visit none scheduled    Prior Therapy ~16 visits for R foot/ankle, h/o PT for LBP      Precautions   Required Braces or Orthoses Other Brace/Splint    Other Brace/Splint L walking boot - in & out x next 4 weeks   pt not wearing boot on eval     Restrictions   Weight Bearing Restrictions No      Balance Screen   Has the patient fallen in the past 6 months Yes    How many times? 1 - recent slip while reaching for puppy    Has the patient had a decrease in activity level because of a fear of falling?  No    Is the patient reluctant to leave their home because of a fear of falling?  No      Home Environment   Living Environment Private residence    Type of [place] Access Level entry    Home Layout One level      Prior Function   Level of Independence Independent    Vocation On disability    Leisure bowling, used to [employer]   Overall Cognitive Status Within Functional Limits for tasks assessed      ROM / Strength   AROM / PROM / Strength AROM;Strength      AROM   AROM Assessment Site Shoulder;Lumbar    Right/Left Shoulder Right;Left    Right Shoulder Flexion 125 Degrees    Right Shoulder ABduction 110 Degrees    Right Shoulder Internal Rotation --   FIR to L1   Right Shoulder External Rotation --   FER to C7  Left Shoulder Flexion 129 Degrees    Left Shoulder ABduction 106 Degrees     Left Shoulder Internal Rotation --   FIR to T12   Left Shoulder External Rotation --   FER to C7   Lumbar Flexion hands to ankles    Lumbar Extension 25% limited    Lumbar - Right Side Bend hand to lateral joint line of knee    Lumbar - Left Side Bend hand to lateral joint line of knee    Lumbar - Right Rotation WFL    Lumbar - Left Rotation [facility]      Strength   Strength Assessment Site Shoulder;Hip;Knee;Ankle    Right/Left Shoulder Right;Left    Right Shoulder Flexion 4/5    Right Shoulder ABduction 4+/5    Right Shoulder Internal Rotation 4+/5    Right Shoulder External Rotation 4/5    Left Shoulder Flexion 4-/5    Left Shoulder ABduction 4-/5    Left Shoulder Internal Rotation 4/5    Left Shoulder External Rotation 4-/5    Right/Left Hip Right;Left    Right Hip Flexion 4/5    Right Hip Extension 4/5    Right Hip External Rotation  4/5    Right Hip Internal Rotation 4+/5    Right Hip ABduction 4/5    Right Hip ADduction 4/5    Left Hip Flexion 3+/5    Left Hip Extension 4-/5    Left Hip External Rotation 4-/5    Left Hip Internal Rotation 4/5    Left Hip ABduction 4-/5    Left Hip ADduction 4-/5    Right/Left Knee Right;Left    Right Knee Flexion 5/5    Right Knee Extension 5/5    Left Knee Flexion 5/5    Left Knee Extension 5/5    Right/Left Ankle Right;Left    Right Ankle Dorsiflexion 5/5    Left Ankle Dorsiflexion 5/5      Flexibility   Soft Tissue Assessment /Muscle Length yes    Hamstrings mild tight B    Quadriceps mod tight B    ITB mild/mod tight B    Piriformis mod tight B    Obturator Internus mod tight B      Palpation   Palpation comment increased muscle tension in B lumbar paraspinals, glutes & piriformis - more TTP on L>R                        Objective measurements completed on examination: See above findings.                PT Education - 06/20/21 1155     Education Details PT eval findings, anticipated POC &  initial HEP - Access Code: [email or web address]    Person(s) Educated Patient    Methods Explanation;Demonstration;Verbal cues;Tactile cues;Handout    Comprehension Verbalized understanding;Verbal cues required;Tactile cues required;Returned demonstration;Need further instruction              PT Short Term Goals - 06/20/21 1156       PT SHORT TERM GOAL #1   Title Patient will be independent with initial HEP    Status New    Target Date 07/11/21      PT SHORT TERM GOAL #2   Title Improve posture and alignment with patient to demonstrate improved upright posture with posterior shoulder girdle engaged    Status New    Target Date 07/18/21      PT SHORT TERM GOAL #3  Title Patient will verbalize/demonstrate understanding of neutral spine posture and proper body mechanics to reduce strain on lumbar spine    Status New    Target Date 07/18/21               PT Long Term Goals - 06/20/21 1156       PT LONG TERM GOAL #1   Title Patient will be independent with ongoing/advanced HEP for self-management at home    Status New    Target Date 08/15/21      PT LONG TERM GOAL #2   Title Patient to improve B shoulder AROM to [facility] without pain provocation    Status New    Target Date 08/15/21      PT LONG TERM GOAL #3   Title Patient to improve lumbar AROM to [facility] without pain provocation    Status New    Target Date 08/15/21      PT LONG TERM GOAL #4   Title Patient will demonstrate improved B shoulder strength to >/= 4+/5 for functional UE use    Status New    Target Date 08/15/21      PT LONG TERM GOAL #5   Title Patient will demonstrate improved B hip strength to >/= 4+/5 for improved stability and ease of mobility    Status New    Target Date 08/15/21      PT LONG TERM GOAL #6   Title Patient to report reduction in frequency and intensity of B shoulder pain and LBP by >/= 50%    Status New    Target Date 08/15/21      PT LONG TERM GOAL #7   Title Patient to report ability  to perform ADLs, household, leisure activities and walking without limitation due to B shoulder & low back pain, LOM or weakness    Status New    Target Date 08/15/21                    Plan - 06/20/21 1156     Clinical Impression Statement [name] Foster is a 55 y/o male who presents to OP PT for chronic B shoulder pain and chronic LBP w/o sciatica. 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 He also notes h/o R RTC injury for which he may eventually have surgery. He has recent L foot surgery and intermittently wears a walking boot which has contributed to worsening of his LBP due to uneven leg length (educated pt on use of an "Even-Up" shoe lift for non-surgical LE to better level him out with the walking boot). Deficits include B shoulder (L>R) and low back pain (L>R) with extension into L buttock, abnormal posture, limited and painful B shoulder AROM and strength, limited and painful lumbar AROM, limited proximal LE flexibility, and L>R proximal LE weakness. [place] will benefit from skilled PT to address above deficits, improve posture and increase pain-free shoulder and lumbar ROM and B UE/LE strength to allow him to resume normal daily activities with decreased pain interference.    Personal Factors and Comorbidities Time since onset of injury/illness/exacerbation;Past/Current Experience;Comorbidity 3+;Profession;Fitness    Comorbidities HTN, OSA, obesity, B foot pain s/p R bone spur removal and tendon lengthening vs transfer and recent L plantar fascia resection, anxiety, depression, obesity    Examination-Activity Limitations Bend;Lift;Carry;Locomotion Level;Sit;Stand;Squat;Stairs    Examination-Participation Restrictions Cleaning;Community Activity;Driving;Laundry;Meal  Prep;Occupation;Shop;Yard Work    [employer] Evolving/Moderate complexity     Clinical Decision Making Moderate    Rehab Potential Good    PT Frequency 2x / week    PT Duration 8 weeks    PT Treatment/Interventions ADLs/Self Care Home Management;Cryotherapy;Electrical Stimulation;Iontophoresis 4mg /ml Dexamethasone;Moist Heat;Traction;Ultrasound;Gait training;Stair training;Functional mobility training;Therapeutic activities;Therapeutic exercise;Balance training;Neuromuscular re-education;Patient/family education;Manual techniques;Passive range of motion;Dry needling;Taping;Vasopneumatic Device;Spinal Manipulations;Joint Manipulations    PT Next Visit Plan Review initial HEP; B shoulder, postural & lumbopelvic flexibility & strengthening; Manual therapy and modalities PRN    PT Home Exercise Plan Access Code: [email or web address] (10/10)    Consulted and Agree with Plan of Care Patient             Patient will benefit from skilled therapeutic intervention in order to improve the following deficits and impairments:  Decreased activity tolerance, Decreased knowledge of precautions, Decreased mobility, Decreased range of motion, Decreased safety awareness, Decreased strength, Difficulty walking, Hypomobility, Increased fascial restricitons, Increased muscle spasms, Impaired perceived functional ability, Impaired flexibility, Improper body mechanics, Postural dysfunction, Impaired UE functional use, Pain  Visit Diagnosis: Chronic left shoulder pain  Chronic right shoulder pain  Chronic bilateral low back pain without sciatica  Stiffness of left shoulder, not elsewhere classified  Stiffness of right shoulder, not elsewhere classified  Abnormal posture  Muscle weakness (generalized)  Muscle spasm of back  Other symptoms and signs involving the musculoskeletal system     Problem List Patient Active Problem List   Diagnosis Date Noted   Chronic bilateral low back pain without sciatica 08/12/2020   Superior labrum anterior-to-posterior (SLAP) tear of right shoulder  04/27/2020   Chronic right shoulder pain 03/31/2020   Chronic left shoulder pain 03/31/2020   Urinary frequency 12/10/2019   Dysuria 12/08/2019   Pneumonia due to COVID-19 virus 07/23/2019   Bilateral inguinal hernia without obstruction or gangrene 25/85/2778   Umbilical hernia without obstruction and without gangrene 04/11/2017   Vitamin D deficiency 01/24/2017   Depression 01/24/2017   Shortness of breath on exertion 11/27/2016   Internal hemorrhoids    Atypical chest pain    Gastroesophageal reflux disease without esophagitis    Preventative health care 10/10/2015   Sleep apnea 10/10/2015   Urinary hesitancy 09/30/2015   Fatty liver 07/04/2015   Edema 05/07/2014   Scleroderma of esophagus ([place]) 03/20/2014   Hiatal hernia with gastroesophageal reflux 03/20/2014   Diabetes mellitus type 2 in obese ([place]) 07/13/2013   Anxiety state 07/13/2013   Erectile dysfunction 01/15/2013   Back pain 03/09/2012   Mesenteric panniculitis ([place]) 02/06/2012   Bilateral foot pain 04/25/2010   Obesity 01/05/2010   Hyperlipidemia 10/04/2009   Essential hypertension 10/04/2009    Percival Spanish, PT 06/20/2021, 6:45 PM  [facility] [address]  [place], [place], [address] Phone: [phone]   Fax:  [phone]  Name: [name] Foster MRN: [id number] Date of Birth: 01-13-1966

## 2021-06-20 NOTE — Patient Instructions (Addendum)
        Access Code: [email or web address] URL: [email or web address] Date: 06/20/2021 Prepared by: Annie Paras  Exercises Hooklying Single Knee to Chest Stretch with Towel - 2-3 x daily - 7 x weekly - 3 reps - 30 sec hold Supine Figure 4 Piriformis Stretch - 2-3 x daily - 7 x weekly - 3 reps - 30 sec hold Supine Piriformis Stretch with Foot on Ground - 2-3 x daily - 7 x weekly - 3 reps - 30 sec hold Supine Hip Adductor Stretch - 2-3 x daily - 7 x weekly - 3 reps - 30 sec hold Supine Lower Trunk Rotation - 2-3 x daily - 7 x weekly - 5 reps - 10 sec hold Doorway Pec Stretch at 60 Degrees Abduction with Arm Straight - 2-3 x daily - 7 x weekly - 3 reps - 30 sec hold Seated Scapular Retraction - 3 x daily - 7 x weekly - 2 sets - 10 reps - 5 sec hold

## 2021-06-22 ENCOUNTER — Other Ambulatory Visit: Payer: Self-pay | Admitting: Podiatry

## 2021-06-23 ENCOUNTER — Ambulatory Visit: Payer: No Typology Code available for payment source | Admitting: Physical Therapy

## 2021-06-23 ENCOUNTER — Other Ambulatory Visit: Payer: Self-pay

## 2021-06-23 ENCOUNTER — Encounter: Payer: Self-pay | Admitting: Physical Therapy

## 2021-06-23 DIAGNOSIS — M25611 Stiffness of right shoulder, not elsewhere classified: Secondary | ICD-10-CM

## 2021-06-23 DIAGNOSIS — M25512 Pain in left shoulder: Secondary | ICD-10-CM | POA: Diagnosis not present

## 2021-06-23 DIAGNOSIS — R293 Abnormal posture: Secondary | ICD-10-CM

## 2021-06-23 DIAGNOSIS — G8929 Other chronic pain: Secondary | ICD-10-CM

## 2021-06-23 DIAGNOSIS — M25612 Stiffness of left shoulder, not elsewhere classified: Secondary | ICD-10-CM

## 2021-06-23 DIAGNOSIS — M6281 Muscle weakness (generalized): Secondary | ICD-10-CM

## 2021-06-23 DIAGNOSIS — R29898 Other symptoms and signs involving the musculoskeletal system: Secondary | ICD-10-CM

## 2021-06-23 DIAGNOSIS — M62830 Muscle spasm of back: Secondary | ICD-10-CM

## 2021-06-23 NOTE — Patient Instructions (Signed)
    Access Code: [email or web address] URL: [email or web address] Date: 06/23/2021 Prepared by: Annie Paras  Exercises Hooklying Single Knee to Chest Stretch with Towel - 2-3 x daily - 7 x weekly - 3 reps - 30 sec hold Supine Figure 4 Piriformis Stretch - 2-3 x daily - 7 x weekly - 3 reps - 30 sec hold Supine Piriformis Stretch with Foot on Ground - 2-3 x daily - 7 x weekly - 3 reps - 30 sec hold Supine Hip Adductor Stretch - 2-3 x daily - 7 x weekly - 3 reps - 30 sec hold Supine Lower Trunk Rotation - 2-3 x daily - 7 x weekly - 5 reps - 10 sec hold Doorway Pec Stretch at 60 Degrees Abduction with Arm Straight - 2-3 x daily - 7 x weekly - 3 reps - 30 sec hold Seated Scapular Retraction - 3 x daily - 7 x weekly - 2 sets - 10 reps - 5 sec hold Supine Hip Adduction Isometric with Ball - 2 x daily - 7 x weekly - 2 sets - 10 reps - 5 sec hold Hooklying Clamshell with Resistance - 1 x daily - 7 x weekly - 2 sets - 10 reps - 3-5 sec hold Supine Shoulder Horizontal Abduction with Resistance - 1 x daily - 7 x weekly - 2 sets - 10 reps - 3 sec hold

## 2021-06-23 NOTE — Therapy (Addendum)
[facility] [address]  [place], [place], [address] Phone: [phone]   Fax:  [phone]  Physical Therapy Treatment / Discharge Summary  Patient Details  Name: [name] Foster MRN: [id number] Date of Birth: February 28, 1966 Referring Provider (PT): Eunice Blase, MD   Encounter Date: 06/23/2021   PT End of Session - 06/23/21 0934     Visit Number 2    Number of Visits 16    Date for PT Re-Evaluation 08/15/21    Authorization Type U[employer] Bind - VL: 51 & Medicare (secondary)    PT Start Time 0934    PT Stop Time 1017    PT Time Calculation (min) 43 min    Activity Tolerance Patient tolerated treatment well    Behavior During Therapy [facility] for tasks assessed/performed             Past Medical History:  Diagnosis Date   Back pain    Bone spur    left foot   Chest pain    CHF (congestive heart failure) ([place])    Constipation    COVID-19    Diabetes mellitus type 2 in obese ([place]) 07/13/2013   Diverticulosis    Dyspnea    Erectile dysfunction 01/15/2013   Esophageal reflux 01/15/2013   Fatty liver 07/04/2015   GERD (gastroesophageal reflux disease)    Hiatal hernia    Hiatal hernia with gastroesophageal reflux 03/20/2014   Hyperglycemia 07/13/2013   Hyperplastic colon polyp    Hypertension    Internal hemorrhoids    Lower extremity edema    Mixed hyperlipidemia    Murmur    OSA (obstructive sleep apnea)    s/p UPPP   Plantar fasciitis    Pneumonia due to COVID-19 virus    Prediabetes    Reflux esophagitis    Shoulder pain    Stomach ulcer    from PCP   Urinary hesitancy 09/30/2015   Vitamin D deficiency     Past Surgical History:  Procedure Laterality Date   67 HOUR [place] STUDY N/A 02/28/2016   Procedure: 24 HOUR [place] STUDY;  Surgeon: Jerene Bears, MD;  Location: [facility];  Service: Gastroenterology;  Laterality: N/A;   ESOPHAGEAL MANOMETRY N/A 09/01/2013   Procedure: ESOPHAGEAL MANOMETRY (EM);  Surgeon:  Sable Feil, MD;  Location: [facility];  Service: Endoscopy;  Laterality: N/A;   ESOPHAGEAL MANOMETRY N/A 02/28/2016   Procedure: ESOPHAGEAL MANOMETRY (EM);  Surgeon: Jerene Bears, MD;  Location: [facility];  Service: Gastroenterology;  Laterality: N/A;   FOOT TENDON TRANSFER Right    INGUINAL HERNIA REPAIR     TONSILLECTOMY     UMBILICAL HERNIA REPAIR     UVULECTOMY      There were no vitals filed for this visit.   Subjective Assessment - 06/23/21 0936     Subjective Pt reports he tried the initial HEP once since the eval - denies any problems.    Patient Stated Goals "to be able to walk and bend w/o pain and use shoulder w/o pain"    Currently in Pain? Yes    Pain Score 7     Pain Location Shoulder    Pain Orientation Left;Right    Pain Descriptors / Indicators Throbbing    Pain Type Chronic pain    Pain Frequency Constant    Pain Score 7    Pain Location Back    Pain Orientation Lower    Pain Descriptors / Indicators  Tightness;Throbbing    Pain Type Chronic pain    Pain Frequency Intermittent                               [facility] Adult PT Treatment/Exercise - 06/23/21 0934       Exercises   Exercises Lumbar      Lumbar Exercises: Aerobic   Nustep L3 x 6 min (UE/LE)      Lumbar Exercises: Supine   Pelvic Tilt 10 reps;5 seconds    Pelvic Tilt Limitations PPT    Clam 10 reps;3 seconds    Clam Limitations red TB at knees    Bent Knee Raise 10 reps    Bent Knee Raise Limitations red TB brace march   pt noting discomfort in R groin   Bridge 10 reps;5 seconds    Bridge Limitations + red TB hip ABD isometric    Other Supine Lumbar Exercises TrA + hip ADD ball squeeze 10 x 5"      Shoulder Exercises: Supine   Horizontal ABduction Both;10 reps;Strengthening;Theraband    Theraband Level (Shoulder Horizontal ABduction) Level 2 (Red)    Horizontal ABduction Limitations cues for TrA activation & scap retraction    External Rotation Both;10  reps;Strengthening;Theraband    Theraband Level (Shoulder External Rotation) Level 2 (Red)    External Rotation Limitations cues for TrA activation & scap retraction    Diagonals Both;10 reps;Strengthening;Theraband    Theraband Level (Shoulder Diagonals) Level 2 (Red)    Diagonals Limitations cues for TrA activation & scap retraction                     PT Education - 06/23/21 1015     Education Details HEP update - lumbopelvic & scapular strengthening - Access Code: [email or web address]    Person(s) Educated Patient    Methods Explanation;Demonstration;Verbal cues;Tactile cues;Handout    Comprehension Verbalized understanding;Verbal cues required;Tactile cues required;Returned demonstration;Need further instruction              PT Short Term Goals - 06/23/21 0939       PT SHORT TERM GOAL #1   Title Patient will be independent with initial HEP    Status On-going    Target Date 07/11/21      PT SHORT TERM GOAL #2   Title Improve posture and alignment with patient to demonstrate improved upright posture with posterior shoulder girdle engaged    Status On-going    Target Date 07/18/21      PT SHORT TERM GOAL #3   Title Patient will verbalize/demonstrate understanding of neutral spine posture and proper body mechanics to reduce strain on lumbar spine    Status On-going    Target Date 07/18/21               PT Long Term Goals - 06/23/21 0939       PT LONG TERM GOAL #1   Title Patient will be independent with ongoing/advanced HEP for self-management at home    Status On-going    Target Date 08/15/21      PT LONG TERM GOAL #2   Title Patient to improve B shoulder AROM to [facility] without pain provocation    Status On-going    Target Date 08/15/21      PT LONG TERM GOAL #3   Title Patient to improve lumbar AROM to [facility] without pain provocation    Status On-going    Target  Date 08/15/21      PT LONG TERM GOAL #4   Title Patient will demonstrate improved B shoulder  strength to >/= 4+/5 for functional UE use    Status On-going    Target Date 08/15/21      PT LONG TERM GOAL #5   Title Patient will demonstrate improved B hip strength to >/= 4+/5 for improved stability and ease of mobility    Status On-going    Target Date 08/15/21      PT LONG TERM GOAL #6   Title Patient to report reduction in frequency and intensity of B shoulder pain and LBP by >/= 50%    Status On-going    Target Date 08/15/21      PT LONG TERM GOAL #7   Title Patient to report ability to perform ADLs, household, leisure activities and walking without limitation due to B shoulder & low back pain, LOM or weakness    Status On-going    Target Date 08/15/21                   Plan - 06/23/21 0940     Clinical Impression Statement Buel reports initial attempt at HEP seemed to go okay but did request some verbal clarification today, although he denied need for practice of any exercise. Introduced core/lumbopelvic and postural strengthening in supine/hooklying today with cues necessary to achieve good abdominal muscle activation but otherwise mostly well tolerated with only exception being increased R anterior hip/groin pain with brace marching. HEP updated to include some lumbopelvic and postural strengthening.    Comorbidities HTN, OSA, obesity, B foot pain s/p R bone spur removal and tendon lengthening vs transfer and recent L plantar fascia resection, anxiety, depression, obesity    Rehab Potential Good    PT Frequency 2x / week    PT Duration 8 weeks    PT Treatment/Interventions ADLs/Self Care Home Management;Cryotherapy;Electrical Stimulation;Iontophoresis 11m/ml Dexamethasone;Moist Heat;Traction;Ultrasound;Gait training;Stair training;Functional mobility training;Therapeutic activities;Therapeutic exercise;Balance training;Neuromuscular re-education;Patient/family education;Manual techniques;Passive range of motion;Dry needling;Taping;Vasopneumatic Device;Spinal  Manipulations;Joint Manipulations    PT Next Visit Plan Review initial HEP PRN; Posture & body mechanics education; B shoulder, postural & lumbopelvic flexibility & strengthening - update HEP as indicated; Manual therapy and modalities PRN    PT Home Exercise Plan Access Code: [email or web address](10/10, updated 10/13)    Consulted and Agree with Plan of Care Patient             Patient will benefit from skilled therapeutic intervention in order to improve the following deficits and impairments:  Decreased activity tolerance, Decreased knowledge of precautions, Decreased mobility, Decreased range of motion, Decreased safety awareness, Decreased strength, Difficulty walking, Hypomobility, Increased fascial restricitons, Increased muscle spasms, Impaired perceived functional ability, Impaired flexibility, Improper body mechanics, Postural dysfunction, Impaired UE functional use, Pain  Visit Diagnosis: Chronic left shoulder pain  Chronic right shoulder pain  Chronic bilateral low back pain without sciatica  Stiffness of left shoulder, not elsewhere classified  Stiffness of right shoulder, not elsewhere classified  Abnormal posture  Muscle weakness (generalized)  Muscle spasm of back  Other symptoms and signs involving the musculoskeletal system     Problem List Patient Active Problem List   Diagnosis Date Noted   Chronic bilateral low back pain without sciatica 08/12/2020   Superior labrum anterior-to-posterior (SLAP) tear of right shoulder 04/27/2020   Chronic right shoulder pain 03/31/2020   Chronic left shoulder pain 03/31/2020   Urinary frequency 12/10/2019   Dysuria 12/08/2019   Pneumonia  due to COVID-19 virus 07/23/2019   Bilateral inguinal hernia without obstruction or gangrene 11/64/3539   Umbilical hernia without obstruction and without gangrene 04/11/2017   Vitamin D deficiency 01/24/2017   Depression 01/24/2017   Shortness of breath on exertion 11/27/2016   Internal  hemorrhoids    Atypical chest pain    Gastroesophageal reflux disease without esophagitis    Preventative health care 10/10/2015   Sleep apnea 10/10/2015   Urinary hesitancy 09/30/2015   Fatty liver 07/04/2015   Edema 05/07/2014   Scleroderma of esophagus ([place]) 03/20/2014   Hiatal hernia with gastroesophageal reflux 03/20/2014   Diabetes mellitus type 2 in obese ([place]) 07/13/2013   Anxiety state 07/13/2013   Erectile dysfunction 01/15/2013   Back pain 03/09/2012   Mesenteric panniculitis ([place]) 02/06/2012   Bilateral foot pain 04/25/2010   Obesity 01/05/2010   Hyperlipidemia 10/04/2009   Essential hypertension 10/04/2009    Percival Spanish, PT 06/23/2021, 2:34 PM  [facility] [address]  [place], [place], [address] Phone: [phone]   Fax:  [phone]  Name: [name] Foster MRN: [id number] Date of Birth: 09-11-1966   PHYSICAL THERAPY DISCHARGE SUMMARY  Visits from Start of Care: 2  Current functional level related to goals / functional outcomes:   Refer to above clinical impression for status as of last visit on 06/23/2021. Patient cancelled his next visit and no showed for the next 4 visits, therefore will proceed with discharge from PT for this episode per Cx/NS policy.   Remaining deficits:   As above. Pt only returned for 1 treatment visit.   Education / Equipment:   Initial HEP   Patient agrees to discharge. Patient goals were not met. Patient is being discharged due to not returning since the last visit.  Percival Spanish, PT, MPT 08/02/21, 1:25 PM  [facility] [address]  [place], [place], [address] Phone: [phone]   Fax:  [phone]

## 2021-06-30 ENCOUNTER — Other Ambulatory Visit: Payer: Self-pay

## 2021-06-30 ENCOUNTER — Ambulatory Visit: Payer: No Typology Code available for payment source

## 2021-06-30 DIAGNOSIS — I10 Essential (primary) hypertension: Secondary | ICD-10-CM

## 2021-06-30 MED ORDER — TRIAMTERENE-HCTZ 37.5-25 MG PO TABS: 1.0000 | ORAL_TABLET | Freq: Every day | ORAL | 1 refills | Status: DC

## 2021-06-30 MED ORDER — ROSUVASTATIN CALCIUM 40 MG PO TABS: 40.0000 mg | ORAL_TABLET | Freq: Every day | ORAL | 1 refills | Status: DC

## 2021-07-05 ENCOUNTER — Ambulatory Visit: Payer: No Typology Code available for payment source | Admitting: Family Medicine

## 2021-07-06 ENCOUNTER — Ambulatory Visit: Payer: No Typology Code available for payment source

## 2021-07-11 ENCOUNTER — Ambulatory Visit: Payer: No Typology Code available for payment source

## 2021-07-13 ENCOUNTER — Ambulatory Visit: Payer: No Typology Code available for payment source | Attending: Orthopaedic Surgery

## 2021-07-14 DIAGNOSIS — M79676 Pain in unspecified toe(s): Secondary | ICD-10-CM

## 2021-07-19 ENCOUNTER — Other Ambulatory Visit: Payer: Self-pay

## 2021-07-19 ENCOUNTER — Ambulatory Visit: Payer: No Typology Code available for payment source | Admitting: Physical Therapy

## 2021-07-19 ENCOUNTER — Encounter: Payer: Self-pay | Admitting: Family Medicine

## 2021-07-19 ENCOUNTER — Ambulatory Visit (INDEPENDENT_AMBULATORY_CARE_PROVIDER_SITE_OTHER): Payer: No Typology Code available for payment source | Admitting: Family Medicine

## 2021-07-19 VITALS — BP 128/86 | HR 74 | Temp 98.1°F | Resp 16 | Wt 280.0 lb

## 2021-07-19 DIAGNOSIS — K219 Gastro-esophageal reflux disease without esophagitis: Secondary | ICD-10-CM

## 2021-07-19 DIAGNOSIS — R0789 Other chest pain: Secondary | ICD-10-CM | POA: Diagnosis not present

## 2021-07-19 DIAGNOSIS — G4730 Sleep apnea, unspecified: Secondary | ICD-10-CM

## 2021-07-19 DIAGNOSIS — M341 CR(E)ST syndrome: Secondary | ICD-10-CM | POA: Diagnosis not present

## 2021-07-19 DIAGNOSIS — E669 Obesity, unspecified: Secondary | ICD-10-CM

## 2021-07-19 DIAGNOSIS — K449 Diaphragmatic hernia without obstruction or gangrene: Secondary | ICD-10-CM | POA: Diagnosis not present

## 2021-07-19 DIAGNOSIS — E559 Vitamin D deficiency, unspecified: Secondary | ICD-10-CM

## 2021-07-19 DIAGNOSIS — I10 Essential (primary) hypertension: Secondary | ICD-10-CM | POA: Diagnosis not present

## 2021-07-19 DIAGNOSIS — R1310 Dysphagia, unspecified: Secondary | ICD-10-CM

## 2021-07-19 DIAGNOSIS — E1169 Type 2 diabetes mellitus with other specified complication: Secondary | ICD-10-CM | POA: Diagnosis not present

## 2021-07-19 DIAGNOSIS — E785 Hyperlipidemia, unspecified: Secondary | ICD-10-CM

## 2021-07-19 DIAGNOSIS — Z6831 Body mass index (BMI) 31.0-31.9, adult: Secondary | ICD-10-CM

## 2021-07-19 DIAGNOSIS — H547 Unspecified visual loss: Secondary | ICD-10-CM

## 2021-07-19 DIAGNOSIS — R350 Frequency of micturition: Secondary | ICD-10-CM

## 2021-07-19 LAB — LIPID PANEL
Cholesterol: 203 mg/dL — ABNORMAL HIGH (ref 0–200)
HDL: 39.2 mg/dL (ref >39.00)
LDL Cholesterol: 135 mg/dL — ABNORMAL HIGH (ref 0–99)
NonHDL: 163.48
Total CHOL/HDL Ratio: 5
Triglycerides: 144 mg/dL (ref 0.0–149.0)
VLDL: 28.8 mg/dL (ref 0.0–40.0)

## 2021-07-19 LAB — CBC
HCT: 45 % (ref 39.0–52.0)
Hemoglobin: 15 g/dL (ref 13.0–17.0)
MCHC: 33.3 g/dL (ref 30.0–36.0)
MCV: 86.6 fl (ref 78.0–100.0)
Platelets: 183 10*3/uL (ref 150.0–400.0)
RBC: 5.19 Mil/uL (ref 4.22–5.81)
RDW: 14.1 % (ref 11.5–15.5)
WBC: 4 10*3/uL (ref 4.0–10.5)

## 2021-07-19 LAB — HEMOGLOBIN A1C: Hgb A1c MFr Bld: 7.4 % — ABNORMAL HIGH (ref 4.6–6.5)

## 2021-07-19 LAB — COMPREHENSIVE METABOLIC PANEL
ALT: 30 U/L (ref 0–53)
AST: 21 U/L (ref 0–37)
Albumin: 4.4 g/dL (ref 3.5–5.2)
Alkaline Phosphatase: 72 U/L (ref 39–117)
BUN: 11 mg/dL (ref 6–23)
CO2: 25 mEq/L (ref 19–32)
Calcium: 9.1 mg/dL (ref 8.4–10.5)
Chloride: 105 mEq/L (ref 96–112)
Creatinine, Ser: 0.78 mg/dL (ref 0.40–1.50)
GFR: 101.65 mL/min (ref >60.00)
Glucose, Bld: 112 mg/dL — ABNORMAL HIGH (ref 70–99)
Potassium: 3.7 mEq/L (ref 3.5–5.1)
Sodium: 138 mEq/L (ref 135–145)
Total Bilirubin: 0.4 mg/dL (ref 0.2–1.2)
Total Protein: 7.2 g/dL (ref 6.0–8.3)

## 2021-07-19 LAB — VITAMIN D 25 HYDROXY (VIT D DEFICIENCY, FRACTURES): VITD: 23.97 ng/mL — ABNORMAL LOW (ref 30.00–100.00)

## 2021-07-19 LAB — TSH: TSH: 4.03 u[IU]/mL (ref 0.35–5.50)

## 2021-07-19 MED ORDER — TIZANIDINE HCL 2 MG PO TABS: 1.0000 mg | ORAL_TABLET | Freq: Every evening | ORAL | 1 refills | Status: DC | PRN

## 2021-07-19 MED ORDER — ESOMEPRAZOLE MAGNESIUM 40 MG PO CPDR: 40.0000 mg | DELAYED_RELEASE_CAPSULE | Freq: Every day | ORAL | 3 refills | Status: DC

## 2021-07-19 MED ORDER — FAMOTIDINE 40 MG PO TABS: 40.0000 mg | ORAL_TABLET | Freq: Every day | ORAL | 1 refills | Status: DC

## 2021-07-19 NOTE — Assessment & Plan Note (Signed)
Well controlled, no changes to meds. Encouraged heart healthy diet such as the DASH diet and exercise as tolerated.  °

## 2021-07-19 NOTE — Assessment & Plan Note (Signed)
Supplement and monitor 

## 2021-07-19 NOTE — Assessment & Plan Note (Signed)
Is now awaiting CPAP due to supply chain issues

## 2021-07-19 NOTE — Patient Instructions (Addendum)
Need to make appointment with cardiology in April 2023.  Follow up with pulmonary to check on CPAP status  Check first if [name] Foster is covered by insurance for weight loss. If not accepted, ask if there are any similar weight loss medications they cover.  [name] Foster and [name] Foster are the other weight loss medications. [name] Foster is an injectable you take once weekly and [name] Foster is a pill you take daily.  Please contact your insurance to check coverage for the [name] Foster vaccine to protect you against shingles. This is a 2 dose series.  One shot followed by a second shot 2-6 months later.  If this vaccine is covered by your insurance, please contact our office to schedule a nurse visit and we will be happy to give it to you.

## 2021-07-19 NOTE — Assessment & Plan Note (Signed)
Encouraged DASH or MIND diet, decrease po intake and increase exercise as tolerated. Needs 7-8 hours of sleep nightly. Avoid trans fats, eat small, frequent meals every 4-5 hours with lean proteins, complex carbs and healthy fats. Minimize simple carbs, high fat foods and processed foods 

## 2021-07-19 NOTE — Assessment & Plan Note (Signed)
Encourage heart healthy diet such as MIND or DASH diet, increase exercise, avoid trans fats, simple carbohydrates and processed foods, consider a krill or fish or flaxseed oil cap daily.  °

## 2021-07-19 NOTE — Progress Notes (Signed)
Patient ID: [name] Foster, male    DOB: [birth year]  Age: 55 y.o. MRN: [id number]    Subjective:   Chief Complaint  Patient presents with   3 months follow up   Subjective   HPI [name] Foster presents for office visit today for follow up on htn and type 2 diabetes. He has c/o CP, acid reflux, and back pain. He states that it is hard to tell the difference between CP and acid reflux.   However for the CP, it happens after he eats occasionally and later during the day closer to bedtime. The pain can be severe to the point it wakes him up. He reports that it can last from several minutes to hours. He describes quality of pain to be stabbing and radiates towards his back. He reports that the onset was about a week ago and the pain comes and goes, experiences it 2-3 times a week. He has an endoscopy but it was a while ago. He gets heartburn about every other day. Denies palp/SOB/HA/congestion/fevers or GU c/o. Taking meds as prescribed. Sometimes struggles with swallowing   In-home sleep study did confirm that he needs a CPAP, but he was unable to get one due to supply shortage. He states that he went from 50,000 to 2000 IU Vit D.  Glasses not working as well so he plans on seeing his eye doctor.   Review of Systems  Constitutional:  Negative for chills, fatigue and fever.  HENT:  Positive for trouble swallowing. Negative for congestion, rhinorrhea, sinus pressure, sinus pain and sore throat.   Eyes:  Negative for pain.  Respiratory:  Negative for cough and shortness of breath.   Cardiovascular:  Negative for chest pain, palpitations and leg swelling.  Gastrointestinal:  Negative for abdominal pain, blood in stool, diarrhea, nausea and vomiting.  Genitourinary:  Negative for flank pain, frequency and penile pain.  Musculoskeletal:  Negative for back pain.  Neurological:  Negative for headaches.   History Past Medical History:  Diagnosis Date   Back pain    Bone spur    left foot    Chest pain    CHF (congestive heart failure) ([place])    Constipation    COVID-19    Diabetes mellitus type 2 in obese ([place]) 07/13/2013   Diverticulosis    Dyspnea    Erectile dysfunction 01/15/2013   Esophageal reflux 01/15/2013   Fatty liver 07/04/2015   GERD (gastroesophageal reflux disease)    Hiatal hernia    Hiatal hernia with gastroesophageal reflux 03/20/2014   Hyperglycemia 07/13/2013   Hyperplastic colon polyp    Hypertension    Internal hemorrhoids    Lower extremity edema    Mixed hyperlipidemia    Murmur    OSA (obstructive sleep apnea)    s/p UPPP   Plantar fasciitis    Pneumonia due to COVID-19 virus    Prediabetes    Reflux esophagitis    Shoulder pain    Stomach ulcer    from PCP   Urinary hesitancy 09/30/2015   Vitamin D deficiency     He has a past surgical history that includes Tonsillectomy; Uvulectomy; Esophageal manometry (N/A, 09/01/2013); Esophageal manometry (N/A, 02/28/2016); 24 hour ph study (N/A, 02/28/2016); Umbilical hernia repair; Inguinal hernia repair; and Foot tendon transfer (Right).   His family history includes Alcoholism in his father; Cancer in his brother; Dementia in his mother; Depression in his mother; Diabetes in his mother, sister, and sister; Heart attack in his maternal  grandmother; Heart disease in his mother; Heart failure in his mother; Hyperlipidemia in his mother; Hypertension in his mother and another family member; Obesity in his father and mother; Pancreatic disease in his sister; Stroke in his mother.He reports that he has never smoked. He has never used smokeless tobacco. He reports that he does not drink alcohol and does not use drugs.  Current Outpatient Medications on File Prior to Visit  Medication Sig Dispense Refill   acetaminophen (TYLENOL) 500 MG tablet Take 1 tablet (500 mg total) by mouth every 6 (six) hours as needed. 60 tablet 0   albuterol (VENTOLIN HFA) 108 (90 Base) MCG/ACT inhaler Inhale 2 puffs into the lungs every  4 (four) hours as needed for wheezing or shortness of breath. 18 g 1   albuterol (VENTOLIN HFA) 108 (90 Base) MCG/ACT inhaler Inhale 2 puffs into the lungs every 6 (six) hours as needed. 18 g 0   amLODipine (NORVASC) 10 MG tablet Take 1 tab every evening. 90 tablet 1   ascorbic acid (VITAMIN C) 500 MG tablet Take 500 mg by mouth daily.     aspirin EC 81 MG tablet Take 81 mg by mouth daily. Swallow whole.     Bayer Microlet Lancets lancets Test blood sugars twice daily 100 each 0   Blood Glucose Monitoring Suppl (ACCU-CHEK GUIDE ME) w/Device KIT      Blood Glucose Monitoring Suppl (CONTOUR NEXT MONITOR) w/Device KIT 1 kit by Does not apply route 2 (two) times daily. 1 kit 0   buPROPion (WELLBUTRIN SR) 150 MG 12 hr tablet Take 1 tablet by mouth once daily 30 tablet 0   carvedilol (COREG) 25 MG tablet Take 1 tablet (25 mg total) by mouth 2 (two) times daily with a meal. 60 tablet 3   Cholecalciferol (VITAMIN D) 50 MCG (2000 UT) CAPS Take 1 capsule (2,000 Units total) by mouth daily. 90 capsule 0   fluticasone (FLONASE) 50 MCG/ACT nasal spray Use 2 spray(s) in each nostril once daily 16 g 0   glucose blood (CONTOUR NEXT TEST) test strip Test blood sugars twice daily 100 each 0   HYDROmorphone (DILAUDID) 4 MG tablet Take 1 tablet (4 mg total) by mouth every 4 (four) hours as needed for severe pain. 25 tablet 0   ibuprofen (ADVIL) 800 MG tablet Take 1 tablet (800 mg total) by mouth 3 (three) times daily. 90 tablet 1   ibuprofen (ADVIL) 800 MG tablet Take 1 tablet by mouth 3 (three) times daily.     losartan (COZAAR) 25 MG tablet Take 1 tablet (25 mg total) by mouth daily. 14 tablet 0   metFORMIN (GLUCOPHAGE) 500 MG tablet TAKE 1 TABLET BY MOUTH TWICE DAILY WITH A MEAL 60 tablet 0   Misc. Devices (PULSE OXIMETER FOR FINGER) MISC 1 Device by Does not apply route as needed (SOB, fatigue, headache patient with COVID). 1 each 0   Multiple Vitamins-Minerals (MULTIVITAMIN WITH MINERALS) tablet Take 1 tablet by  mouth daily. 100 tablet 5   rosuvastatin (CRESTOR) 40 MG tablet Take 1 tablet (40 mg total) by mouth daily. 90 tablet 1   tadalafil (CIALIS) 5 MG tablet Take 1 tablet (5 mg total) by mouth daily as needed for erectile dysfunction. 30 tablet 0   tamsulosin (FLOMAX) 0.4 MG CAPS capsule Take 1 capsule by mouth once daily 30 capsule 0   triamterene-hydrochlorothiazide (MAXZIDE-25) 37.5-25 MG tablet Take 1 tablet by mouth daily. 90 tablet 1   Vitamin D, Ergocalciferol, (DRISDOL) 1.25 MG (50000  UNIT) CAPS capsule TAKE 1 CAPSULE BY MOUTH ONE TIME PER WEEK 12 capsule 0   zinc gluconate 50 MG tablet Take 50 mg by mouth daily.     melatonin 5 MG TABS Take 5 mg by mouth at bedtime. (Patient not taking: Reported on 06/20/2021)     [DISCONTINUED] amitriptyline (ELAVIL) 25 MG tablet Take 1 tablet (25 mg total) by mouth at bedtime. (Patient not taking: No sig reported) 30 tablet 2   No current facility-administered medications on file prior to visit.     Objective:  Objective  Physical Exam Constitutional:      General: He is not in acute distress.    Appearance: Normal appearance. He is not ill-appearing or toxic-appearing.  HENT:     Head: Normocephalic and atraumatic.     Right Ear: Tympanic membrane, ear canal and external ear normal.     Left Ear: Tympanic membrane, ear canal and external ear normal.     Nose: No congestion or rhinorrhea.  Eyes:     Extraocular Movements: Extraocular movements intact.     Pupils: Pupils are equal, round, and reactive to light.  Cardiovascular:     Rate and Rhythm: Normal rate and regular rhythm.     Pulses: Normal pulses.     Heart sounds: Normal heart sounds. No murmur heard. Pulmonary:     Effort: Pulmonary effort is normal. No respiratory distress.     Breath sounds: Normal breath sounds. No wheezing, rhonchi or rales.  Abdominal:     General: Bowel sounds are normal.     Palpations: Abdomen is soft. There is no mass.     Tenderness: There is no  abdominal tenderness. There is no guarding.     Hernia: No hernia is present.  Musculoskeletal:        General: Normal range of motion.     Cervical back: Normal range of motion and neck supple.  Skin:    General: Skin is warm and dry.  Neurological:     Mental Status: He is alert and oriented to person, place, and time.  Psychiatric:        Behavior: Behavior normal.   BP 128/86   Pulse 74   Temp 98.1 F (36.7 C)   Resp 16   Wt 280 lb (127 kg)   SpO2 97%   BMI 37.97 kg/m  Wt Readings from Last 3 Encounters:  07/19/21 280 lb (127 kg)  03/29/21 271 lb 9.6 oz (123.2 kg)  12/15/20 272 lb 12.8 oz (123.7 kg)     Lab Results  Component Value Date   WBC 4.0 07/19/2021   HGB 15.0 07/19/2021   HCT 45.0 07/19/2021   PLT 183.0 07/19/2021   GLUCOSE 112 (H) 07/19/2021   CHOL 203 (H) 07/19/2021   TRIG 144.0 07/19/2021   HDL 39.20 07/19/2021   LDLDIRECT 181.0 08/12/2020   LDLCALC 135 (H) 07/19/2021   ALT 30 07/19/2021   AST 21 07/19/2021   NA 138 07/19/2021   K 3.7 07/19/2021   CL 105 07/19/2021   CREATININE 0.78 07/19/2021   BUN 11 07/19/2021   CO2 25 07/19/2021   TSH 4.03 07/19/2021   PSA 3.64 12/08/2019   HGBA1C 7.4 (H) 07/19/2021   MICROALBUR 4.1 (H) 08/12/2020    DG Chest 2 View  Result Date: 02/02/2021 CLINICAL DATA:  Cough and dyspnea. EXAM: CHEST - 2 VIEW COMPARISON:  10/22/2019 FINDINGS: The heart size and mediastinal contours are within normal limits. Both lungs are clear. The  visualized skeletal structures are unremarkable. IMPRESSION: No active cardiopulmonary disease. Electronically Signed   By: Franchot Gallo M.D.   On: 02/02/2021 11:14     Assessment & Plan:  Plan    Meds ordered this encounter  Medications   tiZANidine (ZANAFLEX) 2 MG tablet    Sig: Take 0.5-2 tablets (1-4 mg total) by mouth at bedtime as needed for muscle spasms.    Dispense:  20 tablet    Refill:  1   esomeprazole (NEXIUM) 40 MG capsule    Sig: Take 1 capsule (40 mg total) by  mouth daily.    Dispense:  30 capsule    Refill:  3   famotidine (PEPCID) 40 MG tablet    Sig: Take 1 tablet (40 mg total) by mouth daily.    Dispense:  90 tablet    Refill:  1    Problem List Items Addressed This Visit     Hyperlipidemia (Chronic)    Encourage heart healthy diet such as MIND or DASH diet, increase exercise, avoid trans fats, simple carbohydrates and processed foods, consider a krill or fish or flaxseed oil cap daily.       Relevant Orders   Lipid panel (Completed)   Essential hypertension (Chronic)    Well controlled, no changes to meds. Encouraged heart healthy diet such as the DASH diet and exercise as tolerated.       Relevant Orders   CBC (Completed)   Lipid panel (Completed)   TSH (Completed)   Obesity    Encouraged DASH or MIND diet, decrease po intake and increase exercise as tolerated. Needs 7-8 hours of sleep nightly. Avoid trans fats, eat small, frequent meals every 4-5 hours with lean proteins, complex carbs and healthy fats. Minimize simple carbs, high fat foods and processed foods.       Diabetes mellitus type 2 in obese [facility])    Consider addition of Mounjaro to improve sugar and weight. He will check with his insurance and let us know if he wants to proceed. hgba1c acceptable, minimize simple carbs. Increase exercise as tolerated. Continue current meds      Relevant Orders   Hemoglobin A1c (Completed)   Comprehensive metabolic panel (Completed)   Ambulatory referral to Ophthalmology   Scleroderma of esophagus ([place])   Relevant Orders   Ambulatory referral to Gastroenterology   Hiatal hernia with gastroesophageal reflux   Relevant Medications   esomeprazole (NEXIUM) 40 MG capsule   famotidine (PEPCID) 40 MG tablet   Other Relevant Orders   Ambulatory referral to Gastroenterology   Sleep apnea    Is now awaiting CPAP due to supply chain issues      Atypical chest pain   Relevant Orders   Ambulatory referral to Gastroenterology    Vitamin D deficiency    Supplement and monitor      Relevant Orders   VITAMIN D 25 Hydroxy (Vit-D Deficiency, Fractures) (Completed)   Urinary frequency   Other Visit Diagnoses     Dysphagia, unspecified type    -  Primary   Relevant Orders   Ambulatory referral to Gastroenterology   Hyperlipidemia associated with type 2 diabetes mellitus ([place])       Decreased visual acuity       Relevant Orders   Ambulatory referral to Ophthalmology       Follow-up: Return in about 3 months (around 10/19/2021) for f/u visit.  I, Suezanne Jacquet, acting as a scribe for Penni Homans, MD, have documented all relevent documentation on  behalf of Penni Homans, MD, as directed by Penni Homans, MD while in the presence of Penni Homans, MD. DO:07/19/21.  I, [name], MD personally performed the services described in this documentation. All medical record entries made by the scribe were at my direction and in my presence. I have reviewed the chart and agree that the record reflects my personal performance and is accurate and complete

## 2021-07-19 NOTE — Assessment & Plan Note (Signed)
Consider addition of Mounjaro to improve sugar and weight. He will check with his insurance and let us know if he wants to proceed. hgba1c acceptable, minimize simple carbs. Increase exercise as tolerated. Continue current meds

## 2021-07-20 ENCOUNTER — Ambulatory Visit: Payer: No Typology Code available for payment source

## 2021-07-20 ENCOUNTER — Other Ambulatory Visit: Payer: Self-pay

## 2021-07-20 DIAGNOSIS — E559 Vitamin D deficiency, unspecified: Secondary | ICD-10-CM

## 2021-07-20 MED ORDER — VITAMIN D (ERGOCALCIFEROL) 1.25 MG (50000 UNIT) PO CAPS: ORAL_CAPSULE | ORAL | 3 refills | Status: DC

## 2021-07-21 ENCOUNTER — Telehealth: Payer: Self-pay | Admitting: *Deleted

## 2021-07-21 NOTE — Telephone Encounter (Signed)
Request Reference Number: [id number]. ESOMEPRA MAG CAP 40MG  DR is approved through 07/21/2022

## 2021-07-21 NOTE — Telephone Encounter (Signed)
Request Reference Number: [id number]. FAMOTIDINE TAB 40MG  is approved through 07/21/2022.

## 2021-07-21 NOTE — Telephone Encounter (Signed)
Prior auth started via cover my meds.  Awaiting determination.  Key: [id number]

## 2021-07-21 NOTE — Telephone Encounter (Signed)
Prior auth started via cover my meds.  Awaiting determination.   Key: [id number]

## 2021-07-22 ENCOUNTER — Telehealth: Payer: Self-pay | Admitting: Family Medicine

## 2021-07-22 ENCOUNTER — Other Ambulatory Visit: Payer: Self-pay | Admitting: Family Medicine

## 2021-07-22 ENCOUNTER — Telehealth: Payer: Self-pay

## 2021-07-22 ENCOUNTER — Other Ambulatory Visit: Payer: Self-pay

## 2021-07-22 MED ORDER — TIRZEPATIDE 2.5 MG/0.5ML ~~LOC~~ SOAJ: 2.5000 mg | SUBCUTANEOUS | 0 refills | Status: DC

## 2021-07-22 MED ORDER — TIRZEPATIDE 5 MG/0.5ML ~~LOC~~ SOAJ: 5.0000 mg | SUBCUTANEOUS | 0 refills | Status: DC

## 2021-07-22 NOTE — Telephone Encounter (Signed)
Spoke pt

## 2021-07-22 NOTE — Telephone Encounter (Signed)
Pt.called and stated that he spoke with his insurance regarding getting the medicine Mounjaro and he stated it is covered under his insurance.

## 2021-07-22 NOTE — Telephone Encounter (Signed)
Pt called back to continue going over  tirzepatide [facility]) 2.5 MG/0.5ML Pen  please advise.

## 2021-07-22 NOTE — Telephone Encounter (Signed)
Pt aware.

## 2021-07-22 NOTE — Telephone Encounter (Signed)
Started PA for [facility] 2.5MG /0.5ML pen-injectors Key: [id number]  Awaiting responsse

## 2021-08-10 ENCOUNTER — Ambulatory Visit (HOSPITAL_BASED_OUTPATIENT_CLINIC_OR_DEPARTMENT_OTHER)
Admission: RE | Admit: 2021-08-10 | Discharge: 2021-08-10 | Disposition: A | Discharge status: Home / Self Care | Payer: No Typology Code available for payment source | Source: Ambulatory Visit | Attending: Medical | Admitting: Medical

## 2021-08-10 ENCOUNTER — Encounter: Payer: Self-pay | Admitting: Medical

## 2021-08-10 ENCOUNTER — Other Ambulatory Visit: Payer: Self-pay

## 2021-08-10 ENCOUNTER — Ambulatory Visit (INDEPENDENT_AMBULATORY_CARE_PROVIDER_SITE_OTHER): Payer: No Typology Code available for payment source | Admitting: Medical

## 2021-08-10 VITALS — BP 170/95 | HR 69 | Temp 98.4°F | Resp 18 | Ht 73.0 in | Wt 277.0 lb

## 2021-08-10 DIAGNOSIS — M545 Low back pain, unspecified: Secondary | ICD-10-CM

## 2021-08-10 DIAGNOSIS — M25559 Pain in unspecified hip: Secondary | ICD-10-CM

## 2021-08-10 DIAGNOSIS — E1169 Type 2 diabetes mellitus with other specified complication: Secondary | ICD-10-CM

## 2021-08-10 DIAGNOSIS — I10 Essential (primary) hypertension: Secondary | ICD-10-CM | POA: Diagnosis not present

## 2021-08-10 DIAGNOSIS — G8929 Other chronic pain: Secondary | ICD-10-CM

## 2021-08-10 DIAGNOSIS — E669 Obesity, unspecified: Secondary | ICD-10-CM

## 2021-08-10 MED ORDER — LOSARTAN POTASSIUM 50 MG PO TABS: 50.0000 mg | ORAL_TABLET | Freq: Every day | ORAL | 11 refills | Status: DC

## 2021-08-10 MED ORDER — GABAPENTIN 100 MG PO CAPS: 100.0000 mg | ORAL_CAPSULE | Freq: Every day | ORAL | 0 refills | Status: DC

## 2021-08-10 NOTE — Patient Instructions (Addendum)
Lidocaine patch to lower back, muscle relaxant tinazadine, gabapentin and add tylenol. Nsaids not good option due to severe htn. Steroids not good option due to diabetes. Narcotics not good option due to allergies.  Discussed why toradol injectoin not good option since bp high and is nsaid.  Will get left hip xray today.   Htn- severe high. Continue current meds but adding losartan 50 mg to your 3 med regimen.  For diabetes continue current medication.   Follow up in 7-10 days or sooner if needed.  Recommend take bp cuff on vacation and check bp daily to confirm coming done.

## 2021-08-10 NOTE — Progress Notes (Signed)
Subjective:    Patient ID: [name] Foster, male    DOB: 18-Oct-1965, 55 y.o.   MRN: [id number]  HPI  Pt in for some back pain. He states had history of back pain for years. History of back pain and shoulder pain which were reasons for disability.  Pt states overall 5 years of back pain. Pt states pain does flare in past with minimal activity.   Recently pain got worse after yard work raking and blowing leaves about one week. Pain has been present since one week ago. He states when he moves and changes position pain level 9/10. At rest pain is level 7/10.   Also states some left hip pain and some groin pain on both sides.  Pt tried 800 mg of ibuprofen. He states if used would bring pain level to 4-5/10.    Pt also had used diclofenac 2 days. Pt made aware same class/nsaid as ibuporfen.  Pt has htn. He states he tooks bp meds today. He is on losartan 25 mg daily, amlodipine 10 mg daily. Coreg 25 mg bid. He is also on  triamenterine.   On review pt did have losartan 25 mg in the past. Last rx was for 2 weeks. Pt states it did help. He thinks he is not on losartan presently.  Pt has diabetes- last a1c 3 weeks ago was 7.4.   Pt about to go on cruise. Leaving tomorrow am from [employer] out.  Allergy/itching to tramadol, hydromporphone and tramadol  Review of Systems  Constitutional:  Negative for chills, fatigue and fever.  Respiratory:  Negative for cough, chest tightness, shortness of breath and wheezing.   Cardiovascular:  Negative for chest pain and palpitations.  Gastrointestinal:  Negative for abdominal pain.  Genitourinary:  Negative for dysuria.  Musculoskeletal:  Positive for back pain. Negative for myalgias, neck pain and neck stiffness.  Skin:  Negative for rash.  Neurological:  Negative for dizziness, seizures, syncope, speech difficulty, weakness, light-headedness and headaches.  Hematological:  Negative for adenopathy. Does not bruise/bleed easily.   Psychiatric/Behavioral:  Negative for behavioral problems and decreased concentration.     Past Medical History:  Diagnosis Date   Back pain    Bone spur    left foot   Chest pain    CHF (congestive heart failure) ([name])    Constipation    COVID-19    Diabetes mellitus type 2 in obese ([place]) 07/13/2013   Diverticulosis    Dyspnea    Erectile dysfunction 01/15/2013   Esophageal reflux 01/15/2013   Fatty liver 07/04/2015   GERD (gastroesophageal reflux disease)    Hiatal hernia    Hiatal hernia with gastroesophageal reflux 03/20/2014   Hyperglycemia 07/13/2013   Hyperplastic colon polyp    Hypertension    Internal hemorrhoids    Lower extremity edema    Mixed hyperlipidemia    Murmur    OSA (obstructive sleep apnea)    s/p UPPP   Plantar fasciitis    Pneumonia due to COVID-19 virus    Prediabetes    Reflux esophagitis    Shoulder pain    Stomach ulcer    from PCP   Urinary hesitancy 09/30/2015   Vitamin D deficiency      Social History   Socioeconomic History   Marital status: Married    Spouse name: [name]   Number of children: Not on file   Years of education: Not on file   Highest education level: Not on file  Occupational  History   Occupation: Nature conservation officer: [employer]  Tobacco Use   Smoking status: Never   Smokeless tobacco: Never  Vaping Use   Vaping Use: Never used  Substance and Sexual Activity   Alcohol use: No   Drug use: No   Sexual activity: Yes    Partners: Female  Other Topics Concern   Not on file  Social History Narrative   Tobacco Use - No.    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[place])   grew up in [place] area   Married - 13 years   Alcohol Use - no   Regular Exercise - yes   Drug Use - no   3 girls    3 boys   Smoking Status:      Packs/Day:     Caffeine use/day:  1 cup coffee every other day   Does Patient Exercise:  no   Social Determinants of Health   Financial Resource Strain: Not on file  Food  Insecurity: Not on file  Transportation Needs: Not on file  Physical Activity: Not on file  Stress: Not on file  Social Connections: Not on file  Intimate Partner Violence: Not on file    Past Surgical History:  Procedure Laterality Date   24 HOUR [place] STUDY N/A 02/28/2016   Procedure: [place];  Surgeon: Jerene Bears, MD;  Location: [facility];  Service: Gastroenterology;  Laterality: N/A;   ESOPHAGEAL MANOMETRY N/A 09/01/2013   Procedure: ESOPHAGEAL MANOMETRY (EM);  Surgeon: Sable Feil, MD;  Location: [facility];  Service: Endoscopy;  Laterality: N/A;   ESOPHAGEAL MANOMETRY N/A 02/28/2016   Procedure: ESOPHAGEAL MANOMETRY (EM);  Surgeon: Jerene Bears, MD;  Location: [facility];  Service: Gastroenterology;  Laterality: N/A;   FOOT TENDON TRANSFER Right    INGUINAL HERNIA REPAIR     TONSILLECTOMY     UMBILICAL HERNIA REPAIR     UVULECTOMY      Family History  Problem Relation Age of Onset   Dementia Mother    Diabetes Mother    Hypertension Mother    Heart failure Mother    Stroke Mother    Obesity Mother    Hyperlipidemia Mother    Heart disease Mother    Depression Mother    Hypertension Other        siblings   Diabetes Sister    Cancer Brother        lung cancer smoker   Heart attack Maternal Grandmother    Diabetes Sister    Pancreatic disease Sister    Obesity Father    Alcoholism Father    Colon cancer Neg Hx     Allergies  Allergen Reactions   Hydrocodone Itching   Hydromorphone Itching   Tramadol Itching    Current Outpatient Medications on File Prior to Visit  Medication Sig Dispense Refill   acetaminophen (TYLENOL) 500 MG tablet Take 1 tablet (500 mg total) by mouth every 6 (six) hours as needed. 60 tablet 0   albuterol (VENTOLIN HFA) 108 (90 Base) MCG/ACT inhaler Inhale 2 puffs into the lungs every 4 (four) hours as needed for wheezing or shortness of breath. 18 g 1   albuterol (VENTOLIN HFA) 108 (90 Base) MCG/ACT inhaler Inhale 2  puffs into the lungs every 6 (six) hours as needed. 18 g 0   amLODipine (NORVASC) 10 MG tablet Take 1 tab every evening. 90 tablet 1   ascorbic acid (VITAMIN C)  500 MG tablet Take 500 mg by mouth daily.     aspirin EC 81 MG tablet Take 81 mg by mouth daily. Swallow whole.     Bayer Microlet Lancets lancets Test blood sugars twice daily 100 each 0   Blood Glucose Monitoring Suppl (ACCU-CHEK GUIDE ME) w/Device KIT      Blood Glucose Monitoring Suppl (CONTOUR NEXT MONITOR) w/Device KIT 1 kit by Does not apply route 2 (two) times daily. 1 kit 0   buPROPion (WELLBUTRIN SR) 150 MG 12 hr tablet Take 1 tablet by mouth once daily 30 tablet 0   carvedilol (COREG) 25 MG tablet Take 1 tablet (25 mg total) by mouth 2 (two) times daily with a meal. 60 tablet 3   Cholecalciferol (VITAMIN D) 50 MCG (2000 UT) CAPS Take 1 capsule (2,000 Units total) by mouth daily. 90 capsule 0   esomeprazole (NEXIUM) 40 MG capsule Take 1 capsule (40 mg total) by mouth daily. 30 capsule 3   famotidine (PEPCID) 40 MG tablet Take 1 tablet (40 mg total) by mouth daily. 90 tablet 1   fluticasone (FLONASE) 50 MCG/ACT nasal spray Use 2 spray(s) in each nostril once daily 16 g 0   glucose blood (CONTOUR NEXT TEST) test strip Test blood sugars twice daily 100 each 0   HYDROmorphone (DILAUDID) 4 MG tablet Take 1 tablet (4 mg total) by mouth every 4 (four) hours as needed for severe pain. 25 tablet 0   ibuprofen (ADVIL) 800 MG tablet Take 1 tablet (800 mg total) by mouth 3 (three) times daily. 90 tablet 1   ibuprofen (ADVIL) 800 MG tablet Take 1 tablet by mouth 3 (three) times daily.     melatonin 5 MG TABS Take 5 mg by mouth at bedtime.     metFORMIN (GLUCOPHAGE) 500 MG tablet TAKE 1 TABLET BY MOUTH TWICE DAILY WITH A MEAL 60 tablet 0   Misc. Devices (PULSE OXIMETER FOR FINGER) MISC 1 Device by Does not apply route as needed (SOB, fatigue, headache patient with COVID). 1 each 0   Multiple Vitamins-Minerals (MULTIVITAMIN WITH MINERALS)  tablet Take 1 tablet by mouth daily. 100 tablet 5   rosuvastatin (CRESTOR) 40 MG tablet Take 1 tablet (40 mg total) by mouth daily. 90 tablet 1   tadalafil (CIALIS) 5 MG tablet Take 1 tablet (5 mg total) by mouth daily as needed for erectile dysfunction. 30 tablet 0   tamsulosin (FLOMAX) 0.4 MG CAPS capsule Take 1 capsule by mouth once daily 30 capsule 0   tirzepatide (MOUNJARO) 2.5 MG/0.5ML Pen Inject 2.5 mg into the skin once a week. 2 mL 0   [START ON 08/19/2021] tirzepatide (MOUNJARO) 5 MG/0.5ML Pen Inject 5 mg into the skin once a week. 6 mL 0   tiZANidine (ZANAFLEX) 2 MG tablet Take 0.5-2 tablets (1-4 mg total) by mouth at bedtime as needed for muscle spasms. 20 tablet 1   triamterene-hydrochlorothiazide (MAXZIDE-25) 37.5-25 MG tablet Take 1 tablet by mouth daily. 90 tablet 1   Vitamin D, Ergocalciferol, (DRISDOL) 1.25 MG (50000 UNIT) CAPS capsule Take one tablet by mouth weekly for 12 weeks 4 capsule 3   zinc gluconate 50 MG tablet Take 50 mg by mouth daily.     [DISCONTINUED] amitriptyline (ELAVIL) 25 MG tablet Take 1 tablet (25 mg total) by mouth at bedtime. (Patient not taking: No sig reported) 30 tablet 2   No current facility-administered medications on file prior to visit.    BP (!) 170/95   Pulse 69  Temp 98.4 F (36.9 C)   Resp 18   Ht _0  (1.854 m)   Wt 277 lb (125.6 kg)   SpO2 95%   BMI 36.55 kg/m       Objective:   Physical Exam  General Appearance- Not in acute distress.    Chest and Lung Exam Auscultation: Breath sounds:-Normal. Clear even and unlabored. Adventitious sounds:- No Adventitious sounds.  Cardiovascular Auscultation:Rythm - Regular, rate and rythm. Heart Sounds -Normal heart sounds.  Abdomen Inspection:-Inspection Normal.  Palpation/Perucssion: Palpation and Percussion of the abdomen reveal- Non Tender, No Rebound tenderness, No rigidity(Guarding) and No Palpable abdominal masses.  Liver:-Normal.  Spleen:- Normal.   Back Mid lumbar  spine tenderness to palpation. Pain on straight leg lift. Pain on lateral movements and flexion/extension of the spine.  Lower ext neurologic  L5-S1 sensation intact bilaterally. Normal patellar reflexes bilaterally. No foot drop bilaterally.   Left hip tendnerness to palpation and rom.  Genital- no inguinal hernia on exam.    Neurologic Cranial Nerve exam:- CN III-XII intact(No nystagmus), symmetric smile. Drift Test:- No drift. Finger to Nose:- Normal/Intact Strength:- 5/5 equal and symmetric strength both upper and lower extremities.       Assessment & Plan:   Patient Instructions  Lidocaine patch to lower back, muscle relaxant tinazadine, gabapentin and can use tylenol. Nsaids not good option due to severe htn. Steroids not good option due to diabetes. Narcotics not good option due to allergies.  Discussed why toradol injectoin not good option since bp high and is nsaid.  Will get left hip xray today.   Htn- severe high. Continue current meds but adding losartan 50 mg to your 3 med regimen.  For diabetes continue current medication.   Follow up in 7-10 days or sooner if needed.  Recommend take bp cuff on vacation and check bp daily to confirm coming done.     Mackie Pai, PA-C    Time spent with patient today was 40  minutes which consisted of chart revdiew, discussing diagnosis, work up treatment and documentation.  Extra time taken today explaining in depth how we will treat high-level pain in light of severe elevated blood pressure, diabetes and allergies to narcotics.  Also just about leave for cruise.

## 2021-08-11 ENCOUNTER — Encounter: Payer: Medicare Other | Admitting: Physical Therapy

## 2021-08-15 ENCOUNTER — Encounter: Payer: Medicare Other | Admitting: Physical Therapy

## 2021-08-22 ENCOUNTER — Telehealth: Payer: Self-pay | Admitting: Family Medicine

## 2021-08-22 NOTE — Telephone Encounter (Signed)
Pt's Spouse called again in follow up to her phone call into the office earlier this morning.  She was still very upset and I stated I was the supervisor and would attach another message to you regarding a possible work in for this pt.  Pt's wife is very concerned about his welfare as she is going in for knee surgery on Wednesday of this week and knows he will not be worried about himself and she wants to make sure pt is taken care of and his healthcare needs are addressed by his PCP so she does not have to worry about him while undergoing her surgery.  Please advise when/where we could possibly work pt in.  I advised Mrs. Etsitty I would call her back with an update as soon as I heard from you.

## 2021-08-22 NOTE — Telephone Encounter (Signed)
Pt. Wife called in and stated pt was seen by Percell Miller on 11/30. He stated to follow up with Dr.[name] in 7-10 days. I informed wife that Dr. Charlett Blake doesn't have any times available until next year. She stated that someone needs to get him on her schedule. She does not want him seeing anyone else and if no one can get him on her schedule she needs to speak to a supervisor. I informed her that we will get a message sent over to see if and when we could get him on her schedule. She stated she has surgery Wednesday and hopes he can been seen before then. Please advise.

## 2021-08-22 NOTE — Telephone Encounter (Signed)
Pt had seen [name] Foster on 08/10/21 for hip & back pain and his BP was high.  He was to follow up in 7-10 days, after returning from a cruise and his Spouse called in today and was demanding that pt was to see his PCP and no one else in the practice and if pt did not see his PCP she would go over my head, over the administrator's head and then others to have him seen for a follow up visit with his PCP.  I am trying to secure an appointment for the pt with his PCP after this conversation with his Spouse.  I can schedule a virtual visit on Wednesday if that works for you.  His Spouse is having surgery that day and did not provide me with the time.

## 2021-08-23 NOTE — Telephone Encounter (Signed)
Pt scheduled  

## 2021-08-25 ENCOUNTER — Telehealth (INDEPENDENT_AMBULATORY_CARE_PROVIDER_SITE_OTHER): Payer: No Typology Code available for payment source | Admitting: Family Medicine

## 2021-08-25 DIAGNOSIS — E1169 Type 2 diabetes mellitus with other specified complication: Secondary | ICD-10-CM

## 2021-08-25 DIAGNOSIS — E669 Obesity, unspecified: Secondary | ICD-10-CM

## 2021-08-25 DIAGNOSIS — E785 Hyperlipidemia, unspecified: Secondary | ICD-10-CM | POA: Diagnosis not present

## 2021-08-25 DIAGNOSIS — I10 Essential (primary) hypertension: Secondary | ICD-10-CM | POA: Diagnosis not present

## 2021-08-25 DIAGNOSIS — M545 Low back pain, unspecified: Secondary | ICD-10-CM

## 2021-08-25 DIAGNOSIS — E559 Vitamin D deficiency, unspecified: Secondary | ICD-10-CM | POA: Diagnosis not present

## 2021-08-25 DIAGNOSIS — G8929 Other chronic pain: Secondary | ICD-10-CM

## 2021-08-25 MED ORDER — GABAPENTIN 100 MG PO CAPS
100.0000 mg | ORAL_CAPSULE | Freq: Every day | ORAL | 0 refills | Status: DC
Start: 2021-08-25 — End: 2021-12-08

## 2021-08-25 MED ORDER — TIRZEPATIDE 7.5 MG/0.5ML ~~LOC~~ SOAJ: 7.5000 mg | SUBCUTANEOUS | 0 refills | Status: DC

## 2021-08-25 MED ORDER — TIRZEPATIDE 10 MG/0.5ML ~~LOC~~ SOAJ: 10.0000 mg | SUBCUTANEOUS | 0 refills | Status: DC

## 2021-08-25 MED ORDER — METHYLPREDNISOLONE 4 MG PO TABS: ORAL_TABLET | ORAL | 0 refills | Status: DC
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
## 2021-08-25 NOTE — Assessment & Plan Note (Signed)
Encourage heart healthy diet such as MIND or DASH diet, increase exercise, avoid trans fats, simple carbohydrates and processed foods, consider a krill or fish or flaxseed oil cap daily.  °
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
## 2021-08-25 NOTE — Assessment & Plan Note (Signed)
Supplement and monitor 

## 2021-08-25 NOTE — Assessment & Plan Note (Signed)
Tolerating Mounjaro but has not picked up his 5 mg dose. He will pick up and increase to 7.5 next month and 10 the following month unless side effects develop. No concerns presently. Sugars 98 to 110. hgba1c acceptable, minimize simple carbs. Increase exercise as tolerated. Continue current meds

## 2021-08-25 NOTE — Assessment & Plan Note (Signed)
Generally 130s to 154 over 80s and 90s. Instructed on resting BP with legs uncrossed, relaxed. Check and document regularly and if perpetually runs over 140/90 will increase his Losartan to 50 mg in am and 25 mg in pm and continue to monitor. Avoid sodium, caffeine, alcohol and get regular sleep and exercise. hydrate

## 2021-09-16 ENCOUNTER — Ambulatory Visit (INDEPENDENT_AMBULATORY_CARE_PROVIDER_SITE_OTHER): Payer: No Typology Code available for payment source | Admitting: Family

## 2021-09-16 ENCOUNTER — Ambulatory Visit (HOSPITAL_BASED_OUTPATIENT_CLINIC_OR_DEPARTMENT_OTHER)
Admission: RE | Admit: 2021-09-16 | Discharge: 2021-09-16 | Disposition: A | Discharge status: Home / Self Care | Payer: No Typology Code available for payment source | Source: Ambulatory Visit | Attending: Family | Admitting: Family

## 2021-09-16 ENCOUNTER — Encounter: Payer: Self-pay | Admitting: Family

## 2021-09-16 ENCOUNTER — Other Ambulatory Visit: Payer: Self-pay

## 2021-09-16 VITALS — BP 150/80 | HR 70 | Temp 98.5°F | Ht 72.0 in | Wt 273.0 lb

## 2021-09-16 DIAGNOSIS — R0789 Other chest pain: Secondary | ICD-10-CM | POA: Insufficient documentation

## 2021-09-16 DIAGNOSIS — K219 Gastro-esophageal reflux disease without esophagitis: Secondary | ICD-10-CM

## 2021-09-16 DIAGNOSIS — I10 Essential (primary) hypertension: Secondary | ICD-10-CM | POA: Diagnosis not present

## 2021-09-16 LAB — CBC WITH DIFFERENTIAL/PLATELET
Basophils Absolute: 0 10*3/uL (ref 0.0–0.1)
Basophils Relative: 0.5 % (ref 0.0–3.0)
Eosinophils Absolute: 0 10*3/uL (ref 0.0–0.7)
Eosinophils Relative: 0.4 % (ref 0.0–5.0)
HCT: 46.6 % (ref 39.0–52.0)
Hemoglobin: 15.3 g/dL (ref 13.0–17.0)
Lymphocytes Relative: 18.1 % (ref 12.0–46.0)
Lymphs Abs: 1.4 10*3/uL (ref 0.7–4.0)
MCHC: 32.8 g/dL (ref 30.0–36.0)
MCV: 87.2 fl (ref 78.0–100.0)
Monocytes Absolute: 0.6 10*3/uL (ref 0.1–1.0)
Monocytes Relative: 7.9 % (ref 3.0–12.0)
Neutro Abs: 5.7 10*3/uL (ref 1.4–7.7)
Neutrophils Relative %: 73.1 % (ref 43.0–77.0)
Platelets: 249 10*3/uL (ref 150.0–400.0)
RBC: 5.34 Mil/uL (ref 4.22–5.81)
RDW: 14.7 % (ref 11.5–15.5)
WBC: 7.9 10*3/uL (ref 4.0–10.5)

## 2021-09-16 LAB — COMPREHENSIVE METABOLIC PANEL
ALT: 32 U/L (ref 0–53)
AST: 22 U/L (ref 0–37)
Albumin: 4.8 g/dL (ref 3.5–5.2)
Alkaline Phosphatase: 75 U/L (ref 39–117)
BUN: 19 mg/dL (ref 6–23)
CO2: 23 mEq/L (ref 19–32)
Calcium: 9.8 mg/dL (ref 8.4–10.5)
Chloride: 103 mEq/L (ref 96–112)
Creatinine, Ser: 1.07 mg/dL (ref 0.40–1.50)
GFR: 79.01 mL/min (ref >60.00)
Glucose, Bld: 82 mg/dL (ref 70–99)
Potassium: 4.1 mEq/L (ref 3.5–5.1)
Sodium: 137 mEq/L (ref 135–145)
Total Bilirubin: 0.4 mg/dL (ref 0.2–1.2)
Total Protein: 7.8 g/dL (ref 6.0–8.3)

## 2021-09-16 MED ORDER — LOSARTAN POTASSIUM 100 MG PO TABS: 100.0000 mg | ORAL_TABLET | Freq: Every day | ORAL | 0 refills | Status: DC

## 2021-09-16 MED ORDER — PANTOPRAZOLE SODIUM 40 MG PO TBEC: 40.0000 mg | DELAYED_RELEASE_TABLET | Freq: Two times a day (BID) | ORAL | 0 refills | Status: DC

## 2021-09-16 NOTE — Progress Notes (Signed)
[name] Foster is a 56 y.o. male with the following history as recorded in EpicCare:  Patient Active Problem List   Diagnosis Date Noted   Chronic bilateral low back pain without sciatica 08/12/2020   Superior labrum anterior-to-posterior (SLAP) tear of right shoulder 04/27/2020   Chronic right shoulder pain 03/31/2020   Chronic left shoulder pain 03/31/2020   Urinary frequency 12/10/2019   Dysuria 12/08/2019   Pneumonia due to COVID-19 virus 07/23/2019   Bilateral inguinal hernia without obstruction or gangrene 53/64/6803   Umbilical hernia without obstruction and without gangrene 04/11/2017   Vitamin D deficiency 01/24/2017   Depression 01/24/2017   Shortness of breath on exertion 11/27/2016   Internal hemorrhoids    Atypical chest pain    Gastroesophageal reflux disease without esophagitis    Preventative health care 10/10/2015   Sleep apnea 10/10/2015   Urinary hesitancy 09/30/2015   Fatty liver 07/04/2015   Edema 05/07/2014   Scleroderma of esophagus ([place]) 03/20/2014   Hiatal hernia with gastroesophageal reflux 03/20/2014   Diabetes mellitus type 2 in obese ([place]) 07/13/2013   Anxiety state 07/13/2013   Erectile dysfunction 01/15/2013   Back pain 03/09/2012   Bilateral foot pain 04/25/2010   Obesity 01/05/2010   Hyperlipidemia 10/04/2009   Essential hypertension 10/04/2009    Current Outpatient Medications  Medication Sig Dispense Refill   acetaminophen (TYLENOL) 500 MG tablet Take 1 tablet (500 mg total) by mouth every 6 (six) hours as needed. 60 tablet 0   albuterol (VENTOLIN HFA) 108 (90 Base) MCG/ACT inhaler Inhale 2 puffs into the lungs every 4 (four) hours as needed for wheezing or shortness of breath. 18 g 1   amLODipine (NORVASC) 10 MG tablet Take 1 tab every evening. 90 tablet 1   ascorbic acid (VITAMIN C) 500 MG tablet Take 500 mg by mouth daily.     aspirin EC 81 MG tablet Take 81 mg by mouth daily. Swallow whole.     Bayer Microlet Lancets lancets Test blood  sugars twice daily 100 each 0   Blood Glucose Monitoring Suppl (CONTOUR NEXT MONITOR) w/Device KIT 1 kit by Does not apply route 2 (two) times daily. 1 kit 0   carvedilol (COREG) 25 MG tablet Take 1 tablet (25 mg total) by mouth 2 (two) times daily with a meal. 60 tablet 3   Cholecalciferol (VITAMIN D) 50 MCG (2000 UT) CAPS Take 1 capsule (2,000 Units total) by mouth daily. 90 capsule 0   famotidine (PEPCID) 40 MG tablet Take 1 tablet (40 mg total) by mouth daily. 90 tablet 1   fluticasone (FLONASE) 50 MCG/ACT nasal spray Use 2 spray(s) in each nostril once daily 16 g 0   gabapentin (NEURONTIN) 100 MG capsule Take 1-2 capsules (100-200 mg total) by mouth at bedtime. 30 capsule 0   glucose blood (CONTOUR NEXT TEST) test strip Test blood sugars twice daily 100 each 0   ibuprofen (ADVIL) 800 MG tablet Take 1 tablet by mouth 3 (three) times daily.     Misc. Devices (PULSE OXIMETER FOR FINGER) MISC 1 Device by Does not apply route as needed (SOB, fatigue, headache patient with COVID). 1 each 0   Multiple Vitamins-Minerals (MULTIVITAMIN WITH MINERALS) tablet Take 1 tablet by mouth daily. 100 tablet 5   pantoprazole (PROTONIX) 40 MG tablet Take 1 tablet (40 mg total) by mouth 2 (two) times daily before a meal. 180 tablet 0   tadalafil (CIALIS) 5 MG tablet Take 1 tablet (5 mg total) by mouth daily as  needed for erectile dysfunction. 30 tablet 0   tamsulosin (FLOMAX) 0.4 MG CAPS capsule Take 1 capsule by mouth once daily 30 capsule 0   tirzepatide (MOUNJARO) 10 MG/0.5ML Pen Inject 10 mg into the skin once a week. 6 mL 0   tirzepatide (MOUNJARO) 5 MG/0.5ML Pen Inject 5 mg into the skin once a week. 6 mL 0   tirzepatide (MOUNJARO) 7.5 MG/0.5ML Pen Inject 7.5 mg into the skin once a week. 6 mL 0   tiZANidine (ZANAFLEX) 2 MG tablet Take 0.5-2 tablets (1-4 mg total) by mouth at bedtime as needed for muscle spasms. 20 tablet 1   triamterene-hydrochlorothiazide (MAXZIDE-25) 37.5-25 MG tablet Take 1 tablet by mouth  daily. 90 tablet 1   Vitamin D, Ergocalciferol, (DRISDOL) 1.25 MG (50000 UNIT) CAPS capsule Take one tablet by mouth weekly for 12 weeks 4 capsule 3   zinc gluconate 50 MG tablet Take 50 mg by mouth daily.     losartan (COZAAR) 100 MG tablet Take 1 tablet (100 mg total) by mouth daily. 90 tablet 0   metFORMIN (GLUCOPHAGE) 500 MG tablet TAKE 1 TABLET BY MOUTH TWICE DAILY WITH A MEAL (Patient not taking: Reported on 09/16/2021) 60 tablet 0   rosuvastatin (CRESTOR) 40 MG tablet Take 1 tablet (40 mg total) by mouth daily. (Patient not taking: Reported on 09/16/2021) 90 tablet 1   No current facility-administered medications for this visit.    Allergies: Hydrocodone, Hydromorphone, and Tramadol  Past Medical History:  Diagnosis Date   Back pain    Bone spur    left foot   Chest pain    CHF (congestive heart failure) ([place])    Constipation    COVID-19    Diabetes mellitus type 2 in obese ([place]) 07/13/2013   Diverticulosis    Dyspnea    Erectile dysfunction 01/15/2013   Esophageal reflux 01/15/2013   Fatty liver 07/04/2015   GERD (gastroesophageal reflux disease)    Hiatal hernia    Hiatal hernia with gastroesophageal reflux 03/20/2014   Hyperglycemia 07/13/2013   Hyperplastic colon polyp    Hypertension    Internal hemorrhoids    Lower extremity edema    Mixed hyperlipidemia    Murmur    OSA (obstructive sleep apnea)    s/p UPPP   Plantar fasciitis    Pneumonia due to COVID-19 virus    Prediabetes    Reflux esophagitis    Shoulder pain    Stomach ulcer    from PCP   Urinary hesitancy 09/30/2015   Vitamin D deficiency     Past Surgical History:  Procedure Laterality Date   61 HOUR [place] STUDY N/A 02/28/2016   Procedure: 24 HOUR [place] STUDY;  Surgeon: Jerene Bears, MD;  Location: [facility];  Service: Gastroenterology;  Laterality: N/A;   ESOPHAGEAL MANOMETRY N/A 09/01/2013   Procedure: ESOPHAGEAL MANOMETRY (EM);  Surgeon: Sable Feil, MD;  Location: [facility];  Service: Endoscopy;   Laterality: N/A;   ESOPHAGEAL MANOMETRY N/A 02/28/2016   Procedure: ESOPHAGEAL MANOMETRY (EM);  Surgeon: Jerene Bears, MD;  Location: [facility];  Service: Gastroenterology;  Laterality: N/A;   FOOT TENDON TRANSFER Right    INGUINAL HERNIA REPAIR     TONSILLECTOMY     UMBILICAL HERNIA REPAIR     UVULECTOMY      Family History  Problem Relation Age of Onset   Dementia Mother    Diabetes Mother    Hypertension Mother    Heart failure Mother  Stroke Mother    Obesity Mother    Hyperlipidemia Mother    Heart disease Mother    Depression Mother    Hypertension Other        siblings   Diabetes Sister    Cancer Brother        lung cancer smoker   Heart attack Maternal Grandmother    Diabetes Sister    Pancreatic disease Sister    Obesity Father    Alcoholism Father    Colon cancer Neg Hx     Social History   Tobacco Use   Smoking status: Never   Smokeless tobacco: Never  Substance Use Topics   Alcohol use: No    Subjective:  Patient presents with concerns for upper chest pain; upper back pain x 4 days; history of chronic low back pain- notes that this pain is "different" from his "normal lower" back pain; was originally suspicious that related to GERD- takes Nexium daily and Pepcid at night; sometimes even has to take Tums; no chest pain or shortness on exertion;      Objective:  Vitals:   09/16/21 1048 09/16/21 1123  BP: (!) 170/90 (!) 150/80  Pulse: 76 70  Temp: 98.5 F (36.9 C)   TempSrc: Oral   SpO2: 95%   Weight: 273 lb (123.8 kg)   Height: 6' (1.829 m)     General: Well developed, well nourished, in no acute distress  Skin : Warm and dry.  Head: Normocephalic and atraumatic  Eyes: Sclera and conjunctiva clear; pupils round and reactive to light; extraocular movements intact  Ears: External normal; canals clear; tympanic membranes normal  Oropharynx: Pink, supple. No suspicious lesions  Neck: Supple without thyromegaly, adenopathy  Lungs: Respirations  unlabored; clear to auscultation bilaterally without wheeze, rales, rhonchi  CVS exam: normal rate and regular rhythm.  Abdomen: Soft; nontender; nondistended; normoactive bowel sounds; no masses or hepatosplenomegaly  Neurologic: Alert and oriented; speech intact; face symmetrical; moves all extremities well; CNII-XII intact without focal deficit   Assessment:  1. Atypical chest pain   2. Essential hypertension   3. Gastroesophageal reflux disease, unspecified whether esophagitis present     Plan:  Update EKG today- shows NSR; update CXR today; check d-dimer today;  Uncontrolled; increase Losartan to 100 mg; he will take this and Carvedilol in the am; plan to take Amlodipine and Carvedilol in the pm; follow up with his PCP as scheduled for next month; Change to Protonix 40 mg bid; to consider abdominal ultrasound and/or GI consult; follow up to be determined;  This visit occurred during the SARS-CoV-2 public health emergency.  Safety protocols were in place, including screening questions prior to the visit, additional usage of staff PPE, and extensive cleaning of exam room while observing appropriate contact time as indicated for disinfecting solutions.    No follow-ups on file.  Orders Placed This Encounter  Procedures   DG Chest 2 View    Standing Status:   Future    Number of Occurrences:   1    Standing Expiration Date:   09/16/2022    Order Specific Question:   Reason for Exam (SYMPTOM  OR DIAGNOSIS REQUIRED)    Answer:   atypical chest pain    Order Specific Question:   Preferred imaging location?    Answer:   [facility]   Comp Met (CMET)   CBC with Differential/Platelet   D-Dimer, Quantitative   EKG 12-Lead    Requested Prescriptions   Signed Prescriptions  Disp Refills   losartan (COZAAR) 100 MG tablet 90 tablet 0    Sig: Take 1 tablet (100 mg total) by mouth daily.   pantoprazole (PROTONIX) 40 MG tablet 180 tablet 0    Sig: Take 1 tablet (40 mg total) by mouth  2 (two) times daily before a meal.

## 2021-09-16 NOTE — Patient Instructions (Signed)
Let's get a CXR today; we will also need to consider an abdominal ultrasound;  For your GERD- let's try changing to Protonix twice a day; this would replace your Nexium; you can still use Famotidine if needed;  For your blood pressure-  Take Losartan 100 mg and Coreg in the am; Take Amlodipine and Coreg in the pm;

## 2021-09-17 LAB — D-DIMER, QUANTITATIVE: D-Dimer, Quant: 0.42 mcg/mL FEU (ref <0.50)

## 2021-09-20 ENCOUNTER — Other Ambulatory Visit: Payer: Self-pay | Admitting: Family

## 2021-09-20 ENCOUNTER — Telehealth: Payer: Self-pay | Admitting: *Deleted

## 2021-09-20 DIAGNOSIS — R109 Unspecified abdominal pain: Secondary | ICD-10-CM

## 2021-09-20 NOTE — Telephone Encounter (Signed)
Spoke with patient he stated that with the medication it went away a little bit but it is back.  He is having chest and back pain.  He is willing to do the U/S.  Blood pressure he states have been better.  He think his number for this morning was 148/90, he has not yet took his medication this morning.

## 2021-09-20 NOTE — Telephone Encounter (Signed)
-----   Message from Marrian Salvage, [place] sent at 09/19/2021  4:08 PM EST ----- Please call to check on him; labs and CXR were both normal; any improvement since changing to Protonix bid? If not, let's get an abdominal U/S. How is his blood pressure doing?

## 2021-09-27 ENCOUNTER — Other Ambulatory Visit: Payer: Self-pay

## 2021-09-27 ENCOUNTER — Ambulatory Visit (HOSPITAL_BASED_OUTPATIENT_CLINIC_OR_DEPARTMENT_OTHER)
Admission: RE | Admit: 2021-09-27 | Discharge: 2021-09-27 | Disposition: A | Discharge status: Home / Self Care | Payer: Medicare Other | Source: Ambulatory Visit | Attending: Family | Admitting: Family

## 2021-09-27 DIAGNOSIS — R109 Unspecified abdominal pain: Secondary | ICD-10-CM | POA: Insufficient documentation

## 2021-10-07 ENCOUNTER — Telehealth: Payer: Self-pay | Admitting: Internal Medicine

## 2021-10-07 NOTE — Telephone Encounter (Signed)
Attempted to return wife's call. Unable to leave voicemail.

## 2021-10-07 NOTE — Telephone Encounter (Signed)
Inbound call from patient's wife stating he is feeling worse and wanted to see if he can be seen sooner.  Informed next available with an APP would be 10/24/21.  Requested to speak with a nurse.  Please advise.

## 2021-10-18 ENCOUNTER — Other Ambulatory Visit (INDEPENDENT_AMBULATORY_CARE_PROVIDER_SITE_OTHER): Payer: No Typology Code available for payment source

## 2021-10-18 ENCOUNTER — Ambulatory Visit (INDEPENDENT_AMBULATORY_CARE_PROVIDER_SITE_OTHER): Payer: No Typology Code available for payment source | Admitting: Physician Assistant

## 2021-10-18 ENCOUNTER — Encounter: Payer: Self-pay | Admitting: Physician Assistant

## 2021-10-18 VITALS — BP 142/90 | HR 80 | Ht 72.0 in | Wt 269.0 lb

## 2021-10-18 DIAGNOSIS — R079 Chest pain, unspecified: Secondary | ICD-10-CM

## 2021-10-18 DIAGNOSIS — Z8719 Personal history of other diseases of the digestive system: Secondary | ICD-10-CM

## 2021-10-18 DIAGNOSIS — K219 Gastro-esophageal reflux disease without esophagitis: Secondary | ICD-10-CM

## 2021-10-18 LAB — COMPREHENSIVE METABOLIC PANEL
ALT: 30 U/L (ref 0–53)
AST: 23 U/L (ref 0–37)
Albumin: 4.7 g/dL (ref 3.5–5.2)
Alkaline Phosphatase: 72 U/L (ref 39–117)
BUN: 12 mg/dL (ref 6–23)
CO2: 27 mEq/L (ref 19–32)
Calcium: 9.9 mg/dL (ref 8.4–10.5)
Chloride: 105 mEq/L (ref 96–112)
Creatinine, Ser: 1.15 mg/dL (ref 0.40–1.50)
GFR: 72.42 mL/min (ref >60.00)
Glucose, Bld: 79 mg/dL (ref 70–99)
Potassium: 3.7 mEq/L (ref 3.5–5.1)
Sodium: 139 mEq/L (ref 135–145)
Total Bilirubin: 0.7 mg/dL (ref 0.2–1.2)
Total Protein: 8 g/dL (ref 6.0–8.3)

## 2021-10-18 LAB — CBC WITH DIFFERENTIAL/PLATELET
Basophils Absolute: 0 10*3/uL (ref 0.0–0.1)
Basophils Relative: 0.6 % (ref 0.0–3.0)
Eosinophils Absolute: 0.1 10*3/uL (ref 0.0–0.7)
Eosinophils Relative: 1.8 % (ref 0.0–5.0)
HCT: 46.1 % (ref 39.0–52.0)
Hemoglobin: 15.2 g/dL (ref 13.0–17.0)
Lymphocytes Relative: 40.6 % (ref 12.0–46.0)
Lymphs Abs: 1.7 10*3/uL (ref 0.7–4.0)
MCHC: 33.1 g/dL (ref 30.0–36.0)
MCV: 86 fl (ref 78.0–100.0)
Monocytes Absolute: 0.4 10*3/uL (ref 0.1–1.0)
Monocytes Relative: 10.5 % (ref 3.0–12.0)
Neutro Abs: 2 10*3/uL (ref 1.4–7.7)
Neutrophils Relative %: 46.5 % (ref 43.0–77.0)
Platelets: 204 10*3/uL (ref 150.0–400.0)
RBC: 5.36 Mil/uL (ref 4.22–5.81)
RDW: 14.4 % (ref 11.5–15.5)
WBC: 4.3 10*3/uL (ref 4.0–10.5)

## 2021-10-18 LAB — SEDIMENTATION RATE: Sed Rate: 24 mm/hr — ABNORMAL HIGH (ref 0–20)

## 2021-10-18 LAB — C-REACTIVE PROTEIN: CRP: <1 mg/dL (ref 0.5–20.0)

## 2021-10-18 MED ORDER — HYOSCYAMINE SULFATE 0.125 MG SL SUBL: 0.1250 mg | SUBLINGUAL_TABLET | Freq: Four times a day (QID) | SUBLINGUAL | 0 refills | Status: DC | PRN

## 2021-10-18 MED ORDER — PANTOPRAZOLE SODIUM 40 MG PO TBEC: 40.0000 mg | DELAYED_RELEASE_TABLET | Freq: Two times a day (BID) | ORAL | 3 refills | Status: DC

## 2021-10-18 MED ORDER — FAMOTIDINE 40 MG PO TABS: 40.0000 mg | ORAL_TABLET | Freq: Two times a day (BID) | ORAL | 3 refills | Status: DC

## 2021-10-18 NOTE — Progress Notes (Signed)
Subjective:    Patient ID: [name] Foster, male    DOB: [birth year], 56 y.o.   MRN: [id number]  HPI [name] Foster is a 56 year old African-American male, established with Dr. Hilarie Fredrickson.  He was last seen in February 2021.  He has history of GERD with atypical chest pain and esophageal hypersensitivity, with longstanding issues.  At that time he was being maintained on omeprazole in the morning and Pepcid in the evenings and was using hyoscyamine sublingual as needed. He comes in today with complaints of about a 6-week history of what he describes as constant right lower chest discomfort which radiates into his back.  He describes it as a pressure-like sensation or bubble type of sensation and says sometimes he will have increased discomfort with a deep breath.  He describes the pain as being throbbing at times and has been waking him up.  Interestingly he has no complaints of heartburn or indigestion, no dysphagia or odynophagia.  Appetite has been okay, no nausea or vomiting.  He does not feel that eating necessarily exacerbates the symptoms as this discomfort has just been fairly constant. He has been on pantoprazole 40 mg twice daily, and uses Pepcid on a as needed basis. Upper abdominal ultrasound was done by PCP in January 2023 showing hepatic steatosis and normal-appearing gallbladder and CBD. He has had prior cardiac work-up and was last seen by cardiologist in the spring 2022 Echo in September 2021 with EF of 60 to 65% Nuclear stress testing in May 2020 low risk study/normal no evidence for ischemia.  Patient does not complain of any dyspnea or dyspnea with exertion and does not feel that exertion changes this right lower chest pain.  No recent injuries  Last colonoscopy 2017 was negative Last EGD 2017 with grade a esophagitis and otherwise normal exam. Review of Systems Pertinent positive and negative review of systems were noted in the above HPI section.  All other review of systems was otherwise  negative.   Outpatient Encounter Medications as of 10/18/2021  Medication Sig   acetaminophen (TYLENOL) 500 MG tablet Take 1 tablet (500 mg total) by mouth every 6 (six) hours as needed.   albuterol (VENTOLIN HFA) 108 (90 Base) MCG/ACT inhaler Inhale 2 puffs into the lungs every 4 (four) hours as needed for wheezing or shortness of breath.   Alum & Mag Hydroxide-Simeth (MYLANTA PO) Take by mouth as needed.   amLODipine (NORVASC) 10 MG tablet Take 1 tab every evening.   ascorbic acid (VITAMIN C) 500 MG tablet Take 500 mg by mouth daily.   aspirin EC 81 MG tablet Take 81 mg by mouth daily. Swallow whole.   Bayer Microlet Lancets lancets Test blood sugars twice daily   Blood Glucose Monitoring Suppl (CONTOUR NEXT MONITOR) w/Device KIT 1 kit by Does not apply route 2 (two) times daily.   calcium carbonate (TUMS EX) 750 MG chewable tablet Chew 1 tablet by mouth daily as needed for heartburn.   carvedilol (COREG) 25 MG tablet Take 1 tablet (25 mg total) by mouth 2 (two) times daily with a meal.   Cholecalciferol (VITAMIN D) 50 MCG (2000 UT) CAPS Take 1 capsule (2,000 Units total) by mouth daily.   fluticasone (FLONASE) 50 MCG/ACT nasal spray Use 2 spray(s) in each nostril once daily   gabapentin (NEURONTIN) 100 MG capsule Take 1-2 capsules (100-200 mg total) by mouth at bedtime. (Patient taking differently: Take 100-200 mg by mouth at bedtime as needed.)   glucose blood (CONTOUR NEXT TEST) test  strip Test blood sugars twice daily   hyoscyamine (LEVSIN SL) 0.125 MG SL tablet Place 1 tablet (0.125 mg total) under the tongue every 6 (six) hours as needed (Esophageal Spasm).   ibuprofen (ADVIL) 800 MG tablet Take 1 tablet by mouth every 6 (six) hours as needed.   losartan (COZAAR) 100 MG tablet Take 1 tablet (100 mg total) by mouth daily.   Misc. Devices (PULSE OXIMETER FOR FINGER) MISC 1 Device by Does not apply route as needed (SOB, fatigue, headache patient with COVID).   Multiple Vitamins-Minerals  (MULTIVITAMIN WITH MINERALS) tablet Take 1 tablet by mouth daily.   pantoprazole (PROTONIX) 40 MG tablet Take 1 tablet (40 mg total) by mouth 2 (two) times daily before a meal.   rosuvastatin (CRESTOR) 40 MG tablet Take 1 tablet (40 mg total) by mouth daily.   tadalafil (CIALIS) 5 MG tablet Take 1 tablet (5 mg total) by mouth daily as needed for erectile dysfunction.   tamsulosin (FLOMAX) 0.4 MG CAPS capsule Take 1 capsule by mouth once daily   tirzepatide (MOUNJARO) 10 MG/0.5ML Pen Inject 10 mg into the skin once a week.   tirzepatide [facility]) 5 MG/0.5ML Pen Inject 5 mg into the skin once a week.   tirzepatide (MOUNJARO) 7.5 MG/0.5ML Pen Inject 7.5 mg into the skin once a week.   tiZANidine (ZANAFLEX) 2 MG tablet Take 0.5-2 tablets (1-4 mg total) by mouth at bedtime as needed for muscle spasms.   triamterene-hydrochlorothiazide (MAXZIDE-25) 37.5-25 MG tablet Take 1 tablet by mouth daily.   Vitamin D, Ergocalciferol, (DRISDOL) 1.25 MG (50000 UNIT) CAPS capsule Take one tablet by mouth weekly for 12 weeks   zinc gluconate 50 MG tablet Take 50 mg by mouth daily.   [DISCONTINUED] famotidine (PEPCID) 40 MG tablet Take 1 tablet (40 mg total) by mouth daily.   [DISCONTINUED] pantoprazole (PROTONIX) 40 MG tablet Take 1 tablet (40 mg total) by mouth 2 (two) times daily before a meal.   famotidine (PEPCID) 40 MG tablet Take 1 tablet (40 mg total) by mouth 2 (two) times daily.   metFORMIN (GLUCOPHAGE) 500 MG tablet TAKE 1 TABLET BY MOUTH TWICE DAILY WITH A MEAL (Patient not taking: Reported on 10/18/2021)   [DISCONTINUED] amitriptyline (ELAVIL) 25 MG tablet Take 1 tablet (25 mg total) by mouth at bedtime. (Patient not taking: No sig reported)   No facility-administered encounter medications on file as of 10/18/2021.   Allergies  Allergen Reactions   Hydrocodone Itching   Hydromorphone Itching   Tramadol Itching   Patient Active Problem List   Diagnosis Date Noted   Chronic bilateral low back pain  without sciatica 08/12/2020   Superior labrum anterior-to-posterior (SLAP) tear of right shoulder 04/27/2020   Chronic right shoulder pain 03/31/2020   Chronic left shoulder pain 03/31/2020   Urinary frequency 12/10/2019   Dysuria 12/08/2019   Pneumonia due to COVID-19 virus 07/23/2019   Bilateral inguinal hernia without obstruction or gangrene 99/37/1696   Umbilical hernia without obstruction and without gangrene 04/11/2017   Vitamin D deficiency 01/24/2017   Depression 01/24/2017   Shortness of breath on exertion 11/27/2016   Internal hemorrhoids    Atypical chest pain    Gastroesophageal reflux disease without esophagitis    Preventative health care 10/10/2015   Sleep apnea 10/10/2015   Urinary hesitancy 09/30/2015   Fatty liver 07/04/2015   Edema 05/07/2014   Scleroderma of esophagus ([employer]) 03/20/2014   Hiatal hernia with gastroesophageal reflux 03/20/2014   Diabetes mellitus type 2 in obese (  [place]) 07/13/2013   Anxiety state 07/13/2013   Erectile dysfunction 01/15/2013   Back pain 03/09/2012   Bilateral foot pain 04/25/2010   Obesity 01/05/2010   Hyperlipidemia 10/04/2009   Essential hypertension 10/04/2009   Social History   Socioeconomic History   Marital status: Married    Spouse name: [name]   Number of children: Not on file   Years of education: Not on file   Highest education level: Not on file  Occupational History   Occupation: Nature conservation officer: [employer]  Tobacco Use   Smoking status: Never   Smokeless tobacco: Never  Vaping Use   Vaping Use: Never used  Substance and Sexual Activity   Alcohol use: No   Drug use: No   Sexual activity: Yes    Partners: Female  Other Topics Concern   Not on file  Social History Narrative   Tobacco Use - No.    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[place])   grew up in [place] area   Married - 13 years   Alcohol Use - no   Regular Exercise - yes   Drug Use - no   3 girls    3 boys   Smoking  Status:      Packs/Day:     Caffeine use/day:  1 cup coffee every other day   Does Patient Exercise:  no   Social Determinants of Radio broadcast assistant Strain: Not on file  Food Insecurity: Not on file  Transportation Needs: Not on file  Physical Activity: Not on file  Stress: Not on file  Social Connections: Not on file  Intimate Partner Violence: Not on file    [name] Foster family history includes Alcoholism in his father; Cancer in his brother; Dementia in his mother; Depression in his mother; Diabetes in his mother, sister, and sister; Heart attack in his maternal grandmother; Heart disease in his mother; Heart failure in his mother; Hyperlipidemia in his mother; Hypertension in his mother and another family member; Obesity in his father and mother; Pancreatic disease in his sister; Stroke in his mother.      Objective:    Vitals:   10/18/21 1356  BP: (!) 142/90  Pulse: 80  SpO2: 97%    Physical Exam Well-developed well-nourished older African-American male in no acute distress.  Accompanied by his wife  Weight, 269 BMI 36.4  HEENT; nontraumatic normocephalic, EOMI, PE R LA, sclera anicteric. Oropharynx; not examined today Neck; supple, no JVD Cardiovascular; regular rate and rhythm with S1-S2, no murmur rub or gallop Pulmonary; there is some diminished breath sounds in the right base-no sternal or chest wall tenderness Abdomen; soft, obese, nontender, nondistended, no palpable mass or hepatosplenomegaly, bowel sounds are active, status postumbilical hernia repair Rectal; not examined today Skin; benign exam, no jaundice rash or appreciable lesions Extremities; no clubbing cyanosis or edema skin warm and dry Neuro/Psych; alert and oriented x4, grossly nonfocal mood and affect appropriate        Assessment & Plan:   #62 56 year old African-American male with 6-week history of right lower chest pain, under his breast with radiation to the back, increased with  inspiration at times, frequently waking him at night. No associated dyspnea or exertional symptoms. No abdominal pain, no nausea or vomiting, no complaints of dysphagia or odynophagia.  Etiology is not clear, symptoms are atypical for him for his GERD and atypical chest pain type symptoms. Rule out pleurisy, rule out mass lesion,  rule out PE  #2 chronic GERD with history of atypical chest pain and esophageal hypersensitivity-  #3 colon cancer screening-up-to-date last colonoscopy 2017 negative 4.  Hypertension 5.  Sleep apnea 6.  Hepatic steatosis 7.  Adult onset diabetes mellitus 8.  Status postumbilical hernia repair 9.  Anxiety/depression  Plan; continue Protonix 40 mg p.o. twice daily Have asked him to take Pepcid 20 mg p.o. twice daily in addition. We will refill hyoscyamine sublingual, I have asked him to try this to see if offers any relief, if helpful can use every 6 hours as needed CBC with differential, c-Met, sed rate, D-dimer We will schedule for CT of the chest-recent chest x-ray negative If all of the above is unremarkable and symptoms are persisting will consider repeat EGD with Dr. Hilarie Fredrickson.  Denetta Fei S Dallyn Bergland PA-C 10/18/2021   Cc: [name], MD

## 2021-10-18 NOTE — Telephone Encounter (Signed)
Pt scheduled to see Nicoletta Ba PA today at 2pm.

## 2021-10-18 NOTE — Telephone Encounter (Signed)
Inbound call from patient would like a sooner appt. States is feeling worse. Offered 2/16 with APP. Did not want the appt

## 2021-10-18 NOTE — Patient Instructions (Addendum)
If you are age 54 or younger, your body mass index should be between 19-25. Your Body mass index is 36.48 kg/m. If this is out of the aformentioned range listed, please consider follow up with your Primary Care Provider.  ________________________________________________________  The [place] [facility] providers would like to encourage you to use [facility] to communicate with providers for non-urgent requests or questions.  Due to long hold times on the telephone, sending your provider a message by [facility] may be a faster and more efficient way to get a response.  Please allow 48 business hours for a response.  Please remember that this is for non-urgent requests.  _______________________________________________________  Dennis Bast have been scheduled for a CT scan of the Chest at [facility], 1st floor Radiology. You are scheduled on 10/26/2021  at 8:00 am. You should arrive 15 minutes prior to your appointment time for registration.    Please follow the written instructions below on the day of your exam:   1) Do not eat anything after 4:00 am (4 hours prior to your test)   You may take any medications as prescribed with a small amount of water, if necessary. If you take any of the following medications: METFORMIN, GLUCOPHAGE, GLUCOVANCE, AVANDAMET, RIOMET, FORTAMET, [place] MET, JANUMET, GLUMETZA or METAGLIP, you MAY be asked to HOLD this medication 48 hours AFTER the exam.   The purpose of you drinking the oral contrast is to aid in the visualization of your intestinal tract. The contrast solution may cause some diarrhea. Depending on your individual set of symptoms, you may also receive an intravenous injection of x-ray contrast/dye. Plan on being at [facility] for 45 minutes or longer, depending on the type of exam you are having performed.   If you have any questions regarding your exam or if you need to reschedule, you may call [facility] at [phone] between the hours of 8:00 am and  5:00 pm, Monday-Friday.   Your provider has requested that you go to the basement level for lab work before leaving today. Press "B" on the elevator. The lab is located at the first door on the left as you exit the elevator.  Continue Pantoprazole 40 mg twice daily Increase Famotidine to 40 mg twice daily.  Try Hyoscyamine 0.125 mg every 6 hours as needed.  Follow up pending  Thank you for entrusting me with your care and choosing [facility].  Amy Esterwood, PA-C

## 2021-10-19 LAB — D-DIMER, QUANTITATIVE: D-Dimer, Quant: 0.42 mcg/mL FEU (ref <0.50)

## 2021-10-20 ENCOUNTER — Encounter: Payer: Self-pay | Admitting: Family Medicine

## 2021-10-20 ENCOUNTER — Ambulatory Visit (INDEPENDENT_AMBULATORY_CARE_PROVIDER_SITE_OTHER): Payer: No Typology Code available for payment source | Admitting: Family Medicine

## 2021-10-20 VITALS — BP 122/74 | HR 74 | Temp 98.2°F | Resp 16 | Wt 267.2 lb

## 2021-10-20 DIAGNOSIS — Z23 Encounter for immunization: Secondary | ICD-10-CM

## 2021-10-20 DIAGNOSIS — E559 Vitamin D deficiency, unspecified: Secondary | ICD-10-CM | POA: Diagnosis not present

## 2021-10-20 DIAGNOSIS — K449 Diaphragmatic hernia without obstruction or gangrene: Secondary | ICD-10-CM | POA: Diagnosis not present

## 2021-10-20 DIAGNOSIS — Z6831 Body mass index (BMI) 31.0-31.9, adult: Secondary | ICD-10-CM

## 2021-10-20 DIAGNOSIS — E785 Hyperlipidemia, unspecified: Secondary | ICD-10-CM

## 2021-10-20 DIAGNOSIS — E1169 Type 2 diabetes mellitus with other specified complication: Secondary | ICD-10-CM | POA: Diagnosis not present

## 2021-10-20 DIAGNOSIS — K219 Gastro-esophageal reflux disease without esophagitis: Secondary | ICD-10-CM

## 2021-10-20 DIAGNOSIS — E669 Obesity, unspecified: Secondary | ICD-10-CM

## 2021-10-20 DIAGNOSIS — I10 Essential (primary) hypertension: Secondary | ICD-10-CM | POA: Diagnosis not present

## 2021-10-20 DIAGNOSIS — E66811 Obesity, class 1: Secondary | ICD-10-CM

## 2021-10-20 MED ORDER — VITAMIN D (ERGOCALCIFEROL) 1.25 MG (50000 UNIT) PO CAPS: ORAL_CAPSULE | ORAL | 3 refills | Status: DC

## 2021-10-20 MED ORDER — TIRZEPATIDE 5 MG/0.5ML ~~LOC~~ SOAJ: 5.0000 mg | SUBCUTANEOUS | 0 refills | Status: DC

## 2021-10-20 NOTE — Patient Instructions (Addendum)
Try a meal plan like Hello Fresh etc Mylanta is ok One Hyoscyamine at a time Only two doses of PPI (ie Omeprazole and Pantoprazole)  Heartburn Heartburn is a type of pain or discomfort that can happen in the throat or chest. It is often described as a burning pain. It may also cause a bad, acid-like taste in the mouth. Heartburn may feel worse when you lie down or bend over, and it is often worse at night. Heartburn may be caused by stomach contents that move back up into the esophagus (reflux). Follow these instructions at home: Eating and drinking  Avoid certain foods and drinks as told by your health care provider. This may include: Coffee and tea, with or without caffeine. Drinks that contain alcohol. Energy drinks and sports drinks. Carbonated drinks or sodas. Chocolate and cocoa. Peppermint and mint flavorings. Garlic and onions. Horseradish. Spicy and acidic foods, including peppers, chili powder, curry powder, vinegar, hot sauces, and barbecue sauce. Citrus fruit juices and citrus fruits, such as oranges, lemons, and limes. Tomato-based foods, such as red sauce, chili, salsa, and pizza with red sauce. Fried and fatty foods, such as donuts, french fries, potato chips, and high-fat dressings. High-fat meats, such as hot dogs and fatty cuts of red and white meats, such as rib eye steak, sausage, ham, and bacon. High-fat dairy items, such as whole milk, butter, and cream cheese. Eat small, frequent meals instead of large meals. Avoid drinking large amounts of liquid with your meals. Avoid eating meals during the 2-3 hours before bedtime. Avoid lying down right after you eat. Do not exercise right after you eat. Lifestyle   If you are overweight, reduce your weight to an amount that is healthy for you. Ask your health care provider for guidance about a safe weight loss goal. Do not use any products that contain nicotine or tobacco. These products include cigarettes, chewing  tobacco, and vaping devices, such as e-cigarettes. These can make symptoms worse. If you need help quitting, ask your health care provider. Wear loose-fitting clothing. Do not wear anything tight around your waist that causes pressure on your abdomen. Raise (elevate) the head of your bed about 6 inches (15 cm) when you sleep. You can use a wedge to do this. Try to reduce your stress, such as with yoga or meditation. If you need help reducing stress, ask your health care provider. Medicines Take over-the-counter and prescription medicines only as told by your health care provider. Do not take aspirin or NSAIDs, such as ibuprofen, unless your health care provider told you to do so. Stop medicines only as told by your health care provider. If you stop taking some medicines too quickly, your symptoms may get worse. General instructions Pay attention to any changes in your symptoms. Keep all follow-up visits. This is important. Contact a health care provider if: You have new symptoms. You have unexplained weight loss. You have difficulty swallowing, or it hurts to swallow. You have wheezing or a persistent cough. Your symptoms do not improve with treatment. You have frequent heartburn for more than 2 weeks. Get help right away if: You suddenly have pain in your arms, neck, jaw, teeth, or back. You suddenly feel sweaty, dizzy, or light-headed. You have chest pain or shortness of breath. You vomit and your vomit looks like blood or coffee grounds. Your stool is bloody or black. These symptoms may represent a serious problem that is an emergency. Do not wait to see if the symptoms will go  away. Get medical help right away. Call your local emergency services (911 in the [place] Do not drive yourself to the hospital. Summary Heartburn is a type of pain or discomfort that can happen in the throat or chest. It is often described as a burning pain. It may also cause a bad, acid-like taste in the  mouth. Avoid certain foods and drinks as told by your health care provider. Take over-the-counter and prescription medicines only as told by your health care provider. Do not take aspirin or NSAIDs, such as ibuprofen, unless your health care provider told you to do so. Contact a health care provider if your symptoms do not improve or they get worse. This information is not intended to replace advice given to you by your health care provider. Make sure you discuss any questions you have with your health care provider. Document Revised: 03/03/2020 Document Reviewed: 03/03/2020 Elsevier Patient Education  [name] Foster.

## 2021-10-20 NOTE — Progress Notes (Signed)
Addendum: Reviewed and agree with assessment and management plan. Valin Massie M, MD  

## 2021-10-23 NOTE — Progress Notes (Signed)
Subjective:    Patient ID: [name] Foster, male    DOB: 06-12-1966, 56 y.o.   MRN: [id number]  Chief Complaint  Patient presents with   3 months follow up    HPI Patient is in today for follow pu on chronic medical concerns. No recent febrile illness or hospitalizations. His reflux has been much worse and symptomatic daily is causing some chest pain. He notes Tums help but then symptoms return. He has classic dyspepsia and burning substernal as well. Notes the Darcel Bayley has been helping with cravings. Denies palp/SOB/HA/congestion/fevers or GU c/o. Taking meds as prescribed   Past Medical History:  Diagnosis Date   Back pain    Bone spur    left foot   Chest pain    CHF (congestive heart failure) ([name])    Constipation    COVID-19    Diabetes mellitus type 2 in obese ([place]) 07/13/2013   Diverticulosis    Dyspnea    Erectile dysfunction 01/15/2013   Esophageal reflux 01/15/2013   Fatty liver 07/04/2015   GERD (gastroesophageal reflux disease)    Hiatal hernia    Hiatal hernia with gastroesophageal reflux 03/20/2014   Hyperglycemia 07/13/2013   Hyperplastic colon polyp    Hypertension    Internal hemorrhoids    Lower extremity edema    Mixed hyperlipidemia    Murmur    OSA (obstructive sleep apnea)    s/p UPPP   Plantar fasciitis    Pneumonia due to COVID-19 virus    Prediabetes    Reflux esophagitis    Shoulder pain    Stomach ulcer    from PCP   Urinary hesitancy 09/30/2015   Vitamin D deficiency     Past Surgical History:  Procedure Laterality Date   71 HOUR [place] STUDY N/A 02/28/2016   Procedure: 24 HOUR [place] STUDY;  Surgeon: Jerene Bears, MD;  Location: [facility];  Service: Gastroenterology;  Laterality: N/A;   ESOPHAGEAL MANOMETRY N/A 09/01/2013   Procedure: ESOPHAGEAL MANOMETRY (EM);  Surgeon: Sable Feil, MD;  Location: [facility];  Service: Endoscopy;  Laterality: N/A;   ESOPHAGEAL MANOMETRY N/A 02/28/2016   Procedure: ESOPHAGEAL MANOMETRY (EM);  Surgeon: Jerene Bears, MD;  Location: [facility];  Service: Gastroenterology;  Laterality: N/A;   FOOT TENDON TRANSFER Right    INGUINAL HERNIA REPAIR     TONSILLECTOMY     UMBILICAL HERNIA REPAIR     UVULECTOMY      Family History  Problem Relation Age of Onset   Dementia Mother    Diabetes Mother    Hypertension Mother    Heart failure Mother    Stroke Mother    Obesity Mother    Hyperlipidemia Mother    Heart disease Mother    Depression Mother    Hypertension Other        siblings   Diabetes Sister    Cancer Brother        lung cancer smoker   Heart attack Maternal Grandmother    Diabetes Sister    Pancreatic disease Sister    Obesity Father    Alcoholism Father    Colon cancer Neg Hx     Social History   Socioeconomic History   Marital status: Married    Spouse name: [name]   Number of children: Not on file   Years of education: Not on file   Highest education level: Not on file  Occupational History   Occupation: Scientist, forensic  Employer: [employer]  Tobacco Use   Smoking status: Never   Smokeless tobacco: Never  Vaping Use   Vaping Use: Never used  Substance and Sexual Activity   Alcohol use: No   Drug use: No   Sexual activity: Yes    Partners: Female  Other Topics Concern   Not on file  Social History Narrative   Tobacco Use - No.    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[place])   grew up in [place] area   Married - 13 years   Alcohol Use - no   Regular Exercise - yes   Drug Use - no   3 girls    3 boys   Smoking Status:      Packs/Day:     Caffeine use/day:  1 cup coffee every other day   Does Patient Exercise:  no   Social Determinants of Radio broadcast assistant Strain: Not on file  Food Insecurity: Not on file  Transportation Needs: Not on file  Physical Activity: Not on file  Stress: Not on file  Social Connections: Not on file  Intimate Partner Violence: Not on file    Outpatient Medications Prior to Visit   Medication Sig Dispense Refill   acetaminophen (TYLENOL) 500 MG tablet Take 1 tablet (500 mg total) by mouth every 6 (six) hours as needed. 60 tablet 0   albuterol (VENTOLIN HFA) 108 (90 Base) MCG/ACT inhaler Inhale 2 puffs into the lungs every 4 (four) hours as needed for wheezing or shortness of breath. 18 g 1   Alum & Mag Hydroxide-Simeth (MYLANTA PO) Take by mouth as needed.     amLODipine (NORVASC) 10 MG tablet Take 1 tab every evening. 90 tablet 1   ascorbic acid (VITAMIN C) 500 MG tablet Take 500 mg by mouth daily.     aspirin EC 81 MG tablet Take 81 mg by mouth daily. Swallow whole.     Bayer Microlet Lancets lancets Test blood sugars twice daily 100 each 0   Blood Glucose Monitoring Suppl (CONTOUR NEXT MONITOR) w/Device KIT 1 kit by Does not apply route 2 (two) times daily. 1 kit 0   calcium carbonate (TUMS EX) 750 MG chewable tablet Chew 1 tablet by mouth daily as needed for heartburn.     carvedilol (COREG) 25 MG tablet Take 1 tablet (25 mg total) by mouth 2 (two) times daily with a meal. 60 tablet 3   Cholecalciferol (VITAMIN D) 50 MCG (2000 UT) CAPS Take 1 capsule (2,000 Units total) by mouth daily. 90 capsule 0   famotidine (PEPCID) 40 MG tablet Take 1 tablet (40 mg total) by mouth 2 (two) times daily. 60 tablet 3   fluticasone (FLONASE) 50 MCG/ACT nasal spray Use 2 spray(s) in each nostril once daily 16 g 0   gabapentin (NEURONTIN) 100 MG capsule Take 1-2 capsules (100-200 mg total) by mouth at bedtime. (Patient taking differently: Take 100-200 mg by mouth at bedtime as needed.) 30 capsule 0   glucose blood (CONTOUR NEXT TEST) test strip Test blood sugars twice daily 100 each 0   hyoscyamine (LEVSIN SL) 0.125 MG SL tablet Place 1 tablet (0.125 mg total) under the tongue every 6 (six) hours as needed (Esophageal Spasm). 60 tablet 0   ibuprofen (ADVIL) 800 MG tablet Take 1 tablet by mouth every 6 (six) hours as needed.     losartan (COZAAR) 100 MG tablet Take 1 tablet (100 mg total)  by mouth daily. 90 tablet  0   metFORMIN (GLUCOPHAGE) 500 MG tablet TAKE 1 TABLET BY MOUTH TWICE DAILY WITH A MEAL 60 tablet 0   rosuvastatin (CRESTOR) 40 MG tablet Take 1 tablet (40 mg total) by mouth daily. 90 tablet 1   tadalafil (CIALIS) 5 MG tablet Take 1 tablet (5 mg total) by mouth daily as needed for erectile dysfunction. 30 tablet 0   tamsulosin (FLOMAX) 0.4 MG CAPS capsule Take 1 capsule by mouth once daily 30 capsule 0   tirzepatide (MOUNJARO) 10 MG/0.5ML Pen Inject 10 mg into the skin once a week. 6 mL 0   tirzepatide (MOUNJARO) 7.5 MG/0.5ML Pen Inject 7.5 mg into the skin once a week. 6 mL 0   tiZANidine (ZANAFLEX) 2 MG tablet Take 0.5-2 tablets (1-4 mg total) by mouth at bedtime as needed for muscle spasms. 20 tablet 1   triamterene-hydrochlorothiazide (MAXZIDE-25) 37.5-25 MG tablet Take 1 tablet by mouth daily. 90 tablet 1   zinc gluconate 50 MG tablet Take 50 mg by mouth daily.     tirzepatide [facility]) 5 MG/0.5ML Pen Inject 5 mg into the skin once a week. 6 mL 0   Vitamin D, Ergocalciferol, (DRISDOL) 1.25 MG (50000 UNIT) CAPS capsule Take one tablet by mouth weekly for 12 weeks 4 capsule 3   Misc. Devices (PULSE OXIMETER FOR FINGER) MISC 1 Device by Does not apply route as needed (SOB, fatigue, headache patient with COVID). 1 each 0   Multiple Vitamins-Minerals (MULTIVITAMIN WITH MINERALS) tablet Take 1 tablet by mouth daily. 100 tablet 5   pantoprazole (PROTONIX) 40 MG tablet Take 1 tablet (40 mg total) by mouth 2 (two) times daily before a meal. 60 tablet 3   No facility-administered medications prior to visit.    Allergies  Allergen Reactions   Hydrocodone Itching   Hydromorphone Itching   Tramadol Itching    Review of Systems  Constitutional:  Negative for fever and malaise/fatigue.  HENT:  Negative for congestion.   Eyes:  Negative for blurred vision.  Respiratory:  Negative for shortness of breath.   Cardiovascular:  Positive for chest pain. Negative for  palpitations and leg swelling.  Gastrointestinal:  Positive for heartburn. Negative for abdominal pain, blood in stool and nausea.  Genitourinary:  Negative for dysuria and frequency.  Musculoskeletal:  Negative for falls.  Skin:  Negative for rash.  Neurological:  Negative for dizziness, loss of consciousness and headaches.  Endo/Heme/Allergies:  Negative for environmental allergies.  Psychiatric/Behavioral:  Negative for depression. The patient is not nervous/anxious.       Objective:    Physical Exam Constitutional:      General: He is not in acute distress.    Appearance: Normal appearance. He is obese. He is not ill-appearing or toxic-appearing.  HENT:     Head: Normocephalic and atraumatic.     Right Ear: External ear normal.     Left Ear: External ear normal.     Nose: Nose normal.  Eyes:     General:        Right eye: No discharge.        Left eye: No discharge.     Pupils: Pupils are equal, round, and reactive to light.  Pulmonary:     Effort: Pulmonary effort is normal.  Abdominal:     Palpations: Abdomen is soft. There is no mass.     Tenderness: There is no guarding.  Musculoskeletal:        General: No tenderness.     Left lower leg:  No edema.  Skin:    Findings: No rash.  Neurological:     General: No focal deficit present.     Mental Status: He is alert and oriented to person, place, and time.  Psychiatric:        Behavior: Behavior normal.    BP 122/74    Pulse 74    Temp 98.2 F (36.8 C)    Resp 16    Wt 267 lb 3.2 oz (121.2 kg)    SpO2 95%    BMI 36.24 kg/m  Wt Readings from Last 3 Encounters:  10/20/21 267 lb 3.2 oz (121.2 kg)  10/18/21 269 lb (122 kg)  09/16/21 273 lb (123.8 kg)    Diabetic Foot Exam - Simple   No data filed    Lab Results  Component Value Date   WBC 4.3 10/18/2021   HGB 15.2 10/18/2021   HCT 46.1 10/18/2021   PLT 204.0 10/18/2021   GLUCOSE 79 10/18/2021   CHOL 203 (H) 07/19/2021   TRIG 144.0 07/19/2021   HDL 39.20  07/19/2021   LDLDIRECT 181.0 08/12/2020   LDLCALC 135 (H) 07/19/2021   ALT 30 10/18/2021   AST 23 10/18/2021   NA 139 10/18/2021   K 3.7 10/18/2021   CL 105 10/18/2021   CREATININE 1.15 10/18/2021   BUN 12 10/18/2021   CO2 27 10/18/2021   TSH 4.03 07/19/2021   PSA 3.64 12/08/2019   HGBA1C 7.4 (H) 07/19/2021   MICROALBUR 4.1 (H) 08/12/2020    Lab Results  Component Value Date   TSH 4.03 07/19/2021   Lab Results  Component Value Date   WBC 4.3 10/18/2021   HGB 15.2 10/18/2021   HCT 46.1 10/18/2021   MCV 86.0 10/18/2021   PLT 204.0 10/18/2021   Lab Results  Component Value Date   NA 139 10/18/2021   K 3.7 10/18/2021   CO2 27 10/18/2021   GLUCOSE 79 10/18/2021   BUN 12 10/18/2021   CREATININE 1.15 10/18/2021   BILITOT 0.7 10/18/2021   ALKPHOS 72 10/18/2021   AST 23 10/18/2021   ALT 30 10/18/2021   PROT 8.0 10/18/2021   ALBUMIN 4.7 10/18/2021   CALCIUM 9.9 10/18/2021   ANIONGAP 13 07/28/2019   GFR 72.42 10/18/2021   Lab Results  Component Value Date   CHOL 203 (H) 07/19/2021   Lab Results  Component Value Date   HDL 39.20 07/19/2021   Lab Results  Component Value Date   LDLCALC 135 (H) 07/19/2021   Lab Results  Component Value Date   TRIG 144.0 07/19/2021   Lab Results  Component Value Date   CHOLHDL 5 07/19/2021   Lab Results  Component Value Date   HGBA1C 7.4 (H) 07/19/2021       Assessment & Plan:   Problem List Items Addressed This Visit     Hyperlipidemia (Chronic)    Encourage heart healthy diet such as MIND or DASH diet, increase exercise, avoid trans fats, simple carbohydrates and processed foods, consider a krill or fish or flaxseed oil cap daily.       Essential hypertension (Chronic)    Well controlled, no changes to meds. Encouraged heart healthy diet such as the DASH diet and exercise as tolerated.       Obesity    Mounjaro has helped with cravings and weight loss but might be making reflux worse. Will drop back to 5 mg  weekly and monitor      Relevant Medications   tirzepatide [facility])  5 MG/0.5ML Pen   Diabetes mellitus type 2 in obese ([name])    hgba1c acceptable, minimize simple carbs. Increase exercise as tolerated. Continue current meds      Relevant Medications   tirzepatide [facility]) 5 MG/0.5ML Pen   Hiatal hernia with gastroesophageal reflux    Has been much worse recently consider the possibility that the [facility] is causing it to worsen. Encouraged to try and eat a heart healthy diet and drop the Mounjaro from 7.5 to 5 mg weekly instead of increasing to 10 mg. May consider dropping to 2.5 mg      Vitamin D deficiency    Supplement and monitor      Relevant Medications   Vitamin D, Ergocalciferol, (DRISDOL) 1.25 MG (50000 UNIT) CAPS capsule   Other Visit Diagnoses     Need for influenza vaccination    -  Primary   Relevant Orders   Flu Vaccine QUAD 36+ mos IM (Fluarix, Fluzone & Afluria Quad PF (Completed)       I am having Curtiss E. Rego maintain his acetaminophen, Contour Next Monitor, Pulse Oximeter For Finger, multivitamin with minerals, Bayer Microlet Lancets, Contour Next Test, zinc gluconate, ascorbic acid, Vitamin D, fluticasone, metFORMIN, tamsulosin, albuterol, carvedilol, aspirin EC, tadalafil, amLODipine, ibuprofen, triamterene-hydrochlorothiazide, rosuvastatin, tiZANidine, tirzepatide, tirzepatide, gabapentin, losartan, calcium carbonate, Alum & Mag Hydroxide-Simeth (MYLANTA PO), famotidine, hyoscyamine, pantoprazole, tirzepatide, and Vitamin D (Ergocalciferol).  Meds ordered this encounter  Medications   tirzepatide (MOUNJARO) 5 MG/0.5ML Pen    Sig: Inject 5 mg into the skin once a week.    Dispense:  6 mL    Refill:  0    Do not fill the 10 mg dose now. We are dropping back to 5 mg weekly for 2 months   Vitamin D, Ergocalciferol, (DRISDOL) 1.25 MG (50000 UNIT) CAPS capsule    Sig: Take one tablet by mouth weekly for 12 weeks    Dispense:  4 capsule    Refill:  3      Penni Homans, MD

## 2021-10-23 NOTE — Assessment & Plan Note (Signed)
Encourage heart healthy diet such as MIND or DASH diet, increase exercise, avoid trans fats, simple carbohydrates and processed foods, consider a krill or fish or flaxseed oil cap daily.  °

## 2021-10-23 NOTE — Assessment & Plan Note (Signed)
Supplement and monitor 

## 2021-10-23 NOTE — Assessment & Plan Note (Signed)
[name] Foster has helped with cravings and weight loss but might be making reflux worse. Will drop back to 5 mg weekly and monitor

## 2021-10-23 NOTE — Assessment & Plan Note (Signed)
Well controlled, no changes to meds. Encouraged heart healthy diet such as the DASH diet and exercise as tolerated.  °

## 2021-10-23 NOTE — Assessment & Plan Note (Signed)
hgba1c acceptable, minimize simple carbs. Increase exercise as tolerated. Continue current meds 

## 2021-10-23 NOTE — Assessment & Plan Note (Signed)
Has been much worse recently consider the possibility that the [facility] is causing it to worsen. Encouraged to try and eat a heart healthy diet and drop the Mounjaro from 7.5 to 5 mg weekly instead of increasing to 10 mg. May consider dropping to 2.5 mg

## 2021-10-26 ENCOUNTER — Other Ambulatory Visit: Payer: Self-pay

## 2021-10-26 ENCOUNTER — Ambulatory Visit (HOSPITAL_COMMUNITY)
Admission: RE | Admit: 2021-10-26 | Discharge: 2021-10-26 | Disposition: A | Discharge status: Home / Self Care | Payer: No Typology Code available for payment source | Source: Ambulatory Visit | Attending: Physician Assistant | Admitting: Physician Assistant

## 2021-10-26 DIAGNOSIS — Z8719 Personal history of other diseases of the digestive system: Secondary | ICD-10-CM | POA: Diagnosis present

## 2021-10-26 DIAGNOSIS — K219 Gastro-esophageal reflux disease without esophagitis: Secondary | ICD-10-CM | POA: Insufficient documentation

## 2021-10-26 DIAGNOSIS — R079 Chest pain, unspecified: Secondary | ICD-10-CM | POA: Diagnosis present

## 2021-10-26 MED ORDER — SODIUM CHLORIDE (PF) 0.9 % IJ SOLN
INTRAMUSCULAR | Status: AC
  Filled 2021-10-26: qty 50

## 2021-10-26 MED ORDER — IOHEXOL 350 MG/ML SOLN
100.0000 mL | Freq: Once | INTRAVENOUS | Status: AC | PRN
  Administered 2021-10-26: 100 mL via INTRAVENOUS

## 2021-10-27 ENCOUNTER — Telehealth: Payer: Self-pay | Admitting: Physician Assistant

## 2021-10-27 NOTE — Telephone Encounter (Signed)
Spoke with the patient. He is taking BID Protonix and Pepcid. He is taking Levsin SL every 6 hours PRN. Levsin gives him "about 2 hours of relief" and his pain returns. No new symptoms. Symptoms are unchanged except for the first 2 hours after Levsin.

## 2021-10-27 NOTE — Telephone Encounter (Signed)
Inbound call from patient states he is still experiencing chest pain

## 2021-10-28 ENCOUNTER — Other Ambulatory Visit: Payer: Self-pay

## 2021-10-28 MED ORDER — DILTIAZEM HCL 30 MG PO TABS: 30.0000 mg | ORAL_TABLET | Freq: Four times a day (QID) | ORAL | 0 refills | Status: DC

## 2021-10-28 NOTE — Telephone Encounter (Signed)
Patient is agreeable to this plan. Rx to the [place].

## 2021-10-28 NOTE — Telephone Encounter (Signed)
This is certainly not a new pain He does have history of GERD with mild esophagitis Agree with the new PPI Suspicion for esophageal spasm given improvement with hyoscyamine Before nitrate I would try diltiazem 30 mg 4 times daily; this can be consolidated into a longer acting formulation if it is tolerated and helping If we did an EGD, would consider Bravo on PPI to see if acid reflux is controlled Would recommend the diltiazem first and await response before EGD scheduled

## 2021-10-30 ENCOUNTER — Emergency Department (HOSPITAL_BASED_OUTPATIENT_CLINIC_OR_DEPARTMENT_OTHER)
Admission: EM | Admit: 2021-10-30 | Discharge: 2021-10-30 | Disposition: A | Discharge status: Home / Self Care | Payer: No Typology Code available for payment source | Attending: Emergency Medicine | Admitting: Emergency Medicine

## 2021-10-30 ENCOUNTER — Emergency Department (HOSPITAL_BASED_OUTPATIENT_CLINIC_OR_DEPARTMENT_OTHER): Payer: No Typology Code available for payment source

## 2021-10-30 ENCOUNTER — Encounter (HOSPITAL_BASED_OUTPATIENT_CLINIC_OR_DEPARTMENT_OTHER): Payer: Self-pay | Admitting: Emergency Medicine

## 2021-10-30 ENCOUNTER — Telehealth: Payer: Self-pay | Admitting: Physician Assistant

## 2021-10-30 ENCOUNTER — Other Ambulatory Visit: Payer: Self-pay

## 2021-10-30 DIAGNOSIS — K449 Diaphragmatic hernia without obstruction or gangrene: Secondary | ICD-10-CM | POA: Insufficient documentation

## 2021-10-30 DIAGNOSIS — R0789 Other chest pain: Secondary | ICD-10-CM | POA: Insufficient documentation

## 2021-10-30 DIAGNOSIS — R079 Chest pain, unspecified: Secondary | ICD-10-CM

## 2021-10-30 DIAGNOSIS — Z7984 Long term (current) use of oral hypoglycemic drugs: Secondary | ICD-10-CM | POA: Insufficient documentation

## 2021-10-30 DIAGNOSIS — Z7982 Long term (current) use of aspirin: Secondary | ICD-10-CM | POA: Insufficient documentation

## 2021-10-30 DIAGNOSIS — E119 Type 2 diabetes mellitus without complications: Secondary | ICD-10-CM | POA: Insufficient documentation

## 2021-10-30 DIAGNOSIS — I10 Essential (primary) hypertension: Secondary | ICD-10-CM | POA: Diagnosis not present

## 2021-10-30 DIAGNOSIS — Z79899 Other long term (current) drug therapy: Secondary | ICD-10-CM | POA: Diagnosis not present

## 2021-10-30 LAB — CBC WITH DIFFERENTIAL/PLATELET
Abs Immature Granulocytes: 0.02 10*3/uL (ref 0.00–0.07)
Basophils Absolute: 0 10*3/uL (ref 0.0–0.1)
Basophils Relative: 0 %
Eosinophils Absolute: 0.1 10*3/uL (ref 0.0–0.5)
Eosinophils Relative: 3 %
HCT: 43.4 % (ref 39.0–52.0)
Hemoglobin: 14.5 g/dL (ref 13.0–17.0)
Immature Granulocytes: 0 %
Lymphocytes Relative: 39 %
Lymphs Abs: 1.7 10*3/uL (ref 0.7–4.0)
MCH: 28.5 pg (ref 26.0–34.0)
MCHC: 33.4 g/dL (ref 30.0–36.0)
MCV: 85.3 fL (ref 80.0–100.0)
Monocytes Absolute: 0.4 10*3/uL (ref 0.1–1.0)
Monocytes Relative: 10 %
Neutro Abs: 2.2 10*3/uL (ref 1.7–7.7)
Neutrophils Relative %: 48 %
Platelets: 213 10*3/uL (ref 150–400)
RBC: 5.09 MIL/uL (ref 4.22–5.81)
RDW: 13.5 % (ref 11.5–15.5)
WBC: 4.5 10*3/uL (ref 4.0–10.5)
nRBC: 0 % (ref 0.0–0.2)

## 2021-10-30 LAB — COMPREHENSIVE METABOLIC PANEL
ALT: 31 U/L (ref 0–44)
AST: 21 U/L (ref 15–41)
Albumin: 3.9 g/dL (ref 3.5–5.0)
Alkaline Phosphatase: 64 U/L (ref 38–126)
Anion gap: 4 — ABNORMAL LOW (ref 5–15)
BUN: 11 mg/dL (ref 6–20)
CO2: 28 mmol/L (ref 22–32)
Calcium: 8.9 mg/dL (ref 8.9–10.3)
Chloride: 104 mmol/L (ref 98–111)
Creatinine, Ser: 1.16 mg/dL (ref 0.61–1.24)
GFR, Estimated: >60 mL/min (ref >60)
Glucose, Bld: 93 mg/dL (ref 70–99)
Potassium: 3.7 mmol/L (ref 3.5–5.1)
Sodium: 136 mmol/L (ref 135–145)
Total Bilirubin: 0.5 mg/dL (ref 0.3–1.2)
Total Protein: 7.2 g/dL (ref 6.5–8.1)

## 2021-10-30 LAB — LIPASE, BLOOD: Lipase: 37 U/L (ref 11–51)

## 2021-10-30 LAB — TROPONIN I (HIGH SENSITIVITY): Troponin I (High Sensitivity): 10 ng/L (ref <18)

## 2021-10-30 MED ORDER — LIDOCAINE VISCOUS HCL 2 % MT SOLN
15.0000 mL | Freq: Once | OROMUCOSAL | Status: AC
Start: 2021-10-30 — End: 2021-10-30
  Administered 2021-10-30: 15 mL via ORAL
  Filled 2021-10-30: qty 15

## 2021-10-30 MED ORDER — KETOROLAC TROMETHAMINE 15 MG/ML IJ SOLN
15.0000 mg | Freq: Once | INTRAMUSCULAR | Status: AC
  Administered 2021-10-30: 15 mg via INTRAVENOUS
  Filled 2021-10-30: qty 1

## 2021-10-30 MED ORDER — ALUM & MAG HYDROXIDE-SIMETH 200-200-20 MG/5ML PO SUSP
30.0000 mL | Freq: Once | ORAL | Status: AC
Start: 2021-10-30 — End: 2021-10-30
  Administered 2021-10-30: 30 mL via ORAL
  Filled 2021-10-30: qty 30

## 2021-10-30 MED ORDER — LIDOCAINE VISCOUS HCL 2 % MT SOLN: 15.0000 mL | OROMUCOSAL | 0 refills | Status: DC | PRN

## 2021-10-30 NOTE — ED Notes (Signed)
Attempted IV x1 to RFA, tol well, blood obtained for labs

## 2021-10-30 NOTE — ED Provider Notes (Signed)
[name] Foster   CSN: [id number] Arrival date & time: 10/30/21  1056     History  Chief Complaint  Patient presents with   Chest Pain    [name] Foster is a 56 y.o. male with past medical history significant for obesity, acid reflux with hiatal hernia, persistent atypical chest pain, high blood pressure, high cholesterol, diabetes who presents with ongoing central chest versus right chest burning chest pain that has been persistent for over a month.  Patient reports worse with deep breathing, worse with laying down.  Patient denies association with exertion, vomiting, diaphoresis, fever, chills.  Patient does endorse some nausea every once a while.  Patient reports that he has had negative stress test in the past, no cardiac catheterization.  He has not talked to his cardiologist recently but he does have one.  Patient reports that he just recently saw GI in regards to ongoing issues, they are trialing him on diltiazem, and vitamin D for possible esophageal spasm.  He reports that he has not had syncope, feeling of heart racing.  Patient denies anything in particular makes it better other than hyoscamine in the past.   Chest Pain Associated symptoms: nausea       Home Medications Prior to Admission medications   Medication Sig Start Date End Date Taking? Authorizing Provider  lidocaine (XYLOCAINE) 2 % solution Use as directed 15 mLs in the mouth or throat as needed for mouth pain. 10/30/21  Yes Roselene Gray H, PA-C  acetaminophen (TYLENOL) 500 MG tablet Take 1 tablet (500 mg total) by mouth every 6 (six) hours as needed. 05/24/16   [name], MD  albuterol (VENTOLIN HFA) 108 (90 Base) MCG/ACT inhaler Inhale 2 puffs into the lungs every 4 (four) hours as needed for wheezing or shortness of breath. 10/14/20   Laurin Coder, MD  Alum & Mag Hydroxide-Simeth (MYLANTA PO) Take by mouth as needed.    [provider]  amLODipine  (NORVASC) 10 MG tablet Take 1 tab every evening. 03/09/21   [name], MD  ascorbic acid (VITAMIN C) 500 MG tablet Take 500 mg by mouth daily.    [provider]  aspirin EC 81 MG tablet Take 81 mg by mouth daily. Swallow whole.    [provider]  Bayer Microlet Lancets lancets Test blood sugars twice daily 08/01/19   [name], MD  Blood Glucose Monitoring Suppl (CONTOUR NEXT MONITOR) w/Device KIT 1 kit by Does not apply route 2 (two) times daily. 03/15/17   Dennard Nip D, MD  calcium carbonate (TUMS EX) 750 MG chewable tablet Chew 1 tablet by mouth daily as needed for heartburn.    [provider]  carvedilol (COREG) 25 MG tablet Take 1 tablet (25 mg total) by mouth 2 (two) times daily with a meal. 11/12/20   Wendling, Crosby Oyster, DO  Cholecalciferol (VITAMIN D) 50 MCG (2000 UT) CAPS Take 1 capsule (2,000 Units total) by mouth daily. 08/12/20   [name], MD  diltiazem (CARDIZEM) 30 MG tablet Take 1 tablet (30 mg total) by mouth 4 (four) times daily. 10/28/21   Pyrtle, Lajuan Lines, MD  famotidine (PEPCID) 40 MG tablet Take 1 tablet (40 mg total) by mouth 2 (two) times daily. 10/18/21   Esterwood, Amy S, PA-C  fluticasone (FLONASE) 50 MCG/ACT nasal spray Use 2 spray(s) in each nostril once daily 08/19/20   [name], MD  gabapentin (NEURONTIN) 100 MG capsule  Take 1-2 capsules (100-200 mg total) by mouth at bedtime. Patient taking differently: Take 100-200 mg by mouth at bedtime as needed. 08/25/21   [name], MD  glucose blood (CONTOUR NEXT TEST) test strip Test blood sugars twice daily 08/01/19   [name], MD  hyoscyamine (LEVSIN SL) 0.125 MG SL tablet Place 1 tablet (0.125 mg total) under the tongue every 6 (six) hours as needed (Esophageal Spasm). 10/18/21   Esterwood, Amy S, PA-C  ibuprofen (ADVIL) 800 MG tablet Take 1 tablet by mouth every 6 (six) hours as needed. 05/25/21   [provider]  losartan (COZAAR) 100 MG tablet Take 1  tablet (100 mg total) by mouth daily. 09/16/21   Marrian Salvage, FNP  metFORMIN (GLUCOPHAGE) 500 MG tablet TAKE 1 TABLET BY MOUTH TWICE DAILY WITH A MEAL 08/19/20   [name], MD  Misc. Devices (PULSE OXIMETER FOR FINGER) MISC 1 Device by Does not apply route as needed (SOB, fatigue, headache patient with COVID). 08/01/19   [name], MD  Multiple Vitamins-Minerals (MULTIVITAMIN WITH MINERALS) tablet Take 1 tablet by mouth daily. 08/01/19   [name], MD  pantoprazole (PROTONIX) 40 MG tablet Take 1 tablet (40 mg total) by mouth 2 (two) times daily before a meal. 10/18/21   Esterwood, Amy S, PA-C  rosuvastatin (CRESTOR) 40 MG tablet Take 1 tablet (40 mg total) by mouth daily. 06/30/21   [name], MD  tadalafil (CIALIS) 5 MG tablet Take 1 tablet (5 mg total) by mouth daily as needed for erectile dysfunction. 03/08/21   [name], MD  tamsulosin (FLOMAX) 0.4 MG CAPS capsule Take 1 capsule by mouth once daily 08/19/20   [name], MD  tirzepatide [facility]) 10 MG/0.5ML Pen Inject 10 mg into the skin once a week. 08/25/21   [name], MD  tirzepatide [facility]) 5 MG/0.5ML Pen Inject 5 mg into the skin once a week. 10/20/21   [name], MD  tirzepatide [facility]) 7.5 MG/0.5ML Pen Inject 7.5 mg into the skin once a week. 08/25/21   [name], MD  tiZANidine (ZANAFLEX) 2 MG tablet Take 0.5-2 tablets (1-4 mg total) by mouth at bedtime as needed for muscle spasms. 07/19/21   [name], MD  triamterene-hydrochlorothiazide (MAXZIDE-25) 37.5-25 MG tablet Take 1 tablet by mouth daily. 06/30/21   [name], MD  Vitamin D, Ergocalciferol, (DRISDOL) 1.25 MG (50000 UNIT) CAPS capsule Take one tablet by mouth weekly for 12 weeks 10/20/21   [name], MD  zinc gluconate 50 MG tablet Take 50 mg by mouth daily.    [provider]  amitriptyline (ELAVIL) 25 MG tablet Take 1 tablet (25 mg total) by mouth at bedtime. Patient not taking: No sig  reported 02/13/19 07/18/19  Pyrtle, Lajuan Lines, MD      Allergies    Hydrocodone, Hydromorphone, and Tramadol    Review of Systems   Review of Systems  Cardiovascular:  Positive for chest pain.  Gastrointestinal:  Positive for nausea.  All other systems reviewed and are negative.  Physical Exam Updated Vital Signs BP 140/82    Pulse 65    Temp 98.1 F (36.7 C) (Oral)    Resp 14    Ht 6' (1.829 m)    Wt 122.5 kg    SpO2 99%    BMI 36.62 kg/m  Physical Exam Vitals and nursing Foster reviewed.  Constitutional:      General: He is  not in acute distress.    Appearance: Normal appearance.  HENT:     Head: Normocephalic and atraumatic.  Eyes:     General:        Right eye: No discharge.        Left eye: No discharge.  Cardiovascular:     Rate and Rhythm: Normal rate and regular rhythm.     Heart sounds: No murmur heard.   No friction rub. No gallop.  Pulmonary:     Effort: Pulmonary effort is normal.     Breath sounds: Normal breath sounds.  Chest:     Comments: No significant tenderness to palpation of the chest wall Abdominal:     General: Bowel sounds are normal.     Palpations: Abdomen is soft.  Skin:    General: Skin is warm and dry.     Capillary Refill: Capillary refill takes less than 2 seconds.  Neurological:     Mental Status: He is alert and oriented to person, place, and time.  Psychiatric:        Mood and Affect: Mood normal.        Behavior: Behavior normal.    ED Results / Procedures / Treatments   Labs (all labs ordered are listed, but only abnormal results are displayed) Labs Reviewed  COMPREHENSIVE METABOLIC PANEL - Abnormal; Notable for the following components:      Result Value   Anion gap 4 (*)    All other components within normal limits  CBC WITH DIFFERENTIAL/PLATELET  LIPASE, BLOOD  TROPONIN I (HIGH SENSITIVITY)    EKG EKG Interpretation  Date/Time:  Sunday October 30 2021 11:08:53 EST Ventricular Rate:  68 PR Interval:  230 QRS  Duration: 94 QT Interval:  395 QTC Calculation: 421 R Axis:   34 Text Interpretation: Sinus rhythm Prolonged PR interval No significant change since last tracing Confirmed by Davonna Belling [id number]) on 10/30/2021 11:10:45 AM  Radiology DG Chest 2 View  Result Date: 10/30/2021 CLINICAL DATA:  Chest pain EXAM: CHEST - 2 VIEW COMPARISON:  09/16/2021 FINDINGS: 1158 hours. The lungs are clear without focal pneumonia, edema, pneumothorax or pleural effusion. The cardiopericardial silhouette is within normal limits for size. The visualized bony structures of the thorax show no acute abnormality. Telemetry leads overlie the chest. IMPRESSION: No acute findings. Electronically Signed   By: Misty Stanley M.D.   On: 10/30/2021 12:07    Procedures Procedures    Medications Ordered in ED Medications  ketorolac (TORADOL) 15 MG/ML injection 15 mg (15 mg Intravenous Given 10/30/21 1225)  alum & mag hydroxide-simeth (MAALOX/MYLANTA) 200-200-20 MG/5ML suspension 30 mL (30 mLs Oral Given 10/30/21 1223)    And  lidocaine (XYLOCAINE) 2 % viscous mouth solution 15 mL (15 mLs Oral Given 10/30/21 1223)    ED Course/ Medical Decision Making/ A&P                           Medical Decision Making Amount and/or Complexity of Data Reviewed Labs: ordered. Radiology: ordered.  Risk OTC drugs. Prescription drug management.   I discussed this case with my attending physician who cosigned this Foster including patient's presenting symptoms, physical exam, and planned diagnostics and interventions. Attending physician stated agreement with plan or made changes to plan which were implemented.   Given the large differential diagnosis for [name] Foster, the decision making in this case is of high complexity.  After evaluating all of the data points in this  case, the presentation of [name] Foster is NOT consistent with Acute Coronary Syndrome (ACS) and/or myocardial ischemia, pulmonary embolism, aortic dissection;  Borhaave's, significant arrythmia, pneumothorax, cardiac tamponade, or other emergent cardiopulmonary condition.  Further, the presentation of [name] Foster is NOT consistent with pericarditis, myocarditis, cholecystitis, pancreatitis, mediastinitis, endocarditis, new valvular disease.  Additionally, the presentation of [name] Foster is NOT consistent with flail chest, cardiac contusion, ARDS, or significant intra-thoracic or intra-abdominal bleeding.  Moreover, this presentation is NOT consistent with pneumonia, sepsis, or pyelonephritis.  The patient has a presentation of burning/sharp central to right-sided chest pain is been ongoing for at least a month and a half persistently.  He has had previous negative stress test, he is currently talking the gastroenterology about acid reflux, esophagitis, recently trialed new esophageal spasm medication.  He has had relief with high-dose coming in the past.  Concern for esophageal spasm, gastric ulcer, Barrett's esophagitis.  No tenderness to palpation, or peritonitis, less clinical concern for acute mesenteric ischemia, or acute perforated ulcer at this time.  Patient's lab work is significant for unremarkable CBC, CMP, lipase, troponin.  He has negative troponin x1 in context of chest pain that has been ongoing for a month and a half with no change in quality.  I independently interpreted radiographic imaging of the chest which shows no acute intrathoracic abnormality.  My attending Dr. Alvino Chapel interpreted an EKG which shows an underlying rhythm of sinus rhythm.  I administered Toradol, Maalox/Mylanta, and viscous lidocaine for which patient endorsed improvement of symptoms although he does still have some persistent pain with deep breathing.  Discussed with patient this does add to my suspicion that this is related to his stomach.  Encouraged him to discontinue his ibuprofen, and use Tylenol for pain as well as continue take his proton pump inhibitors and  other GI medications.  Encourage follow-up with GI in the morning as planned.  Encourage follow-up with cardiology at his earliest convenience for further discussion, possible cardiac catheterization.  This time patient stable condition, no evidence of ACS or other acute cardiac or pulmonary abnormality.  Patient discharged in stable condition, return precautions given.  Strict return and follow-up precautions have been given by me personally or by detailed written instruction given verbally by nursing staff using the teach back method to the patient/family/caregiver(s).  Data Reviewed/Counseling: I have reviewed the patient's vital signs, nursing notes, and other relevant tests/information. I had a detailed discussion regarding the historical points, exam findings, and any diagnostic results supporting the discharge diagnosis. I also discussed the need for outpatient follow-up and the need to return to the ED if symptoms worsen or if there are any questions or concerns that arise at home.  Final Clinical Impression(s) / ED Diagnoses Final diagnoses:  Chest pain, unspecified type  Hiatal hernia    Rx / DC Orders ED Discharge Orders          Ordered    lidocaine (XYLOCAINE) 2 % solution  As needed        10/30/21 1331              Hayslee Casebolt, [place], PA-C 10/30/21 1337    Davonna Belling, MD 10/30/21 1530

## 2021-10-30 NOTE — Discharge Instructions (Addendum)
As we discussed I do not see any evidence of pneumonia, other lung disease, heart attack, or other heart conditions today.  Given the history that you describe I have suspicion that this may be related to your acid reflux, esophagitis, and hiatal hernia as we described.  I would touch base with your cardiologist to see if they would like to perform any further work-up, but otherwise I would follow-up as planned with your gastroenterologist in the morning.  In the meantime you can use the viscous lidocaine that I am prescribing, you can take this along with your home Maalox.  I would recommend that you discontinue ibuprofen, and begin using Tylenol up to 1000 mg every 6 hours, limit 4000 mg/day.  If your chest pain changes, including severe pressure, shortness of breath, feeling lightheaded, or passing out I recommend return to the emergency department for further evaluation.

## 2021-10-30 NOTE — ED Notes (Signed)
Pt discharged to home. Discharge instructions have been discussed with patient and/or family members. Pt verbally acknowledges understanding d/c instructions, and endorses comprehension to checkout at registration before leaving.  °

## 2021-10-30 NOTE — Telephone Encounter (Signed)
Paged by wife and patient for worsening chronic chest pain. Reviewed recent GI recommendations. Started on cardizem few days ago.   Currently reporting, sharp chest pain underneath R breast radiating to his back. Has some cough. Difficultly breathing breathing while laying down. Denies Le edema, palpitations, dizziness, syncope or melena. No sick contact.   During my evaluation BP was 152/95 HR 75.   Recommended ER visit with work up. Wife and patient agree. Sounds non cardiac chest pain.

## 2021-10-30 NOTE — ED Triage Notes (Signed)
Pt arrives pov, ambulatory to triage with c/o right side CP with radiation to back x 1 month. Denies shob. Pt endorses different treatments r/t pain with no improvement. Reports taking meds at 0700 today

## 2021-10-30 NOTE — ED Notes (Signed)
ED Provider at bedside. 

## 2021-10-30 NOTE — ED Notes (Signed)
Patient transported to X-ray 

## 2021-10-31 ENCOUNTER — Telehealth: Payer: Self-pay | Admitting: Internal Medicine

## 2021-10-31 NOTE — Telephone Encounter (Signed)
Pt scheduled to see Amy Esterwood PA 11/10/21 at 3pm.

## 2021-10-31 NOTE — Telephone Encounter (Signed)
Pt called back to add that he went to the ER yesterday and pt was prescribed Lidocaine with his Mylanta. It help to ease the pain a little bit but still is experiencing some pain but he wants to know what his next steps would be. Please advice. Thank you.

## 2021-10-31 NOTE — Telephone Encounter (Signed)
Patient called this morning stating he was told to call back today if the diltiazem prescribed for him last week was still not working.  Patient states he is still having pain and would like to know what the next step will be.  Please call patient and advise.  Thank you.

## 2021-10-31 NOTE — Telephone Encounter (Signed)
Please see notes below and advise 

## 2021-11-02 ENCOUNTER — Other Ambulatory Visit: Payer: Self-pay

## 2021-11-02 DIAGNOSIS — K219 Gastro-esophageal reflux disease without esophagitis: Secondary | ICD-10-CM

## 2021-11-02 DIAGNOSIS — Z8719 Personal history of other diseases of the digestive system: Secondary | ICD-10-CM

## 2021-11-02 NOTE — Telephone Encounter (Signed)
Spouse called to check on status of the message from Monday 10-31-21. [name] Foster is in a lot of pain and upset not to hear anything back for three days. I apologized and explained we were consulting with MD and would get back to him today before the end of the day.

## 2021-11-02 NOTE — Telephone Encounter (Signed)
If he is continuing to have issues I would say we repeat EGD This can be scheduled in [facility]

## 2021-11-02 NOTE — Telephone Encounter (Signed)
Pt scheduled for EGD in the [facility] 11/14/21@9 :30am. Pt to arrive there at 8:30am. Pt knows to be NPO after midnight. Pt aware of appt and knows he must have a driver for the appt.

## 2021-11-08 ENCOUNTER — Ambulatory Visit: Payer: Medicare Other | Admitting: Internal Medicine

## 2021-11-10 ENCOUNTER — Ambulatory Visit: Payer: No Typology Code available for payment source | Admitting: Physician Assistant

## 2021-11-13 ENCOUNTER — Encounter: Payer: Self-pay | Admitting: Certified Registered Nurse Anesthetist

## 2021-11-14 ENCOUNTER — Encounter: Payer: Self-pay | Admitting: Internal Medicine

## 2021-11-14 ENCOUNTER — Other Ambulatory Visit: Payer: Self-pay

## 2021-11-14 ENCOUNTER — Ambulatory Visit (AMBULATORY_SURGERY_CENTER): Payer: No Typology Code available for payment source | Admitting: Internal Medicine

## 2021-11-14 VITALS — BP 150/94 | HR 54 | Temp 99.5°F | Resp 20 | Ht 72.0 in | Wt 269.0 lb

## 2021-11-14 DIAGNOSIS — K219 Gastro-esophageal reflux disease without esophagitis: Secondary | ICD-10-CM | POA: Diagnosis present

## 2021-11-14 DIAGNOSIS — Z8719 Personal history of other diseases of the digestive system: Secondary | ICD-10-CM

## 2021-11-14 DIAGNOSIS — K2970 Gastritis, unspecified, without bleeding: Secondary | ICD-10-CM | POA: Diagnosis not present

## 2021-11-14 DIAGNOSIS — K2990 Gastroduodenitis, unspecified, without bleeding: Secondary | ICD-10-CM

## 2021-11-14 DIAGNOSIS — K2950 Unspecified chronic gastritis without bleeding: Secondary | ICD-10-CM

## 2021-11-14 DIAGNOSIS — R079 Chest pain, unspecified: Secondary | ICD-10-CM

## 2021-11-14 MED ORDER — ISOSORBIDE DINITRATE 5 MG PO TABS: 5.0000 mg | ORAL_TABLET | Freq: Three times a day (TID) | ORAL | 1 refills | Status: DC

## 2021-11-14 MED ORDER — SODIUM CHLORIDE 0.9 % IV SOLN: 500.0000 mL | Freq: Once | INTRAVENOUS | Status: DC

## 2021-11-14 MED ORDER — FAMOTIDINE 40 MG PO TABS: 40.0000 mg | ORAL_TABLET | Freq: Every day | ORAL | 3 refills | Status: DC

## 2021-11-14 NOTE — Patient Instructions (Signed)
Discharge instructions given. ?Handout on Hiatal hernia. ?Prescription sent to pharmacy. ?Resume previous medications. ?Patient instructed not to take isosorbide and [place] at same time.to stay off until speak to Doctor. ?YOU HAD AN ENDOSCOPIC PROCEDURE TODAY AT [facility]:   Refer to the procedure report that was given to you for any specific questions about what was found during the examination.  If the procedure report does not answer your questions, please call your gastroenterologist to clarify.  If you requested that your care partner not be given the details of your procedure findings, then the procedure report has been included in a sealed envelope for you to review at your convenience later. ? ?YOU SHOULD EXPECT: Some feelings of bloating in the abdomen. Passage of more gas than usual.  Walking can help get rid of the air that was put into your GI tract during the procedure and reduce the bloating. If you had a lower endoscopy (such as a colonoscopy or flexible sigmoidoscopy) you may notice spotting of blood in your stool or on the toilet paper. If you underwent a bowel prep for your procedure, you may not have a normal bowel movement for a few days. ? ?Please Note:  You might notice some irritation and congestion in your nose or some drainage.  This is from the oxygen used during your procedure.  There is no need for concern and it should clear up in a day or so. ? ?SYMPTOMS TO REPORT IMMEDIATELY: ? ? ?Following upper endoscopy (EGD) ? Vomiting of blood or coffee ground material ? New chest pain or pain under the shoulder blades ? Painful or persistently difficult swallowing ? New shortness of breath ? Fever of 100?F or higher ? Black, tarry-looking stools ? ?For urgent or emergent issues, a gastroenterologist can be reached at any hour by calling [phone]. ?Do not use MyChart messaging for urgent concerns.  ? ? ?DIET:  We do recommend a small meal at first, but then you may proceed  to your regular diet.  Drink plenty of fluids but you should avoid alcoholic beverages for 24 hours. ? ?ACTIVITY:  You should plan to take it easy for the rest of today and you should NOT DRIVE or use heavy machinery until tomorrow (because of the sedation medicines used during the test).   ? ?FOLLOW UP: ?Our staff will call the number listed on your records 48-72 hours following your procedure to check on you and address any questions or concerns that you may have regarding the information given to you following your procedure. If we do not reach you, we will leave a message.  We will attempt to reach you two times.  During this call, we will ask if you have developed any symptoms of COVID 19. If you develop any symptoms (ie: fever, flu-like symptoms, shortness of breath, cough etc.) before then, please call [phone].  If you test positive for Covid 19 in the 2 weeks post procedure, please call and report this information to [place].   ? ?If any biopsies were taken you will be contacted by phone or by letter within the next 1-3 weeks.  Please call us at [phone] if you have not heard about the biopsies in 3 weeks.  ? ? ?SIGNATURES/CONFIDENTIALITY: ?You and/or your care partner have signed paperwork which will be entered into your electronic medical record.  These signatures attest to the fact that that the information above on your After Visit Summary has been reviewed and is  understood.  Full responsibility of the confidentiality of this discharge information lies with you and/or your care-partner.  ? ?

## 2021-11-14 NOTE — Progress Notes (Signed)
BP  175/114, Labetalol given IV, MD update, vss  ?

## 2021-11-14 NOTE — Progress Notes (Signed)
Report given to PACU, vss 

## 2021-11-14 NOTE — Progress Notes (Signed)
1005 Robinul 0.1 mg IV given due large amount of secretions upon assessment.  MD made aware, vss 

## 2021-11-14 NOTE — Progress Notes (Signed)
Patient seen in the office less than 1 month ago by Nicoletta Ba, PA-C ?Please see that note for details ?Here today for EGD to evaluate atypical chest pain, right-sided and history of GERD with mild esophagitis ?CT chest with contrast negative ? ?No new symptoms since that office visit ?No chest pain with exertion or dyspnea ? ?He remains appropriate for EGD in the [facility] today ?

## 2021-11-14 NOTE — Op Note (Signed)
[facility] ?Patient Name: [name] Foster ?Procedure Date: 11/14/2021 9:54 AM ?MRN: [id number] ?Endoscopist: Jerene Bears , MD ?Age: 56 ?Referring MD:  ?Date of Birth: [birth year] ?Gender: Male ?Account #: [id number] ?Procedure:                Upper GI endoscopy ?Indications:              Gastro-esophageal reflux disease, Unexplained chest  ?                          pain with history of the same, pain improves with  ?                          GI cocktail, pain is rather constant though worse  ?                          with eating and at night. EGD (mild esophagitis),  ?                          manometry and pH with impedence for similar  ?                          symptoms in 2017 was unremarkable (previously  ?                          responsive to antispasmodics, did not tolerate low  ?                          dose amitriptyline in 2017/2018) ?Medicines:                Monitored Anesthesia Care ?Procedure:                Pre-Anesthesia Assessment: ?                          - Prior to the procedure, a History and Physical  ?                          was performed, and patient medications and  ?                          allergies were reviewed. The patient's tolerance of  ?                          previous anesthesia was also reviewed. The risks  ?                          and benefits of the procedure and the sedation  ?                          options and risks were discussed with the patient.  ?                          All questions were answered, and informed consent  ?  was obtained. Prior Anticoagulants: The patient has  ?                          taken no previous anticoagulant or antiplatelet  ?                          agents. ASA Grade Assessment: III - A patient with  ?                          severe systemic disease. After reviewing the risks  ?                          and benefits, the patient was deemed in  ?                          satisfactory condition to  undergo the procedure. ?                          After obtaining informed consent, the endoscope was  ?                          passed under direct vision. Throughout the  ?                          procedure, the patient's blood pressure, pulse, and  ?                          oxygen saturations were monitored continuously. The  ?                          Endoscope was introduced through the mouth, and  ?                          advanced to the second part of duodenum. The upper  ?                          GI endoscopy was accomplished without difficulty.  ?                          The patient tolerated the procedure well. ?Scope In: ?Scope Out: ?Findings:                 The examined esophagus was normal. No esophagitis  ?                          seen today. ?                          The gastroesophageal flap valve was visualized  ?                          endoscopically and classified as Hill Grade IV (no  ?                          fold, wide  open lumen, hiatal hernia present). ?                          A 1 cm hiatal hernia was present. ?                          Mild inflammation characterized by erythema was  ?                          found in the prepyloric region of the stomach.  ?                          Biopsies were taken with a cold forceps for  ?                          histology and Helicobacter pylori testing. ?                          The examined duodenum was normal. ?Complications:            No immediate complications. ?Estimated Blood Loss:     Estimated blood loss was minimal. ?Impression:               - Normal esophagus. ?                          - 1 cm hiatal hernia. ?                          - Gastritis. Biopsied. ?                          - Normal examined duodenum. ?Recommendation:           - Patient has a contact number available for  ?                          emergencies. The signs and symptoms of potential  ?                          delayed complications were discussed  with the  ?                          patient. Return to normal activities tomorrow.  ?                          Written discharge instructions were provided to the  ?                          patient. ?                          - Resume previous diet. ?                          - Continue present medications. Continue  ?  pantoprazole 40 mg twice daily 30 min before a  ?                          meal. Change famotidine to 40 mg once daily near  ?                          bedtime. Stop diltiazem due to nonresponse. Can  ?                          continue to use Levsin 0.125 mg SL 1-2 tablets  ?                          every 6 hours as needed for atypical chest pain.  ?                          Trial of isosorbide dinitrate 5 mg TID. Call me in  ?                          2 weeks to let me know if this new medication is  ?                          helping. ?                          - Await pathology results. ?Jerene Bears, MD ?11/14/2021 10:30:54 AM ?This report has been signed electronically. ?

## 2021-11-14 NOTE — Progress Notes (Signed)
Pt's states no medical or surgical changes since previsit or office visit.  ° °VS DT °

## 2021-11-14 NOTE — Progress Notes (Signed)
Called to room to assist during endoscopic procedure.  Patient ID and intended procedure confirmed with present staff. Received instructions for my participation in the procedure from the performing physician.  

## 2021-11-16 ENCOUNTER — Telehealth: Payer: Self-pay

## 2021-11-16 NOTE — Telephone Encounter (Signed)
?  Follow up Call- ? ?Call back number 11/14/2021  ?Post procedure Call Back phone  # [phone]  ?Permission to leave phone message Yes  ?Some recent data might be hidden  ?  ? ?Patient questions: ? ?Do you have a fever, pain , or abdominal swelling? No. ?Pain Score  0 * ? ?Have you tolerated food without any problems? Yes.   ? ?Have you been able to return to your normal activities? Yes.   ? ?Do you have any questions about your discharge instructions: ?Diet   No. ?Medications  No. ?Follow up visit  No. ? ?Do you have questions or concerns about your Care? No. ? ?Actions: ?* If pain score is 4 or above: ?No action needed, pain <4. ? ?Have you developed a fever since your procedure? no ? ?2.   Have you had an respiratory symptoms (SOB or cough) since your procedure? no ? ?3.   Have you tested positive for COVID 19 since your procedure no ? ?4.   Have you had any family members/close contacts diagnosed with the COVID 19 since your procedure?  no ? ? ?If yes to any of these questions please route to Joylene John, RN and Joella Prince, RN ? ? ? ?

## 2021-11-17 ENCOUNTER — Encounter: Payer: Self-pay | Admitting: Internal Medicine

## 2021-11-17 ENCOUNTER — Encounter (HOSPITAL_COMMUNITY): Payer: Self-pay | Admitting: Internal Medicine

## 2021-11-17 NOTE — Telephone Encounter (Signed)
Called pt and was told he's been having chest pain x40month was seen by GI and was prescribed medication for angina. Pt is scheduled to see Dr.Bensimhon in May we moved appt up to March 30th.  ?

## 2021-11-22 ENCOUNTER — Other Ambulatory Visit: Payer: Self-pay | Admitting: Family Medicine

## 2021-11-22 ENCOUNTER — Encounter: Payer: Self-pay | Admitting: Internal Medicine

## 2021-11-25 ENCOUNTER — Ambulatory Visit (INDEPENDENT_AMBULATORY_CARE_PROVIDER_SITE_OTHER): Payer: No Typology Code available for payment source | Admitting: Internal Medicine

## 2021-11-25 ENCOUNTER — Encounter: Payer: Self-pay | Admitting: Internal Medicine

## 2021-11-25 ENCOUNTER — Telehealth: Payer: Self-pay | Admitting: Internal Medicine

## 2021-11-25 ENCOUNTER — Other Ambulatory Visit: Payer: Self-pay

## 2021-11-25 VITALS — BP 132/84 | HR 79 | Temp 98.4°F | Ht 72.0 in | Wt 277.2 lb

## 2021-11-25 DIAGNOSIS — R0789 Other chest pain: Secondary | ICD-10-CM | POA: Diagnosis not present

## 2021-11-25 LAB — TROPONIN I (HIGH SENSITIVITY): High Sens Troponin I: 9 ng/L (ref 2–17)

## 2021-11-25 LAB — D-DIMER, QUANTITATIVE: D-Dimer, Quant: 0.31 mcg/mL FEU (ref <0.50)

## 2021-11-25 LAB — SEDIMENTATION RATE: Sed Rate: 32 mm/hr — ABNORMAL HIGH (ref 0–20)

## 2021-11-25 NOTE — Telephone Encounter (Signed)
Called and spoke with pt letting him know the results of bloodwork and he verbalized understanding. Nothing further needed. 

## 2021-11-25 NOTE — Patient Instructions (Addendum)
ICD-10-CM   ?1. Atypical chest pain  R07.89   ?  ? ? ?Chek troponin, d-dimer, ANA, DS-DNA, RF, CCP, ESR, ? - stat  ?- If abnormal will guide you accordingly ?Get HRCT supine and prone next 1-2 weeks ? ?Followup ? - Dr Carmelina Dane or APP to discuss results next 1-2 weeks ? - can be video visit ? ?

## 2021-11-25 NOTE — Progress Notes (Signed)
? ? ?    ?[name] Foster    [id number]    1965/11/13 ? ?Primary Care Physician:[name], [name], MD ? ?Referring Physician: [name], MD ?[facility] ?[address] ?[place],  [place] [address] ? ?Chief complaint:   ?Patient is being seen for shortness of breath on exertion ?Shortness of breath is not better since his last visit ? ?HPI: ? ?Had a recent PFT showing moderate restrictive disease, no significant obstruction, no significant bronchodilator response ?Did have Covid November December 2020 and felt it never really got back to his usual ? ?Weight has been stable recently but prior to recently did gain about 30 to 40 pounds ? ?History of hypertension, diastolic dysfunction-recent echo is stable ? ?Continues to have significant shortness of breath on exertion ? ?He has a history of obstructive sleep apnea, has not been using CPAP recently  ?-He still has the machine that is with him to go back to using CPAP ? ?Did have Covid November/December last year for which she was hospitalized for about 5 days ? ?He gets short of breath with moderate exertion and this happens regularly ? ?He does have occasional chest discomfort which he stated started around the time when he had Covid and just never really went away ? ?Never smoker ? ?OV 11/25/2021 ? ?Subjective:  ?Patient ID: [name] Foster, male , DOB: 1966-09-03 , age 56 y.o. , MRN: [id number] , ADDRESS: [address] ?[place] [address] ?PCP [name], MD ?Patient Care Team: ?[name], MD as PCP - General (Family Medicine) ?Sable Feil, MD as Referring Physician (Gastroenterology) ?Hilts, Legrand Como, MD (Family Medicine) ?Wallene Huh, DPM as Consulting Physician (Podiatry) ? ?This Provider for this visit: Treatment Team:  ?Attending Provider: Brand Males, MD ? ? ? ?11/25/2021 -   ?Chief Complaint  ?Patient presents with  ? Acute Visit  ?  Pt states his breathing has been a little worse for about the past 2 months and states when he  takes a deep breath, he will have a pain in the center of his chest which will radiate to his back. Pt states this has become worse recently. Also states that he will have a mild cough occasionally.  ? ?Patien f Dr. Jenetta Downer here for an acute visit for atypical chest pain. ? ?HPI ?[name] Foster 56 y.o. -I reviewed the medical records indicate that he has sleep apnea.  He also had some atypical chest pain a year ago in the post-COVID setting. [name]  He tells me that he has had this esophageal pain on and off for 5 years.  He has seen Dr. Hilarie Fredrickson and GI.  Review of the records indicate they have given a diagnosis of chronic acid reflux disease with history of atypical chest pain and esophageal hypersensitivity.  He has been placed on Protonix and Pepcid.  In February 2023 even got a CT angiogram chest.  And this was negative.  Pulmonary fields were clear. ?However he tells me now that for the last 6 weeks he said a different type of pain.  He points to his xiphoid area and right inframammary area in the lower chest and says there is a pain that is constant.  It is present day and night.  He says at night when it wakes him up.  It also radiates to the back.  When he takes a deep breath there is a bubble sensation or a pressure sensation in that area.  When I pressed on it it was slightly tender but no reproducible tenderness.  The pain is made worse by eating or lying down.  It is relieved by Tums.  A week ago he had an endoscopy shows small hiatal hernia and mild inflammation.  He was given Isordil from 10 days ago according to his history and review of the records but this has not helped.  It appears he might of been referred to cardiology.  He is here complaining of the same symptoms.  Also after the endoscopies have some dry cough.  But he categorically denies any wheezing or shortness of breath.  No fever or chills.  No nausea vomiting diarrhea. ? ? ?Endoscopy 11/14/2021 ? ?The examined esophagus was normal. No esophagitis seen  today. ?- The gastroesophageal flap valve was visualized endoscopically and classified as Hill Grade IV ?(no fold, wide open lumen, hiatal hernia present). ?- A 1 cm hiatal hernia was present. ?- Mild inflammation characterized by erythema was found in the prepyloric region of the ?stomach. Biopsies were taken with a cold forceps for histology and Helicobacter pylori ?testing. ?- The examined duodenum was normal. ? ?CT anguo feb 2023 [place] is not a CT high-resolution] ? ?Narrative & Impression  ?CLINICAL DATA:  Mid anterior chest pain rating to the back. ?  ?EXAM: ?CT ANGIOGRAPHY CHEST WITH CONTRAST ?  ?TECHNIQUE: ?Multidetector CT imaging of the chest was performed using the ?standard protocol during bolus administration of intravenous ?contrast. Multiplanar CT image reconstructions and MIPs were ?obtained to evaluate the vascular anatomy. ?  ?RADIATION DOSE REDUCTION: This exam was performed according to the ?departmental dose-optimization program which includes automated ?exposure control, adjustment of the mA and/or kV according to ?patient size and/or use of iterative reconstruction technique. ?  ?CONTRAST:  165m OMNIPAQUE IOHEXOL 350 MG/ML SOLN ?  ?COMPARISON:  Cardiac CT 01/18/2021. ?  ?FINDINGS: ?Cardiovascular: Image quality in the lower lung zones is compromised ?by respiratory motion. Negative for pulmonary embolus. Heart is ?enlarged. No pericardial effusion. ?  ?Mediastinum/Nodes: No pathologically enlarged mediastinal, hilar or ?axillary lymph nodes. Esophagus is grossly unremarkable. ?  ?Lungs/Pleura: Image quality is degraded by expiratory phase imaging ?and respiratory motion. Minimal subsegmental volume loss in the ?lingula and lower lobes. 4 mm lingular nodule (7/62), unchanged and ?likely benign. No pleural fluid. Airway is unremarkable. ?  ?Upper Abdomen: Liver is slightly decreased in attenuation diffusely. ?There may be a subcentimeter low-attenuation lesion in the inferior ?right hepatic  lobe, too small to characterize. Visualized portions ?of the liver, gallbladder, adrenal glands, kidneys, spleen, ?pancreas, stomach and bowel are otherwise unremarkable with the ?exception of a tiny hiatal hernia. ?  ?Musculoskeletal: Degenerative changes in the spine. No worrisome ?lytic or sclerotic lesions. ?  ?Review of the MIP images confirms the above findings. ?  ?IMPRESSION: ?1. Negative for pulmonary embolus. No findings to explain the ?patient's pain. ?2. Liver appears steatotic. ?  ?  ?Electronically Signed ?  By: MLorin PicketM.D. ?  On: 10/26/2021 09:37 ?   ? ? ?PFT ? ?PFT Results Latest Ref Rng & Units 09/28/2020  ?FVC-Pre L 3.55  ?FVC-Predicted Pre % 79  ?FVC-Post L 3.41  ?FVC-Predicted Post % 76  ?Pre FEV1/FVC % % 86  ?Post FEV1/FCV % % 89  ?FEV1-Pre L 3.05  ?FEV1-Predicted Pre % 85  ?FEV1-Post L 3.04  ?DLCO uncorrected ml/min/mmHg 28.74  ?DLCO UNC% % 93  ?DLCO corrected ml/min/mmHg 28.74  ?DLCO COR %Predicted % 93  ?DLVA Predicted % 132  ?  TLC L 4.94  ?TLC % Predicted % 66  ?RV % Predicted % 61  ? ? ? ? ? has a past medical history of Back pain, Bone spur, Chest pain, CHF (congestive heart failure) ([place]), Constipation, COVID-19, Diabetes mellitus type 2 in obese ([place]) (07/13/2013), Diverticulosis, Dyspnea, Erectile dysfunction (01/15/2013), Esophageal reflux (01/15/2013), Fatty liver (07/04/2015), GERD (gastroesophageal reflux disease), Hiatal hernia, Hiatal hernia with gastroesophageal reflux (03/20/2014), Hyperglycemia (07/13/2013), Hyperplastic colon polyp, Hypertension, Internal hemorrhoids, Lower extremity edema, Mixed hyperlipidemia, Murmur, OSA (obstructive sleep apnea), Plantar fasciitis, Pneumonia due to COVID-19 virus, Prediabetes, Reflux esophagitis, Shoulder pain, Stomach ulcer, Urinary hesitancy (09/30/2015), and Vitamin D deficiency. ? ? reports that he has never smoked. He has never been exposed to tobacco smoke. He has never used smokeless tobacco. ? ?Past Surgical History:  ?Procedure  Laterality Date  ? 73 HOUR [place] STUDY N/A 02/28/2016  ? Procedure: [place] STUDY;  Surgeon: Jerene Bears, MD;  Location: [facility];  Service: Gastroenterology;  Laterality: N/A;  ? ESOPHAGEAL MANOMETRY N/

## 2021-11-25 NOTE — Telephone Encounter (Signed)
Trop and d-dimer normal. Await rest of labs ?

## 2021-11-27 NOTE — Progress Notes (Signed)
Blood work normal. He should have his HRCT and follow with Dr Jenetta Downer

## 2021-11-28 LAB — ANTI-DNA ANTIBODY, DOUBLE-STRANDED: ds DNA Ab: <1 IU/mL

## 2021-11-28 LAB — CYCLIC CITRUL PEPTIDE ANTIBODY, IGG: Cyclic Citrullin Peptide Ab: <16 UNITS

## 2021-11-28 LAB — RHEUMATOID FACTOR: Rheumatoid fact SerPl-aCnc: <14 IU/mL (ref <14)

## 2021-11-28 LAB — ANA: Anti Nuclear Antibody (ANA): NEGATIVE

## 2021-11-29 ENCOUNTER — Telehealth (HOSPITAL_BASED_OUTPATIENT_CLINIC_OR_DEPARTMENT_OTHER): Payer: Self-pay

## 2021-12-05 ENCOUNTER — Encounter: Payer: Self-pay | Admitting: Internal Medicine

## 2021-12-05 NOTE — Telephone Encounter (Signed)
Patient would like his CT done at [facility].  ? ?PCCs please advise if this is okay, if so does the order need to be changed?  ? ?Thanks!  ?

## 2021-12-06 LAB — HM DIABETES EYE EXAM

## 2021-12-08 ENCOUNTER — Ambulatory Visit (HOSPITAL_COMMUNITY)
Admission: RE | Admit: 2021-12-08 | Discharge: 2021-12-08 | Disposition: A | Discharge status: Home / Self Care | Payer: No Typology Code available for payment source | Source: Ambulatory Visit | Attending: Internal Medicine | Admitting: Internal Medicine

## 2021-12-08 ENCOUNTER — Encounter (HOSPITAL_COMMUNITY): Payer: Self-pay | Admitting: Internal Medicine

## 2021-12-08 ENCOUNTER — Encounter: Payer: Self-pay | Admitting: Internal Medicine

## 2021-12-08 VITALS — BP 110/70 | HR 76 | Wt 276.2 lb

## 2021-12-08 DIAGNOSIS — G4733 Obstructive sleep apnea (adult) (pediatric): Secondary | ICD-10-CM | POA: Insufficient documentation

## 2021-12-08 DIAGNOSIS — R079 Chest pain, unspecified: Secondary | ICD-10-CM | POA: Diagnosis present

## 2021-12-08 DIAGNOSIS — I509 Heart failure, unspecified: Secondary | ICD-10-CM | POA: Insufficient documentation

## 2021-12-08 DIAGNOSIS — Z8249 Family history of ischemic heart disease and other diseases of the circulatory system: Secondary | ICD-10-CM | POA: Insufficient documentation

## 2021-12-08 DIAGNOSIS — S8990XD Unspecified injury of unspecified lower leg, subsequent encounter: Secondary | ICD-10-CM | POA: Insufficient documentation

## 2021-12-08 DIAGNOSIS — K220 Achalasia of cardia: Secondary | ICD-10-CM | POA: Insufficient documentation

## 2021-12-08 DIAGNOSIS — Z79899 Other long term (current) drug therapy: Secondary | ICD-10-CM | POA: Insufficient documentation

## 2021-12-08 DIAGNOSIS — Z8349 Family history of other endocrine, nutritional and metabolic diseases: Secondary | ICD-10-CM | POA: Insufficient documentation

## 2021-12-08 DIAGNOSIS — I10 Essential (primary) hypertension: Secondary | ICD-10-CM | POA: Diagnosis not present

## 2021-12-08 DIAGNOSIS — K219 Gastro-esophageal reflux disease without esophagitis: Secondary | ICD-10-CM | POA: Diagnosis not present

## 2021-12-08 DIAGNOSIS — I440 Atrioventricular block, first degree: Secondary | ICD-10-CM | POA: Diagnosis not present

## 2021-12-08 DIAGNOSIS — Z8616 Personal history of COVID-19: Secondary | ICD-10-CM | POA: Insufficient documentation

## 2021-12-08 DIAGNOSIS — Z7982 Long term (current) use of aspirin: Secondary | ICD-10-CM | POA: Insufficient documentation

## 2021-12-08 DIAGNOSIS — K449 Diaphragmatic hernia without obstruction or gangrene: Secondary | ICD-10-CM | POA: Insufficient documentation

## 2021-12-08 DIAGNOSIS — Z8719 Personal history of other diseases of the digestive system: Secondary | ICD-10-CM | POA: Diagnosis not present

## 2021-12-08 DIAGNOSIS — K760 Fatty (change of) liver, not elsewhere classified: Secondary | ICD-10-CM | POA: Diagnosis not present

## 2021-12-08 DIAGNOSIS — E119 Type 2 diabetes mellitus without complications: Secondary | ICD-10-CM | POA: Insufficient documentation

## 2021-12-08 DIAGNOSIS — K2289 Other specified disease of esophagus: Secondary | ICD-10-CM | POA: Diagnosis not present

## 2021-12-08 DIAGNOSIS — I110 Hypertensive heart disease with heart failure: Secondary | ICD-10-CM | POA: Insufficient documentation

## 2021-12-08 DIAGNOSIS — E669 Obesity, unspecified: Secondary | ICD-10-CM | POA: Diagnosis not present

## 2021-12-08 DIAGNOSIS — X58XXXD Exposure to other specified factors, subsequent encounter: Secondary | ICD-10-CM | POA: Diagnosis not present

## 2021-12-08 DIAGNOSIS — R9431 Abnormal electrocardiogram [ECG] [EKG]: Secondary | ICD-10-CM | POA: Insufficient documentation

## 2021-12-08 DIAGNOSIS — E782 Mixed hyperlipidemia: Secondary | ICD-10-CM | POA: Diagnosis not present

## 2021-12-08 DIAGNOSIS — Z833 Family history of diabetes mellitus: Secondary | ICD-10-CM | POA: Insufficient documentation

## 2021-12-08 NOTE — Progress Notes (Signed)
? ?CARDIOLOGY NEW PATIENT [facility] NOTE ? ?Primary Care: [name], MD ? ? ?HPI: ? ?[name] Foster is 56 y/o male (husband of [place] Roye) with DM2, HTN, HL, OSA who presents today to re-establish cardiac care.  ? ?We saw him in 2017 for CP.  He has been found to have GERD with hypomotile esophagus and lower esophageal sphincter incompetence.   ?  ?2011 Echo EF 49% grade 2 diastolic dysfunction. Also had normal ETT.  ?2013 & 2018 Cardiac CT. Normal cors. Ca++ score = 0.  ?2016 Echo LVEF 55-60%, Grade 1 DD ?  ?Had EGD and RUQ u/s in 12/13. Both normal except for fatty liver.  ? ?Had COVID 2/22: Chest CT There is mild, bandlike post infectious or inflammatory scarring of the bilateral lung bases. No evidence of fibrotic interstitial lung disease. ? ?Coronary calcium scoring 5/22 CAC = 0 ? ?CT chest in 2/23: No PE ? ?Has been on disability for a leg injury. Not working currently. Still with frequent CP. Says it is constant. Worse after eating. Has had GI w/u a few weeks ago notable only for hiatal hernia. No benefit from Imdur. Able to do yardwork without problem. Occasional LE edema. Seen in ED and had relief with GI cocktail  ? ? ? ?Past Medical History:  ?Diagnosis Date  ? Back pain   ? Bone spur   ? left foot  ? Chest pain   ? CHF (congestive heart failure) ([place])   ? Constipation   ? COVID-19   ? Diabetes mellitus type 2 in obese ([place]) 07/13/2013  ? Diverticulosis   ? Dyspnea   ? Erectile dysfunction 01/15/2013  ? Esophageal reflux 01/15/2013  ? Fatty liver 07/04/2015  ? GERD (gastroesophageal reflux disease)   ? Hiatal hernia   ? Hiatal hernia with gastroesophageal reflux 03/20/2014  ? Hyperglycemia 07/13/2013  ? Hyperplastic colon polyp   ? Hypertension   ? Internal hemorrhoids   ? Lower extremity edema   ? Mixed hyperlipidemia   ? Murmur   ? OSA (obstructive sleep apnea)   ? s/p UPPP  ? Plantar fasciitis   ? Pneumonia due to COVID-19 virus   ? Prediabetes   ? Reflux esophagitis   ? Shoulder pain   ? Stomach ulcer   ?  from PCP  ? Urinary hesitancy 09/30/2015  ? Vitamin D deficiency   ? ? ?Current Outpatient Medications  ?Medication Sig Dispense Refill  ? acetaminophen (TYLENOL) 500 MG tablet Take 1 tablet (500 mg total) by mouth every 6 (six) hours as needed. 60 tablet 0  ? albuterol (VENTOLIN HFA) 108 (90 Base) MCG/ACT inhaler Inhale 2 puffs into the lungs every 4 (four) hours as needed for wheezing or shortness of breath. 18 g 1  ? Alum & Mag Hydroxide-Simeth (MYLANTA PO) Take by mouth as needed.    ? amLODipine (NORVASC) 10 MG tablet Take 1 tab every evening. 90 tablet 1  ? ascorbic acid (VITAMIN C) 500 MG tablet Take 500 mg by mouth daily.    ? aspirin EC 81 MG tablet Take 81 mg by mouth daily. Swallow whole.    ? Bayer Microlet Lancets lancets Test blood sugars twice daily 100 each 0  ? Blood Glucose Monitoring Suppl (CONTOUR NEXT MONITOR) w/Device KIT 1 kit by Does not apply route 2 (two) times daily. 1 kit 0  ? calcium carbonate (TUMS EX) 750 MG chewable tablet Chew 1 tablet by mouth daily as needed for heartburn.    ? carvedilol (  COREG) 25 MG tablet Take 1 tablet (25 mg total) by mouth 2 (two) times daily with a meal. 60 tablet 3  ? Cholecalciferol (VITAMIN D) 50 MCG (2000 UT) CAPS Take 1 capsule (2,000 Units total) by mouth daily. 90 capsule 0  ? famotidine (PEPCID) 40 MG tablet Take 1 tablet (40 mg total) by mouth at bedtime. 30 tablet 3  ? fluticasone (FLONASE) 50 MCG/ACT nasal spray Use 2 spray(s) in each nostril once daily 16 g 0  ? glucose blood (CONTOUR NEXT TEST) test strip Test blood sugars twice daily 100 each 0  ? ibuprofen (ADVIL) 800 MG tablet Take 1 tablet by mouth every 6 (six) hours as needed.    ? losartan (COZAAR) 100 MG tablet Take 1 tablet (100 mg total) by mouth daily. 90 tablet 0  ? Misc. Devices (PULSE OXIMETER FOR FINGER) MISC 1 Device by Does not apply route as needed (SOB, fatigue, headache patient with COVID). 1 each 0  ? Multiple Vitamins-Minerals (MULTIVITAMIN WITH MINERALS) tablet Take 1  tablet by mouth daily. 100 tablet 5  ? pantoprazole (PROTONIX) 40 MG tablet Take 1 tablet (40 mg total) by mouth 2 (two) times daily before a meal. 60 tablet 3  ? rosuvastatin (CRESTOR) 40 MG tablet Take 1 tablet (40 mg total) by mouth daily. 90 tablet 1  ? tadalafil (CIALIS) 5 MG tablet Take 1 tablet (5 mg total) by mouth daily as needed for erectile dysfunction. 30 tablet 0  ? tamsulosin (FLOMAX) 0.4 MG CAPS capsule Take 1 capsule by mouth once daily 30 capsule 0  ? tiZANidine (ZANAFLEX) 2 MG tablet Take 0.5-2 tablets (1-4 mg total) by mouth at bedtime as needed for muscle spasms. 20 tablet 1  ? triamterene-hydrochlorothiazide (MAXZIDE-25) 37.5-25 MG tablet Take 1 tablet by mouth daily. 90 tablet 1  ? Vitamin D, Ergocalciferol, (DRISDOL) 1.25 MG (50000 UNIT) CAPS capsule Take one tablet by mouth weekly for 12 weeks 4 capsule 3  ? zinc gluconate 50 MG tablet Take 50 mg by mouth daily.    ? metFORMIN (GLUCOPHAGE) 500 MG tablet TAKE 1 TABLET BY MOUTH TWICE DAILY WITH A MEAL (Patient not taking: Reported on 11/25/2021) 60 tablet 0  ? tirzepatide (MOUNJARO) 5 MG/0.5ML Pen Inject 5 mg into the skin once a week. (Patient not taking: Reported on 11/14/2021) 6 mL 0  ? ?Current Facility-Administered Medications  ?Medication Dose Route Frequency Provider Last Rate Last Admin  ? 0.9 %  sodium chloride infusion  500 mL Intravenous Once Pyrtle, Lajuan Lines, MD      ? ? ?Allergies  ?Allergen Reactions  ? Hydrocodone Itching  ? Hydromorphone Itching  ? Tramadol Itching  ? ? ?  ?Social History  ? ?Socioeconomic History  ? Marital status: Married  ?  Spouse name: [name]  ? Number of children: Not on file  ? Years of education: Not on file  ? Highest education level: Not on file  ?Occupational History  ? Occupation: Scientist, forensic  ?  Employer: [employer]  ?Tobacco Use  ? Smoking status: Never  ?  Passive exposure: Never  ? Smokeless tobacco: Never  ?Vaping Use  ? Vaping Use: Never used  ?Substance and Sexual Activity  ? Alcohol  use: Not Currently  ?  Comment: 1989 quit  ? Drug use: Never  ? Sexual activity: Yes  ?  Partners: Female  ?Other Topics Concern  ? Not on file  ?Social History Narrative  ? Tobacco Use - No.   ? 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 grew up in [place] area  ? Married - 13 years  ? Alcohol Use - no  ? Regular Exercise - yes  ? Drug Use - no  ? 3 girls   ? 3 boys  ? Smoking Status:     ? Packs/Day:    ? Caffeine use/day:  1 cup coffee every other day  ? Does Patient Exercise:  no  ? ?Social Determinants of Health  ? ?Financial Resource Strain: Not on file  ?Food Insecurity: Not on file  ?Transportation Needs: Not on file  ?Physical Activity: Not on file  ?Stress: Not on file  ?Social Connections: Not on file  ?Intimate Partner Violence: Not on file  ? ? ?  ?Family History  ?Problem Relation Age of Onset  ? Dementia Mother   ? Diabetes Mother   ? Hypertension Mother   ? Heart failure Mother   ? Stroke Mother   ? Obesity Mother   ? Hyperlipidemia Mother   ? Heart disease Mother   ? Depression Mother   ? Obesity Father   ? Alcoholism Father   ? Diabetes Sister   ? Diabetes Sister   ? Pancreatic disease Sister   ? Cancer Brother   ?     lung cancer smoker  ? Heart attack Maternal Grandmother   ? Hypertension Other   ?     siblings  ? Colon cancer Neg Hx   ? Esophageal cancer Neg Hx   ? Stomach cancer Neg Hx   ? Rectal cancer Neg Hx   ? ? ?Vitals:  ? 12/08/21 1048  ?BP: 110/70  ?Pulse: 76  ?SpO2: 97%  ?Weight: 125.3 kg (276 lb 3.2 oz)  ? ?Wt Readings from Last 3 Encounters:  ?12/08/21 125.3 kg (276 lb 3.2 oz)  ?11/25/21 125.7 kg (277 lb 3.2 oz)  ?11/14/21 122 kg (269 lb)  ? ? ? ?PHYSICAL EXAM: ?General:  Well appearing. No resp difficulty ?HEENT: normal ?Neck: supple. no JVD. Carotids 2+ bilat; no bruits. No lymphadenopathy or thryomegaly appreciated. ?Cor: PMI nondisplaced. Regular rate & rhythm. No rubs, gallops or murmurs. ?Lungs: clear ?Abdomen: obese soft, nontender, nondistended. No hepatosplenomegaly.  No bruits or masses. Good bowel sounds. ?Extremities: no cyanosis, clubbing, rash, edema ?Neuro: alert & orientedx3, cranial nerves grossly intact. moves all 4 extremities w/o difficulty. Affect pleasant ? ? ?EC

## 2021-12-08 NOTE — Patient Instructions (Signed)
Stop Asprin. ? ? ?Your physician recommends that you schedule a follow-up appointment in: As needed. ? ? ?If you have any questions or concerns before your next appointment please send [place] a message through [place] or call our office at [phone].   ? ?TO LEAVE A MESSAGE FOR THE NURSE SELECT OPTION 2, PLEASE LEAVE A MESSAGE INCLUDING: ?YOUR NAME ?DATE OF BIRTH ?CALL BACK NUMBER ?REASON FOR CALL**this is important as we prioritize the call backs ? ?YOU WILL RECEIVE A CALL BACK THE SAME DAY AS LONG AS YOU CALL BEFORE 4:00 PM ? ?At the [facility], you and your health needs are our priority. As part of our continuing mission to provide you with exceptional heart care, we have created designated Provider Care Teams. These Care Teams include your primary Cardiologist (physician) and Advanced Practice Providers (APPs- Physician Assistants and Nurse Practitioners) who all work together to provide you with the care you need, when you need it.  ? ?You may see any of the following providers on your designated Care Team at your next follow up: ?Dr Glori Bickers ?Dr Loralie Champagne ?Darrick Grinder, NP ?Lyda Jester, PA ?Jessica Milford,NP ?Marlyce Huge, PA ?Audry Riles, PharmD ? ? ?Please be sure to bring in all your medications bottles to every appointment.  ? ? ?

## 2021-12-09 NOTE — Telephone Encounter (Signed)
Please tell patient I am sorry he has continued to have atypical chest pain ?I read the cardiology note and it is reassuring that this is not cardiac ?It has not responded to typical GERD treatment and has not responded to isosorbide ?I would recommend he stop the isosorbide dinitrate at this time ? ?This may not be esophageal but we can repeat esophageal manometry with 24-hour pH and impedance to better determine if there is esophageal spasm or uncontrolled reflux ? ?This can be ordered if he is agreeable ?

## 2021-12-12 NOTE — Telephone Encounter (Signed)
I left vm for [facility] to call pt to schedule CT and looks like they have tried to call him to schedule.  He just needs to call them at [phone] to schedule.  Will route back to triage so they can make pt aware thru MyChart and then message can be closed.   ?

## 2021-12-14 ENCOUNTER — Other Ambulatory Visit: Payer: Self-pay

## 2021-12-14 DIAGNOSIS — K219 Gastro-esophageal reflux disease without esophagitis: Secondary | ICD-10-CM

## 2021-12-14 DIAGNOSIS — R079 Chest pain, unspecified: Secondary | ICD-10-CM

## 2021-12-14 DIAGNOSIS — Z8719 Personal history of other diseases of the digestive system: Secondary | ICD-10-CM

## 2022-01-13 ENCOUNTER — Telehealth: Payer: Self-pay | Admitting: Podiatry

## 2022-01-13 NOTE — Telephone Encounter (Signed)
I received billing statement and I have a question. It has total charges payments and we've settled the case. I just wanted to know what [facility] means by adjustments. I'm trying to figure out if anything is owed to you guys. The dates of service were between 05/14/2018 - 12/27/2021.  ?

## 2022-01-18 ENCOUNTER — Encounter (HOSPITAL_COMMUNITY): Payer: Self-pay | Admitting: Internal Medicine

## 2022-01-18 NOTE — Progress Notes (Signed)
Attempted to obtain medical history via telephone, unable to reach at this time. I left a voicemail to return pre surgical testing department's phone call.  

## 2022-01-25 ENCOUNTER — Encounter (HOSPITAL_COMMUNITY): Payer: Self-pay | Admitting: Internal Medicine

## 2022-01-25 ENCOUNTER — Ambulatory Visit (HOSPITAL_COMMUNITY)
Admission: RE | Admit: 2022-01-25 | Discharge: 2022-01-25 | Disposition: A | Discharge status: Home / Self Care | Payer: No Typology Code available for payment source | Attending: Internal Medicine | Admitting: Internal Medicine

## 2022-01-25 ENCOUNTER — Encounter (HOSPITAL_COMMUNITY)
Admission: RE | Disposition: A | Discharge status: Home / Self Care | Payer: Self-pay | Source: Home / Self Care | Attending: Internal Medicine

## 2022-01-25 DIAGNOSIS — R0789 Other chest pain: Secondary | ICD-10-CM

## 2022-01-25 DIAGNOSIS — K219 Gastro-esophageal reflux disease without esophagitis: Secondary | ICD-10-CM | POA: Diagnosis not present

## 2022-01-25 HISTORY — PX: ESOPHAGEAL MANOMETRY: SHX5429

## 2022-01-25 HISTORY — PX: PH IMPEDANCE STUDY: SHX5565

## 2022-01-25 SURGERY — MANOMETRY, ESOPHAGUS
Anesthesia: LOCAL

## 2022-01-25 MED ORDER — LIDOCAINE VISCOUS HCL 2 % MT SOLN
OROMUCOSAL | Status: AC
  Filled 2022-01-25: qty 15

## 2022-01-25 SURGICAL SUPPLY — 2 items
FACESHIELD LNG OPTICON STERILE (SAFETY) IMPLANT
GLOVE BIO SURGEON STRL SZ8 (GLOVE) ×4 IMPLANT

## 2022-01-25 NOTE — Progress Notes (Signed)
Esophageal manometry performed per protocol.  Patient tolerated procedure without any diffuculties.  PH probe then placed at 37 cm at left nare.  Written and verbal education provided on use of equipment and when to return to Endoscopy to have probe removed.  Patient verbalized understanding of all instructions.  Report sent to Dr. Harl Bowie.    ?

## 2022-01-25 NOTE — Progress Notes (Signed)
Manometry Pt. ?

## 2022-01-26 ENCOUNTER — Telehealth: Payer: Self-pay | Admitting: Internal Medicine

## 2022-01-26 NOTE — Telephone Encounter (Signed)
I have looked upfront and in Dr. Orvil Feil cabinet and was not able to locate disc   Dr. Chase Caller have to seen disc on this patient

## 2022-01-26 NOTE — Telephone Encounter (Signed)
His CT in our system was in feb 2023. After I Saw him in Mar 2023 I orderd a HRCT. He called in 12/05/21 asking if he can have it at [place]. I do not think that was done there. Maybe he did it somewhere else. I do not have the CD Rom with me

## 2022-01-27 ENCOUNTER — Encounter (HOSPITAL_COMMUNITY): Payer: Self-pay | Admitting: Internal Medicine

## 2022-01-30 NOTE — Telephone Encounter (Signed)
Pt states he received his CT scan done at [facility]. Pt states the scan was done in March 2023. Pt states he will see if he can get another report of the CT scan. I looked in chart and nothing regarding the CT scan is uploaded. Patient states he will call us back if he needs anything from [place]. Nothing further needed at this time

## 2022-02-03 ENCOUNTER — Encounter (HOSPITAL_COMMUNITY): Payer: Medicare Other | Admitting: Internal Medicine

## 2022-02-07 ENCOUNTER — Encounter: Payer: Self-pay | Admitting: Family Medicine

## 2022-02-07 ENCOUNTER — Ambulatory Visit (INDEPENDENT_AMBULATORY_CARE_PROVIDER_SITE_OTHER): Payer: No Typology Code available for payment source | Admitting: Family Medicine

## 2022-02-07 VITALS — BP 138/84 | HR 67 | Resp 20 | Ht 72.0 in | Wt 275.0 lb

## 2022-02-07 DIAGNOSIS — R109 Unspecified abdominal pain: Secondary | ICD-10-CM | POA: Diagnosis not present

## 2022-02-07 DIAGNOSIS — E559 Vitamin D deficiency, unspecified: Secondary | ICD-10-CM

## 2022-02-07 DIAGNOSIS — M545 Low back pain, unspecified: Secondary | ICD-10-CM

## 2022-02-07 DIAGNOSIS — E669 Obesity, unspecified: Secondary | ICD-10-CM | POA: Diagnosis not present

## 2022-02-07 DIAGNOSIS — I10 Essential (primary) hypertension: Secondary | ICD-10-CM | POA: Diagnosis not present

## 2022-02-07 DIAGNOSIS — E785 Hyperlipidemia, unspecified: Secondary | ICD-10-CM

## 2022-02-07 DIAGNOSIS — M549 Dorsalgia, unspecified: Secondary | ICD-10-CM

## 2022-02-07 DIAGNOSIS — E1169 Type 2 diabetes mellitus with other specified complication: Secondary | ICD-10-CM

## 2022-02-07 DIAGNOSIS — K449 Diaphragmatic hernia without obstruction or gangrene: Secondary | ICD-10-CM

## 2022-02-07 DIAGNOSIS — R351 Nocturia: Secondary | ICD-10-CM

## 2022-02-07 DIAGNOSIS — K219 Gastro-esophageal reflux disease without esophagitis: Secondary | ICD-10-CM

## 2022-02-07 LAB — URINALYSIS
Bilirubin Urine: NEGATIVE
Hgb urine dipstick: NEGATIVE
Ketones, ur: NEGATIVE
Leukocytes,Ua: NEGATIVE
Nitrite: NEGATIVE
Specific Gravity, Urine: 1.02 (ref 1.000–1.030)
Total Protein, Urine: NEGATIVE
Urine Glucose: NEGATIVE
Urobilinogen, UA: 0.2 (ref 0.0–1.0)
pH: 6 (ref 5.0–8.0)

## 2022-02-07 LAB — COMPREHENSIVE METABOLIC PANEL
ALT: 31 U/L (ref 0–53)
AST: 20 U/L (ref 0–37)
Albumin: 4.7 g/dL (ref 3.5–5.2)
Alkaline Phosphatase: 75 U/L (ref 39–117)
BUN: 15 mg/dL (ref 6–23)
CO2: 25 mEq/L (ref 19–32)
Calcium: 9.9 mg/dL (ref 8.4–10.5)
Chloride: 104 mEq/L (ref 96–112)
Creatinine, Ser: 1.01 mg/dL (ref 0.40–1.50)
GFR: 84.44 mL/min (ref >60.00)
Glucose, Bld: 93 mg/dL (ref 70–99)
Potassium: 3.9 mEq/L (ref 3.5–5.1)
Sodium: 139 mEq/L (ref 135–145)
Total Bilirubin: 0.4 mg/dL (ref 0.2–1.2)
Total Protein: 7.6 g/dL (ref 6.0–8.3)

## 2022-02-07 LAB — TSH: TSH: 2.7 u[IU]/mL (ref 0.35–5.50)

## 2022-02-07 LAB — LIPID PANEL
Cholesterol: 242 mg/dL — ABNORMAL HIGH (ref 0–200)
HDL: 45.8 mg/dL (ref >39.00)
LDL Cholesterol: 169 mg/dL — ABNORMAL HIGH (ref 0–99)
NonHDL: 196.22
Total CHOL/HDL Ratio: 5
Triglycerides: 137 mg/dL (ref 0.0–149.0)
VLDL: 27.4 mg/dL (ref 0.0–40.0)

## 2022-02-07 LAB — CBC
HCT: 45.9 % (ref 39.0–52.0)
Hemoglobin: 15.2 g/dL (ref 13.0–17.0)
MCHC: 33.2 g/dL (ref 30.0–36.0)
MCV: 87.1 fl (ref 78.0–100.0)
Platelets: 215 10*3/uL (ref 150.0–400.0)
RBC: 5.27 Mil/uL (ref 4.22–5.81)
RDW: 14.7 % (ref 11.5–15.5)
WBC: 4.6 10*3/uL (ref 4.0–10.5)

## 2022-02-07 LAB — HEMOGLOBIN A1C: Hgb A1c MFr Bld: 6.5 % (ref 4.6–6.5)

## 2022-02-07 NOTE — Assessment & Plan Note (Signed)
Had an esophageal manometry study with gastroenterology recently but has not heard results yet. Avoid offending foods, start probiotics. Do not eat large meals in late evening and consider raising head of bed.

## 2022-02-07 NOTE — Progress Notes (Signed)
Subjective:   By signing my name below, I, Zite Okoli, attest that this documentation has been prepared under the direction and in the presence of [name], MD. 02/07/2022      Patient ID: [name] Foster, male    DOB: 01-01-1966, 56 y.o.   MRN: [id number]  Chief Complaint  Patient presents with   Follow-up   Flank Pain    Left side    HPI Patient is in today for an office visit and 3 month f/u  His blood pressure is elevated at this visit. He does not check his blood pressure at home. He adds he still has residual chest pain and recently had an esophageal manometry. BP Readings from Last 3 Encounters:  02/07/22 138/84  12/08/21 110/70  11/25/21 132/84     He has been having heartburn and acid reflux symptoms and is managing it by being conscious of his food intake. He takes ibuprofen and tylenol to manage the pain. He stopped taking mounjaro because it caused a heartburn flare-up.    He complains of lower back and flank pain for the past week. Notes standing up makes it better and twisting his upper body makes it worse and makes it hard to sleep.  No injuries. He reports nocturia. He adds that the pain is present most days and changing positions helps to relieve the pain. He also complains of weakness in his legs that is worsened by activity.    He takes weekly vitamin D pills.   He recently started a program called [facility] at [facility].  Past Medical History:  Diagnosis Date   Back pain    Bone spur    left foot   Chest pain    CHF (congestive heart failure) ([place])    Constipation    COVID-19    Diabetes mellitus type 2 in obese ([place]) 07/13/2013   Diverticulosis    Dyspnea    Erectile dysfunction 01/15/2013   Esophageal reflux 01/15/2013   Fatty liver 07/04/2015   GERD (gastroesophageal reflux disease)    Hiatal hernia    Hiatal hernia with gastroesophageal reflux 03/20/2014   Hyperglycemia 07/13/2013   Hyperplastic colon polyp    Hypertension    Internal  hemorrhoids    Lower extremity edema    Mixed hyperlipidemia    Murmur    OSA (obstructive sleep apnea)    s/p UPPP   Plantar fasciitis    Pneumonia due to COVID-19 virus    Prediabetes    Reflux esophagitis    Shoulder pain    Stomach ulcer    from PCP   Urinary hesitancy 09/30/2015   Vitamin D deficiency     Past Surgical History:  Procedure Laterality Date   57 HOUR [place] STUDY N/A 02/28/2016   Procedure: 24 HOUR [place] STUDY;  Surgeon: Jerene Bears, MD;  Location: [facility];  Service: Gastroenterology;  Laterality: N/A;   ESOPHAGEAL MANOMETRY N/A 09/01/2013   Procedure: ESOPHAGEAL MANOMETRY (EM);  Surgeon: Sable Feil, MD;  Location: [facility];  Service: Endoscopy;  Laterality: N/A;   ESOPHAGEAL MANOMETRY N/A 02/28/2016   Procedure: ESOPHAGEAL MANOMETRY (EM);  Surgeon: Jerene Bears, MD;  Location: [facility];  Service: Gastroenterology;  Laterality: N/A;   ESOPHAGEAL MANOMETRY N/A 01/25/2022   Procedure: ESOPHAGEAL MANOMETRY (EM);  Surgeon: Jerene Bears, MD;  Location: [facility];  Service: Gastroenterology;  Laterality: N/A;   FOOT TENDON TRANSFER Right    INGUINAL HERNIA REPAIR  [place] IMPEDANCE STUDY N/A 01/25/2022   Procedure: [place] IMPEDANCE STUDY;  Surgeon: Jerene Bears, MD;  Location: [facility];  Service: Gastroenterology;  Laterality: N/A;   TONSILLECTOMY     UMBILICAL HERNIA REPAIR     UPPER GASTROINTESTINAL ENDOSCOPY     UVULECTOMY      Family History  Problem Relation Age of Onset   Dementia Mother    Diabetes Mother    Hypertension Mother    Heart failure Mother    Stroke Mother    Obesity Mother    Hyperlipidemia Mother    Heart disease Mother    Depression Mother    Obesity Father    Alcoholism Father    Diabetes Sister    Diabetes Sister    Pancreatic disease Sister    Cancer Brother        lung cancer smoker   Heart attack Maternal Grandmother    Hypertension Other        siblings   Colon cancer Neg Hx    Esophageal cancer Neg Hx     Stomach cancer Neg Hx    Rectal cancer Neg Hx     Social History   Socioeconomic History   Marital status: Married    Spouse name: [name]   Number of children: Not on file   Years of education: Not on file   Highest education level: Not on file  Occupational History   Occupation: Nature conservation officer: [employer]  Tobacco Use   Smoking status: Never    Passive exposure: Never   Smokeless tobacco: Never  Vaping Use   Vaping Use: Never used  Substance and Sexual Activity   Alcohol use: Not Currently    Comment: 1989 quit   Drug use: Never   Sexual activity: Yes    Partners: Female  Other Topics Concern   Not on file  Social History Narrative   Tobacco Use - No.    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[employer])   grew up in [place] area   Married - 13 years   Alcohol Use - no   Regular Exercise - yes   Drug Use - no   3 girls    3 boys   Smoking Status:      Packs/Day:     Caffeine use/day:  1 cup coffee every other day   Does Patient Exercise:  no   Social Determinants of Radio broadcast assistant Strain: Not on file  Food Insecurity: Not on file  Transportation Needs: Not on file  Physical Activity: Not on file  Stress: Not on file  Social Connections: Not on file  Intimate Partner Violence: Not on file    Outpatient Medications Prior to Visit  Medication Sig Dispense Refill   acetaminophen (TYLENOL) 500 MG tablet Take 1 tablet (500 mg total) by mouth every 6 (six) hours as needed. 60 tablet 0   albuterol (VENTOLIN HFA) 108 (90 Base) MCG/ACT inhaler Inhale 2 puffs into the lungs every 4 (four) hours as needed for wheezing or shortness of breath. 18 g 1   Alum & Mag Hydroxide-Simeth (MYLANTA PO) Take by mouth as needed.     amLODipine (NORVASC) 10 MG tablet Take 1 tab every evening. 90 tablet 1   ascorbic acid (VITAMIN C) 500 MG tablet Take 500 mg by mouth daily.     Bayer Microlet Lancets lancets Test blood sugars twice daily 100 each 0    Blood Glucose  Monitoring Suppl (CONTOUR NEXT MONITOR) w/Device KIT 1 kit by Does not apply route 2 (two) times daily. 1 kit 0   calcium carbonate (TUMS EX) 750 MG chewable tablet Chew 1 tablet by mouth daily as needed for heartburn.     carvedilol (COREG) 25 MG tablet Take 1 tablet (25 mg total) by mouth 2 (two) times daily with a meal. 60 tablet 3   Cholecalciferol (VITAMIN D) 50 MCG (2000 UT) CAPS Take 1 capsule (2,000 Units total) by mouth daily. 90 capsule 0   famotidine (PEPCID) 40 MG tablet Take 1 tablet (40 mg total) by mouth at bedtime. 30 tablet 3   fluticasone (FLONASE) 50 MCG/ACT nasal spray Use 2 spray(s) in each nostril once daily 16 g 0   glucose blood (CONTOUR NEXT TEST) test strip Test blood sugars twice daily 100 each 0   losartan (COZAAR) 100 MG tablet Take 1 tablet (100 mg total) by mouth daily. 90 tablet 0   metFORMIN (GLUCOPHAGE) 500 MG tablet TAKE 1 TABLET BY MOUTH TWICE DAILY WITH A MEAL 60 tablet 0   Misc. Devices (PULSE OXIMETER FOR FINGER) MISC 1 Device by Does not apply route as needed (SOB, fatigue, headache patient with COVID). 1 each 0   Multiple Vitamins-Minerals (MULTIVITAMIN WITH MINERALS) tablet Take 1 tablet by mouth daily. 100 tablet 5   pantoprazole (PROTONIX) 40 MG tablet Take 1 tablet (40 mg total) by mouth 2 (two) times daily before a meal. 60 tablet 3   rosuvastatin (CRESTOR) 40 MG tablet Take 1 tablet (40 mg total) by mouth daily. 90 tablet 1   tadalafil (CIALIS) 5 MG tablet Take 1 tablet (5 mg total) by mouth daily as needed for erectile dysfunction. 30 tablet 0   tamsulosin (FLOMAX) 0.4 MG CAPS capsule Take 1 capsule by mouth once daily 30 capsule 0   tiZANidine (ZANAFLEX) 2 MG tablet Take 0.5-2 tablets (1-4 mg total) by mouth at bedtime as needed for muscle spasms. 20 tablet 1   triamterene-hydrochlorothiazide (MAXZIDE-25) 37.5-25 MG tablet Take 1 tablet by mouth daily. 90 tablet 1   Vitamin D, Ergocalciferol, (DRISDOL) 1.25 MG (50000 UNIT) CAPS  capsule Take one tablet by mouth weekly for 12 weeks 4 capsule 3   zinc gluconate 50 MG tablet Take 50 mg by mouth daily.     ibuprofen (ADVIL) 800 MG tablet Take 1 tablet by mouth every 6 (six) hours as needed.     tirzepatide [facility] 5 MG/0.5ML Pen Inject 5 mg into the skin once a week. 6 mL 0   0.9 %  sodium chloride infusion      No facility-administered medications prior to visit.    Allergies  Allergen Reactions   Hydrocodone Itching   Hydromorphone Itching   Tramadol Itching    Review of Systems  Constitutional:  Negative for fever and malaise/fatigue.  HENT:  Negative for congestion.   Eyes:  Negative for redness.  Respiratory:  Negative for shortness of breath.   Cardiovascular:  Positive for chest pain. Negative for palpitations and leg swelling.  Gastrointestinal:  Negative for abdominal pain, blood in stool and nausea.  Genitourinary:  Positive for flank pain (left) and frequency (nocturia). Negative for dysuria.  Musculoskeletal:  Positive for back pain (lower). Negative for falls.  Skin:  Negative for rash.  Neurological:  Positive for weakness (legs). Negative for dizziness, loss of consciousness and headaches.  Endo/Heme/Allergies:  Negative for polydipsia.  Psychiatric/Behavioral:  Negative for depression. The patient is not nervous/anxious.  Objective:    Physical Exam Constitutional:      Appearance: Normal appearance. He is not ill-appearing.  HENT:     Head: Normocephalic and atraumatic.     Right Ear: Tympanic membrane, ear canal and external ear normal.     Left Ear: Tympanic membrane, ear canal and external ear normal.  Eyes:     Conjunctiva/sclera: Conjunctivae normal.  Cardiovascular:     Rate and Rhythm: Normal rate and regular rhythm.     Heart sounds: Normal heart sounds. No murmur heard. Pulmonary:     Breath sounds: Normal breath sounds. No wheezing.  Abdominal:     General: Bowel sounds are normal. There is no distension.      Palpations: Abdomen is soft.     Tenderness: There is no abdominal tenderness.     Hernia: No hernia is present.  Musculoskeletal:     Cervical back: Neck supple.  Lymphadenopathy:     Cervical: No cervical adenopathy.  Skin:    General: Skin is warm and dry.  Neurological:     Mental Status: He is alert and oriented to person, place, and time.  Psychiatric:        Behavior: Behavior normal.    BP 138/84 (BP Location: Left Arm, Patient Position: Sitting)   Pulse 67   Resp 20   Ht 6' (1.829 m)   Wt 275 lb (124.7 kg)   SpO2 97%   BMI 37.30 kg/m  Wt Readings from Last 3 Encounters:  02/07/22 275 lb (124.7 kg)  12/08/21 276 lb 3.2 oz (125.3 kg)  11/25/21 277 lb 3.2 oz (125.7 kg)    Diabetic Foot Exam - Simple   No data filed    Lab Results  Component Value Date   WBC 4.5 10/30/2021   HGB 14.5 10/30/2021   HCT 43.4 10/30/2021   PLT 213 10/30/2021   GLUCOSE 93 10/30/2021   CHOL 203 (H) 07/19/2021   TRIG 144.0 07/19/2021   HDL 39.20 07/19/2021   LDLDIRECT 181.0 08/12/2020   LDLCALC 135 (H) 07/19/2021   ALT 31 10/30/2021   AST 21 10/30/2021   NA 136 10/30/2021   K 3.7 10/30/2021   CL 104 10/30/2021   CREATININE 1.16 10/30/2021   BUN 11 10/30/2021   CO2 28 10/30/2021   TSH 4.03 07/19/2021   PSA 3.64 12/08/2019   HGBA1C 7.4 (H) 07/19/2021   MICROALBUR 4.1 (H) 08/12/2020    Lab Results  Component Value Date   TSH 4.03 07/19/2021   Lab Results  Component Value Date   WBC 4.5 10/30/2021   HGB 14.5 10/30/2021   HCT 43.4 10/30/2021   MCV 85.3 10/30/2021   PLT 213 10/30/2021   Lab Results  Component Value Date   NA 136 10/30/2021   K 3.7 10/30/2021   CO2 28 10/30/2021   GLUCOSE 93 10/30/2021   BUN 11 10/30/2021   CREATININE 1.16 10/30/2021   BILITOT 0.5 10/30/2021   ALKPHOS 64 10/30/2021   AST 21 10/30/2021   ALT 31 10/30/2021   PROT 7.2 10/30/2021   ALBUMIN 3.9 10/30/2021   CALCIUM 8.9 10/30/2021   ANIONGAP 4 (L) 10/30/2021   GFR 72.42  10/18/2021   Lab Results  Component Value Date   CHOL 203 (H) 07/19/2021   Lab Results  Component Value Date   HDL 39.20 07/19/2021   Lab Results  Component Value Date   LDLCALC 135 (H) 07/19/2021   Lab Results  Component Value Date   TRIG 144.0  07/19/2021   Lab Results  Component Value Date   CHOLHDL 5 07/19/2021   Lab Results  Component Value Date   HGBA1C 7.4 (H) 07/19/2021       Assessment & Plan:   Problem List Items Addressed This Visit     Hyperlipidemia (Chronic)    Tolerating statin, encouraged heart healthy diet, avoid trans fats, minimize simple carbs and saturated fats. Increase exercise as tolerated       Relevant Orders   Lipid panel   Essential hypertension - Primary (Chronic)    Well controlled, no changes to meds. Encouraged heart healthy diet such as the DASH diet and exercise as tolerated. He is in a trial with [facility] called Level 2 and has wearable devices to track bp, sugar etc and he is trying to eat better and stay active       Relevant Orders   TSH   Comprehensive metabolic panel   CBC   Diabetes mellitus type 2 in obese ([name])    hgba1c acceptable, minimize simple carbs. Increase exercise as tolerated. Continue current meds but did not tolerate Mounjaro due to a spark in his reflux. Doing better off of it       Relevant Orders   Hemoglobin A1c   Back pain    Most notably in left flank and low back worse when lying or sitting, better when standing. Encouraged moist heat and gentle stretching as tolerated. May try NSAIDs and prescription meds as directed and report if symptoms worsen or seek immediate care. Tizanidine 1 mg prn and referred to sports med after xrays ordered today.        Hiatal hernia with gastroesophageal reflux    Had an esophageal manometry study with gastroenterology recently but has not heard results yet. Avoid offending foods, start probiotics. Do not eat large meals in late evening and consider raising head of bed.         Vitamin D deficiency    Supplement and monitor       Nocturia    Following with [facility] and is noted to have enlarged prostate c/w chronic prostatitis per their notes. Will request lab results from them and check a UA with culture       Other Visit Diagnoses     Left flank pain       Relevant Orders   Urinalysis   Urine Culture   DG Thoracic Spine 2 View   DG Lumbar Spine 2-3 Views   Ambulatory referral to Sports Medicine   Mid back pain on left side       Relevant Orders   DG Thoracic Spine 2 View   DG Lumbar Spine 2-3 Views   Ambulatory referral to Sports Medicine       No orders of the defined types were placed in this encounter.   I,Zite Okoli,acting as a Education administrator for Penni Homans, MD.,have documented all relevant documentation on the behalf of Penni Homans, MD,as directed by  Penni Homans, MD while in the presence of Penni Homans, MD.   I, [name], MD. , personally preformed the services described in this documentation.  All medical record entries made by the scribe were at my direction and in my presence.  I have reviewed the chart and discharge instructions (if applicable) and agree that the record reflects my personal performance and is accurate and complete. 02/07/2022

## 2022-02-07 NOTE — Assessment & Plan Note (Signed)
Tolerating statin, encouraged heart healthy diet, avoid trans fats, minimize simple carbs and saturated fats. Increase exercise as tolerated 

## 2022-02-07 NOTE — Patient Instructions (Signed)
Moist heat to flank, gentle stretching topical rubs such as Biofreeze, [employer], Aspercreme, Salon Pas Report if no improvement for further work up and treatment.   Acute Back Pain, Adult Acute back pain is sudden and usually short-lived. It is often caused by an injury to the muscles and tissues in the back. The injury may result from: A muscle, tendon, or ligament getting overstretched or torn. Ligaments are tissues that connect bones to each other. Lifting something improperly can cause a back strain. Wear and tear (degeneration) of the spinal disks. Spinal disks are circular tissue that provide cushioning between the bones of the spine (vertebrae). Twisting motions, such as while playing sports or doing yard work. A hit to the back. Arthritis. You may have a physical exam, lab tests, and imaging tests to find the cause of your pain. Acute back pain usually goes away with rest and home care. Follow these instructions at home: Managing pain, stiffness, and swelling Take over-the-counter and prescription medicines only as told by your health care provider. Treatment may include medicines for pain and inflammation that are taken by mouth or applied to the skin, or muscle relaxants. Your health care provider may recommend applying ice during the first 24-48 hours after your pain starts. To do this: Put ice in a plastic bag. Place a towel between your skin and the bag. Leave the ice on for 20 minutes, 2-3 times a day. Remove the ice if your skin turns bright red. This is very important. If you cannot feel pain, heat, or cold, you have a greater risk of damage to the area. If directed, apply heat to the affected area as often as told by your health care provider. Use the heat source that your health care provider recommends, such as a moist heat pack or a heating pad. Place a towel between your skin and the heat source. Leave the heat on for 20-30 minutes. Remove the heat if your skin turns bright  red. This is especially important if you are unable to feel pain, heat, or cold. You have a greater risk of getting burned. Activity  Do not stay in bed. Staying in bed for more than 1-2 days can delay your recovery. Sit up and stand up straight. Avoid leaning forward when you sit or hunching over when you stand. If you work at a desk, sit close to it so you do not need to lean over. Keep your chin tucked in. Keep your neck drawn back, and keep your elbows bent at a 90-degree angle (right angle). Sit high and close to the steering wheel when you drive. Add lower back (lumbar) support to your car seat, if needed. Take short walks on even surfaces as soon as you are able. Try to increase the length of time you walk each day. Do not sit, drive, or stand in one place for more than 30 minutes at a time. Sitting or standing for long periods of time can put stress on your back. Do not drive or use heavy machinery while taking prescription pain medicine. Use proper lifting techniques. When you bend and lift, use positions that put less stress on your back: [place] your knees. Keep the load close to your body. Avoid twisting. Exercise regularly as told by your health care provider. Exercising helps your back heal faster and helps prevent back injuries by keeping muscles strong and flexible. Work with a physical therapist to make a safe exercise program, as recommended by your health care provider.  Do any exercises as told by your physical therapist. Lifestyle Maintain a healthy weight. Extra weight puts stress on your back and makes it difficult to have good posture. Avoid activities or situations that make you feel anxious or stressed. Stress and anxiety increase muscle tension and can make back pain worse. Learn ways to manage anxiety and stress, such as through exercise. General instructions Sleep on a firm mattress in a comfortable position. Try lying on your side with your knees slightly bent. If you  lie on your back, put a pillow under your knees. Keep your head and neck in a straight line with your spine (neutral position) when using electronic equipment like smartphones or pads. To do this: Raise your smartphone or pad to look at it instead of bending your head or neck to look down. Put the smartphone or pad at the level of your face while looking at the screen. Follow your treatment plan as told by your health care provider. This may include: Cognitive or behavioral therapy. Acupuncture or massage therapy. Meditation or yoga. Contact a health care provider if: You have pain that is not relieved with rest or medicine. You have increasing pain going down into your legs or buttocks. Your pain does not improve after 2 weeks. You have pain at night. You lose weight without trying. You have a fever or chills. You develop nausea or vomiting. You develop abdominal pain. Get help right away if: You develop new bowel or bladder control problems. You have unusual weakness or numbness in your arms or legs. You feel faint. These symptoms may represent a serious problem that is an emergency. Do not wait to see if the symptoms will go away. Get medical help right away. Call your local emergency services (911 in the [place] Do not drive yourself to the hospital. Summary Acute back pain is sudden and usually short-lived. Use proper lifting techniques. When you bend and lift, use positions that put less stress on your back. Take over-the-counter and prescription medicines only as told by your health care provider, and apply heat or ice as told. This information is not intended to replace advice given to you by your health care provider. Make sure you discuss any questions you have with your health care provider. Document Revised: 11/19/2020 Document Reviewed: 11/19/2020 Elsevier Patient Education  [place].

## 2022-02-07 NOTE — Assessment & Plan Note (Signed)
Well controlled, no changes to meds. Encouraged heart healthy diet such as the DASH diet and exercise as tolerated. He is in a trial with [employer] called Level 2 and has wearable devices to track bp, sugar etc and he is trying to eat better and stay active

## 2022-02-07 NOTE — Assessment & Plan Note (Signed)
Most notably in left flank and low back worse when lying or sitting, better when standing. Encouraged moist heat and gentle stretching as tolerated. May try NSAIDs and prescription meds as directed and report if symptoms worsen or seek immediate care. Tizanidine 1 mg prn and referred to sports med after xrays ordered today.

## 2022-02-07 NOTE — Assessment & Plan Note (Signed)
Following with [facility] and is noted to have enlarged prostate c/w chronic prostatitis per their notes. Will request lab results from them and check a UA with culture

## 2022-02-07 NOTE — Assessment & Plan Note (Signed)
Supplement and monitor 

## 2022-02-07 NOTE — Assessment & Plan Note (Signed)
hgba1c acceptable, minimize simple carbs. Increase exercise as tolerated. Continue current meds but did not tolerate Mounjaro due to a spark in his reflux. Doing better off of it

## 2022-02-08 ENCOUNTER — Telehealth: Payer: Self-pay | Admitting: Internal Medicine

## 2022-02-08 ENCOUNTER — Telehealth: Payer: Self-pay

## 2022-02-08 ENCOUNTER — Other Ambulatory Visit: Payer: Self-pay

## 2022-02-08 DIAGNOSIS — E785 Hyperlipidemia, unspecified: Secondary | ICD-10-CM

## 2022-02-08 DIAGNOSIS — E669 Obesity, unspecified: Secondary | ICD-10-CM

## 2022-02-08 DIAGNOSIS — I10 Essential (primary) hypertension: Secondary | ICD-10-CM

## 2022-02-08 LAB — URINE CULTURE
MICRO NUMBER:: 13458041
Result:: NO GROWTH
SPECIMEN QUALITY:: ADEQUATE

## 2022-02-08 NOTE — Telephone Encounter (Signed)
See additional phone note. 

## 2022-02-08 NOTE — Telephone Encounter (Signed)
Spoke with pt and he is aware of results and recommnedations from EM with ph probe. He was not interested in trying the medication. Pt wanted to schedule appt to come and discuss surgery with Dr. Hilarie Fredrickson. Pt scheduled to see Dr. Hilarie Fredrickson 03/21/22 at 4pm. Pt aware of appt.

## 2022-02-08 NOTE — Telephone Encounter (Signed)
Inbound call from patient requesting results from procedure done on 5/18. Please advise.

## 2022-02-21 ENCOUNTER — Other Ambulatory Visit: Payer: Self-pay | Admitting: Family Medicine

## 2022-02-21 DIAGNOSIS — N529 Male erectile dysfunction, unspecified: Secondary | ICD-10-CM

## 2022-03-21 ENCOUNTER — Encounter: Payer: Self-pay | Admitting: Internal Medicine

## 2022-03-21 ENCOUNTER — Ambulatory Visit (INDEPENDENT_AMBULATORY_CARE_PROVIDER_SITE_OTHER): Payer: Medicare Other | Admitting: Internal Medicine

## 2022-03-21 VITALS — BP 166/80 | HR 100 | Ht 72.0 in | Wt 275.0 lb

## 2022-03-21 DIAGNOSIS — K2100 Gastro-esophageal reflux disease with esophagitis, without bleeding: Secondary | ICD-10-CM | POA: Diagnosis not present

## 2022-03-21 DIAGNOSIS — K219 Gastro-esophageal reflux disease without esophagitis: Secondary | ICD-10-CM

## 2022-03-21 DIAGNOSIS — R0789 Other chest pain: Secondary | ICD-10-CM

## 2022-03-21 NOTE — Patient Instructions (Signed)
If you are age 54 or older, your body mass index should be between 23-30. Your Body mass index is 37.3 kg/m. If this is out of the aforementioned range listed, please consider follow up with your Primary Care Provider.  If you are age 75 or younger, your body mass index should be between 19-25. Your Body mass index is 37.3 kg/m. If this is out of the aformentioned range listed, please consider follow up with your Primary Care Provider.   Continue current medications.  We will send a referral for [facility] and they will contact you to schedule an appointment.   The [facility] providers would like to encourage you to use [facility] to communicate with providers for non-urgent requests or questions.  Due to long hold times on the telephone, sending your provider a message by [facility] may be a faster and more efficient way to get a response.  Please allow 48 business hours for a response.  Please remember that this is for non-urgent requests.   It was a pleasure to see you today!  Thank you for trusting me with your gastrointestinal care!    Zenovia Jarred, MD

## 2022-03-22 NOTE — Progress Notes (Signed)
   Subjective:    Patient ID: [name] Foster, male    DOB: Aug 23, 1966, 56 y.o.   MRN: [id number]  HPI [name] Foster is a 56 year old male with GERD, persistent and recurrent atypical chest pain, esophageal hypersensitivity who is here for follow-up.  He is seen after recent esophageal manometry and pH testing.  He has continued to have intermittent heartburn and chest discomfort despite pantoprazole 40 mg twice daily, famotidine at night.  He frequently uses Tums which helps for short time.  Symptoms are worse at night.  Nonexertional.  He has had prior cardiac work-up which was negative.  Echo normal.  Nuclear stress test low in May 2020.  Symptoms are longstanding.  No dyspnea.  Did have prior EGD in 2017 with LA grade a esophagitis   Review of Systems As per HPI, otherwise negative  Current Medications, Allergies, Past Medical History, Past Surgical History, Family History and Social History were reviewed in [employer] record.    Objective:   Physical Exam BP (!) 166/80   Pulse 100   Ht 6' (1.829 m)   Wt 275 lb (124.7 kg)   BMI 37.30 kg/m  Gen: awake, alert, NAD HEENT: anicteric Ext: no c/c/e Neuro: nonfocal  Esophageal manometry: Normal relaxation of the EG junction; no significant peristaltic abnormality based on [place] classification criteria Esophageal impedance and 24-hour pH testing: Good acid suppression on PPI, increased weakly acidic reflux episodes with positive symptom correlation, hypersensitive esophagus      Assessment & Plan:  56 year old male with GERD, persistent and recurrent atypical chest pain, esophageal hypersensitivity who is here for follow-up.   GERD with history of mild esophagitis/hypersensitive esophagus/weekly persistent acid reflux despite maximal medical therapy and positive symptom correlation with recent pH study --his chest pain is felt secondary to GERD based on the extensive testing as discussed today and above.  He is  on maximal medical therapy and I have recommended surgical evaluation for Nissen fundoplication --Continue maximum medical therapy with pantoprazole 40 mg twice daily before meals and famotidine 20 mg at bedtime --Referral to Dr. Redmond Foster to consider Nissen fundoplication for all reasons discussed above; I did tell him that I could not be 100% sure that his heartburn and chest pain would resolve after surgery but I do think it would significantly help and lower the need and possibly eliminate the need for PPI. --Prior trial of amitriptyline unsuccessful  2.  CRC screening --prior negative colonoscopy, repeat in 2027  30 minutes total spent today including patient facing time, coordination of care, reviewing medical history/procedures/pertinent radiology studies, and documentation of the encounter.

## 2022-03-28 ENCOUNTER — Telehealth: Payer: Self-pay | Admitting: Family Medicine

## 2022-03-28 NOTE — Telephone Encounter (Signed)
Left message for patient to call back and schedule Medicare Annual Wellness Visit (AWV).   Please offer to do virtually or by telephone.  Left office number and my jabber [phone].  AWVI eligible as of 02/09/2022  Please schedule at anytime with Nurse Health Advisor.

## 2022-04-19 ENCOUNTER — Encounter (INDEPENDENT_AMBULATORY_CARE_PROVIDER_SITE_OTHER): Payer: Self-pay

## 2022-05-24 ENCOUNTER — Other Ambulatory Visit: Payer: Self-pay | Admitting: Family Medicine

## 2022-05-24 DIAGNOSIS — E559 Vitamin D deficiency, unspecified: Secondary | ICD-10-CM

## 2022-05-29 ENCOUNTER — Other Ambulatory Visit: Payer: No Typology Code available for payment source

## 2022-05-29 ENCOUNTER — Telehealth: Payer: Self-pay | Admitting: Family Medicine

## 2022-05-29 NOTE — Telephone Encounter (Signed)
Patient requesting prescription for prenidsone for chest congestion. Patient said it has been prescribed before.

## 2022-05-30 ENCOUNTER — Ambulatory Visit: Payer: Medicare Other | Admitting: Family Medicine

## 2022-05-31 NOTE — Telephone Encounter (Signed)
Called pt was able to get appointment with Percell Miller

## 2022-06-01 ENCOUNTER — Ambulatory Visit: Payer: Medicare Other | Admitting: Medical

## 2022-06-12 ENCOUNTER — Other Ambulatory Visit (INDEPENDENT_AMBULATORY_CARE_PROVIDER_SITE_OTHER): Payer: Medicare Other

## 2022-06-12 DIAGNOSIS — I10 Essential (primary) hypertension: Secondary | ICD-10-CM

## 2022-06-12 DIAGNOSIS — E669 Obesity, unspecified: Secondary | ICD-10-CM | POA: Diagnosis not present

## 2022-06-12 DIAGNOSIS — E785 Hyperlipidemia, unspecified: Secondary | ICD-10-CM

## 2022-06-12 DIAGNOSIS — E1169 Type 2 diabetes mellitus with other specified complication: Secondary | ICD-10-CM | POA: Diagnosis not present

## 2022-06-12 LAB — CBC WITH DIFFERENTIAL/PLATELET
Basophils Absolute: 0 10*3/uL (ref 0.0–0.1)
Basophils Relative: 0.4 % (ref 0.0–3.0)
Eosinophils Absolute: 0.1 10*3/uL (ref 0.0–0.7)
Eosinophils Relative: 2 % (ref 0.0–5.0)
HCT: 42.9 % (ref 39.0–52.0)
Hemoglobin: 14.8 g/dL (ref 13.0–17.0)
Lymphocytes Relative: 38.6 % (ref 12.0–46.0)
Lymphs Abs: 1.7 10*3/uL (ref 0.7–4.0)
MCHC: 34.5 g/dL (ref 30.0–36.0)
MCV: 86.6 fl (ref 78.0–100.0)
Monocytes Absolute: 0.3 10*3/uL (ref 0.1–1.0)
Monocytes Relative: 7.4 % (ref 3.0–12.0)
Neutro Abs: 2.2 10*3/uL (ref 1.4–7.7)
Neutrophils Relative %: 51.6 % (ref 43.0–77.0)
Platelets: 178 10*3/uL (ref 150.0–400.0)
RBC: 4.96 Mil/uL (ref 4.22–5.81)
RDW: 14.5 % (ref 11.5–15.5)
WBC: 4.3 10*3/uL (ref 4.0–10.5)

## 2022-06-12 LAB — LIPID PANEL
Cholesterol: 205 mg/dL — ABNORMAL HIGH (ref 0–200)
HDL: 34.2 mg/dL — ABNORMAL LOW (ref >39.00)
NonHDL: 170.96
Total CHOL/HDL Ratio: 6
Triglycerides: 392 mg/dL — ABNORMAL HIGH (ref 0.0–149.0)
VLDL: 78.4 mg/dL — ABNORMAL HIGH (ref 0.0–40.0)

## 2022-06-12 LAB — COMPREHENSIVE METABOLIC PANEL
ALT: 29 U/L (ref 0–53)
AST: 21 U/L (ref 0–37)
Albumin: 4.3 g/dL (ref 3.5–5.2)
Alkaline Phosphatase: 87 U/L (ref 39–117)
BUN: 14 mg/dL (ref 6–23)
CO2: 24 mEq/L (ref 19–32)
Calcium: 9.2 mg/dL (ref 8.4–10.5)
Chloride: 103 mEq/L (ref 96–112)
Creatinine, Ser: 1.05 mg/dL (ref 0.40–1.50)
GFR: 80.4 mL/min (ref >60.00)
Glucose, Bld: 128 mg/dL — ABNORMAL HIGH (ref 70–99)
Potassium: 3.8 mEq/L (ref 3.5–5.1)
Sodium: 137 mEq/L (ref 135–145)
Total Bilirubin: 0.3 mg/dL (ref 0.2–1.2)
Total Protein: 6.9 g/dL (ref 6.0–8.3)

## 2022-06-12 LAB — LDL CHOLESTEROL, DIRECT: Direct LDL: 140 mg/dL

## 2022-06-12 LAB — TSH: TSH: 1.75 u[IU]/mL (ref 0.35–5.50)

## 2022-06-12 LAB — HEMOGLOBIN A1C: Hgb A1c MFr Bld: 6.9 % — ABNORMAL HIGH (ref 4.6–6.5)

## 2022-06-23 ENCOUNTER — Other Ambulatory Visit: Payer: Self-pay

## 2022-06-23 DIAGNOSIS — N529 Male erectile dysfunction, unspecified: Secondary | ICD-10-CM

## 2022-06-23 MED ORDER — AMLODIPINE BESYLATE 10 MG PO TABS: ORAL_TABLET | ORAL | 1 refills | Status: DC

## 2022-06-23 MED ORDER — TADALAFIL 5 MG PO TABS: 5.0000 mg | ORAL_TABLET | Freq: Every day | ORAL | 3 refills | Status: DC | PRN

## 2022-06-23 MED ORDER — LOSARTAN POTASSIUM 100 MG PO TABS: 100.0000 mg | ORAL_TABLET | Freq: Every day | ORAL | 0 refills | Status: DC

## 2022-06-25 NOTE — Progress Notes (Unsigned)
 Subjective:    Patient ID: Wassim E Weekley, male    DOB: 05/28/1966, 56 y.o.   MRN: [id number]  No chief complaint on file.   HPI Patient is in today for follow up on chronic medical concerns. No recent febrile illness or acute hospitalizations.   Past Medical History:  Diagnosis Date   Back pain    Bone spur    left foot   Chest pain    CHF (congestive heart failure) ([name])    Constipation    COVID-19    Diabetes mellitus type 2 in obese ([name]) 07/13/2013   Diverticulosis    Dyspnea    Erectile dysfunction 01/15/2013   Esophageal reflux 01/15/2013   Fatty liver 07/04/2015   GERD (gastroesophageal reflux disease)    Hiatal hernia    Hiatal hernia with gastroesophageal reflux 03/20/2014   Hyperglycemia 07/13/2013   Hyperplastic colon polyp    Hypertension    Internal hemorrhoids    Lower extremity edema    Mixed hyperlipidemia    Murmur    OSA (obstructive sleep apnea)    s/p UPPP   Plantar fasciitis    Pneumonia due to COVID-19 virus    Prediabetes    Reflux esophagitis    Shoulder pain    Stomach ulcer    from PCP   Urinary hesitancy 09/30/2015   Vitamin D deficiency     Past Surgical History:  Procedure Laterality Date   24 HOUR PH STUDY N/A 02/28/2016   Procedure: 24 HOUR PH STUDY;  Surgeon: Jay M Pyrtle, MD;  Location: [facility];  Service: Gastroenterology;  Laterality: N/A;   ESOPHAGEAL MANOMETRY N/A 09/01/2013   Procedure: ESOPHAGEAL MANOMETRY (EM);  Surgeon: David R Patterson, MD;  Location: [facility];  Service: Endoscopy;  Laterality: N/A;   ESOPHAGEAL MANOMETRY N/A 02/28/2016   Procedure: ESOPHAGEAL MANOMETRY (EM);  Surgeon: Jay M Pyrtle, MD;  Location: [facility];  Service: Gastroenterology;  Laterality: N/A;   ESOPHAGEAL MANOMETRY N/A 01/25/2022   Procedure: ESOPHAGEAL MANOMETRY (EM);  Surgeon: Pyrtle, Jay M, MD;  Location: [facility];  Service: Gastroenterology;  Laterality: N/A;   FOOT TENDON TRANSFER Right    INGUINAL HERNIA REPAIR     PH  IMPEDANCE STUDY N/A 01/25/2022   Procedure: PH IMPEDANCE STUDY;  Surgeon: Pyrtle, Jay M, MD;  Location: [facility];  Service: Gastroenterology;  Laterality: N/A;   TONSILLECTOMY     UMBILICAL HERNIA REPAIR     UPPER GASTROINTESTINAL ENDOSCOPY     UVULECTOMY      Family History  Problem Relation Age of Onset   Dementia Mother    Diabetes Mother    Hypertension Mother    Heart failure Mother    Stroke Mother    Obesity Mother    Hyperlipidemia Mother    Heart disease Mother    Depression Mother    Obesity Father    Alcoholism Father    Diabetes Sister    Diabetes Sister    Pancreatic disease Sister    Cancer Brother        lung cancer smoker   Heart attack Maternal Grandmother    Hypertension Other        siblings   Colon cancer Neg Hx    Esophageal cancer Neg Hx    Stomach cancer Neg Hx    Rectal cancer Neg Hx    Pancreatic cancer Neg Hx     Social History   Socioeconomic History   Marital status: Married    Spouse   name: Sharonda   Number of children: Not on file   Years of education: Not on file   Highest education level: Not on file  Occupational History   Occupation: Bus Operator    Employer: [employer]  Tobacco Use   Smoking status: Never    Passive exposure: Never   Smokeless tobacco: Never  Vaping Use   Vaping Use: Never used  Substance and Sexual Activity   Alcohol use: Not Currently    Comment: 1989 quit   Drug use: Never   Sexual activity: Yes    Partners: Female  Other Topics Concern   Not on file  Social History Narrative   Tobacco Use - No.    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[place]   grew up in [place]   Married - 13 years   Alcohol Use - no   Regular Exercise - yes   Drug Use - no   3 girls    3 boys   Smoking Status:      Packs/Day:     Caffeine use/day:  1 cup coffee every other day   Does Patient Exercise:  no   Social Determinants of Health   Financial Resource Strain: Not on file  Food Insecurity:  Not on file  Transportation Needs: Not on file  Physical Activity: Not on file  Stress: Not on file  Social Connections: Not on file  Intimate Partner Violence: Not on file    Outpatient Medications Prior to Visit  Medication Sig Dispense Refill   acetaminophen (TYLENOL) 500 MG tablet Take 1 tablet (500 mg total) by mouth every 6 (six) hours as needed. 60 tablet 0   albuterol (VENTOLIN HFA) 108 (90 Base) MCG/ACT inhaler Inhale 2 puffs into the lungs every 4 (four) hours as needed for wheezing or shortness of breath. 18 g 1   Alum & Mag Hydroxide-Simeth (MYLANTA PO) Take by mouth as needed.     amLODipine (NORVASC) 10 MG tablet Take 1 tab every evening. 90 tablet 1   ascorbic acid (VITAMIN C) 500 MG tablet Take 500 mg by mouth daily.     Bayer Microlet Lancets lancets Test blood sugars twice daily 100 each 0   Blood Glucose Monitoring Suppl (CONTOUR NEXT MONITOR) w/Device KIT 1 kit by Does not apply route 2 (two) times daily. 1 kit 0   calcium carbonate (TUMS EX) 750 MG chewable tablet Chew 1 tablet by mouth daily as needed for heartburn.     carvedilol (COREG) 25 MG tablet Take 1 tablet (25 mg total) by mouth 2 (two) times daily with a meal. 60 tablet 3   famotidine (PEPCID) 40 MG tablet Take 1 tablet (40 mg total) by mouth at bedtime. 30 tablet 3   fluticasone (FLONASE) 50 MCG/ACT nasal spray Use 2 spray(s) in each nostril once daily 16 g 0   glucose blood (CONTOUR NEXT TEST) test strip Test blood sugars twice daily 100 each 0   losartan (COZAAR) 100 MG tablet Take 1 tablet (100 mg total) by mouth daily. 90 tablet 0   metFORMIN (GLUCOPHAGE) 500 MG tablet TAKE 1 TABLET BY MOUTH TWICE DAILY WITH A MEAL (Patient not taking: Reported on 03/21/2022) 60 tablet 0   Misc. Devices (PULSE OXIMETER FOR FINGER) MISC 1 Device by Does not apply route as needed (SOB, fatigue, headache patient with COVID). 1 each 0   Multiple Vitamins-Minerals (MULTIVITAMIN WITH MINERALS) tablet Take 1 tablet by mouth  daily. 100 tablet 5     pantoprazole (PROTONIX) 40 MG tablet Take 1 tablet (40 mg total) by mouth 2 (two) times daily before a meal. 60 tablet 3   rosuvastatin (CRESTOR) 40 MG tablet Take 1 tablet (40 mg total) by mouth daily. 90 tablet 1   tadalafil (CIALIS) 5 MG tablet Take 1 tablet (5 mg total) by mouth daily as needed for erectile dysfunction. 30 tablet 3   tamsulosin (FLOMAX) 0.4 MG CAPS capsule Take 1 capsule by mouth once daily 30 capsule 0   tiZANidine (ZANAFLEX) 2 MG tablet Take 0.5-2 tablets (1-4 mg total) by mouth at bedtime as needed for muscle spasms. (Patient not taking: Reported on 03/21/2022) 20 tablet 1   triamterene-hydrochlorothiazide (MAXZIDE-25) 37.5-25 MG tablet Take 1 tablet by mouth daily. 90 tablet 1   Vitamin D, Ergocalciferol, (DRISDOL) 1.25 MG (50000 UNIT) CAPS capsule Take 1 capsule by mouth once a week for 12 weeks. 12 capsule 0   zinc gluconate 50 MG tablet Take 50 mg by mouth daily.     No facility-administered medications prior to visit.    Allergies  Allergen Reactions   Hydrocodone Itching   Hydromorphone Itching   Tramadol Itching    Review of Systems  Constitutional:  Negative for fever and malaise/fatigue.  HENT:  Negative for congestion.   Eyes:  Negative for blurred vision.  Respiratory:  Negative for shortness of breath.   Cardiovascular:  Negative for chest pain, palpitations and leg swelling.  Gastrointestinal:  Negative for abdominal pain, blood in stool and nausea.  Genitourinary:  Negative for dysuria and frequency.  Musculoskeletal:  Positive for back pain and joint pain. Negative for falls.  Skin:  Negative for rash.  Neurological:  Negative for dizziness, loss of consciousness and headaches.  Endo/Heme/Allergies:  Negative for environmental allergies.  Psychiatric/Behavioral:  Negative for depression. The patient is not nervous/anxious.       Objective:    Physical Exam Constitutional:      General: He is not in acute distress.     Appearance: Normal appearance. He is obese. He is not ill-appearing or toxic-appearing.  HENT:     Head: Normocephalic and atraumatic.     Right Ear: External ear normal.     Left Ear: External ear normal.     Nose: Nose normal.     Mouth/Throat:     Mouth: Mucous membranes are moist.  Eyes:     General:        Right eye: No discharge.        Left eye: No discharge.     Extraocular Movements: Extraocular movements intact.     Conjunctiva/sclera: Conjunctivae normal.     Pupils: Pupils are equal, round, and reactive to light.  Cardiovascular:     Rate and Rhythm: Normal rate and regular rhythm.     Heart sounds: No murmur heard. Pulmonary:     Effort: Pulmonary effort is normal.     Breath sounds: Normal breath sounds.  Abdominal:     Palpations: There is no mass.     Tenderness: There is no abdominal tenderness. There is no guarding.  Musculoskeletal:     Cervical back: Normal range of motion and neck supple.  Skin:    Findings: No rash.  Neurological:     Mental Status: He is alert and oriented to person, place, and time.  Psychiatric:        Behavior: Behavior normal.   There were no vitals taken for this visit. Wt Readings from Last 3 Encounters:    03/21/22 275 lb (124.7 kg)  02/07/22 275 lb (124.7 kg)  12/08/21 276 lb 3.2 oz (125.3 kg)    Diabetic Foot Exam - Simple   No data filed    Lab Results  Component Value Date   WBC 4.3 06/12/2022   HGB 14.8 06/12/2022   HCT 42.9 06/12/2022   PLT 178.0 06/12/2022   GLUCOSE 128 (H) 06/12/2022   CHOL 205 (H) 06/12/2022   TRIG 392.0 (H) 06/12/2022   HDL 34.20 (L) 06/12/2022   LDLDIRECT 140.0 06/12/2022   LDLCALC 169 (H) 02/07/2022   ALT 29 06/12/2022   AST 21 06/12/2022   NA 137 06/12/2022   K 3.8 06/12/2022   CL 103 06/12/2022   CREATININE 1.05 06/12/2022   BUN 14 06/12/2022   CO2 24 06/12/2022   TSH 1.75 06/12/2022   PSA 3.64 12/08/2019   HGBA1C 6.9 (H) 06/12/2022   MICROALBUR 4.1 (H) 08/12/2020    Lab  Results  Component Value Date   TSH 1.75 06/12/2022   Lab Results  Component Value Date   WBC 4.3 06/12/2022   HGB 14.8 06/12/2022   HCT 42.9 06/12/2022   MCV 86.6 06/12/2022   PLT 178.0 06/12/2022   Lab Results  Component Value Date   NA 137 06/12/2022   K 3.8 06/12/2022   CO2 24 06/12/2022   GLUCOSE 128 (H) 06/12/2022   BUN 14 06/12/2022   CREATININE 1.05 06/12/2022   BILITOT 0.3 06/12/2022   ALKPHOS 87 06/12/2022   AST 21 06/12/2022   ALT 29 06/12/2022   PROT 6.9 06/12/2022   ALBUMIN 4.3 06/12/2022   CALCIUM 9.2 06/12/2022   ANIONGAP 4 (L) 10/30/2021   GFR 80.40 06/12/2022   Lab Results  Component Value Date   CHOL 205 (H) 06/12/2022   Lab Results  Component Value Date   HDL 34.20 (L) 06/12/2022   Lab Results  Component Value Date   LDLCALC 169 (H) 02/07/2022   Lab Results  Component Value Date   TRIG 392.0 (H) 06/12/2022   Lab Results  Component Value Date   CHOLHDL 6 06/12/2022   Lab Results  Component Value Date   HGBA1C 6.9 (H) 06/12/2022       Assessment & Plan:   Problem List Items Addressed This Visit     Hyperlipidemia (Chronic)    Tolerating statin, encouraged heart healthy diet, avoid trans fats, minimize simple carbs and saturated fats. Increase exercise as tolerated      Obesity    Encouraged DASH or MIND diet, decrease po intake and increase exercise as tolerated. Needs 7-8 hours of sleep nightly. Avoid trans fats, eat small, frequent meals every 4-5 hours with lean proteins, complex carbs and healthy fats. Minimize simple carbs, high fat foods and processed foods      Diabetes mellitus type 2 in obese ([name])    hgba1c acceptable, minimize simple carbs. Increase exercise as tolerated. Continue current meds Flu shot due.  Shingrix is the new shingles shot, 2 shots over 2-6 months, confirm coverage with insurance and document, then can return here for shots with nurse appt or at pharmacy Urine Micro Foot exam      Back pain     Encouraged moist heat and gentle stretching as tolerated. May try NSAIDs and prescription meds as directed and report if symptoms worsen or seek immediate care      Gastroesophageal reflux disease    Avoid offending foods, start probiotics. Do not eat large meals in late evening and consider  raising head of bed.       Vitamin D deficiency    Supplement and monitor       I am having Unique E. Levert maintain his acetaminophen, Contour Next Monitor, Pulse Oximeter For Finger, multivitamin with minerals, Bayer Microlet Lancets, Contour Next Test, zinc gluconate, ascorbic acid, fluticasone, metFORMIN, tamsulosin, albuterol, triamterene-hydrochlorothiazide, rosuvastatin, tiZANidine, calcium carbonate, Alum & Mag Hydroxide-Simeth (MYLANTA PO), pantoprazole, famotidine, carvedilol, Vitamin D (Ergocalciferol), amLODipine, tadalafil, and losartan.  No orders of the defined types were placed in this encounter.    Stacey [name], MD 

## 2022-06-25 NOTE — Assessment & Plan Note (Signed)
hgba1c acceptable, minimize simple carbs. Increase exercise as tolerated. Continue current meds Flu shot due.  Shingrix is the new shingles shot, 2 shots over 2-6 months, confirm coverage with insurance and document, then can return here for shots with nurse appt or at pharmacy Urine Micro He did not tolerate Mounjaro at 7.5 mg but did tolerate the 2.5 and 4 mg doses. Will attempt to restart Mounjaro at 2.5 mg dose

## 2022-06-25 NOTE — Assessment & Plan Note (Signed)
Supplement and monitor 

## 2022-06-25 NOTE — Assessment & Plan Note (Signed)
Encouraged moist heat and gentle stretching as tolerated. May try NSAIDs and prescription meds as directed and report if symptoms worsen or seek immediate care 

## 2022-06-25 NOTE — Assessment & Plan Note (Signed)
Avoid offending foods, start probiotics. Do not eat large meals in late evening and consider raising head of bed.  

## 2022-06-25 NOTE — Assessment & Plan Note (Signed)
Tolerating statin, encouraged heart healthy diet, avoid trans fats, minimize simple carbs and saturated fats. Increase exercise as tolerated 

## 2022-06-25 NOTE — Assessment & Plan Note (Addendum)
Encouraged DASH or MIND diet, decrease po intake and increase exercise as tolerated. Needs 7-8 hours of sleep nightly. Avoid trans fats, eat small, frequent meals every 4-5 hours with lean proteins, complex carbs and healthy fats. Minimize simple carbs, high fat foods and processed foods. He is ready to consider Gastric bypass at this point so referral is placed.

## 2022-06-26 ENCOUNTER — Telehealth: Payer: Self-pay | Admitting: *Deleted

## 2022-06-26 ENCOUNTER — Ambulatory Visit (INDEPENDENT_AMBULATORY_CARE_PROVIDER_SITE_OTHER): Payer: No Typology Code available for payment source | Admitting: Family Medicine

## 2022-06-26 VITALS — BP 144/82 | HR 87 | Temp 98.0°F | Resp 16 | Ht 72.0 in | Wt 284.0 lb

## 2022-06-26 DIAGNOSIS — E559 Vitamin D deficiency, unspecified: Secondary | ICD-10-CM

## 2022-06-26 DIAGNOSIS — Z23 Encounter for immunization: Secondary | ICD-10-CM

## 2022-06-26 DIAGNOSIS — E785 Hyperlipidemia, unspecified: Secondary | ICD-10-CM | POA: Diagnosis not present

## 2022-06-26 DIAGNOSIS — E669 Obesity, unspecified: Secondary | ICD-10-CM | POA: Diagnosis not present

## 2022-06-26 DIAGNOSIS — R490 Dysphonia: Secondary | ICD-10-CM

## 2022-06-26 DIAGNOSIS — E1169 Type 2 diabetes mellitus with other specified complication: Secondary | ICD-10-CM

## 2022-06-26 DIAGNOSIS — I10 Essential (primary) hypertension: Secondary | ICD-10-CM

## 2022-06-26 DIAGNOSIS — K2100 Gastro-esophageal reflux disease with esophagitis, without bleeding: Secondary | ICD-10-CM

## 2022-06-26 DIAGNOSIS — Z6831 Body mass index (BMI) 31.0-31.9, adult: Secondary | ICD-10-CM

## 2022-06-26 DIAGNOSIS — M545 Low back pain, unspecified: Secondary | ICD-10-CM

## 2022-06-26 MED ORDER — TIRZEPATIDE 2.5 MG/0.5ML ~~LOC~~ SOAJ: 2.5000 mg | SUBCUTANEOUS | 1 refills | Status: DC

## 2022-06-26 NOTE — Assessment & Plan Note (Signed)
Had continued to take the Losartan at just 50 mg daily until today when he took 100 mg, will continue him at the 100 mg daily and he will return in about a month for a bp check and cmp

## 2022-06-26 NOTE — Patient Instructions (Signed)

## 2022-06-26 NOTE — Telephone Encounter (Signed)
Prior auth started via cover my meds.  Awaiting determination.   Key: [name] Foster

## 2022-06-28 NOTE — Telephone Encounter (Signed)
PA approved. Effective 06/28/22 to 06/28/2023

## 2022-07-04 ENCOUNTER — Telehealth: Payer: Self-pay | Admitting: Family Medicine

## 2022-07-04 NOTE — Telephone Encounter (Signed)
Patient called to find out if a referral has been placed for ENT. Provided patient with information. He said that he needed a referral to a pain management and/or doctor who specializes in back pain. They have already been to a orthopaedic specialist so he wants someone else that can dig deeper into what is causing his back pain. Please call patient to advise where the referrals have been placed.

## 2022-07-05 ENCOUNTER — Telehealth: Payer: Self-pay | Admitting: Family Medicine

## 2022-07-05 ENCOUNTER — Other Ambulatory Visit: Payer: Self-pay

## 2022-07-05 ENCOUNTER — Other Ambulatory Visit: Payer: Self-pay | Admitting: Family Medicine

## 2022-07-05 DIAGNOSIS — M545 Low back pain, unspecified: Secondary | ICD-10-CM

## 2022-07-05 DIAGNOSIS — J069 Acute upper respiratory infection, unspecified: Secondary | ICD-10-CM

## 2022-07-05 DIAGNOSIS — I10 Essential (primary) hypertension: Secondary | ICD-10-CM

## 2022-07-05 DIAGNOSIS — E559 Vitamin D deficiency, unspecified: Secondary | ICD-10-CM

## 2022-07-05 DIAGNOSIS — E119 Type 2 diabetes mellitus without complications: Secondary | ICD-10-CM

## 2022-07-05 DIAGNOSIS — E1169 Type 2 diabetes mellitus with other specified complication: Secondary | ICD-10-CM

## 2022-07-05 DIAGNOSIS — N529 Male erectile dysfunction, unspecified: Secondary | ICD-10-CM

## 2022-07-05 MED ORDER — TIRZEPATIDE 2.5 MG/0.5ML ~~LOC~~ SOAJ: 2.5000 mg | SUBCUTANEOUS | 1 refills | Status: AC

## 2022-07-05 MED ORDER — TRIAMTERENE-HCTZ 37.5-25 MG PO TABS: 1.0000 | ORAL_TABLET | Freq: Every day | ORAL | 1 refills | Status: AC

## 2022-07-05 MED ORDER — MULTI-VITAMIN/MINERALS PO TABS: 1.0000 | ORAL_TABLET | Freq: Every day | ORAL | 5 refills | Status: AC

## 2022-07-05 MED ORDER — AMLODIPINE BESYLATE 10 MG PO TABS: ORAL_TABLET | ORAL | 1 refills | Status: AC

## 2022-07-05 MED ORDER — CONTOUR NEXT TEST VI STRP: ORAL_STRIP | 0 refills | Status: DC

## 2022-07-05 MED ORDER — TADALAFIL 5 MG PO TABS: 5.0000 mg | ORAL_TABLET | Freq: Every day | ORAL | 3 refills | Status: AC | PRN

## 2022-07-05 MED ORDER — ASCORBIC ACID 500 MG PO TABS: 500.0000 mg | ORAL_TABLET | Freq: Every day | ORAL | 1 refills | Status: AC

## 2022-07-05 MED ORDER — ROSUVASTATIN CALCIUM 40 MG PO TABS: 40.0000 mg | ORAL_TABLET | Freq: Every day | ORAL | 1 refills | Status: AC

## 2022-07-05 MED ORDER — METFORMIN HCL 500 MG PO TABS: 500.0000 mg | ORAL_TABLET | Freq: Two times a day (BID) | ORAL | 0 refills | Status: AC

## 2022-07-05 MED ORDER — LOSARTAN POTASSIUM 100 MG PO TABS: 100.0000 mg | ORAL_TABLET | Freq: Every day | ORAL | 0 refills | Status: DC

## 2022-07-05 MED ORDER — CONTOUR NEXT MONITOR W/DEVICE KIT: 1.0000 | PACK | Freq: Two times a day (BID) | 0 refills | Status: AC

## 2022-07-05 MED ORDER — VITAMIN D (ERGOCALCIFEROL) 1.25 MG (50000 UNIT) PO CAPS: ORAL_CAPSULE | ORAL | 0 refills | Status: AC

## 2022-07-05 MED ORDER — ALBUTEROL SULFATE HFA 108 (90 BASE) MCG/ACT IN AERS: 2.0000 | INHALATION_SPRAY | RESPIRATORY_TRACT | 1 refills | Status: DC | PRN

## 2022-07-05 MED ORDER — PANTOPRAZOLE SODIUM 40 MG PO TBEC: 40.0000 mg | DELAYED_RELEASE_TABLET | Freq: Two times a day (BID) | ORAL | 3 refills | Status: AC

## 2022-07-05 MED ORDER — FLUTICASONE PROPIONATE 50 MCG/ACT NA SUSP: NASAL | 0 refills | Status: AC

## 2022-07-05 MED ORDER — TAMSULOSIN HCL 0.4 MG PO CAPS: 0.4000 mg | ORAL_CAPSULE | Freq: Every day | ORAL | 0 refills | Status: AC

## 2022-07-05 MED ORDER — ZINC GLUCONATE 50 MG PO TABS: 50.0000 mg | ORAL_TABLET | Freq: Every day | ORAL | 1 refills | Status: AC

## 2022-07-05 NOTE — Telephone Encounter (Signed)
Yes the Xray

## 2022-07-05 NOTE — Telephone Encounter (Signed)
Called pt and medication sent

## 2022-07-05 NOTE — Telephone Encounter (Signed)
Patient called to say that [facility] has 3 requests for a medication but they don't recall which medication. Confirmed all the medications that have recently been called in to [facility]. Patient is not sure which he needs and said he updated the list with the nurse. Patient's wife wanted me to run down his med list for patient to advise which he needed or didn't need. Patient's wife requested the nurse to call and go over his current meds and decide what is missing so they can send a refill to [facility].

## 2022-07-05 NOTE — Telephone Encounter (Signed)
Pt stated upper and lower back pain

## 2022-07-07 ENCOUNTER — Other Ambulatory Visit: Payer: Self-pay

## 2022-07-11 ENCOUNTER — Telehealth: Payer: Self-pay | Admitting: *Deleted

## 2022-07-11 NOTE — Telephone Encounter (Signed)
Prior auth started via cover my meds.  Awaiting determination.  Key: [id number]

## 2022-07-12 ENCOUNTER — Ambulatory Visit: Payer: No Typology Code available for payment source | Admitting: Family Medicine

## 2022-07-13 NOTE — Telephone Encounter (Signed)
Spoke with insurance and it seems that it is not covered and will need ov notes.  Per ins form that is if he is unable to use meter.  Advised patient to call insurance to see what they will cover.  He stated that he is using an [employer] meter now and will call insurance before running out.

## 2022-07-24 NOTE — Progress Notes (Unsigned)
Pt here for Blood pressure check per "S blyth: 06/26/22 Return in about 4 weeks (around 07/24/2022) for 4 week nurse bp and pulse check and a lab appt 4 cmp, then fu visit in 4 monthsw/lab prior. "  Pt currently takes: Losartan 100 mg, Amlodipine 10 mg, Maxzide-25 mg   Pt reports compliance with medication.  BP today @ = HR =  Pt advised per

## 2022-07-25 ENCOUNTER — Ambulatory Visit (INDEPENDENT_AMBULATORY_CARE_PROVIDER_SITE_OTHER): Payer: No Typology Code available for payment source

## 2022-07-25 ENCOUNTER — Other Ambulatory Visit (INDEPENDENT_AMBULATORY_CARE_PROVIDER_SITE_OTHER): Payer: No Typology Code available for payment source

## 2022-07-25 DIAGNOSIS — E669 Obesity, unspecified: Secondary | ICD-10-CM

## 2022-07-25 DIAGNOSIS — E1169 Type 2 diabetes mellitus with other specified complication: Secondary | ICD-10-CM

## 2022-07-25 DIAGNOSIS — I10 Essential (primary) hypertension: Secondary | ICD-10-CM

## 2022-07-25 DIAGNOSIS — E559 Vitamin D deficiency, unspecified: Secondary | ICD-10-CM | POA: Diagnosis not present

## 2022-07-25 DIAGNOSIS — E785 Hyperlipidemia, unspecified: Secondary | ICD-10-CM

## 2022-07-25 LAB — COMPREHENSIVE METABOLIC PANEL WITH GFR
ALT: 31 U/L (ref 0–53)
AST: 16 U/L (ref 0–37)
Albumin: 4.6 g/dL (ref 3.5–5.2)
Alkaline Phosphatase: 80 U/L (ref 39–117)
BUN: 18 mg/dL (ref 6–23)
CO2: 26 meq/L (ref 19–32)
Calcium: 9.4 mg/dL (ref 8.4–10.5)
Chloride: 103 meq/L (ref 96–112)
Creatinine, Ser: 1.04 mg/dL (ref 0.40–1.50)
GFR: 81.27 mL/min
Glucose, Bld: 98 mg/dL (ref 70–99)
Potassium: 4 meq/L (ref 3.5–5.1)
Sodium: 136 meq/L (ref 135–145)
Total Bilirubin: 0.4 mg/dL (ref 0.2–1.2)
Total Protein: 7.6 g/dL (ref 6.0–8.3)

## 2022-07-25 LAB — CBC
HCT: 46.5 % (ref 39.0–52.0)
Hemoglobin: 15.4 g/dL (ref 13.0–17.0)
MCHC: 33.1 g/dL (ref 30.0–36.0)
MCV: 87.8 fl (ref 78.0–100.0)
Platelets: 259 10*3/uL (ref 150.0–400.0)
RBC: 5.3 Mil/uL (ref 4.22–5.81)
RDW: 14.8 % (ref 11.5–15.5)
WBC: 7.9 10*3/uL (ref 4.0–10.5)

## 2022-07-25 LAB — LIPID PANEL
Cholesterol: 245 mg/dL — ABNORMAL HIGH (ref 0–200)
HDL: 49.8 mg/dL (ref >39.00)
LDL Cholesterol: 175 mg/dL — ABNORMAL HIGH (ref 0–99)
NonHDL: 194.97
Total CHOL/HDL Ratio: 5
Triglycerides: 99 mg/dL (ref 0.0–149.0)
VLDL: 19.8 mg/dL (ref 0.0–40.0)

## 2022-07-25 LAB — HEMOGLOBIN A1C: Hgb A1c MFr Bld: 6.8 % — ABNORMAL HIGH (ref 4.6–6.5)

## 2022-07-25 LAB — VITAMIN D 25 HYDROXY (VIT D DEFICIENCY, FRACTURES): VITD: 36.26 ng/mL (ref 30.00–100.00)

## 2022-07-25 LAB — TSH: TSH: 2.73 u[IU]/mL (ref 0.35–5.50)

## 2022-07-26 ENCOUNTER — Other Ambulatory Visit: Payer: Self-pay

## 2022-08-18 ENCOUNTER — Other Ambulatory Visit: Payer: Self-pay | Admitting: Family Medicine

## 2022-08-19 ENCOUNTER — Other Ambulatory Visit: Payer: Self-pay | Admitting: Internal Medicine

## 2022-08-29 ENCOUNTER — Other Ambulatory Visit: Payer: Self-pay | Admitting: Family Medicine

## 2022-09-11 ENCOUNTER — Other Ambulatory Visit: Payer: Self-pay | Admitting: Family Medicine

## 2022-10-24 ENCOUNTER — Telehealth: Payer: Self-pay | Admitting: Pharmacist

## 2022-10-24 NOTE — Telephone Encounter (Signed)
Patient appearing on report for [employer] [facility] - Hypertension Control report due to last documented ambulatory blood pressure of 144/82 on 06/26/2022. Next appointment with PCP is 10/30/2022  Current antihypertensives with last Refill dates from Epic records:  Last Refill: 06/23/2022  Amlodipine 10 MG TABS (disp 90, 90d supply)   Last Refill: 07/06/2022 Carvedilol 25 MG TABS (disp 180, 90d supply)   Last Refill: 07/05/2022 Triamterene- hydrochlorothiazide 37.5-25 MG TABS (disp 90, 90d supply)   Last Refill: 06/23/2022 Losartan 100 MG TABS (disp 90, 90d supply)   Attempted outreached patient to discuss hypertension control and medication management.  Unable to reach patient - LM on VM with CB# [phone] or [phone]   Cherre Robins, PharmD Clinical Pharmacist [facility]

## 2022-10-27 ENCOUNTER — Ambulatory Visit: Payer: Medicare Other | Admitting: Pulmonary Disease

## 2022-10-29 NOTE — Assessment & Plan Note (Deleted)
Supplement and monitor 

## 2022-10-29 NOTE — Assessment & Plan Note (Deleted)
Well controlled, no changes to meds. Encouraged heart healthy diet such as the DASH diet and exercise as tolerated.  

## 2022-10-29 NOTE — Assessment & Plan Note (Deleted)
hgba1c acceptable, minimize simple carbs. Increase exercise as tolerated. Continue current meds 

## 2022-10-29 NOTE — Assessment & Plan Note (Deleted)
Encourage heart healthy diet such as MIND or DASH diet, increase exercise, avoid trans fats, simple carbohydrates and processed foods, consider a krill or fish or flaxseed oil cap daily.  Tolerating Rosuvastatin

## 2022-10-29 NOTE — Assessment & Plan Note (Deleted)
Encouraged DASH or MIND diet, decrease po intake and increase exercise as tolerated. Needs 7-8 hours of sleep nightly. Avoid trans fats, eat small, frequent meals every 4-5 hours with lean proteins, complex carbs and healthy fats. Minimize simple carbs, high fat foods and processed foods 

## 2022-10-30 ENCOUNTER — Ambulatory Visit: Payer: Medicare Other | Admitting: Family Medicine

## 2022-10-30 DIAGNOSIS — I10 Essential (primary) hypertension: Secondary | ICD-10-CM

## 2022-10-30 DIAGNOSIS — E669 Obesity, unspecified: Secondary | ICD-10-CM

## 2022-10-30 DIAGNOSIS — E785 Hyperlipidemia, unspecified: Secondary | ICD-10-CM

## 2022-10-30 DIAGNOSIS — E1169 Type 2 diabetes mellitus with other specified complication: Secondary | ICD-10-CM

## 2022-10-30 DIAGNOSIS — E559 Vitamin D deficiency, unspecified: Secondary | ICD-10-CM

## 2022-12-06 ENCOUNTER — Telehealth: Payer: Self-pay | Admitting: Internal Medicine

## 2022-12-06 NOTE — Telephone Encounter (Signed)
Patient is returning your call.  

## 2022-12-06 NOTE — Telephone Encounter (Signed)
Left message for patient's wife to return my call

## 2022-12-06 NOTE — Telephone Encounter (Signed)
Patient wife called because famotidine is no longer covered by insurance. She wants to know if it can be replaced by cimetidine and sent to [facility]

## 2022-12-07 NOTE — Telephone Encounter (Signed)
Dr Hilarie Fredrickson, ok to change patient to cimitizine due to insurance restrictions on famotidine?  Left voicemail to call back. Need to find out if patient ever went to [facility] to discuss Nissen Fundoplication as Dr Hilarie Fredrickson noted at office visit 03/2022.

## 2022-12-09 NOTE — Telephone Encounter (Signed)
Cimetidine can have additional interactions with other medications due to its metabolism Therefore I would prefer the patient by over-the-counter famotidine given insurance does not wish to pay for this.  It can be found often very affordably either through [email or web address] or at [place] has 20 mg tablets, #300 for about $14.

## 2022-12-11 NOTE — Telephone Encounter (Signed)
Patient has been advised of Dr Garth Schlatter' recommendation regarding famotidine. In addition, he indicates that he never heard from [facility] back in 03/2022 regarding nissen fundoplication consult. Will send new referral.

## 2022-12-14 NOTE — Telephone Encounter (Signed)
Referral to Dr Redmond Pulling [facility]) has been placed and records sent. Will await appointment information.

## 2022-12-27 NOTE — Telephone Encounter (Signed)
Patient is scheduled to see Dr Andrey Campanile on 01/03/23 at 9:00 am per [facility].

## 2023-01-11 ENCOUNTER — Other Ambulatory Visit: Payer: Self-pay | Admitting: Family Medicine

## 2023-01-11 DIAGNOSIS — E119 Type 2 diabetes mellitus without complications: Secondary | ICD-10-CM

## 2023-02-14 IMAGING — US US ABDOMEN COMPLETE
1 series · 14 of 25 positions shown · non-contrast
Comparison: None.

CLINICAL DATA: Abdominal pain.

EXAM:
ABDOMEN ULTRASOUND COMPLETE

[Series 1: us abdomen complete · 14 of 78 slices shown]
[im 1/78]
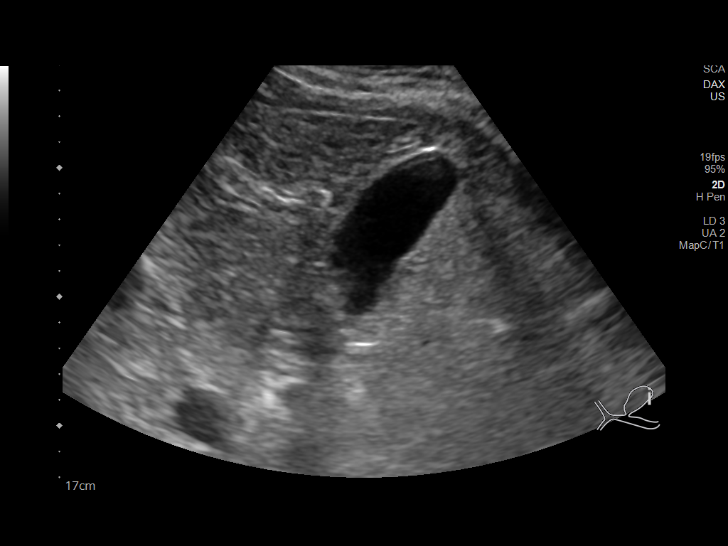
[im 7/78]
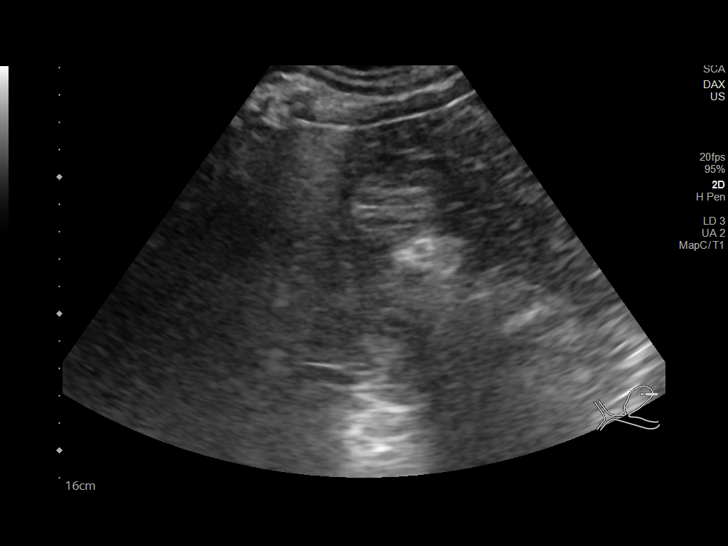
[im 13/78]
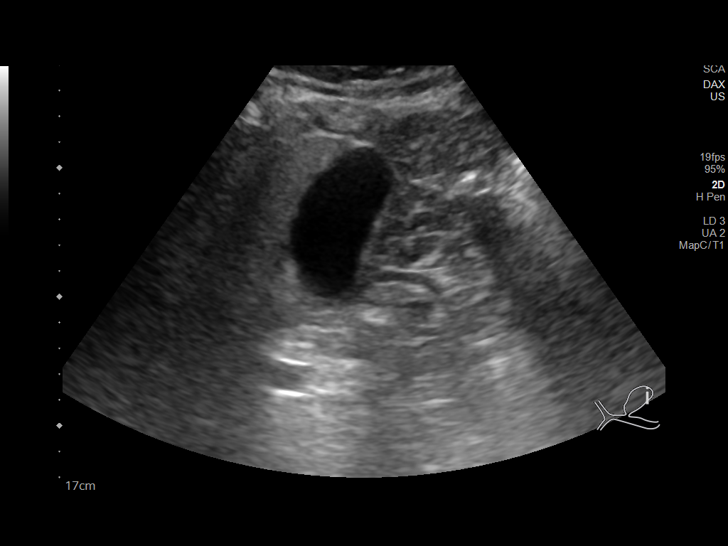
[im 20/78]
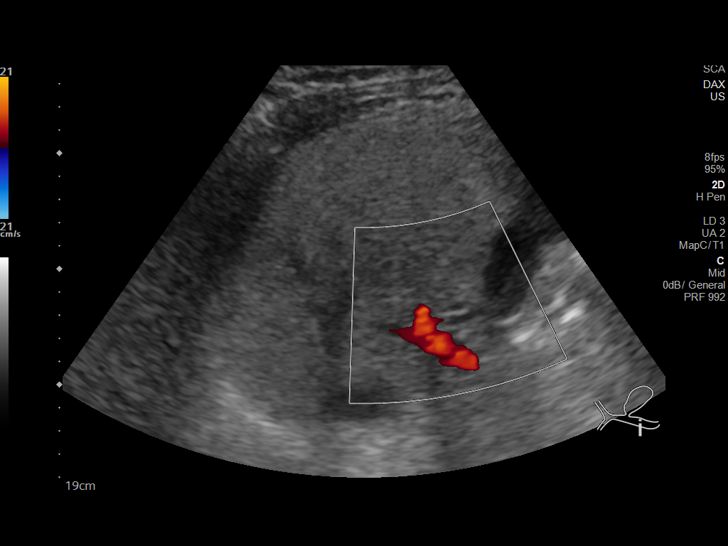
[im 26/78]
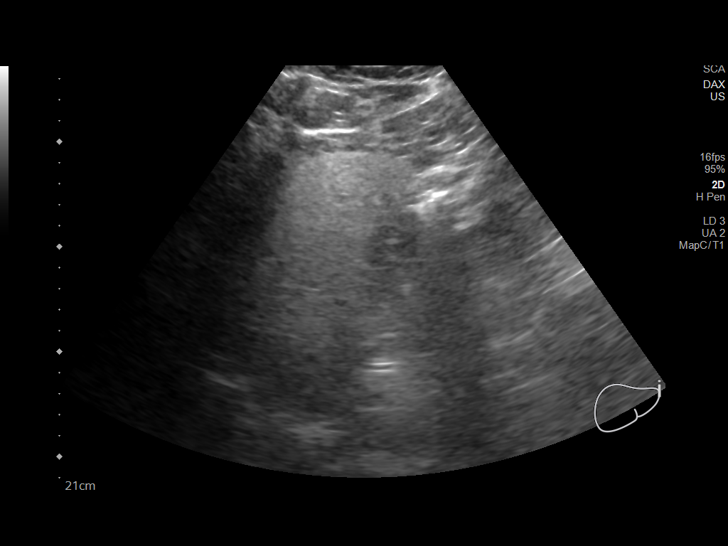
[im 29/78]
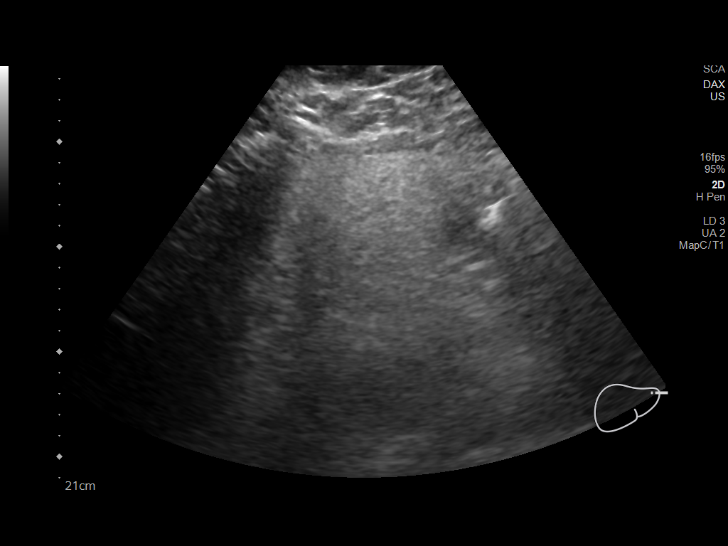
[im 36/78]
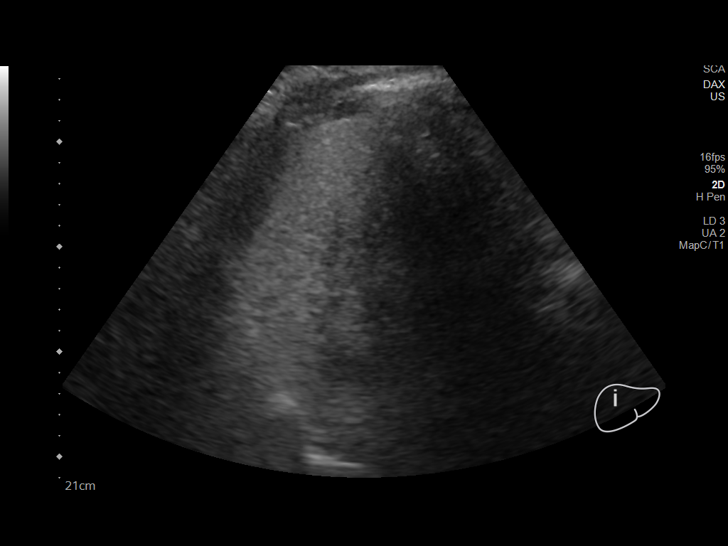
[im 42/78]
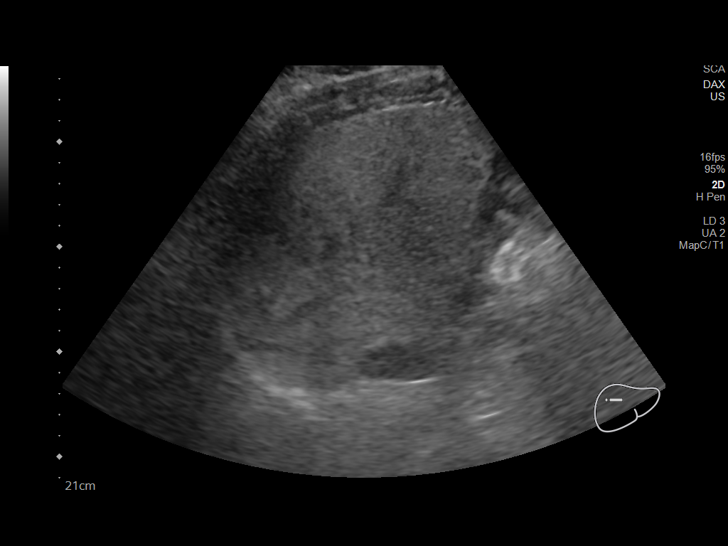
[im 49/78]
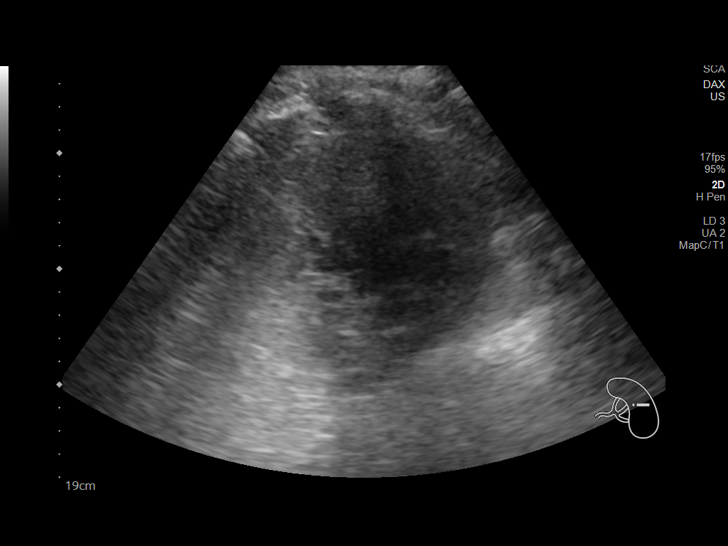
[im 52/78]
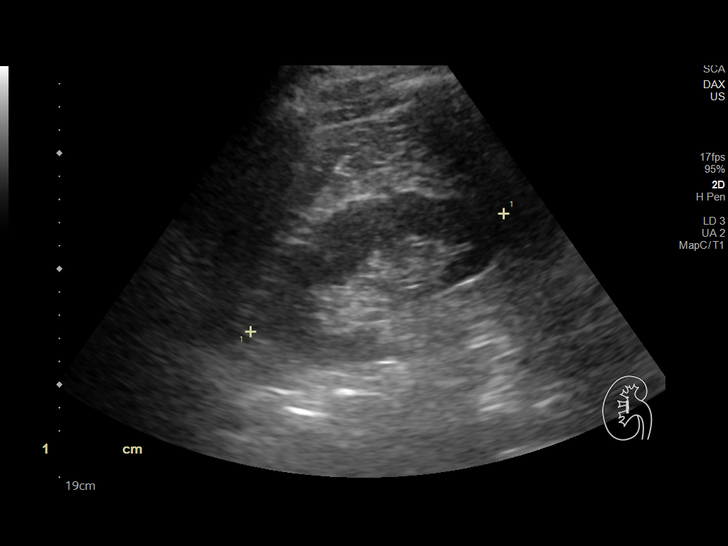
[im 58/78]
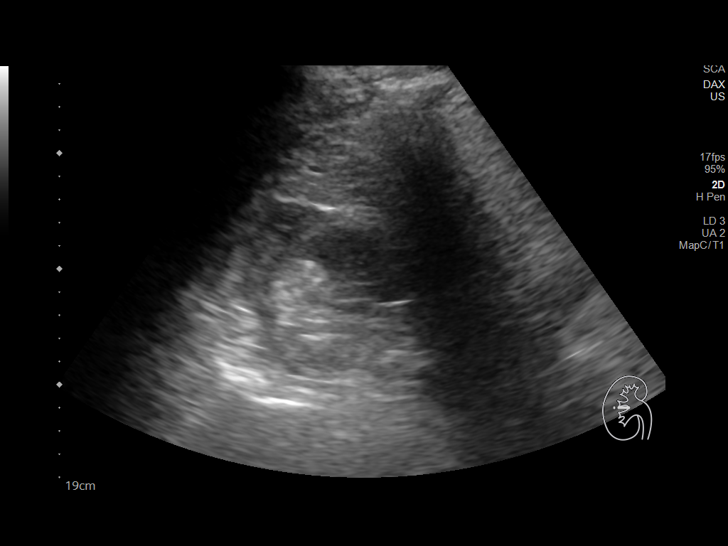
[im 65/78]
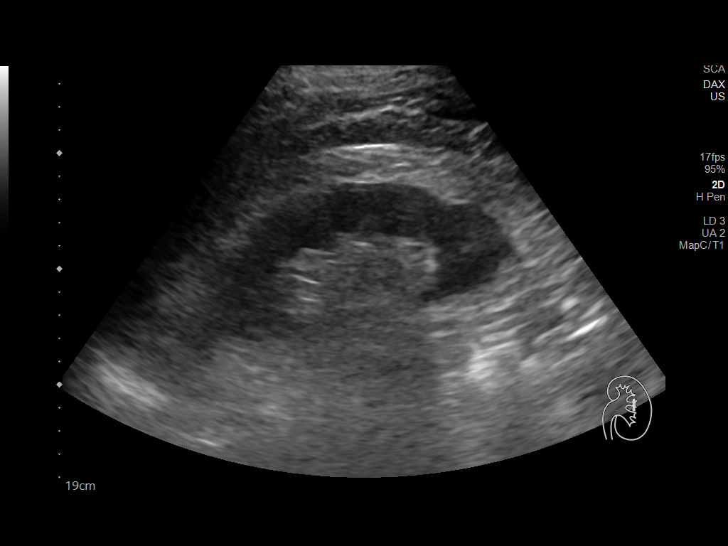
[im 71/78]
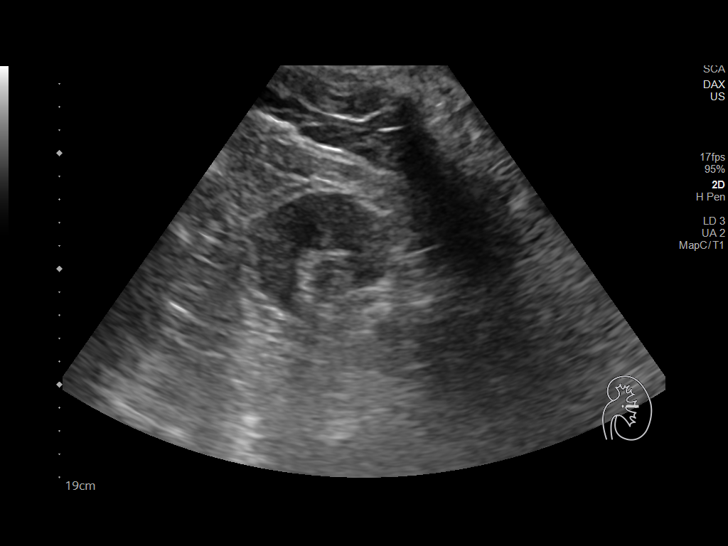
[im 78/78]
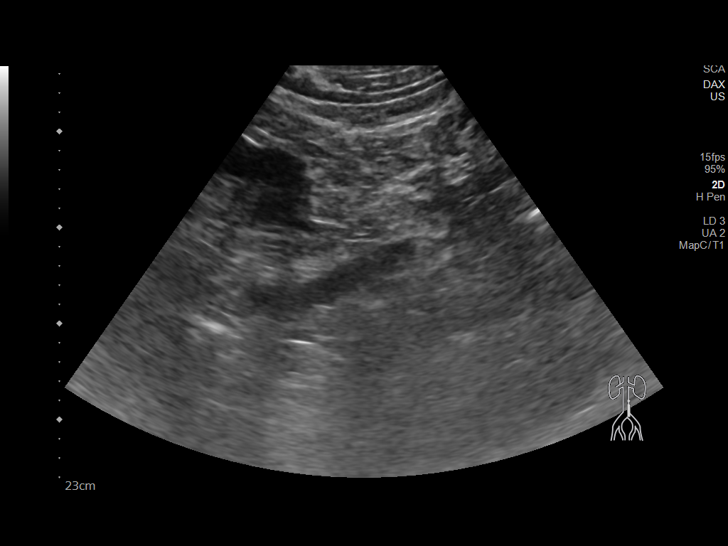

[14 of 25 positions shown; findings below may reference images not displayed]

FINDINGS: Evaluation is limited due to overlying bowel gas.

Gallbladder: No gallstones or wall thickening visualized. No
sonographic Murphy sign noted by sonographer.

Common bile duct: Diameter: 5 mm.

Liver: There is diffuse increased liver echogenicity most commonly
seen in the setting of fatty infiltration. Superimposed inflammation
or fibrosis is not excluded. Clinical correlation is recommended.
Portal vein is patent on color Doppler imaging with normal direction
of blood flow towards the liver.

IVC: No abnormality visualized.

Pancreas: Poorly visualized and obscured by bowel gas.

Spleen: Size and appearance within normal limits.

Right Kidney: Length: 12.1 cm. Normal echogenicity. No
hydronephrosis or shadowing stone.

Left Kidney: Length: 13.2 cm. Normal echogenicity. No hydronephrosis
or shadowing stone.

Abdominal aorta: No aneurysm visualized.

Other findings: None.
IMPRESSION: Fatty liver, otherwise unremarkable abdominal ultrasound.

## 2023-03-10 ENCOUNTER — Emergency Department (HOSPITAL_BASED_OUTPATIENT_CLINIC_OR_DEPARTMENT_OTHER): Payer: No Typology Code available for payment source

## 2023-03-10 ENCOUNTER — Emergency Department (HOSPITAL_BASED_OUTPATIENT_CLINIC_OR_DEPARTMENT_OTHER)
Admission: EM | Admit: 2023-03-10 | Discharge: 2023-03-10 | Disposition: A | Discharge status: Home / Self Care | Payer: No Typology Code available for payment source | Attending: Emergency Medicine | Admitting: Emergency Medicine

## 2023-03-10 ENCOUNTER — Encounter (HOSPITAL_BASED_OUTPATIENT_CLINIC_OR_DEPARTMENT_OTHER): Payer: Self-pay

## 2023-03-10 ENCOUNTER — Other Ambulatory Visit: Payer: Self-pay

## 2023-03-10 DIAGNOSIS — K5732 Diverticulitis of large intestine without perforation or abscess without bleeding: Secondary | ICD-10-CM | POA: Insufficient documentation

## 2023-03-10 DIAGNOSIS — K5792 Diverticulitis of intestine, part unspecified, without perforation or abscess without bleeding: Secondary | ICD-10-CM

## 2023-03-10 DIAGNOSIS — R1032 Left lower quadrant pain: Secondary | ICD-10-CM | POA: Diagnosis present

## 2023-03-10 DIAGNOSIS — Z79899 Other long term (current) drug therapy: Secondary | ICD-10-CM | POA: Diagnosis not present

## 2023-03-10 DIAGNOSIS — I10 Essential (primary) hypertension: Secondary | ICD-10-CM | POA: Insufficient documentation

## 2023-03-10 LAB — CBC WITH DIFFERENTIAL/PLATELET
Abs Immature Granulocytes: 0.01 10*3/uL (ref 0.00–0.07)
Basophils Absolute: 0 10*3/uL (ref 0.0–0.1)
Basophils Relative: 0 %
Eosinophils Absolute: 0.1 10*3/uL (ref 0.0–0.5)
Eosinophils Relative: 1 %
HCT: 44.6 % (ref 39.0–52.0)
Hemoglobin: 14.9 g/dL (ref 13.0–17.0)
Immature Granulocytes: 0 %
Lymphocytes Relative: 20 %
Lymphs Abs: 1.3 10*3/uL (ref 0.7–4.0)
MCH: 28.1 pg (ref 26.0–34.0)
MCHC: 33.4 g/dL (ref 30.0–36.0)
MCV: 84.2 fL (ref 80.0–100.0)
Monocytes Absolute: 0.5 10*3/uL (ref 0.1–1.0)
Monocytes Relative: 8 %
Neutro Abs: 4.4 10*3/uL (ref 1.7–7.7)
Neutrophils Relative %: 71 %
Platelets: 203 10*3/uL (ref 150–400)
RBC: 5.3 MIL/uL (ref 4.22–5.81)
RDW: 14 % (ref 11.5–15.5)
WBC: 6.4 10*3/uL (ref 4.0–10.5)
nRBC: 0 % (ref 0.0–0.2)

## 2023-03-10 LAB — URINALYSIS, ROUTINE W REFLEX MICROSCOPIC
Bilirubin Urine: NEGATIVE
Glucose, UA: NEGATIVE mg/dL
Hgb urine dipstick: NEGATIVE
Ketones, ur: NEGATIVE mg/dL
Leukocytes,Ua: NEGATIVE
Nitrite: NEGATIVE
Protein, ur: NEGATIVE mg/dL
Specific Gravity, Urine: 1.025 (ref 1.005–1.030)
pH: 7 (ref 5.0–8.0)

## 2023-03-10 LAB — BASIC METABOLIC PANEL
Anion gap: 10 (ref 5–15)
BUN: 14 mg/dL (ref 6–20)
CO2: 25 mmol/L (ref 22–32)
Calcium: 8.8 mg/dL — ABNORMAL LOW (ref 8.9–10.3)
Chloride: 102 mmol/L (ref 98–111)
Creatinine, Ser: 1 mg/dL (ref 0.61–1.24)
GFR, Estimated: >60 mL/min (ref >60)
Glucose, Bld: 99 mg/dL (ref 70–99)
Potassium: 3.5 mmol/L (ref 3.5–5.1)
Sodium: 137 mmol/L (ref 135–145)

## 2023-03-10 MED ORDER — AMOXICILLIN-POT CLAVULANATE 875-125 MG PO TABS
1.0000 | ORAL_TABLET | Freq: Once | ORAL | Status: AC
  Administered 2023-03-10: 1 via ORAL
  Filled 2023-03-10: qty 1

## 2023-03-10 MED ORDER — IOHEXOL 300 MG/ML  SOLN
100.0000 mL | Freq: Once | INTRAMUSCULAR | Status: AC | PRN
  Administered 2023-03-10: 100 mL via INTRAVENOUS

## 2023-03-10 MED ORDER — IBUPROFEN 800 MG PO TABS: 800.0000 mg | ORAL_TABLET | Freq: Three times a day (TID) | ORAL | 0 refills | Status: DC | PRN

## 2023-03-10 MED ORDER — KETOROLAC TROMETHAMINE 30 MG/ML IJ SOLN
30.0000 mg | Freq: Once | INTRAMUSCULAR | Status: AC
  Administered 2023-03-10: 30 mg via INTRAVENOUS
  Filled 2023-03-10: qty 1

## 2023-03-10 MED ORDER — ACETAMINOPHEN 325 MG PO TABS: 650.0000 mg | ORAL_TABLET | Freq: Four times a day (QID) | ORAL | 0 refills | Status: AC | PRN

## 2023-03-10 MED ORDER — AMOXICILLIN-POT CLAVULANATE 875-125 MG PO TABS: 1.0000 | ORAL_TABLET | Freq: Two times a day (BID) | ORAL | 0 refills | Status: AC

## 2023-03-10 NOTE — ED Notes (Signed)
Patient currently in bathroom attempting to provide urine sample.

## 2023-03-10 NOTE — Discharge Instructions (Addendum)
You have been seen and discharged from the emergency department.  Take antibiotic as directed.  You may take Tylenol/ibuprofen for pain control.  Symptoms should improve over the next 48 hours.  Follow-up bland diet and advance as tolerated.  Follow-up with your primary provider for further evaluation and further care. Take home medications as prescribed. If you have any worsening symptoms, severely worsening pain, high fevers or further concerns for your health please return to an emergency department for further evaluation.

## 2023-03-10 NOTE — ED Notes (Signed)
Pt requested Insurance claims handler.

## 2023-03-10 NOTE — ED Provider Notes (Signed)
[place] [facility] Provider Note   CSN: [id number] Arrival date & time: 03/10/23  4098     History  Chief Complaint  Patient presents with   Flank Pain    Vedanth E Bolinger is a 57 y.o. male.  HPI   57 year old male presents emergency department with left lower abdominal pain.  Patient states that he woke up yesterday with this pain.  Describes it as sharp, tender to the touch.  Sometimes worse with movement.  He has urinary frequency at baseline due to prostate issues but denies any other specific dysuria/hematuria.  No history of kidney stones.  Patient reports decreased bowel movements over the past couple days.  No noted rash in this area.  Denies any other constitutional symptoms.  Home Medications Prior to Admission medications   Medication Sig Start Date End Date Taking? Authorizing Provider  acetaminophen (TYLENOL) 500 MG tablet Take 1 tablet (500 mg total) by mouth every 6 (six) hours as needed. 05/24/16   Bradd Canary, MD  albuterol (VENTOLIN HFA) 108 (90 Base) MCG/ACT inhaler Inhale 2 puffs into the lungs every 4 (four) hours as needed for wheezing or shortness of breath. 08/18/22   Bradd Canary, MD  Alum & Mag Hydroxide-Simeth (MYLANTA PO) Take by mouth as needed.    [provider]  amLODipine (NORVASC) 10 MG tablet Take 1 tab every evening. 07/05/22   Bradd Canary, MD  ascorbic acid (VITAMIN C) 500 MG tablet Take 1 tablet (500 mg total) by mouth daily. 07/05/22   Bradd Canary, MD  Bayer Microlet Lancets lancets Test blood sugars twice daily 08/01/19   Bradd Canary, MD  Blood Glucose Monitoring Suppl (CONTOUR NEXT MONITOR) w/Device KIT 1 kit by Does not apply route 2 (two) times daily. 07/05/22   Bradd Canary, MD  calcium carbonate (TUMS EX) 750 MG chewable tablet Chew 1 tablet by mouth daily as needed for heartburn.    [provider]  carvedilol (COREG) 25 MG tablet TAKE 1 TABLET BY MOUTH TWICE  DAILY  WITH MEALS 09/12/22   Sharlene Dory, DO  famotidine (PEPCID) 40 MG tablet TAKE 1 TABLET BY MOUTH AT  BEDTIME 08/21/22   Pyrtle, Carie Caddy, MD  fluticasone Aleda Grana) 50 MCG/ACT nasal spray Use 2 spray(s) in each nostril once daily 07/05/22   Bradd Canary, MD  glucose blood (CONTOUR NEXT TEST) test strip CHECK BLOOD SUGAR TWICE DAILY 01/11/23   Bradd Canary, MD  losartan (COZAAR) 100 MG tablet Take 1 tablet (100 mg total) by mouth daily. 08/29/22   Bradd Canary, MD  metFORMIN (GLUCOPHAGE) 500 MG tablet Take 1 tablet (500 mg total) by mouth 2 (two) times daily with a meal. 07/05/22   Bradd Canary, MD  Misc. Devices (PULSE OXIMETER FOR FINGER) MISC 1 Device by Does not apply route as needed (SOB, fatigue, headache patient with COVID). 08/01/19   Bradd Canary, MD  Multiple Vitamins-Minerals (MULTIVITAMIN WITH MINERALS) tablet Take 1 tablet by mouth daily. 07/05/22   Bradd Canary, MD  pantoprazole (PROTONIX) 40 MG tablet Take 1 tablet (40 mg total) by mouth 2 (two) times daily before a meal. 07/05/22   Bradd Canary, MD  rosuvastatin (CRESTOR) 40 MG tablet Take 1 tablet (40 mg total) by mouth daily. 07/05/22   Bradd Canary, MD  tadalafil (CIALIS) 5 MG tablet Take 1 tablet (5 mg total) by mouth daily as needed for erectile dysfunction.  07/05/22   Bradd Canary, MD  tamsulosin (FLOMAX) 0.4 MG CAPS capsule Take 1 capsule (0.4 mg total) by mouth daily. 07/05/22   Bradd Canary, MD  tirzepatide [facility]) 2.5 MG/0.5ML Pen Inject 2.5 mg into the skin once a week. 07/05/22   Bradd Canary, MD  tiZANidine (ZANAFLEX) 2 MG tablet Take 0.5-2 tablets (1-4 mg total) by mouth at bedtime as needed for muscle spasms. 07/19/21   Bradd Canary, MD  triamterene-hydrochlorothiazide (MAXZIDE-25) 37.5-25 MG tablet Take 1 tablet by mouth daily. 07/05/22   Bradd Canary, MD  Vitamin D, Ergocalciferol, (DRISDOL) 1.25 MG (50000 UNIT) CAPS capsule Take 1 capsule by mouth once a week for 12 weeks.  07/05/22   Bradd Canary, MD  zinc gluconate 50 MG tablet Take 1 tablet (50 mg total) by mouth daily. Take 50 mg by mouth daily. 07/05/22   Bradd Canary, MD  amitriptyline (ELAVIL) 25 MG tablet Take 1 tablet (25 mg total) by mouth at bedtime. Patient not taking: No sig reported 02/13/19 07/18/19  Pyrtle, Carie Caddy, MD      Allergies    Hydrocodone, Hydromorphone, and Tramadol    Review of Systems   Review of Systems  Constitutional:  Positive for appetite change and fatigue. Negative for fever.  Respiratory:  Negative for shortness of breath.   Cardiovascular:  Negative for chest pain.  Gastrointestinal:  Positive for abdominal pain, constipation and nausea. Negative for diarrhea and vomiting.  Genitourinary:  Positive for frequency. Negative for dysuria, hematuria and testicular pain.  Skin:  Negative for rash.  Neurological:  Negative for headaches.    Physical Exam Updated Vital Signs BP (!) 187/104 (BP Location: Right Arm)   Pulse 77   Temp 98.5 F (36.9 C) (Oral)   Resp 18   Ht 5\' 11"  (1.803 m)   Wt 124.7 kg   SpO2 95%   BMI 38.35 kg/m  Physical Exam Vitals and nursing note reviewed.  Constitutional:      Appearance: Normal appearance.  HENT:     Head: Normocephalic.     Mouth/Throat:     Mouth: Mucous membranes are moist.  Cardiovascular:     Rate and Rhythm: Normal rate.  Pulmonary:     Effort: Pulmonary effort is normal. No respiratory distress.  Abdominal:     Palpations: Abdomen is soft.     Tenderness: There is abdominal tenderness. There is guarding. There is no rebound.     Comments: Point tenderness in the left lower quadrant to deep palpation with voluntary guarding, no overlying skin changes or rash  Skin:    General: Skin is warm.  Neurological:     Mental Status: He is alert and oriented to person, place, and time. Mental status is at baseline.  Psychiatric:        Mood and Affect: Mood normal.     ED Results / Procedures / Treatments    Labs (all labs ordered are listed, but only abnormal results are displayed) Labs Reviewed  BASIC METABOLIC PANEL - Abnormal; Notable for the following components:      Result Value   Calcium 8.8 (*)    All other components within normal limits  URINALYSIS, ROUTINE W REFLEX MICROSCOPIC  CBC WITH DIFFERENTIAL/PLATELET    EKG None  Radiology No results found.  Procedures Procedures    Medications Ordered in ED Medications - No data to display  ED Course/ Medical Decision Making/ A&P  Medical Decision Making Amount and/or Complexity of Data Reviewed Labs: ordered. Radiology: ordered.  Risk Prescription drug management.   57 year old male presents emergency department left lower quadrant abdominal pain.  Vitals show mild hypertension but are otherwise stable.  Patient has reproducible point tenderness in left lower quadrant with voluntary guarding.  Blood work is reassuring with no acute abnormalities but given the degree of pain and reaction on exam CT was pursued.  CT the abdomen pelvis identifies diverticulitis.  Will treat with oral antibiotics and outpatient follow-up.  Patient at this time appears safe and stable for discharge and close outpatient follow up. Discharge plan and strict return to ED precautions discussed, patient verbalizes understanding and agreement.        Final Clinical Impression(s) / ED Diagnoses Final diagnoses:  None    Rx / DC Orders ED Discharge Orders     None         Rozelle Logan, DO 03/10/23 1033

## 2023-03-10 NOTE — ED Triage Notes (Signed)
Pt with LT flank pain that is tender to the touch. Pt reports problems with BM today; a little constipated. Pt took castor oil with no help. No hx of kidney stones. Pt endorses increased frequency that is more than his baseline (hx of prostate issues). No fevers,

## 2023-03-10 NOTE — ED Notes (Signed)

## 2023-06-14 ENCOUNTER — Other Ambulatory Visit (HOSPITAL_COMMUNITY): Payer: Self-pay

## 2023-08-07 ENCOUNTER — Emergency Department (HOSPITAL_BASED_OUTPATIENT_CLINIC_OR_DEPARTMENT_OTHER): Payer: No Typology Code available for payment source

## 2023-08-07 ENCOUNTER — Encounter (HOSPITAL_BASED_OUTPATIENT_CLINIC_OR_DEPARTMENT_OTHER): Payer: Self-pay

## 2023-08-07 ENCOUNTER — Other Ambulatory Visit (HOSPITAL_BASED_OUTPATIENT_CLINIC_OR_DEPARTMENT_OTHER): Payer: Self-pay

## 2023-08-07 ENCOUNTER — Emergency Department (HOSPITAL_BASED_OUTPATIENT_CLINIC_OR_DEPARTMENT_OTHER)
Admission: EM | Admit: 2023-08-07 | Discharge: 2023-08-07 | Disposition: A | Discharge status: Home / Self Care | Payer: No Typology Code available for payment source | Attending: Emergency Medicine | Admitting: Emergency Medicine

## 2023-08-07 DIAGNOSIS — R350 Frequency of micturition: Secondary | ICD-10-CM | POA: Diagnosis not present

## 2023-08-07 DIAGNOSIS — R1031 Right lower quadrant pain: Secondary | ICD-10-CM | POA: Diagnosis not present

## 2023-08-07 DIAGNOSIS — I110 Hypertensive heart disease with heart failure: Secondary | ICD-10-CM | POA: Diagnosis not present

## 2023-08-07 DIAGNOSIS — Z79899 Other long term (current) drug therapy: Secondary | ICD-10-CM | POA: Diagnosis not present

## 2023-08-07 DIAGNOSIS — R109 Unspecified abdominal pain: Secondary | ICD-10-CM

## 2023-08-07 DIAGNOSIS — I509 Heart failure, unspecified: Secondary | ICD-10-CM | POA: Insufficient documentation

## 2023-08-07 DIAGNOSIS — Z7984 Long term (current) use of oral hypoglycemic drugs: Secondary | ICD-10-CM | POA: Insufficient documentation

## 2023-08-07 DIAGNOSIS — E119 Type 2 diabetes mellitus without complications: Secondary | ICD-10-CM | POA: Insufficient documentation

## 2023-08-07 LAB — CBC WITH DIFFERENTIAL/PLATELET
Abs Immature Granulocytes: 0.05 10*3/uL (ref 0.00–0.07)
Basophils Absolute: 0 10*3/uL (ref 0.0–0.1)
Basophils Relative: 0 %
Eosinophils Absolute: 0 10*3/uL (ref 0.0–0.5)
Eosinophils Relative: 0 %
HCT: 42.1 % (ref 39.0–52.0)
Hemoglobin: 14.1 g/dL (ref 13.0–17.0)
Immature Granulocytes: 1 %
Lymphocytes Relative: 20 %
Lymphs Abs: 2.1 10*3/uL (ref 0.7–4.0)
MCH: 28.5 pg (ref 26.0–34.0)
MCHC: 33.5 g/dL (ref 30.0–36.0)
MCV: 85.1 fL (ref 80.0–100.0)
Monocytes Absolute: 0.7 10*3/uL (ref 0.1–1.0)
Monocytes Relative: 6 %
Neutro Abs: 7.7 10*3/uL (ref 1.7–7.7)
Neutrophils Relative %: 73 %
Platelets: 225 10*3/uL (ref 150–400)
RBC: 4.95 MIL/uL (ref 4.22–5.81)
RDW: 14.3 % (ref 11.5–15.5)
WBC: 10.5 10*3/uL (ref 4.0–10.5)
nRBC: 0 % (ref 0.0–0.2)

## 2023-08-07 LAB — URINALYSIS, W/ REFLEX TO CULTURE (INFECTION SUSPECTED)
Bilirubin Urine: NEGATIVE
Glucose, UA: NEGATIVE mg/dL
Hgb urine dipstick: NEGATIVE
Ketones, ur: NEGATIVE mg/dL
Leukocytes,Ua: NEGATIVE
Nitrite: NEGATIVE
Protein, ur: NEGATIVE mg/dL
RBC / HPF: NONE SEEN RBC/hpf (ref 0–5)
Specific Gravity, Urine: 1.025 (ref 1.005–1.030)
Squamous Epithelial / HPF: NONE SEEN /[HPF] (ref 0–5)
pH: 6 (ref 5.0–8.0)

## 2023-08-07 LAB — COMPREHENSIVE METABOLIC PANEL
ALT: 22 U/L (ref 0–44)
AST: 14 U/L — ABNORMAL LOW (ref 15–41)
Albumin: 4 g/dL (ref 3.5–5.0)
Alkaline Phosphatase: 64 U/L (ref 38–126)
Anion gap: 8 (ref 5–15)
BUN: 19 mg/dL (ref 6–20)
CO2: 27 mmol/L (ref 22–32)
Calcium: 9.4 mg/dL (ref 8.9–10.3)
Chloride: 104 mmol/L (ref 98–111)
Creatinine, Ser: 1 mg/dL (ref 0.61–1.24)
GFR, Estimated: >60 mL/min (ref >60)
Glucose, Bld: 117 mg/dL — ABNORMAL HIGH (ref 70–99)
Potassium: 3.6 mmol/L (ref 3.5–5.1)
Sodium: 139 mmol/L (ref 135–145)
Total Bilirubin: 0.4 mg/dL (ref <1.2)
Total Protein: 6.7 g/dL (ref 6.5–8.1)

## 2023-08-07 MED ORDER — SODIUM CHLORIDE 0.9 % IV BOLUS
500.0000 mL | Freq: Once | INTRAVENOUS | Status: AC
  Administered 2023-08-07: 500 mL via INTRAVENOUS

## 2023-08-07 MED ORDER — CYCLOBENZAPRINE HCL 10 MG PO TABS
10.0000 mg | ORAL_TABLET | Freq: Two times a day (BID) | ORAL | 0 refills | Status: AC | PRN
  Filled 2023-08-07: qty 20, 10d supply, fill #0

## 2023-08-07 MED ORDER — ONDANSETRON HCL 4 MG/2ML IJ SOLN
4.0000 mg | Freq: Three times a day (TID) | INTRAMUSCULAR | Status: DC | PRN
  Administered 2023-08-07: 4 mg via INTRAVENOUS
  Filled 2023-08-07: qty 2

## 2023-08-07 MED ORDER — FENTANYL CITRATE PF 50 MCG/ML IJ SOSY
50.0000 ug | PREFILLED_SYRINGE | Freq: Once | INTRAMUSCULAR | Status: AC
  Administered 2023-08-07: 50 ug via INTRAVENOUS
  Filled 2023-08-07: qty 1

## 2023-08-07 MED ORDER — NAPROXEN 375 MG PO TABS
375.0000 mg | ORAL_TABLET | Freq: Two times a day (BID) | ORAL | 0 refills | Status: DC
  Filled 2023-08-07: qty 20, 10d supply, fill #0

## 2023-08-07 NOTE — ED Provider Notes (Signed)
[place] [facility] Provider Note   CSN: [id number] Arrival date & time: 08/07/23  5621     History  Chief Complaint  Patient presents with   Flank Pain    [name] Foster is a 57 y.o. male.   Flank Pain     57 year old male with medical history significant for HLD, HTN, GERD, BPH, diverticulosis, DM2, obesity, HFpEF who presents to the emergency department with urinary frequency and flank pain.  The patient states that he has had frequency for the last several days.  He states that he has developed right lower quadrant abdominal pain with associated right-sided flank pain over the last 2 days.  Pain is moderate in severity, no associated nausea or vomiting, no fevers or chills.  His last bowel movement was yesterday and was normal.  He does endorse some abdominal bloating.  He denies any falls or trauma to his back.  He denies any sensation of incompletely voiding, denies any suprapubic discomfort. He is not concerned for sexually transmitted infection.  Home Medications Prior to Admission medications   Medication Sig Start Date End Date Taking? Authorizing Provider  cyclobenzaprine (FLEXERIL) 10 MG tablet Take 1 tablet (10 mg total) by mouth 2 (two) times daily as needed for muscle spasms. 08/07/23  Yes Ernie Avena, MD  naproxen (NAPROSYN) 375 MG tablet Take 1 tablet (375 mg total) by mouth 2 (two) times daily. 08/07/23  Yes Ernie Avena, MD  acetaminophen (TYLENOL) 325 MG tablet Take 2 tablets (650 mg total) by mouth every 6 (six) hours as needed. 03/10/23   Horton, Clabe Seal, DO  acetaminophen (TYLENOL) 500 MG tablet Take 1 tablet (500 mg total) by mouth every 6 (six) hours as needed. 05/24/16   Bradd Canary, MD  albuterol (VENTOLIN HFA) 108 (90 Base) MCG/ACT inhaler Inhale 2 puffs into the lungs every 4 (four) hours as needed for wheezing or shortness of breath. 08/18/22   Bradd Canary, MD  Alum & Mag Hydroxide-Simeth (MYLANTA PO) Take  by mouth as needed.    [provider]  amLODipine (NORVASC) 10 MG tablet Take 1 tab every evening. 07/05/22   Bradd Canary, MD  amoxicillin-clavulanate (AUGMENTIN) 875-125 MG tablet Take 1 tablet by mouth every 12 (twelve) hours. 03/10/23   Horton, Clabe Seal, DO  ascorbic acid (VITAMIN C) 500 MG tablet Take 1 tablet (500 mg total) by mouth daily. 07/05/22   Bradd Canary, MD  Bayer Microlet Lancets lancets Test blood sugars twice daily 08/01/19   Bradd Canary, MD  Blood Glucose Monitoring Suppl (CONTOUR NEXT MONITOR) w/Device KIT 1 kit by Does not apply route 2 (two) times daily. 07/05/22   Bradd Canary, MD  calcium carbonate (TUMS EX) 750 MG chewable tablet Chew 1 tablet by mouth daily as needed for heartburn.    [provider]  carvedilol (COREG) 25 MG tablet TAKE 1 TABLET BY MOUTH TWICE  DAILY WITH MEALS 09/12/22   Sharlene Dory, DO  famotidine (PEPCID) 40 MG tablet TAKE 1 TABLET BY MOUTH AT  BEDTIME 08/21/22   Pyrtle, Carie Caddy, MD  fluticasone Aleda Grana) 50 MCG/ACT nasal spray Use 2 spray(s) in each nostril once daily 07/05/22   Bradd Canary, MD  glucose blood (CONTOUR NEXT TEST) test strip CHECK BLOOD SUGAR TWICE DAILY 01/11/23   Bradd Canary, MD  ibuprofen (ADVIL) 800 MG tablet Take 1 tablet (800 mg total) by mouth every 8 (eight) hours as needed. 03/10/23  Horton, Kristie M, DO  losartan (COZAAR) 100 MG tablet Take 1 tablet (100 mg total) by mouth daily. 08/29/22   Bradd Canary, MD  metFORMIN (GLUCOPHAGE) 500 MG tablet Take 1 tablet (500 mg total) by mouth 2 (two) times daily with a meal. 07/05/22   Bradd Canary, MD  Misc. Devices (PULSE OXIMETER FOR FINGER) MISC 1 Device by Does not apply route as needed (SOB, fatigue, headache patient with COVID). 08/01/19   Bradd Canary, MD  Multiple Vitamins-Minerals (MULTIVITAMIN WITH MINERALS) tablet Take 1 tablet by mouth daily. 07/05/22   Bradd Canary, MD  pantoprazole (PROTONIX) 40 MG tablet Take 1  tablet (40 mg total) by mouth 2 (two) times daily before a meal. 07/05/22   Bradd Canary, MD  rosuvastatin (CRESTOR) 40 MG tablet Take 1 tablet (40 mg total) by mouth daily. 07/05/22   Bradd Canary, MD  tadalafil (CIALIS) 5 MG tablet Take 1 tablet (5 mg total) by mouth daily as needed for erectile dysfunction. 07/05/22   Bradd Canary, MD  tamsulosin (FLOMAX) 0.4 MG CAPS capsule Take 1 capsule (0.4 mg total) by mouth daily. 07/05/22   Bradd Canary, MD  tirzepatide [facility] 2.5 MG/0.5ML Pen Inject 2.5 mg into the skin once a week. 07/05/22   Bradd Canary, MD  triamterene-hydrochlorothiazide (MAXZIDE-25) 37.5-25 MG tablet Take 1 tablet by mouth daily. 07/05/22   Bradd Canary, MD  Vitamin D, Ergocalciferol, (DRISDOL) 1.25 MG (50000 UNIT) CAPS capsule Take 1 capsule by mouth once a week for 12 weeks. 07/05/22   Bradd Canary, MD  zinc gluconate 50 MG tablet Take 1 tablet (50 mg total) by mouth daily. Take 50 mg by mouth daily. 07/05/22   Bradd Canary, MD  amitriptyline (ELAVIL) 25 MG tablet Take 1 tablet (25 mg total) by mouth at bedtime. Patient not taking: No sig reported 02/13/19 07/18/19  Pyrtle, Carie Caddy, MD      Allergies    Hydrocodone, Hydromorphone, and Tramadol    Review of Systems   Review of Systems  Genitourinary:  Positive for flank pain and frequency.  All other systems reviewed and are negative.   Physical Exam Updated Vital Signs BP (!) 163/96   Pulse (!) 51   Temp 98.6 F (37 C)   Resp 18   Ht 5\' 11"  (1.803 m)   Wt 115.7 kg   SpO2 100%   BMI 35.57 kg/m  Physical Exam Vitals and nursing note reviewed.  Constitutional:      General: He is not in acute distress.    Appearance: He is well-developed.  HENT:     Head: Normocephalic and atraumatic.  Eyes:     Conjunctiva/sclera: Conjunctivae normal.  Cardiovascular:     Rate and Rhythm: Normal rate and regular rhythm.  Pulmonary:     Effort: Pulmonary effort is normal. No respiratory distress.      Breath sounds: Normal breath sounds.  Abdominal:     General: There is distension.     Palpations: Abdomen is soft.     Tenderness: There is abdominal tenderness in the right lower quadrant. There is right CVA tenderness. There is no guarding.  Musculoskeletal:        General: No swelling.     Cervical back: Neck supple.  Skin:    General: Skin is warm and dry.     Capillary Refill: Capillary refill takes less than 2 seconds.  Neurological:     Mental Status: He  is alert.  Psychiatric:        Mood and Affect: Mood normal.     ED Results / Procedures / Treatments   Labs (all labs ordered are listed, but only abnormal results are displayed) Labs Reviewed  URINALYSIS, W/ REFLEX TO CULTURE (INFECTION SUSPECTED) - Abnormal; Notable for the following components:      Result Value   Bacteria, UA RARE (*)    All other components within normal limits  COMPREHENSIVE METABOLIC PANEL - Abnormal; Notable for the following components:   Glucose, Bld 117 (*)    AST 14 (*)    All other components within normal limits  CBC WITH DIFFERENTIAL/PLATELET    EKG None  Radiology CT Renal Stone Study  Result Date: 08/07/2023 CLINICAL DATA:  Abdominal/flank pain, stone suspected. Right flank pain for 2 days. EXAM: CT ABDOMEN AND PELVIS WITHOUT CONTRAST TECHNIQUE: Multidetector CT imaging of the abdomen and pelvis was performed following the standard protocol without IV contrast. RADIATION DOSE REDUCTION: This exam was performed according to the departmental dose-optimization program which includes automated exposure control, adjustment of the mA and/or kV according to patient size and/or use of iterative reconstruction technique. COMPARISON:  CT abdomen and pelvis 03/10/2023 and CT 04/16/2017 FINDINGS: Lower chest: Stable bandlike density in the lingula likely represents small area of scarring. Otherwise, the visualized lung bases are clear. Hepatobiliary: Normal appearance of the liver and  gallbladder. Pancreas: Unremarkable. No pancreatic ductal dilatation or surrounding inflammatory changes. Spleen: Normal in size without focal abnormality. Adrenals/Urinary Tract: Normal adrenal glands. Negative for kidney stones or hydronephrosis. No ureter stones. Normal urinary bladder. Stomach/Bowel: Normal appearance of stomach and small bowel. Scattered colonic diverticula without acute inflammation. No evidence for bowel dilatation or obstruction. Normal appendix. Vascular/Lymphatic: No significant vascular findings are present. No enlarged abdominal or pelvic lymph nodes. Reproductive: Again noted is an enlarged prostate containing calcifications. Prostate measures 6.8 cm in the transverse dimension. Other: Chronic haziness or stranding in the central abdominal mesentery. This is a chronic finding since 2018 and suspect this is post inflammatory in etiology. Again noted is a small right inguinal hernia containing fat. Musculoskeletal: Focal sclerotic density in the L5 left pedicle is stable since 2018. No suspicious osseous lesions. IMPRESSION: 1. No acute abnormality in the abdomen or pelvis. 2. Negative for kidney stones or hydronephrosis. 3. Colonic diverticulosis without acute inflammation. 4. Enlarged prostate. 5. Small right inguinal hernia containing fat. Electronically Signed   By: Richarda Overlie M.D.   On: 08/07/2023 08:28    Procedures Procedures    Medications Ordered in ED Medications  ondansetron [facility]) injection 4 mg (4 mg Intravenous Given 08/07/23 0806)  fentaNYL (SUBLIMAZE) injection 50 mcg (50 mcg Intravenous Given 08/07/23 0806)  sodium chloride 0.9 % bolus 500 mL (0 mLs Intravenous Stopped 08/07/23 0854)    ED Course/ Medical Decision Making/ A&P                                 Medical Decision Making Amount and/or Complexity of Data Reviewed Labs: ordered. Radiology: ordered.  Risk Prescription drug management.    57 year old male with medical history significant  for HLD, HTN, GERD, BPH, diverticulosis, DM2, obesity, HFpEF who presents to the emergency department with urinary frequency and flank pain.  The patient states that he has had frequency for the last several days.  He states that he has developed right lower quadrant abdominal pain with associated right-sided  flank pain over the last 2 days.  Pain is moderate in severity, no associated nausea or vomiting, no fevers or chills.  His last bowel movement was yesterday and was normal.  He does endorse some abdominal bloating.  He denies any falls or trauma to his back.  He denies any sensation of incompletely voiding, denies any suprapubic discomfort. He is not concerned for sexually transmitted infection.  On arrival, the patient was vitally stable, afebrile, not tachycardic or tachypneic, BP 164/189, saturating 96% on room air.  Discal exam revealed mild right lower quadrant tenderness to palpation, mild right-sided CVA tenderness.  Differential diagnosis includes urinary tract infection, developing pyelonephritis, considered nephrolithiasis.  Considered diverticulitis.  Less likely small bowel obstruction, appendicitis, other acute intra-abdominal emergency.  IV access obtained and the patient was administered a small IV fluid bolus, prescribe Zofran as needed, administered fentanyl.  CT Stone: IMPRESSION:  1. No acute abnormality in the abdomen or pelvis.  2. Negative for kidney stones or hydronephrosis.  3. Colonic diverticulosis without acute inflammation.  4. Enlarged prostate.  5. Small right inguinal hernia containing fat.   Labs: Urinalysis without evidence of UTI, remaining labs unremarkable.  Patient's postvoid residual was 43 cc.  No evidence for acute urinary retention at this time.  Symptoms likely musculoskeletal in nature, advised NSAIDs, Flexeril, return precautions provided, overall stable for discharge.   Final Clinical Impression(s) / ED Diagnoses Final diagnoses:  Flank pain     Rx / DC Orders ED Discharge Orders          Ordered    naproxen (NAPROSYN) 375 MG tablet  2 times daily        08/07/23 1100    cyclobenzaprine (FLEXERIL) 10 MG tablet  2 times daily PRN        08/07/23 1100              Ernie Avena, MD 08/07/23 1100

## 2023-08-07 NOTE — Discharge Instructions (Addendum)
Your CT scan was negative for kidney stone or other acute abnormality.  Your urine was negative for urinary tract infection and your bladder scan did not show evidence of urinary retention.   Try NSAIDs, over-the-counter lidocaine patch and Flexeril for pain control.

## 2023-08-07 NOTE — ED Triage Notes (Signed)
C/o right flank pain x 2 days. States worse with movement and radiates to left lower back. Denies abdominal pain, N/V. States urinary frequency. Denies known injury.

## 2023-08-16 ENCOUNTER — Encounter: Payer: Self-pay | Admitting: Podiatry

## 2023-08-16 ENCOUNTER — Ambulatory Visit (INDEPENDENT_AMBULATORY_CARE_PROVIDER_SITE_OTHER): Payer: No Typology Code available for payment source | Admitting: Podiatry

## 2023-08-16 ENCOUNTER — Ambulatory Visit (INDEPENDENT_AMBULATORY_CARE_PROVIDER_SITE_OTHER): Payer: No Typology Code available for payment source

## 2023-08-16 VITALS — Ht 71.0 in | Wt 255.0 lb

## 2023-08-16 DIAGNOSIS — M79671 Pain in right foot: Secondary | ICD-10-CM

## 2023-08-16 DIAGNOSIS — M79672 Pain in left foot: Secondary | ICD-10-CM

## 2023-08-16 DIAGNOSIS — M722 Plantar fascial fibromatosis: Secondary | ICD-10-CM

## 2023-08-16 MED ORDER — TRIAMCINOLONE ACETONIDE 10 MG/ML IJ SUSP
10.0000 mg | Freq: Once | INTRAMUSCULAR | Status: AC
Start: 2023-08-16 — End: 2023-08-16
  Administered 2023-08-16: 10 mg via INTRA_ARTICULAR

## 2023-08-16 MED ORDER — DICLOFENAC SODIUM 75 MG PO TBEC: 75.0000 mg | DELAYED_RELEASE_TABLET | Freq: Two times a day (BID) | ORAL | 2 refills | Status: AC

## 2023-08-16 NOTE — Progress Notes (Signed)
Subjective:   Patient ID: [name] Foster, male   DOB: 57 y.o.   MRN: [id number]   HPI Patient presents stating that he has had a lot of pain in his left arch and did get new shoes 2 months ago.  He is working a job where he still is on his feet quite a bit and states that this just started in the last couple months   ROS      Objective:  Physical Exam  Neurovascular status intact muscle strength was found to be adequate range of motion adequate with the patient noted to have exquisite discomfort in the mid arch area left moderate depression of the arch and is wearing new shoes at this time.  Good digital perfusion well-oriented     Assessment:  Acute plantar fasciitis mid arch area left .  Endoscopic release so I do not think it is completely the tendon itself     Plan:  Reviewed condition and x-ray.  Today I went ahead and I injected the mid arch left 3 mg Kenalog 5 mg Xylocaine discussed spur formation and may require other treatments.  I went ahead today instructed on stretching exercises and shoe gear modifications and applied fascial brace to lift up the arch and take pressure off the plantar fascia.  Reappoint to recheck 2 weeks placed on diclofenac 75 mg twice daily  X-rays indicate large posterior spur left history of posterior spur removal right endoscopic surgery left that did well

## 2023-08-16 NOTE — Patient Instructions (Signed)

## 2023-08-30 ENCOUNTER — Ambulatory Visit: Payer: No Typology Code available for payment source | Admitting: Podiatry

## 2023-09-10 MED ORDER — OXYCODONE-ACETAMINOPHEN 10-325 MG PO TABS: 1.0000 | ORAL_TABLET | ORAL | 0 refills | Status: AC | PRN

## 2023-09-10 NOTE — Addendum Note (Signed)
Addended by: Lenn Sink on: 09/10/2023 12:42 PM   Modules accepted: Orders

## 2023-09-11 ENCOUNTER — Other Ambulatory Visit: Payer: Self-pay | Admitting: Podiatry

## 2023-09-19 NOTE — Telephone Encounter (Signed)
I saw him

## 2023-10-01 ENCOUNTER — Encounter: Payer: Self-pay | Admitting: Podiatry

## 2023-10-01 ENCOUNTER — Ambulatory Visit (INDEPENDENT_AMBULATORY_CARE_PROVIDER_SITE_OTHER): Payer: No Typology Code available for payment source | Admitting: Podiatry

## 2023-10-01 DIAGNOSIS — E1140 Type 2 diabetes mellitus with diabetic neuropathy, unspecified: Secondary | ICD-10-CM | POA: Diagnosis not present

## 2023-10-01 DIAGNOSIS — E1149 Type 2 diabetes mellitus with other diabetic neurological complication: Secondary | ICD-10-CM

## 2023-10-01 DIAGNOSIS — M79609 Pain in unspecified limb: Secondary | ICD-10-CM | POA: Diagnosis not present

## 2023-10-01 DIAGNOSIS — B351 Tinea unguium: Secondary | ICD-10-CM | POA: Diagnosis not present

## 2023-10-02 NOTE — Progress Notes (Signed)
Subjective:   Patient ID: [name] Foster, male   DOB: 58 y.o.   MRN: [id number]   HPI Patient presents stating he is having a lot of problems with his nails with his diabetes and neurological issue.  States they have been thick and they are painful all nailbeds both feet neuro vas   ROS      Objective:  Physical Exam  Scaler status intact with thick yellow brittle nailbeds 1-5 both feet diminishment of neurological sensation bilateral moderate obesity long-term diabetes     Assessment:  Mycotic nail infection 1-5 both feet with discomfort and pathology associated with     Plan:  H&P reviewed and discussed daily foot inspections and debrided nailbeds 1-5 both feet no iatrogenic bleeding reappoint routine care

## 2023-10-02 NOTE — Telephone Encounter (Signed)
He seems stable at this time

## 2023-10-03 NOTE — Telephone Encounter (Signed)
We are good with him. I don't think he needs any other pain med

## 2023-10-16 ENCOUNTER — Other Ambulatory Visit: Payer: Self-pay | Admitting: Podiatry

## 2023-11-30 ENCOUNTER — Ambulatory Visit: Admitting: Podiatry

## 2024-01-01 ENCOUNTER — Other Ambulatory Visit (HOSPITAL_BASED_OUTPATIENT_CLINIC_OR_DEPARTMENT_OTHER): Payer: Self-pay

## 2024-01-01 ENCOUNTER — Emergency Department (HOSPITAL_BASED_OUTPATIENT_CLINIC_OR_DEPARTMENT_OTHER)

## 2024-01-01 ENCOUNTER — Other Ambulatory Visit: Payer: Self-pay

## 2024-01-01 ENCOUNTER — Encounter (HOSPITAL_BASED_OUTPATIENT_CLINIC_OR_DEPARTMENT_OTHER): Payer: Self-pay | Admitting: Emergency Medicine

## 2024-01-01 ENCOUNTER — Emergency Department (HOSPITAL_BASED_OUTPATIENT_CLINIC_OR_DEPARTMENT_OTHER)
Admission: EM | Admit: 2024-01-01 | Discharge: 2024-01-01 | Disposition: A | Discharge status: Home / Self Care | Attending: Emergency Medicine | Admitting: Emergency Medicine

## 2024-01-01 DIAGNOSIS — Z79899 Other long term (current) drug therapy: Secondary | ICD-10-CM | POA: Diagnosis not present

## 2024-01-01 DIAGNOSIS — Z7984 Long term (current) use of oral hypoglycemic drugs: Secondary | ICD-10-CM | POA: Diagnosis not present

## 2024-01-01 DIAGNOSIS — R0789 Other chest pain: Secondary | ICD-10-CM | POA: Insufficient documentation

## 2024-01-01 DIAGNOSIS — E119 Type 2 diabetes mellitus without complications: Secondary | ICD-10-CM | POA: Diagnosis not present

## 2024-01-01 DIAGNOSIS — I10 Essential (primary) hypertension: Secondary | ICD-10-CM | POA: Insufficient documentation

## 2024-01-01 DIAGNOSIS — R030 Elevated blood-pressure reading, without diagnosis of hypertension: Secondary | ICD-10-CM

## 2024-01-01 LAB — CBC
HCT: 41.7 % (ref 39.0–52.0)
Hemoglobin: 14.3 g/dL (ref 13.0–17.0)
MCH: 28.9 pg (ref 26.0–34.0)
MCHC: 34.3 g/dL (ref 30.0–36.0)
MCV: 84.2 fL (ref 80.0–100.0)
Platelets: 191 10*3/uL (ref 150–400)
RBC: 4.95 MIL/uL (ref 4.22–5.81)
RDW: 14 % (ref 11.5–15.5)
WBC: 3.9 10*3/uL — ABNORMAL LOW (ref 4.0–10.5)
nRBC: 0 % (ref 0.0–0.2)

## 2024-01-01 LAB — COMPREHENSIVE METABOLIC PANEL WITH GFR
ALT: 25 U/L (ref 0–44)
AST: 18 U/L (ref 15–41)
Albumin: 4.3 g/dL (ref 3.5–5.0)
Alkaline Phosphatase: 53 U/L (ref 38–126)
Anion gap: 10 (ref 5–15)
BUN: 11 mg/dL (ref 6–20)
CO2: 25 mmol/L (ref 22–32)
Calcium: 9.3 mg/dL (ref 8.9–10.3)
Chloride: 106 mmol/L (ref 98–111)
Creatinine, Ser: 0.92 mg/dL (ref 0.61–1.24)
GFR, Estimated: >60 mL/min (ref >60)
Glucose, Bld: 89 mg/dL (ref 70–99)
Potassium: 3.6 mmol/L (ref 3.5–5.1)
Sodium: 141 mmol/L (ref 135–145)
Total Bilirubin: 0.5 mg/dL (ref 0.0–1.2)
Total Protein: 7 g/dL (ref 6.5–8.1)

## 2024-01-01 LAB — TROPONIN T, HIGH SENSITIVITY: Troponin T High Sensitivity: <15 ng/L (ref <19)

## 2024-01-01 MED ORDER — CYCLOBENZAPRINE HCL 5 MG PO TABS
5.0000 mg | ORAL_TABLET | Freq: Once | ORAL | Status: AC
  Administered 2024-01-01: 5 mg via ORAL
  Filled 2024-01-01: qty 1

## 2024-01-01 MED ORDER — LEVOCETIRIZINE DIHYDROCHLORIDE 5 MG PO TABS
5.0000 mg | ORAL_TABLET | Freq: Every evening | ORAL | 0 refills | Status: AC
  Filled 2024-01-01: qty 30, 30d supply, fill #0

## 2024-01-01 MED ORDER — NAPROXEN 500 MG PO TABS
500.0000 mg | ORAL_TABLET | Freq: Two times a day (BID) | ORAL | 0 refills | Status: AC
  Filled 2024-01-01: qty 30, 15d supply, fill #0

## 2024-01-01 MED ORDER — KETOROLAC TROMETHAMINE 15 MG/ML IJ SOLN
15.0000 mg | Freq: Once | INTRAMUSCULAR | Status: AC
  Administered 2024-01-01: 15 mg via INTRAMUSCULAR
  Filled 2024-01-01: qty 1

## 2024-01-01 MED ORDER — GUAIFENESIN 100 MG/5ML PO LIQD
100.0000 mg | ORAL | 0 refills | Status: AC | PRN
  Filled 2024-01-01: qty 118, 2d supply, fill #0

## 2024-01-01 NOTE — ED Triage Notes (Signed)
 Had a cold 3 weeks had cp then it went away and now it is back

## 2024-01-01 NOTE — Discharge Instructions (Addendum)
 Workup here today is overall reassuring.  I would recommend taking a daily allergy medication for the next 2 to 4 weeks.  I would recommend taking Xyzal  or Claritin make sure you are taking this daily.  You can also continue using the Flonase .  Take cough medicine as needed for cough.   I would recommend taking naproxen  once the morning and once in the evening every day, make sure you take this medicine with food.  You can also take Tylenol  1000 mg every 6 hours.  Can use ice or heat over area of pain.  Call primary care to schedule an appointment for follow-up.  Return with new or worsening symptoms.

## 2024-01-01 NOTE — ED Provider Notes (Signed)
 [facility] Provider Note   CSN: [id number] Arrival date & time: 01/01/24  1045     History  Chief Complaint  Patient presents with   Chest Pain    [name] Foster is a 58 y.o. male with past medical history of hyperlipidemia, hypertension, obesity, type 2 diabetes presenting to emergency room with complaint of chest pain.  Patient reports that he has had intermittent chest pain for the past 2 weeks seems to be constant last 3 days.  Reports chest pain is worse with coughing and taking deep breath and.  Chest pain is located in the center of his chest and seems to radiate to his back he describes the chest pain as a chest tightness.  He reports he has had recent viral illness in which she was given antibiotics and prednisone  he did have some relief but symptoms seem to return.  He also has associated dry itchy eyes, congestion, productive cough.  Denies any wheezing or shortness of breath.  Does not have a smoking history.  No COPD or asthma. No CAD.   Chest Pain      Home Medications Prior to Admission medications   Medication Sig Start Date End Date Taking? Authorizing Provider  acetaminophen  (TYLENOL ) 325 MG tablet Take 2 tablets (650 mg total) by mouth every 6 (six) hours as needed. 03/10/23   Horton, Sidra Dredge, DO  acetaminophen  (TYLENOL ) 500 MG tablet Take 1 tablet (500 mg total) by mouth every 6 (six) hours as needed. 05/24/16   Neda Balk, MD  albuterol  (VENTOLIN  HFA) 108 [phone] Base) MCG/ACT inhaler Inhale 2 puffs into the lungs every 4 (four) hours as needed for wheezing or shortness of breath. 08/18/22   Neda Balk, MD  Alum & Mag Hydroxide-Simeth (MYLANTA PO) Take by mouth as needed.    [provider]  amLODipine  (NORVASC ) 10 MG tablet Take 1 tab every evening. 07/05/22   Neda Balk, MD  amoxicillin -clavulanate (AUGMENTIN ) 875-125 MG tablet Take 1 tablet by mouth every 12 (twelve) hours. 03/10/23   Horton,  Sidra Dredge, DO  ascorbic acid  (VITAMIN C) 500 MG tablet Take 1 tablet (500 mg total) by mouth daily. 07/05/22   Neda Balk, MD  Bayer Microlet Lancets lancets Test blood sugars twice daily 08/01/19   Neda Balk, MD  Blood Glucose Monitoring Suppl (CONTOUR NEXT MONITOR) w/Device KIT 1 kit by Does not apply route 2 (two) times daily. 07/05/22   Neda Balk, MD  calcium  carbonate (TUMS EX) 750 MG chewable tablet Chew 1 tablet by mouth daily as needed for heartburn.    [provider]  carvedilol  (COREG ) 25 MG tablet TAKE 1 TABLET BY MOUTH TWICE  DAILY WITH MEALS 09/12/22   Wendling, Shellie Dials, DO  cyclobenzaprine  (FLEXERIL ) 10 MG tablet Take 1 tablet (10 mg total) by mouth 2 (two) times daily as needed for muscle spasms. 08/07/23   Rosealee Concha, MD  diclofenac  (VOLTAREN ) 75 MG EC tablet Take 1 tablet (75 mg total) by mouth 2 (two) times daily. 08/16/23   Brandt Cake, DPM  famotidine  (PEPCID ) 40 MG tablet TAKE 1 TABLET BY MOUTH AT  BEDTIME 08/21/22   Pyrtle, Amber Bail, MD  fluticasone  (FLONASE ) 50 MCG/ACT nasal spray Use 2 spray(s) in each nostril once daily 07/05/22   Neda Balk, MD  glucose blood (CONTOUR NEXT TEST) test strip CHECK BLOOD SUGAR TWICE DAILY 01/11/23   Neda Balk, MD  ibuprofen  (  ADVIL ) 800 MG tablet TAKE ONE TABLET BY MOUTH EVERY 8 HOURS AS NEEDED 10/17/23   Brandt Cake, DPM  losartan  (COZAAR ) 100 MG tablet Take 1 tablet (100 mg total) by mouth daily. 08/29/22   Neda Balk, MD  metFORMIN  (GLUCOPHAGE ) 500 MG tablet Take 1 tablet (500 mg total) by mouth 2 (two) times daily with a meal. 07/05/22   Neda Balk, MD  Misc. Devices (PULSE OXIMETER FOR FINGER) MISC 1 Device by Does not apply route as needed (SOB, fatigue, headache patient with COVID). 08/01/19   Neda Balk, MD  Multiple Vitamins-Minerals (MULTIVITAMIN WITH MINERALS) tablet Take 1 tablet by mouth daily. 07/05/22   Neda Balk, MD  naproxen  (NAPROSYN ) 375 MG tablet Take 1 tablet  (375 mg total) by mouth 2 (two) times daily. 08/07/23   Rosealee Concha, MD  pantoprazole  (PROTONIX ) 40 MG tablet Take 1 tablet (40 mg total) by mouth 2 (two) times daily before a meal. 07/05/22   Neda Balk, MD  rosuvastatin  (CRESTOR ) 40 MG tablet Take 1 tablet (40 mg total) by mouth daily. 07/05/22   Neda Balk, MD  tadalafil  (CIALIS ) 5 MG tablet Take 1 tablet (5 mg total) by mouth daily as needed for erectile dysfunction. 07/05/22   Neda Balk, MD  tamsulosin  (FLOMAX ) 0.4 MG CAPS capsule Take 1 capsule (0.4 mg total) by mouth daily. 07/05/22   Neda Balk, MD  tirzepatide  (MOUNJARO ) 2.5 MG/0.5ML Pen Inject 2.5 mg into the skin once a week. 07/05/22   Neda Balk, MD  triamterene -hydrochlorothiazide  (MAXZIDE -25) 37.5-25 MG tablet Take 1 tablet by mouth daily. 07/05/22   Neda Balk, MD  Vitamin D , Ergocalciferol , (DRISDOL ) 1.25 MG (50000 UNIT) CAPS capsule Take 1 capsule by mouth once a week for 12 weeks. 07/05/22   Neda Balk, MD  zinc  gluconate 50 MG tablet Take 1 tablet (50 mg total) by mouth daily. Take 50 mg by mouth daily. 07/05/22   Neda Balk, MD  amitriptyline  (ELAVIL ) 25 MG tablet Take 1 tablet (25 mg total) by mouth at bedtime. Patient not taking: Reported on 07/14/2019 02/13/19 07/18/19  Nannette Babe, MD      Allergies    Hydrocodone , Hydromorphone , and Tramadol     Review of Systems   Review of Systems  Cardiovascular:  Positive for chest pain.    Physical Exam Updated Vital Signs BP (!) 157/96 (BP Location: Left Arm)   Pulse 74   Temp 98.1 F (36.7 C) (Oral)   Resp 16   Ht 6' (1.829 m)   Wt 113.4 kg   SpO2 97%   BMI 33.91 kg/m  Physical Exam Vitals and nursing note reviewed.  Constitutional:      General: He is not in acute distress.    Appearance: He is not toxic-appearing.  HENT:     Head: Normocephalic and atraumatic.  Eyes:     General: No scleral icterus.    Conjunctiva/sclera: Conjunctivae normal.  Cardiovascular:      Rate and Rhythm: Normal rate and regular rhythm.     Pulses: Normal pulses.     Heart sounds: Normal heart sounds.  Pulmonary:     Effort: Pulmonary effort is normal. No respiratory distress.     Breath sounds: Normal breath sounds. No wheezing.  Chest:     Chest wall: Tenderness present.  Abdominal:     General: Abdomen is flat. Bowel sounds are normal.     Palpations: Abdomen is soft.  Tenderness: There is no abdominal tenderness.  Musculoskeletal:     Right lower leg: No edema.     Left lower leg: No edema.  Skin:    General: Skin is warm and dry.     Findings: No lesion.  Neurological:     General: No focal deficit present.     Mental Status: He is alert and oriented to person, place, and time. Mental status is at baseline.     ED Results / Procedures / Treatments   Labs (all labs ordered are listed, but only abnormal results are displayed) Labs Reviewed  CBC - Abnormal; Notable for the following components:      Result Value   WBC 3.9 (*)    All other components within normal limits  COMPREHENSIVE METABOLIC PANEL WITH GFR  TROPONIN T, HIGH SENSITIVITY  TROPONIN T, HIGH SENSITIVITY    EKG None  Radiology DG Chest 2 View Result Date: 01/01/2024 CLINICAL DATA:  Chest pain EXAM: CHEST - 2 VIEW COMPARISON:  CT abdomen and pelvis October 09, 2022 FINDINGS: The heart size and mediastinal contours are within normal limits. Both lungs are clear. The visualized skeletal structures are unremarkable. IMPRESSION: No active cardiopulmonary disease. Limited basilar hypoventilatory changes likely related to poor inspiratory volume Electronically Signed   By: Fredrich Jefferson M.D.   On: 01/01/2024 12:27    Procedures Procedures    Medications Ordered in ED Medications - No data to display  ED Course/ Medical Decision Making/ A&P                                 Medical Decision Making Amount and/or Complexity of Data Reviewed Labs: ordered. Radiology: ordered.   This  patient presents to the ED for concern of CP, this involves an extensive number of treatment options, and is a complaint that carries with it a high risk of complications and morbidity.  The differential diagnosis includes PE, CHF, gastritis, GERD, aortic dissection, pna, pneumothorax    Co morbidities that complicate the patient evaluation  HTN, HLD, DM   Additional history obtained:  Additional history obtained from 12/31/23   Lab Tests:  I personally interpreted labs.  The pertinent results include:   CBC without leukocytosis, no anemia. CMP unremarkable.    Imaging Studies ordered:  I ordered imaging studies including chest x-ray  I independently visualized and interpreted imaging which showed no acute findings  I agree with the radiologist interpretation   Cardiac Monitoring: / EKG:  The patient was maintained on a cardiac monitor.  I personally viewed and interpreted the cardiac monitored which showed an underlying rhythm of: sinus with nonspecific ST changes    Problem List / ED Course / Critical interventions / Medication management  Reporting to emergency room with chest pain.  This is in the center of his chest.  Has been present for several days.  He is hemodynamically stable and well-appearing.  His lungs are clear to auscultation bilaterally he is not hypoxic.  He has no sign of pneumonia or pneumothorax on chest x-ray.  Troponin and EKG reassuring thus doubt ACS as cause of chest pain.  No sign of fluid overload thus doubt CHF, pericarditis or myocarditis.  Symptoms are not consistent with aortic dissection.  He has no unilateral calf swelling and no history of DVT PE.  Labs are reassuring without leukocytosis and no anemia.  On exam he does have reproducible chest wall pain.  Will given Toradol  and reassess.  Patient feeling better, sent medications to pharmacy. Given return and follow up.   I ordered medication including Toradol  for pain Reevaluation of the patient  after these medicines showed that the patient improved I have reviewed the patients home medicines and have made adjustments as needed   Plan  F/u w/ PCP in 2-3d to ensure resolution of sx.  Patient was given return precautions. Patient stable for discharge at this time.  Patient educated on sx/dx and verbalized understanding of plan. Return to ER w/ new or worsening sx.          Final Clinical Impression(s) / ED Diagnoses Final diagnoses:  Atypical chest pain  Elevated blood pressure reading    Rx / DC Orders ED Discharge Orders     None         Eudora Heron, PA-C 01/01/24 1324    Mordecai Applebaum, MD 01/01/24 1418

## 2024-01-03 ENCOUNTER — Other Ambulatory Visit (HOSPITAL_COMMUNITY): Payer: Self-pay

## 2024-04-09 ENCOUNTER — Telehealth: Payer: Self-pay | Admitting: Internal Medicine

## 2024-04-09 NOTE — Telephone Encounter (Signed)
 Asuncion Kuba from [facility] called in regards to this patient stating that on the 28 th of July they sent over a medical release form in order for us  to send this patient PCP last colonoscopy report. They would like us  to send it to [phone]. And a good call back number is [phone]. Please advise.

## 2024-04-09 NOTE — Telephone Encounter (Signed)
 Colon report from 01/31/2016 faxed through epic as requested.

## 2024-05-13 ENCOUNTER — Telehealth: Payer: Self-pay | Admitting: Podiatry

## 2024-05-13 NOTE — Telephone Encounter (Signed)
 Contacted patient to verify appointment cancellation- pt answered call, I said hello, call was dropped after. Called again to try to speak with patient but was unable to reach/leave message.

## 2024-05-15 ENCOUNTER — Ambulatory Visit: Admitting: Podiatry

## 2024-05-28 ENCOUNTER — Ambulatory Visit: Admitting: Podiatry

## 2024-05-28 ENCOUNTER — Ambulatory Visit (INDEPENDENT_AMBULATORY_CARE_PROVIDER_SITE_OTHER)

## 2024-05-28 ENCOUNTER — Encounter: Payer: Self-pay | Admitting: Podiatry

## 2024-05-28 DIAGNOSIS — M76821 Posterior tibial tendinitis, right leg: Secondary | ICD-10-CM | POA: Diagnosis not present

## 2024-05-28 DIAGNOSIS — M25571 Pain in right ankle and joints of right foot: Secondary | ICD-10-CM | POA: Diagnosis not present

## 2024-05-28 DIAGNOSIS — M25572 Pain in left ankle and joints of left foot: Secondary | ICD-10-CM | POA: Diagnosis not present

## 2024-05-28 DIAGNOSIS — M76822 Posterior tibial tendinitis, left leg: Secondary | ICD-10-CM

## 2024-05-28 MED ORDER — TRIAMCINOLONE ACETONIDE 10 MG/ML IJ SUSP
10.0000 mg | Freq: Once | INTRAMUSCULAR | Status: AC
  Administered 2024-05-28: 10 mg via INTRA_ARTICULAR

## 2024-05-28 MED ORDER — DICLOFENAC SODIUM 75 MG PO TBEC: 75.0000 mg | DELAYED_RELEASE_TABLET | Freq: Two times a day (BID) | ORAL | 2 refills | Status: AC

## 2024-05-28 NOTE — Progress Notes (Signed)
 Subjective:   Patient ID: [name] Foster, male   DOB: 58 y.o.   MRN: [id number]   HPI Patient states doing very well with the heel at this time but has a lot of discomfort in the medial side of the ankle left over the right stating that he now is working a weightbearing job and on his feet a lot.  Patient does not remember any other changes this has been going on for several months   ROS      Objective:  Physical Exam  Neurovascular status intact with inflammation pain of the posterior tibial tendon bilateral with moderate depression of the arch noted and history of fascial and Achilles tendon surgery     Assessment:  Posterior tibial tendinitis left over right with the probability of a injury pattern which created the pathology     Plan:  H&P reviewed today I went ahead did sheath injection of the posterior tib as it comes under the medial malleolus bilateral.  Applied fascial brace left instructed on reduced activity reappoint in the next 3 weeks may require new orthotics or other treatment  X-rays indicate moderate depression of the arch no other indication of pathology

## 2024-06-11 ENCOUNTER — Ambulatory Visit: Admitting: Podiatry

## 2024-07-28 ENCOUNTER — Telehealth: Payer: Self-pay

## 2024-07-28 NOTE — Telephone Encounter (Signed)
 Copied from CRM #[id number]. Topic: General - Other >> Jul 28, 2024  2:05 PM Deaijah H wrote: Reason for CRM: Patient called in wanting information on previous eye doctor he was referred to. Did not see in referrals and only one message in chart regarding eye exam, but unsure if that's correct. He would like for Dr. Elisabeth nurse to give a call and provide information if possible.

## 2024-07-28 NOTE — Telephone Encounter (Signed)
 Spoke w/ Pt- he requests I send eye doctor information to him via Mychart
